# Patient Record
Sex: Female | Born: 1955 | Race: White | Hispanic: No | Marital: Married | Smoking: Former smoker
Health system: Southern US, Community
[De-identification: ages and names within clinical notes are randomized; demographics above are authoritative.]

## PROBLEM LIST (undated history)

## (undated) ENCOUNTER — Emergency Department (HOSPITAL_COMMUNITY): Payer: Medicare HMO | Source: Home / Self Care

## (undated) DIAGNOSIS — M549 Dorsalgia, unspecified: Secondary | ICD-10-CM

## (undated) DIAGNOSIS — I619 Nontraumatic intracerebral hemorrhage, unspecified: Secondary | ICD-10-CM

## (undated) DIAGNOSIS — R51 Headache: Secondary | ICD-10-CM

## (undated) DIAGNOSIS — I10 Essential (primary) hypertension: Secondary | ICD-10-CM

## (undated) DIAGNOSIS — R079 Chest pain, unspecified: Secondary | ICD-10-CM

## (undated) DIAGNOSIS — M1990 Unspecified osteoarthritis, unspecified site: Secondary | ICD-10-CM

## (undated) DIAGNOSIS — I639 Cerebral infarction, unspecified: Secondary | ICD-10-CM

## (undated) DIAGNOSIS — G4730 Sleep apnea, unspecified: Secondary | ICD-10-CM

## (undated) DIAGNOSIS — N200 Calculus of kidney: Secondary | ICD-10-CM

## (undated) HISTORY — PX: LITHOTRIPSY: SUR834

## (undated) HISTORY — PX: ABDOMINAL HYSTERECTOMY: SHX81

## (undated) HISTORY — PX: FEMUR SURGERY: SHX943

## (undated) HISTORY — PX: BACK SURGERY: SHX140

## (undated) HISTORY — PX: TUBAL LIGATION: SHX77

---

## 1999-04-26 ENCOUNTER — Encounter: Payer: Self-pay | Admitting: Specialist

## 1999-04-26 ENCOUNTER — Ambulatory Visit (HOSPITAL_COMMUNITY): Admission: RE | Admit: 1999-04-26 | Discharge: 1999-04-26 | Payer: Self-pay | Admitting: Specialist

## 1999-06-07 ENCOUNTER — Observation Stay (HOSPITAL_COMMUNITY): Admission: RE | Admit: 1999-06-07 | Discharge: 1999-06-08 | Payer: Self-pay | Admitting: Specialist

## 1999-06-07 ENCOUNTER — Encounter: Payer: Self-pay | Admitting: Specialist

## 2000-01-15 ENCOUNTER — Emergency Department (HOSPITAL_COMMUNITY): Admission: EM | Admit: 2000-01-15 | Discharge: 2000-01-15 | Payer: Self-pay | Admitting: Emergency Medicine

## 2000-01-16 ENCOUNTER — Encounter: Payer: Self-pay | Admitting: Emergency Medicine

## 2000-01-16 ENCOUNTER — Ambulatory Visit (HOSPITAL_COMMUNITY): Admission: RE | Admit: 2000-01-16 | Discharge: 2000-01-16 | Payer: Self-pay | Admitting: Emergency Medicine

## 2000-03-18 ENCOUNTER — Emergency Department (HOSPITAL_COMMUNITY): Admission: EM | Admit: 2000-03-18 | Discharge: 2000-03-19 | Payer: Self-pay | Admitting: Emergency Medicine

## 2000-03-19 ENCOUNTER — Encounter: Payer: Self-pay | Admitting: Emergency Medicine

## 2000-03-19 ENCOUNTER — Emergency Department (HOSPITAL_COMMUNITY): Admission: EM | Admit: 2000-03-19 | Discharge: 2000-03-19 | Payer: Self-pay | Admitting: Emergency Medicine

## 2000-03-19 ENCOUNTER — Encounter: Payer: Self-pay | Admitting: Surgery

## 2002-04-15 HISTORY — PX: OTHER SURGICAL HISTORY: SHX169

## 2003-03-08 ENCOUNTER — Inpatient Hospital Stay (HOSPITAL_COMMUNITY): Admission: RE | Admit: 2003-03-08 | Discharge: 2003-03-09 | Payer: Self-pay | Admitting: Specialist

## 2003-10-10 ENCOUNTER — Emergency Department (HOSPITAL_COMMUNITY): Admission: EM | Admit: 2003-10-10 | Discharge: 2003-10-10 | Payer: Self-pay | Admitting: Emergency Medicine

## 2003-10-16 ENCOUNTER — Emergency Department (HOSPITAL_COMMUNITY): Admission: EM | Admit: 2003-10-16 | Discharge: 2003-10-16 | Payer: Self-pay | Admitting: Emergency Medicine

## 2003-12-13 ENCOUNTER — Other Ambulatory Visit: Admission: RE | Admit: 2003-12-13 | Discharge: 2003-12-13 | Payer: Self-pay | Admitting: *Deleted

## 2004-02-28 ENCOUNTER — Encounter (INDEPENDENT_AMBULATORY_CARE_PROVIDER_SITE_OTHER): Payer: Self-pay | Admitting: *Deleted

## 2004-02-28 ENCOUNTER — Observation Stay (HOSPITAL_COMMUNITY): Admission: RE | Admit: 2004-02-28 | Discharge: 2004-02-29 | Payer: Self-pay | Admitting: Gynecology

## 2004-04-10 ENCOUNTER — Emergency Department (HOSPITAL_COMMUNITY): Admission: EM | Admit: 2004-04-10 | Discharge: 2004-04-11 | Payer: Self-pay | Admitting: Emergency Medicine

## 2004-04-15 HISTORY — PX: OTHER SURGICAL HISTORY: SHX169

## 2008-02-28 ENCOUNTER — Emergency Department (HOSPITAL_BASED_OUTPATIENT_CLINIC_OR_DEPARTMENT_OTHER): Admission: EM | Admit: 2008-02-28 | Discharge: 2008-02-28 | Payer: Self-pay | Admitting: Emergency Medicine

## 2010-08-31 NOTE — Discharge Summary (Signed)
NAME:  Heidi [name], Heidi [name]                   ACCOUNT NO.:  [id number]   MEDICAL RECORD NO.:  [id number]          PATIENT TYPE:  OBV   LOCATION:  9311                          FACILITY:  [facility]   PHYSICIAN:  Ivor Costa. Farrel Gobble, M.D. DATE OF BIRTH:  08-05-1955   DATE OF ADMISSION:  02/28/2004  DATE OF DISCHARGE:  02/29/2004                                 DISCHARGE SUMMARY   PRINCIPAL DIAGNOSIS:  Menometrorrhagia.   ADDITIONAL DIAGNOSIS:  Dysfunctional bleeding.   PRINCIPAL PROCEDURE:  Total vaginal hysterectomy.   HISTORY OF PRESENT ILLNESS:  The patient was a 55 year old G5 P2 with a  history of menometrorrhagia that presented after a negative evaluation for  definitive therapy in the form of a vaginal hysterectomy.  She presented on  the afternoon of February 28, 2004 and underwent a total vaginal  hysterectomy under general anesthesia.  Estimated blood loss was  approximated at 100 mL.  The findings were a bulky uterus with an  endocervical polyp; otherwise, was unremarkable.  She was extubated in the  OR, transferred to the PACU, and then the postoperative floor in due  fashion.  Her postoperative course was uncomplicated.  By the morning of  November 16 the patient was tolerating a regular diet, she was having no  nausea or vomiting, she was having minimal vaginal bleeding, and was ready  for discharge.  We had asked, however, that she wait until at least 2 p.m.  as she had an afternoon case, and she was agreeable.  She was rechecked at  approximately 2 p.m. at which point she was dressed and ready to go.  She  was eager for discharge.  The patient was without any complaints.  She was  ambulating, passing gas, and tolerating pain medication.  She was therefore  discharge home with instructions to follow up in our office in 2 weeks.  She  was given a prescription for Tylox one to two q.6h. p.r.n. #20.   DISCHARGE CONDITION:  Stable.   DISCHARGE LABORATORY DATA:  Her white count was 13.5, her  hemoglobin was  12.6, her hematocrit was 36.8, and her platelets were 281.     Trac   THL/MEDQ  D:  02/29/2004  T:  03/01/2004  Job:  [id number]

## 2010-08-31 NOTE — Consult Note (Signed)
[facility]  Patient:    Heidi [name], Heidi [name]                            MRN: [id number] Proc. Date: 03/19/00 Adm. Date:  [id number] Disc. Date: [id number] Attending:  Donnetta Hutching CC:         Gabriel Earing, M.D., [facility]   Consultation Report  ACCOUNT NUMBER:  [id number].  CHIEF COMPLAINT:  Abdominal pain.  CLINICAL HISTORY:  I was asked to see Ms. Arman, who is a 55 year old lady, because of persistent right lower abdominal pain.  Heidi [name] was in the [facility] Emergency Room the night prior to my seeing her and early into the morning with pain that had begun in the right flank and moved into the right lower quadrant a little bit.  She said it was not dissimilar to her prior kidney infections and kidney stones and that was what she thought she had.  I have records from that visit and her urine was negative.  She had a CT scan done which showed an ovarian cyst on the right side and it was thought perhaps her pain was from her ovarian cyst.  While she was there, she was given morphine and Toradol and seemed to be somewhat improved.  She was then followed up by Dr. Gabriel Earing in the office this afternoon and noted to have continued right lower quadrant pain and had a white count of 13,000 and Dr. Krista Blue was suspicious she might have appendicitis.  At the time I saw her, she continued to have some right lower abdominal pain and right flank pain.  She does not have any significant nausea and has had no vomiting.  Her bowels have been working okay.  She says again that this is somewhat similar to some of the episodes she has had with kidney stones.  PAST HISTORY:  Her main past history has been having had multiple problems with kidney stones and several years ago when she lived in [place], she had to have a percutaneous nephrostomy tube placed because of a blockage of her right kidney from stone.  She said she has had at least  three or four kidney infections in the last few years.  ALLERGIES:  The patient has allergies to PENICILLIN and CIPRO.  MEDICATIONS:  She is on some antihypertensives.  REVIEW OF SYSTEMS:  HEENT:  Basically negative.  CHEST:  No cough or shortness of breath.  HEART:  History of hypertension.  ABDOMEN:  Negative except for HPI.  She has never had an appendectomy or other significant abdominal procedure.  EXTREMITIES:  Negative.  PHYSICAL EXAMINATION  GENERAL:  Patient is a somewhat overweight but otherwise healthy-appearing female who is in no acute distress.  VITAL SIGNS:  Unremarkable and she is afebrile in the emergency department.  HEENT:  Head is normocephalic.  Eyes nonicteric.  Pupils are round and regular.  NECK:  Supple.  No masses or thyromegaly.  LUNGS:  Clear to auscultation.  HEART:  Regular.  No murmurs, rubs, or gallops.  ABDOMEN:  Soft although she has a little tenderness laterally in her right lower quadrant.  There is no rebound and no guarding.  Bowel sounds are normal.  PELVIC/RECTAL:  Not done.  They were apparently negative by Dr. Krista Blue a few hours ago and not repeated.  EXTREMITIES:  No cyanosis or edema.  IMPRESSION:  My initial feeling was that this was  probably not appendicitis but because of persistent symptoms and the fact that more of her tenderness seemed to be right lower abdomen, I went ahead and obtained a CT scan, this one done with oral and intravenous contract; last nights that was simply done for kidney stone was done without contrast.  I have reviewed those films with ______ and basically she has no evidence of acute appendicitis or any other inflammatory process in the abdominal cavity. She does have a small right ovarian cyst but there is no free fluid to suggest that it has ruptured.  She does have prominence of the right collecting system, both the kidney and ureter dilated down to the bladder on the right side as compared to  the left.  This is minimal but clearly asymmetric.  There was no evidence of stone seen but she did have intravenous contrast, making that evaluation a little difficult.  The area of the appendix is clearly negative for any inflammatory process and we appear to see the appendix and it appears to be normal.  I think this patient most likely has symptoms secondary to some urinary tract process.  I wonder if she has been getting recurrent infections and recurrent pyelonephritis secondary to some stenosis or problem at ureterovesical area.  PLAN:  I am going to empirically start her on some Septra DS, since that is what she has taken in the past successfully for her urinary infections.  She is going to follow up tomorrow with Dr. Krista Blue.  I suggested at some point she may want to have a urologic opinion and a cystoscopy done. DD:  03/19/00 TD:  03/20/00 Job: [id number]

## 2010-08-31 NOTE — Op Note (Signed)
NAME:  [name], [name]                         ACCOUNT NO.:  [id number]   MEDICAL RECORD NO.:  [id number]                   PATIENT TYPE:  INP   LOCATION:  X009                                 FACILITY:  [facility]   PHYSICIAN:  Jene Every, M.D.                 DATE OF BIRTH:  02-Mar-1956   DATE OF PROCEDURE:  03/08/2003  DATE OF DISCHARGE:                                 OPERATIVE REPORT   PREOPERATIVE DIAGNOSES:  1. Spinal stenosis.  2. Epidural fibrosis, L5-S1, right.   POSTOPERATIVE DIAGNOSES:  1. Spinal stenosis.  2. Epidural fibrosis, L5-S1, right.   PROCEDURES PERFORMED:  1. Re-do decompression or the L5 and S1 nerve roots with neurolysis at the     L5 and S1 nerve roots.  2. Foraminotomy of L5.  3. Lateral recess decompression at L5-S1.   ANESTHESIA:  General.   SURGEON:  Javier Docker, M.D.   ASSISTANT:  Roma Schanz, P.A.   BRIEF HISTORY AND INDICATIONS:  A 55 year old with postop L5 radiculopathy,  neural foraminal stenosis secondary to progressive disk degeneration, facet  hypertrophy into the right foramen.  She had no back pain, no instability to  flexion and extension, and it was felt that decompression by hemifacetectomy  would decompress the L5 nerve root.  Risks and benefits discussed including  bleeding, infection, damage to vascular structures, CSF leakage, epidural  fibrosis, need for fusion in the future, etc.  Also, no return of the nerve  root.   TECHNIQUE:  The patient in supine position.  After the induction of adequate  general anesthesia, 500 vancomycin IV, she was placed prone on the [place]  frame.  All bony prominences well-padded.  Lumbar region is prepped and  draped in the usual sterile fashion.  Previous surgical incision was  utilized after she was placed in the [place] frame.  Subcutaneous tissue was  dissected.  Electrocautery was utilized to achieve hemostasis.  The  dorsolumbar fascia identified and divided in line with the  skin incision.  Paraspinous muscle elevated from the lamina of L5 and S1.  McCullough  retractor is placed.  Operating room microscope draped and was brought into  the surgical field after Penfield 4 was placed in the interlaminar space,  confirming the L5-S1 disk space.  There was extensive epidural fibrosis and  hypertrophy of the L5-S1 facet.  High-speed bur was utilized to perform a  hemilaminotomy to the caudad edge of L5 and then the inferior articulating  process.  That was further used to remove the superior articulating facet.  There was severe stenosis at L5-S1.  We used intraoperative neuro  monitoring.  A 14 was noted on the decompression free running neuro monitor.  Under the operating microscope, we performed a foraminotomy of L5 and  removed the superior portion of the articulating facet of S1.  This was  decompressed out into the foramen.  There was severe compression  of the 5  root secondary to the facet and the ligamentum flavum.  This was removed,  decompressing.  The nerve was found to be erythematous, edematous, with  raised tissue and severely constricted.  I performed a foraminotomy of S1 as  well and mobilized the S1 nerve root, performing neurolysis of a tethering  epidural venous plexus over both nerves.  Hockey-stick probe placed in the  foramen of S1 and found to be widely patent and into L5 following the  decompression, was patent with a hockey-stick probe.  There was no  instability and no apposition of pedicles, so I felt this was adequate  decompression of the 5 root without a need for interbody fusion or  instrumentation.  The wound was copiously irrigated.  Neuro monitoring  indicated continued elevated EMG'S.  No evidence of CSF leakage or active  bleeding.  I copiously irrigated the wound.  There was no disk rupture  noted.  Bone wax over the cancellous surfaces.  Thrombin-soaked Gelfoam in  the laminotomy defect.  Removed the [facility] retractor.   Paraspinous  muscles inspected with no evidence of active bleeding.  Dorsolumbar fascia  reapproximated with #1 Vicryl interrupted figure-of-eight sutures,  subcutaneous tissue reapproximated with 2-0 Vicryl simple sutures.  Skin was  reapproximated with staples.  Wound was dressed sterilely placed supine on  the hospital bed, extubated without difficulty, and transported to the  recovery room in satisfactory condition.   The patient tolerated the procedure well with no apparent complications.                                               Jene Every, M.D.    Cordelia Pen  D:  03/08/2003  T:  03/08/2003  Job:  [id number]

## 2010-08-31 NOTE — Op Note (Signed)
NAME:  Gopaul, Deisi                   ACCOUNT NO.:  [id number]   MEDICAL RECORD NO.:  [id number]          PATIENT TYPE:  OBV   LOCATION:  9311                          FACILITY:  [facility]   PHYSICIAN:  Ivor Costa. Farrel Gobble, M.D. DATE OF BIRTH:  02-20-1956   DATE OF PROCEDURE:  02/28/2004  DATE OF DISCHARGE:                                 OPERATIVE REPORT   PREOPERATIVE DIAGNOSES:  1.  Dysfunctional bleeding.  2.  Menorrhagia.   POSTOPERATIVE DIAGNOSES:  1.  Dysfunctional bleeding.  2.  Menorrhagia.   PROCEDURE:  Total vaginal hysterectomy.   SURGEON:  Ivor Costa. Farrel Gobble, M.D.   ASSISTANT:  Gaetano Hawthorne. Lily Peer, M.D.   ANESTHESIA:  General.   IV FLUIDS:  2 liters lactated Ringer's.   ESTIMATED BLOOD LOSS:  Less than 100 mL.   URINE OUTPUT:  Not measured but clear.   FINDINGS:  Bulky uterus with an endocervical polyp.  There is a left clear  ovarian cyst.   COMPLICATIONS:  None.   PATHOLOGY:  Uterus and cervix.   DESCRIPTION OF PROCEDURE:  The patient was taken to the operating room and  general anesthesia was induced and placed in the dorsal lithotomy position,  prepped and draped in the usual sterile fashion.  A sterile weighted  speculum was placed in the vagina, the cervix was visualized and stabilized  with a single tooth tenaculum after which 20 mL of  0.25% Marcaine with  epinephrine was injected circumferentially around the cervix. The vaginal  mucosa was then incised circumferentially and bluntly dissected off. The  cervix was deviated anteriorly and a posterior colpotomy was performed after  which a long sterile weighted speculum was placed in the posterior cul-de-  sac.  The uterosacral ligaments were then clamped, transected and suture  ligated with #0 Vicryl, these were held for later identification. The  beginnings of the cardinal ligament were also similarly transected and  suture ligated times 2 with #0 Vicryl. The vaginal mucosa continued to be  dissected off  anteriorly and eventually and anterior colpotomy was  performed, placement in the abdominal cavity was confirmed and the retractor  was then placed in the anterior cul-de-sac. The uterine vessels were then  clamped, transected and suture ligated x2. This was done bilaterally and  dissection carried up slightly more cephalad at which point we were able to  invert the specimen and the tuboovarian round ligament complexes were then  cross clamped, transected and the specimen was passed off the field.  A free  tie of #0 Vicryl was then placed followed by a suture ligature of the upper  pedicle and this was held for later identification.  This was done  bilaterally. There was some bleeding noted more cephalad on the left  ligament.  A Heaney clamp was then placed and another suture ligature was  placed, it was felt to be hemostatic and the previous pedicle was trimmed  off. This was held for identification.  The peritoneum was then plicated to  the vaginal mucosa from above the uterosacral ligament to above the  uterosacral ligament.  The anterior peritoneum was identified and held with  an Allis clamp to the vaginal mucosa anteriorly. The pelvis was irrigated  with copious amounts of warm saline. Some bleeding was noted and this later  turned out to be again from the adnexa on the left hand side. Again a clamp  was placed, it was noted to be hemostatic. Two free ties of Endoloop Vicryl  were placed. The pedicle was then noted to be hemostatic, the pelvis was  irrigated again with copious amounts of warm saline and reinspection of all  pedicles assured [place] of hemostasis. A short weighted speculum was then placed  in the vagina. A posterior colpotomy stitch was performed with 2-0 Vicryl  and held to be tied later. The vaginal mucosa was then plicated anterior to  posterior incorporating the underlying peritoneum from uterosacral ligament  to uterosacral ligament. The culdoplasty stitch was then  tied.  The cuff was  noted to be hemostatic. A Foley was placed, clear urine was noted.  The  patient tolerated the procedure well. Sponge, lap and needle counts were  correct x2 and she was transferred to the PACU in stable condition.     Trac   THL/MEDQ  D:  02/28/2004  T:  02/29/2004  Job:  [id number]

## 2010-08-31 NOTE — H&P (Signed)
NAME:  [name], [name]                         ACCOUNT NO.:  [id number]   MEDICAL RECORD NO.:  [id number]                   PATIENT TYPE:  INP   LOCATION:  0468                                 FACILITY:  [facility]   PHYSICIAN:  Jene Every, M.D.                 DATE OF BIRTH:  Feb 21, 1956   DATE OF ADMISSION:  03/08/2003  DATE OF DISCHARGE:  03/09/2003                                HISTORY & PHYSICAL   CHIEF COMPLAINT:  Right lower extremity pain.   HISTORY:  Heidi [name] is a 55 year old female who was referred to Jene Every, M.D. by Dr. Ethelene Hal for evaluation of a right foot drop with associated  right lower extremity pain.  The patient noted onset of symptoms in October  which has gradually gotten worse with the developing pain and numbness in  the lower extremity.  The patient underwent an MRI which showed degenerative  disk disease L5-S1 with a neural foraminal stenosis L5 on the right.  She  has had a previous lumbar diskectomy at L5-S1 by Dr. Montez Morita in the past.  The patient did undergo injection L4-5 and 5-1 by Dr. Ethelene Hal but these  symptoms persisted.  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  Due to the fact that patient has  failed conservative treatment, it is felt she would benefit from a  decompression of the foramina of L5 on the right.  The risks and benefits of  the surgery were discussed with the patient in detail and she wishes to  proceed.   PAST MEDICAL HISTORY:  Hypertension.   CURRENT MEDICATIONS:  1. Toprol XL 100 mg p.o. daily.  2. HCTZ 25 mg one p.o. daily.  3. Celebrex one p.o. daily.  4. Vicodin p.r.n. pain.   ALLERGIES:  PENICILLIN which causes rash.   PAST SURGICAL HISTORY:  1. Tubal ligation.  2. Lithotripsy.  3. Microdiskectomy.  4. ACL repair on the left.  5. Removal of lipoma.   SOCIAL HISTORY:  The patient is married.  She smokes approximately half a  pack per day for over 10 years.  She  occasionally drinks alcohol.  Husband  will be her caregiver following surgery.   FAMILY HISTORY:  Father history of coronary artery disease.  Mother  diabetes, hypertension, osteoarthritis.   REVIEW OF SYSTEMS:  GENERAL:  The patient denies any fevers, chills, night  sweats, or bleeding tendencies.  CNS:  No blurred, double vision, seizure,  headache, or paralysis.  RESPIRATORY:  No shortness of breath, productive  cough, or hemoptysis.  CARDIOVASCULAR:  No chest pain, angina, orthopnea.  GENITOURINARY:  No dysuria, hematuria, or discharge.  GASTROINTESTINAL:  No  nausea, vomiting, diarrhea, constipation, melena, bloody stools.  MUSCULOSKELETAL:  Pertinent to HPI.   PHYSICAL EXAMINATION:  VITAL SIGNS:  Pulse 60, respiratory 12, blood  pressure 118/78.  GENERAL:  This is a well-developed,  well-nourished 55 year old female in  mild distress.  She does walk with an antalgic gait and right foot drop.  HEENT:  Atraumatic, normocephalic.  Pupils are equal, round, and reactive to  light.  EOMs intact.  NECK:  Supple.  No lymphadenopathy.  CHEST:  Clear to auscultation bilaterally.  No rhonchi, wheezes, or rales.  BREASTS:  Not examined.  Not pertinent to HPI.  GENITOURINARY:  Not examined.  Not pertinent to HPI.  HEART:  Regular rate and rhythm without murmurs, rubs, or gallops.  ABDOMEN:  Soft, nontender, nondistended.  Bowel sounds x4.  SKIN:  No rashes or lesions are noted.  EXTREMITIES:  The patient is unable to heel walk.  Straight leg raise on the  right produces buttocks, posterior thigh, and calf pain.  EHL is __________,  tibialis is 5/5.  Plantar flexion is 5/5.  The patient does have decreased  sensation L5 dermatome.   IMPRESSION:  Spinal stenosis at L5-S1 on the right.   PLAN:  The patient will undergo a lateral recess decompression and  foraminotomy at L5 on the right by Jene Every, M.D.     Roma Schanz, P.A.                   Jene Every, M.D.    CS/MEDQ   D:  03/15/2003  T:  03/15/2003  Job:  [id number]

## 2010-08-31 NOTE — H&P (Signed)
NAME:  Heidi [name], Heidi [name]                   ACCOUNT NO.:  [id number]   MEDICAL RECORD NO.:  [id number]          PATIENT TYPE:  AMB   LOCATION:  SDC                           FACILITY:  [facility]   PHYSICIAN:  Ivor Costa. Farrel Gobble, M.D. DATE OF BIRTH:  June 09, 1955   DATE OF ADMISSION:  DATE OF DISCHARGE:                                HISTORY & PHYSICAL   CHIEF COMPLAINT:  Menometrorrhagia and dysfunction bleeding.   HISTORY OF PRESENT ILLNESS:  The patient is a 55 year old G5, P2 with a  history of menometrorrhagia. The patient states that her periods are now  increased in frequency, bleeding every 12 to 21 days and lasting for 4 to 12  days depending upon the cycle. She has been evaluated with normal TSH. Her  FSH is the upper limits of normal, and her CBC was normal. The patient had  an endometrial biopsy performed because of the irregularity of her cycle  which came back as benign secretory endometrium. Her ultrasound shows her  uterus to be bulky at 10 x 6 x 6.2 with an echogenic lining. The patient has  had normal Pap smears. From a GYN history, she has had 2 vaginal deliveries,  26 and 34 years ago. She has a history of recurrent miscarriage at 4-1/2, 5,  and 8 months respectively. She is contraception with a tubal ligation.   PAST MEDICAL HISTORY:  1.  Significant for hypercholesterolemia.  2.  Seasonal allergies.  3.  Low back pain.  4.  As well as some depression.   PAST SURGICAL HISTORY:  She had a tubal ligation in 1978.   FAMILY HISTORY:  Negative.   MEDICATIONS:  1.  Toprol-XL 100 mg q.d.  2.  Lipitor.  3.  Zyrtec.  4.  Flonase.  5.  Cymbalta.  6.  Vicodin ES.   ALLERGIES:  PENICILLIN, TERRAMYCIN, VERAPAMIL, and PHENERGAN.   PHYSICAL EXAMINATION:  GENERAL:  She is well appearing in no acute distress.  HEART:  Regular rate.  LUNGS:  Clear to auscultation.  ABDOMEN:  Obese, soft, and nontender.  GYNECOLOGICAL EXAM:  She has normal external genitalia. The BUS is negative.  The  vagina was parous,  pink, and moist with discharge which was subsequently negative. On bimanual  exam, there is no cervical motion tenderness. Uterus is mobile and  nontender.   ASSESSMENT:  Dysfunctional bleeding for TVH. All questions were addressed.     Trac   THL/MEDQ  D:  02/13/2004  T:  02/13/2004  Job:  [id number]

## 2011-01-16 LAB — COMPREHENSIVE METABOLIC PANEL
ALT: 7
AST: 21
Albumin: 4.8
Alkaline Phosphatase: 85
BUN: 9
CO2: 30
Calcium: 9.6
Chloride: 102
Creatinine, Ser: 0.7
GFR calc Af Amer: >60
GFR calc non Af Amer: >60
Glucose, Bld: 81
Potassium: 3.6
Sodium: 142
Total Bilirubin: 0.5
Total Protein: 8.2

## 2011-01-16 LAB — DIFFERENTIAL
Basophils Absolute: 0.1
Basophils Relative: 2 — ABNORMAL HIGH
Eosinophils Absolute: 0.2
Eosinophils Relative: 2
Lymphocytes Relative: 38
Lymphs Abs: 3.5
Monocytes Absolute: 0.6
Monocytes Relative: 6
Neutro Abs: 4.8
Neutrophils Relative %: 52

## 2011-01-16 LAB — URINALYSIS, ROUTINE W REFLEX MICROSCOPIC
Bilirubin Urine: NEGATIVE
Glucose, UA: NEGATIVE
Ketones, ur: 15 — AB
Nitrite: POSITIVE — AB
Protein, ur: NEGATIVE
Specific Gravity, Urine: 1.023
Urobilinogen, UA: 1
pH: 6

## 2011-01-16 LAB — CBC
HCT: 47.4 — ABNORMAL HIGH
Hemoglobin: 15.8 — ABNORMAL HIGH
MCHC: 33.3
MCV: 87.8
Platelets: 239
RBC: 5.4 — ABNORMAL HIGH
RDW: 12.3
WBC: 9.2

## 2011-01-16 LAB — URINE MICROSCOPIC-ADD ON

## 2011-01-16 LAB — URINE CULTURE: Colony Count: >=100000

## 2011-06-25 ENCOUNTER — Emergency Department (HOSPITAL_BASED_OUTPATIENT_CLINIC_OR_DEPARTMENT_OTHER)
Admission: EM | Admit: 2011-06-25 | Discharge: 2011-06-25 | Disposition: A | Discharge status: Home Health | Payer: Self-pay | Attending: Emergency Medicine | Admitting: Emergency Medicine

## 2011-06-25 ENCOUNTER — Emergency Department (INDEPENDENT_AMBULATORY_CARE_PROVIDER_SITE_OTHER): Payer: Self-pay

## 2011-06-25 DIAGNOSIS — W460XXA Contact with hypodermic needle, initial encounter: Secondary | ICD-10-CM | POA: Insufficient documentation

## 2011-06-25 DIAGNOSIS — M795 Residual foreign body in soft tissue: Secondary | ICD-10-CM

## 2011-06-25 DIAGNOSIS — S60459A Superficial foreign body of unspecified finger, initial encounter: Secondary | ICD-10-CM | POA: Insufficient documentation

## 2011-06-25 DIAGNOSIS — Z0389 Encounter for observation for other suspected diseases and conditions ruled out: Secondary | ICD-10-CM

## 2011-06-25 DIAGNOSIS — W278XXA Contact with other nonpowered hand tool, initial encounter: Secondary | ICD-10-CM

## 2011-06-25 MED ORDER — LIDOCAINE HCL (PF) 1 % IJ SOLN
INTRAMUSCULAR | Status: AC
  Administered 2011-06-25: 04:00:00
  Filled 2011-06-25: qty 5

## 2011-06-25 MED ORDER — CLINDAMYCIN HCL 150 MG PO CAPS: 150.0000 mg | ORAL_CAPSULE | Freq: Four times a day (QID) | ORAL | Status: AC

## 2011-06-25 NOTE — Discharge Instructions (Signed)
Foreign Body Keep wound clean dry and covered. I recommend that you take antibiotics as prescribed. If you choose not to take them, please be evaluated by your doctor in 2 days for recheck. Return here or be evaluated immediately for any evidence of infection.  When the skin is cut or punctured and some object is left in the tissue under the skin, that object is called a "foreign body". A foreign body could be a wood splinter, a thorn, a sliver of metal, a shard of glass, a cactus needle or the tip of a pencil. In most instances, your caregiver will recommend that the foreign body be removed. If it is not removed, infection, abscess formation, an allergic reaction, chronic pain and disability can occur over time. Sometimes, foreign bodies (particularly very small ones) can be difficult to locate. Your caregiver may recommend x-rays or ultrasound imaging to help find them. If removal is not successful, there may be a need to see a surgeon who might suggest further exploration in the operating room. Occasionally, tiny bits of metallic foreign material (such as shrapnel) are not removed, if it is felt that there would be no harm in leaving them untouched. HOME CARE INSTRUCTIONS  Rest the injured area and keep it elevated until all the pain and swelling are gone.   You will need a tetanus vaccination if you have not had one in the last 5 years.   Return to this facility, see your caregiver or follow-up as instructed in 2 days.  SEEK IMMEDIATE MEDICAL CARE IF:   You develop increasing redness or swelling of the skin near the wound.   You develop drainage of pus from the wound.   You have persistent pain or loss of motion.   You have red streaks extending above or below the wound location.  MAKE SURE YOU:   Understand these instructions.   Will watch your condition.   Will get help right away if you are not doing well or get worse.  Document Released: 04/01/2005 Document Revised: 03/21/2011  Document Reviewed: 03/03/2009 [facility] Patient Information 2012 [place], [place].

## 2011-06-25 NOTE — ED Provider Notes (Signed)
History     CSN: [id number]  Arrival date & time 06/25/11  0865   First MD Initiated Contact with Patient 06/25/11 0411      Chief Complaint  Patient presents with  . Finger Injury    (Consider location/radiation/quality/duration/timing/severity/associated sxs/prior treatment) The history is provided by the patient.   foreign body left thumb. Patient was cleaning out a drawer at home and unintentionally stuck her left thumb with an EpiPen. She was able to cut the needle off without getting any medication injected. She states the EpiPen was in its sterile container. She was unable to pull the needle out of her thumb. She presents here for evaluation. Pain is sharp in quality. Moderate in severity. No radiation. Hurts to touch otherwise no alleviating factors. Pain constant since onset.  No past medical history on file.  No past surgical history on file.  No family history on file.  History  Substance Use Topics  . Smoking status: Not on file  . Smokeless tobacco: Not on file  . Alcohol Use: Not on file    OB History    No data available      Review of Systems  Constitutional: Negative for fever and chills.  HENT: Negative for neck pain and neck stiffness.   Eyes: Negative for pain.  Respiratory: Negative for shortness of breath.   Cardiovascular: Negative for chest pain.  Gastrointestinal: Negative for abdominal pain.  Genitourinary: Negative for dysuria.  Musculoskeletal: Negative for back pain.  Skin: Positive for wound. Negative for rash.  Neurological: Negative for headaches.  All other systems reviewed and are negative.    Allergies  Penicillins  Home Medications  No current outpatient prescriptions on file.  There were no vitals taken for this visit.  Physical Exam  Constitutional: She is oriented to person, place, and time. She appears well-developed and well-nourished.  HENT:  Head: Normocephalic and atraumatic.  Eyes: EOM are normal. Pupils are  equal, round, and reactive to light.  Neck: Trachea normal. Neck supple.  Cardiovascular: Intact distal pulses and normal pulses.   Pulses:      Radial pulses are 2+ on the right side, and 2+ on the left side.  Pulmonary/Chest: Effort normal. No respiratory distress. She has no rhonchi.  Abdominal: Normal appearance. There is no CVA tenderness and negative Murphy's sign.  Musculoskeletal:       Left thumb: Needle implanted in pad of left thumb and into that needle is visualized.  Bleeding controlled. Distal cap refill intact. No palor or coolness to touch.   Neurological: She is alert and oriented to person, place, and time. She has normal strength. No cranial nerve deficit or sensory deficit. GCS eye subscore is 4. GCS verbal subscore is 5. GCS motor subscore is 6.  Skin: Skin is warm and dry. No rash noted.  Psychiatric: Her speech is normal.       Cooperative and appropriate    ED Course  FOREIGN BODY REMOVAL Date/Time: 06/25/2011 4:18 AM Performed by: Sunnie Nielsen Authorized by: Sunnie Nielsen Consent: Verbal consent obtained. Risks and benefits: risks, benefits and alternatives were discussed Consent given by: patient Patient understanding: patient states understanding of the procedure being performed Patient consent: the patient's understanding of the procedure matches consent given Procedure consent: procedure consent matches procedure scheduled Required items: required blood products, implants, devices, and special equipment available Patient identity confirmed: verbally with patient Time out: Immediately prior to procedure a "time out" was called to verify the correct patient, procedure,  equipment, support staff and site/side marked as required. Intake: Left thumb. Anesthesia: digital block Local anesthetic: lidocaine 1% without epinephrine Anesthetic total: 4 ml Patient cooperative: yes Complexity: complex 1 objects recovered. Objects recovered: Needle tip Post-procedure  assessment: foreign body removed Patient tolerance: Patient tolerated the procedure well with no immediate complications.   Wound irrigated normal saline and sterile dressing applied.   X-ray left thumb obtained and reviewed by myself. No retained foreign body.   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 Bacitracin ointment and sterile dressing applied to wound. Patient verbalizes understanding infection precautions. X-ray obtained and reviewed post procedure to ensure no retained foreign body        Sunnie Nielsen, MD 06/25/11 [phone]

## 2011-06-25 NOTE — ED Notes (Signed)
Metal needle protruding out of left thumb

## 2011-06-25 NOTE — ED Notes (Addendum)
Pt sts old epi  pen injected into thumb

## 2012-06-16 ENCOUNTER — Encounter (HOSPITAL_COMMUNITY)
Admission: EM | Disposition: A | Discharge status: Home / Self Care | Payer: Self-pay | Source: Home / Self Care | Attending: Internal Medicine

## 2012-06-16 ENCOUNTER — Observation Stay (HOSPITAL_BASED_OUTPATIENT_CLINIC_OR_DEPARTMENT_OTHER)
Admission: EM | Admit: 2012-06-16 | Discharge: 2012-06-17 | Disposition: A | Discharge status: Home / Self Care | Payer: No Typology Code available for payment source | Attending: Internal Medicine | Admitting: Internal Medicine

## 2012-06-16 ENCOUNTER — Encounter (HOSPITAL_BASED_OUTPATIENT_CLINIC_OR_DEPARTMENT_OTHER): Payer: Self-pay | Admitting: Emergency Medicine

## 2012-06-16 ENCOUNTER — Emergency Department (HOSPITAL_BASED_OUTPATIENT_CLINIC_OR_DEPARTMENT_OTHER): Payer: No Typology Code available for payment source

## 2012-06-16 DIAGNOSIS — F3289 Other specified depressive episodes: Secondary | ICD-10-CM | POA: Insufficient documentation

## 2012-06-16 DIAGNOSIS — M48061 Spinal stenosis, lumbar region without neurogenic claudication: Secondary | ICD-10-CM | POA: Insufficient documentation

## 2012-06-16 DIAGNOSIS — E876 Hypokalemia: Secondary | ICD-10-CM | POA: Insufficient documentation

## 2012-06-16 DIAGNOSIS — G8929 Other chronic pain: Secondary | ICD-10-CM | POA: Insufficient documentation

## 2012-06-16 DIAGNOSIS — I214 Non-ST elevation (NSTEMI) myocardial infarction: Principal | ICD-10-CM | POA: Insufficient documentation

## 2012-06-16 DIAGNOSIS — R079 Chest pain, unspecified: Secondary | ICD-10-CM

## 2012-06-16 DIAGNOSIS — F329 Major depressive disorder, single episode, unspecified: Secondary | ICD-10-CM | POA: Insufficient documentation

## 2012-06-16 DIAGNOSIS — E785 Hyperlipidemia, unspecified: Secondary | ICD-10-CM

## 2012-06-16 DIAGNOSIS — I10 Essential (primary) hypertension: Secondary | ICD-10-CM

## 2012-06-16 DIAGNOSIS — M545 Low back pain, unspecified: Secondary | ICD-10-CM

## 2012-06-16 DIAGNOSIS — R0602 Shortness of breath: Secondary | ICD-10-CM | POA: Insufficient documentation

## 2012-06-16 DIAGNOSIS — M549 Dorsalgia, unspecified: Secondary | ICD-10-CM | POA: Diagnosis present

## 2012-06-16 DIAGNOSIS — Z720 Tobacco use: Secondary | ICD-10-CM | POA: Diagnosis present

## 2012-06-16 DIAGNOSIS — F17200 Nicotine dependence, unspecified, uncomplicated: Secondary | ICD-10-CM | POA: Insufficient documentation

## 2012-06-16 HISTORY — DX: Calculus of kidney: N20.0

## 2012-06-16 HISTORY — DX: Chest pain, unspecified: R07.9

## 2012-06-16 HISTORY — DX: Unspecified osteoarthritis, unspecified site: M19.90

## 2012-06-16 HISTORY — DX: Essential (primary) hypertension: I10

## 2012-06-16 HISTORY — PX: LEFT HEART CATHETERIZATION WITH CORONARY ANGIOGRAM: SHX5451

## 2012-06-16 HISTORY — DX: Headache: R51

## 2012-06-16 HISTORY — DX: Dorsalgia, unspecified: M54.9

## 2012-06-16 LAB — HEPATIC FUNCTION PANEL
AST: 13 U/L (ref 0–37)
Albumin: 4.3 g/dL (ref 3.5–5.2)
Alkaline Phosphatase: 89 U/L (ref 39–117)
Bilirubin, Direct: <0.1 mg/dL (ref 0.0–0.3)
Total Bilirubin: 0.4 mg/dL (ref 0.3–1.2)

## 2012-06-16 LAB — BASIC METABOLIC PANEL
BUN: 9 mg/dL (ref 6–23)
Calcium: 9.3 mg/dL (ref 8.4–10.5)
Chloride: 98 mEq/L (ref 96–112)
Creatinine, Ser: 0.7 mg/dL (ref 0.50–1.10)
GFR calc Af Amer: >90 mL/min (ref >90)

## 2012-06-16 LAB — CBC
HCT: 50.7 % — ABNORMAL HIGH (ref 36.0–46.0)
MCH: 30.5 pg (ref 26.0–34.0)
MCHC: 34.5 g/dL (ref 30.0–36.0)
MCV: 88.5 fL (ref 78.0–100.0)
RDW: 13.7 % (ref 11.5–15.5)
WBC: 14 10*3/uL — ABNORMAL HIGH (ref 4.0–10.5)

## 2012-06-16 LAB — LIPID PANEL
Cholesterol: 159 mg/dL (ref 0–200)
LDL Cholesterol: 76 mg/dL (ref 0–99)
Triglycerides: 251 mg/dL — ABNORMAL HIGH (ref <150)

## 2012-06-16 LAB — TSH: TSH: 3.164 u[IU]/mL (ref 0.350–4.500)

## 2012-06-16 LAB — APTT: aPTT: 34 seconds (ref 24–37)

## 2012-06-16 LAB — TROPONIN I
Troponin I: 1.03 ng/mL (ref <0.30)
Troponin I: 3.88 ng/mL (ref <0.30)

## 2012-06-16 SURGERY — LEFT HEART CATHETERIZATION WITH CORONARY ANGIOGRAM
Anesthesia: LOCAL

## 2012-06-16 MED ORDER — HEPARIN (PORCINE) IN NACL 100-0.45 UNIT/ML-% IJ SOLN
900.0000 [IU]/h | INTRAMUSCULAR | Status: DC
Start: 2012-06-16 — End: 2012-06-16
  Administered 2012-06-16: 900 [IU]/h via INTRAVENOUS
  Filled 2012-06-16: qty 250

## 2012-06-16 MED ORDER — MORPHINE SULFATE 2 MG/ML IJ SOLN: 2.0000 mg | INTRAMUSCULAR | Status: DC | PRN

## 2012-06-16 MED ORDER — POTASSIUM CHLORIDE CRYS ER 20 MEQ PO TBCR
40.0000 meq | EXTENDED_RELEASE_TABLET | ORAL | Status: AC
  Administered 2012-06-16 (×2): 40 meq via ORAL
  Filled 2012-06-16 (×2): qty 2

## 2012-06-16 MED ORDER — ONDANSETRON HCL 4 MG/2ML IJ SOLN: 4.0000 mg | Freq: Four times a day (QID) | INTRAMUSCULAR | Status: DC | PRN

## 2012-06-16 MED ORDER — DIAZEPAM 5 MG PO TABS: 5.0000 mg | ORAL_TABLET | ORAL | Status: AC

## 2012-06-16 MED ORDER — REGADENOSON 0.4 MG/5ML IV SOLN
0.4000 mg | Freq: Once | INTRAVENOUS | Status: DC
  Filled 2012-06-16: qty 5

## 2012-06-16 MED ORDER — PANTOPRAZOLE SODIUM 40 MG PO TBEC
40.0000 mg | DELAYED_RELEASE_TABLET | Freq: Every day | ORAL | Status: DC
  Administered 2012-06-16 – 2012-06-17 (×2): 40 mg via ORAL
  Filled 2012-06-16 (×2): qty 1

## 2012-06-16 MED ORDER — ROPINIROLE HCL 1 MG PO TABS
2.0000 mg | ORAL_TABLET | Freq: Every day | ORAL | Status: DC
  Administered 2012-06-16: 21:00:00 2 mg via ORAL
  Filled 2012-06-16 (×3): qty 2

## 2012-06-16 MED ORDER — MORPHINE SULFATE 4 MG/ML IJ SOLN
INTRAMUSCULAR | Status: AC
  Filled 2012-06-16: qty 1

## 2012-06-16 MED ORDER — SODIUM CHLORIDE 0.9 % IV SOLN: 1.0000 mL/kg/h | INTRAVENOUS | Status: DC

## 2012-06-16 MED ORDER — HEPARIN (PORCINE) IN NACL 2-0.9 UNIT/ML-% IJ SOLN
INTRAMUSCULAR | Status: AC
  Filled 2012-06-16: qty 2000

## 2012-06-16 MED ORDER — DIAZEPAM 5 MG PO TABS
ORAL_TABLET | ORAL | Status: AC
  Administered 2012-06-16: 5 mg
  Filled 2012-06-16: qty 1

## 2012-06-16 MED ORDER — MORPHINE SULFATE 4 MG/ML IJ SOLN
4.0000 mg | Freq: Once | INTRAMUSCULAR | Status: AC
  Administered 2012-06-16: 4 mg via INTRAVENOUS

## 2012-06-16 MED ORDER — OXYCODONE HCL 5 MG PO TABS
5.0000 mg | ORAL_TABLET | ORAL | Status: DC | PRN
  Administered 2012-06-17: 5 mg via ORAL
  Filled 2012-06-16: qty 1

## 2012-06-16 MED ORDER — FENTANYL CITRATE 0.05 MG/ML IJ SOLN
INTRAMUSCULAR | Status: AC
  Filled 2012-06-16: qty 2

## 2012-06-16 MED ORDER — SODIUM CHLORIDE 0.9 % IV SOLN: 1.0000 mL/kg/h | INTRAVENOUS | Status: AC

## 2012-06-16 MED ORDER — VERAPAMIL HCL 2.5 MG/ML IV SOLN
INTRAVENOUS | Status: AC
  Filled 2012-06-16: qty 2

## 2012-06-16 MED ORDER — MORPHINE SULFATE 4 MG/ML IJ SOLN
4.0000 mg | Freq: Once | INTRAMUSCULAR | Status: AC
  Administered 2012-06-16: 4 mg via INTRAVENOUS
  Filled 2012-06-16: qty 1

## 2012-06-16 MED ORDER — ASPIRIN 81 MG PO CHEW
81.0000 mg | CHEWABLE_TABLET | Freq: Every day | ORAL | Status: DC
  Administered 2012-06-17: 10:00:00 81 mg via ORAL
  Filled 2012-06-16: qty 1

## 2012-06-16 MED ORDER — GI COCKTAIL ~~LOC~~
30.0000 mL | Freq: Once | ORAL | Status: AC
  Administered 2012-06-16: 30 mL via ORAL
  Filled 2012-06-16: qty 30

## 2012-06-16 MED ORDER — SODIUM CHLORIDE 0.9 % IJ SOLN: 3.0000 mL | Freq: Two times a day (BID) | INTRAMUSCULAR | Status: DC

## 2012-06-16 MED ORDER — MIDAZOLAM HCL 2 MG/2ML IJ SOLN
INTRAMUSCULAR | Status: AC
  Filled 2012-06-16: qty 2

## 2012-06-16 MED ORDER — KETOROLAC TROMETHAMINE 30 MG/ML IJ SOLN: 30.0000 mg | Freq: Once | INTRAMUSCULAR | Status: AC

## 2012-06-16 MED ORDER — ONDANSETRON HCL 4 MG PO TABS: 4.0000 mg | ORAL_TABLET | Freq: Four times a day (QID) | ORAL | Status: DC | PRN

## 2012-06-16 MED ORDER — ASPIRIN 81 MG PO CHEW
324.0000 mg | CHEWABLE_TABLET | Freq: Once | ORAL | Status: AC
  Administered 2012-06-16: 324 mg via ORAL

## 2012-06-16 MED ORDER — NICOTINE 14 MG/24HR TD PT24
14.0000 mg | MEDICATED_PATCH | Freq: Every day | TRANSDERMAL | Status: DC
  Filled 2012-06-16 (×2): qty 1

## 2012-06-16 MED ORDER — MORPHINE SULFATE 2 MG/ML IJ SOLN
2.0000 mg | Freq: Once | INTRAMUSCULAR | Status: AC
  Administered 2012-06-16: 2 mg via INTRAVENOUS

## 2012-06-16 MED ORDER — HEPARIN SODIUM (PORCINE) 1000 UNIT/ML IJ SOLN
INTRAMUSCULAR | Status: AC
  Filled 2012-06-16: qty 1

## 2012-06-16 MED ORDER — ACETAMINOPHEN 325 MG PO TABS: 650.0000 mg | ORAL_TABLET | Freq: Four times a day (QID) | ORAL | Status: DC | PRN

## 2012-06-16 MED ORDER — ASPIRIN 81 MG PO CHEW
CHEWABLE_TABLET | ORAL | Status: AC
  Administered 2012-06-16: 324 mg via ORAL
  Filled 2012-06-16: qty 4

## 2012-06-16 MED ORDER — FENTANYL 50 MCG/HR TD PT72
50.0000 ug | MEDICATED_PATCH | TRANSDERMAL | Status: DC
  Administered 2012-06-17: 10:00:00 50 ug via TRANSDERMAL
  Filled 2012-06-16: qty 1

## 2012-06-16 MED ORDER — LIDOCAINE HCL (PF) 1 % IJ SOLN
INTRAMUSCULAR | Status: AC
  Filled 2012-06-16: qty 30

## 2012-06-16 MED ORDER — MORPHINE SULFATE 2 MG/ML IJ SOLN
INTRAMUSCULAR | Status: AC
  Filled 2012-06-16: qty 1

## 2012-06-16 MED ORDER — ASPIRIN 325 MG PO TABS
650.0000 mg | ORAL_TABLET | Freq: Once | ORAL | Status: AC
  Administered 2012-06-16: 650 mg via ORAL
  Filled 2012-06-16: qty 2

## 2012-06-16 MED ORDER — SODIUM CHLORIDE 0.9 % IV SOLN
250.0000 mL | INTRAVENOUS | Status: DC | PRN
  Administered 2012-06-16: 250 mL via INTRAVENOUS

## 2012-06-16 MED ORDER — NITROGLYCERIN IN D5W 200-5 MCG/ML-% IV SOLN
5.0000 ug/min | INTRAVENOUS | Status: DC
  Administered 2012-06-16: 5 ug/min via INTRAVENOUS
  Filled 2012-06-16: qty 250

## 2012-06-16 MED ORDER — KETOROLAC TROMETHAMINE 30 MG/ML IJ SOLN
INTRAMUSCULAR | Status: AC
  Administered 2012-06-16: 30 mg via INTRAVENOUS
  Filled 2012-06-16: qty 1

## 2012-06-16 MED ORDER — NITROGLYCERIN 0.4 MG SL SUBL
0.4000 mg | SUBLINGUAL_TABLET | SUBLINGUAL | Status: DC | PRN
  Administered 2012-06-16 (×4): 0.4 mg via SUBLINGUAL
  Filled 2012-06-16 (×2): qty 25

## 2012-06-16 MED ORDER — HEPARIN BOLUS VIA INFUSION
3500.0000 [IU] | Freq: Once | INTRAVENOUS | Status: AC
  Administered 2012-06-16: 3500 [IU] via INTRAVENOUS
  Filled 2012-06-16: qty 3500

## 2012-06-16 MED ORDER — SODIUM CHLORIDE 0.9 % IJ SOLN: 3.0000 mL | INTRAMUSCULAR | Status: DC | PRN

## 2012-06-16 MED ORDER — ACETAMINOPHEN 650 MG RE SUPP: 650.0000 mg | Freq: Four times a day (QID) | RECTAL | Status: DC | PRN

## 2012-06-16 NOTE — ED Provider Notes (Signed)
History     CSN: [id number]  Arrival date & time 06/16/12  0248   First MD Initiated Contact with Patient 06/16/12 0258      Chief Complaint  Patient presents with  . Chest Pain    (Consider location/radiation/quality/duration/timing/severity/associated sxs/prior treatment) HPI Comments: Patient started about 30 minutes pta with complaints of pressure in the center of her chest that radiates to her jaw.  She feels as thought she is having a hard time taking a deep breath.  She denies cough or fever.  No injury or trauma.  She denies any history of cardiac disease.  No prior heart cath or stress test.  She has the risk factors of smoking cigarettes.  Denies recent exertional symptoms.  She reports taking two full strength ASA prior to coming here.  Patient is a 57 y.o. female presenting with chest pain. The history is provided by the patient.  Chest Pain Pain location:  Substernal area Pain quality: pressure   Pain radiates to:  L jaw Pain radiates to the back: no   Pain severity:  Severe Onset quality:  Sudden Duration:  30 minutes Timing:  Constant Progression:  Worsening Chronicity:  New Context: not breathing and no movement   Relieved by:  Nothing Worsened by:  Nothing tried Ineffective treatments:  None tried Associated symptoms: nausea and shortness of breath   Associated symptoms: no abdominal pain and no dizziness     Past Medical History  Diagnosis Date  . Back pain     History reviewed. No pertinent past surgical history.  History reviewed. No pertinent family history.  History  Substance Use Topics  . Smoking status: Current Every Day Smoker -- 0.50 packs/day    Types: Cigarettes  . Smokeless tobacco: Not on file  . Alcohol Use: No    OB History   Grav Para Term Preterm Abortions TAB SAB Ect Mult Living                  Review of Systems  Respiratory: Positive for chest tightness and shortness of breath.   Cardiovascular: Positive for chest  pain.  Gastrointestinal: Positive for nausea. Negative for abdominal pain.  Neurological: Negative for dizziness.  All other systems reviewed and are negative.    Allergies  Garamycin and Penicillins  Home Medications   Current Outpatient Rx  Name  Route  Sig  Dispense  Refill  . aspirin 325 MG tablet   Oral   Take 650 mg by mouth once.         . fentaNYL (DURAGESIC - DOSED MCG/HR) 50 MCG/HR   Transdermal   Place 1 patch onto the skin every 3 (three) days.         Marland Kitchen HYDROcodone-acetaminophen (NORCO) 10-325 MG per tablet   Oral   Take 1 tablet by mouth every 6 (six) hours as needed for pain.         Marland Kitchen rOPINIRole (REQUIP) 2 MG tablet   Oral   Take 2 mg by mouth at bedtime.           BP 205/99  Pulse 65  Resp 16  Wt 157 lb (71.215 kg)  SpO2 100%  Physical Exam  Nursing note and vitals reviewed. Constitutional: She is oriented to person, place, and time. She appears well-developed and well-nourished. No distress.  Appears uncomfortable.  HENT:  Head: Normocephalic and atraumatic.  Neck: Normal range of motion. Neck supple.  Cardiovascular: Normal rate and regular rhythm.  Exam reveals no  gallop and no friction rub.   No murmur heard. Pulmonary/Chest: Effort normal and breath sounds normal. No respiratory distress. She has no wheezes.  Abdominal: Soft. Bowel sounds are normal. She exhibits no distension. There is no tenderness.  Musculoskeletal: Normal range of motion.  Neurological: She is alert and oriented to person, place, and time.  Skin: Skin is warm and dry. She is not diaphoretic.    ED Course  Procedures (including critical care time)  Labs Reviewed - No data to display No results found.   No diagnosis found.   Date: 06/16/2012  Rate: 71  Rhythm: normal sinus rhythm  QRS Axis: normal  Intervals: normal  ST/T Wave abnormalities: normal  Conduction Disutrbances:none  Narrative Interpretation:   Old EKG Reviewed: unchanged   Date:  06/16/2012  Rate: 63  Rhythm: normal sinus rhythm  QRS Axis: normal  Intervals: normal  ST/T Wave abnormalities: normal  Conduction Disutrbances:none  Narrative Interpretation: No change from prior ekg this visit.  Old EKG Reviewed: unchanged    MDM  The patient presents here with pain in the center of her chest that started about 30 minutes pta.  The workup thus far has been unremarkable and she is feeling better with morphine.  As I do not have a good explanation for her symptoms, I feel further workup is indicated.  I have spoken with [facility] at [facility] who will accept in transfer.        Geoffery Lyons, MD 06/16/12 [phone]

## 2012-06-16 NOTE — Consult Note (Addendum)
CARDIOLOGY CONSULT NOTE  Patient ID: Heidi Green, MRN: [id number], DOB/AGE: [birth year] 57 y.o. Admit date: 06/16/2012 Date of Consult: 06/16/2012  Primary Physician: None Primary Cardiologist: None  Chief Complaint: chest pain Reason for Consultation: chest pain  HPI: 57 y.o. female w/ PMHx significant for Tobacco abuse, HLD, uncontrolled HTN, depression, and chronic back pain who presented to [facility] on 06/16/2012 with complaints of chest pain.  No prior cardiac history. No previous echo, stress test, or cardiac cath. She has not had regular medical care for about the last 9 years due to lack of insurance and has therefore not been treated for her HTN or HLD. She is disabled from spinal stenosis and is not active. She rarely leaves the house. She is able to climb stairs in her house without chest pain or sob. Last night she ate dinner around 4:30 and at 9:00 vomited once. She said this is not abnormal for her as she vomits occasionally after eating. Later that night she slipped while getting into bed and fell against the bed. She denies injury to her chest. She went to bed was unable to sleep, which again is not abnormal for her because she has insomnia. Around 3am today, while awake, experienced sudden onset substernal chest pain described as an "ice cream headache-like pain". It radiated to her jaw for a split second. The jaw pain resolved, but the chest pain remained. She took 2 ASA without relief. No associated sob, nausea, diaphoresis, or dizziness. The pain did not change with ambulation or deep inspiration, but did worsen with movement of her upper body. She presented to [facility] where she was given NTG without relief. She was later given Morphine which did relieve her pain, but it returned and she was transported to [facility]. No recent surgery, prolonged immobilization, or extended travel. Denies fever, chills, sob, orthopnea, edema, early satiety, syncope, palpitations,  abd pain, change in bowel/bladder habits, melena/hematochezia.   Upon arrival BP was markedly elevated (205/99). No tachycardia or hypoxia. Initial EKG showed NSR w/ minimal inferior ST depression (<74mm). CXR shows mild vascular congestion without significant pulmonary edema. Labs are significant for normal troponin, K+ 3.1, Crt 0.7, WBC 14.0, Hgb 17.5. She was admitted by the medicine service who placed her on heparin and NTG drips and placed her in observation for further evaluation. Repeat EKG this morning shows NSR without acute ST/T changes. Cardiology is asked to assist in evaluation of chest pain. She still has 2/10 chest pain.   Past Medical History  Diagnosis Date  . Back pain   . Chest pain 06/16/2012  . Hypertension   . Kidney stones   . Headache   . Arthritis     in back      Surgical History:  Past Surgical History  Procedure Laterality Date  . Tubal ligation    . Lithotripsy    . Abdominal hysterectomy    . Back surgery       Home Meds: Medication Sig  aspirin 325 MG tablet Take 650 mg by mouth once.  fentaNYL (DURAGESIC - DOSED MCG/HR) 50 MCG/HR Place 1 patch onto the skin every 3 (three) days.  HYDROcodone-acetaminophen (NORCO) 10-325 MG per tablet Take 1 tablet by mouth every 6 (six) hours as needed for pain.  rOPINIRole (REQUIP) 2 MG tablet Take 2 mg by mouth at bedtime.    Inpatient Medications:  . aspirin  650 mg Oral Once  . fentaNYL  50 mcg Transdermal Q72H  .  heparin  3,500 Units Intravenous Once  . nicotine  14 mg Transdermal Daily  . pantoprazole  40 mg Oral Q0600  . rOPINIRole  2 mg Oral QHS  . sodium chloride  3 mL Intravenous Q12H  . sodium chloride  3 mL Intravenous Q12H   . heparin    . nitroGLYCERIN    s  Allergen Reactions  . Garamycin (Gentamicin Sulfate)   . Penicillins     History   Social History  . Marital Status: Single    Spouse Name: N/A    Number of Children: N/A  . Years of Education: N/A   Occupational History  . Not  on file.   Social History Main Topics  . Smoking status: Current Every Day Smoker -- 0.50 packs/day for 26 years    Types: Cigarettes  . Smokeless tobacco: Never Used  . Alcohol Use: No  . Drug Use: No  . Sexually Active: Not on file   Other Topics Concern  . Not on file   Social History Narrative  . No narrative on file     Family History: No known CAD  Review of Systems: General: negative for chills, fever, night sweats or weight changes.  Cardiovascular: As per HPI Dermatological: negative for rash Respiratory: negative for cough or wheezing Urologic: negative for hematuria Abdominal: (+) vomiting; negative for nausea, diarrhea, bright red blood per rectum, melena, or hematemesis Neurologic: negative for visual changes, syncope, or dizziness All other systems reviewed and are otherwise negative except as noted above.  Labs:   06/16/12 0315  TROPONINI <0.30   Component Value Date   WBC 14.0* 06/16/2012   HGB 17.5* 06/16/2012   HCT 50.7* 06/16/2012   MCV 88.5 06/16/2012   PLT 219 06/16/2012   Lab 06/16/12 0315 06/16/12 0329  NA 139  --   K 3.1*  --   CL 98  --   CO2 27  --   BUN 9  --   CREATININE 0.70  --   CALCIUM 9.3  --   PROT  --  7.7  BILITOT  --  0.4  ALKPHOS  --  89  ALT  --  8  AST  --  13  GLUCOSE 110*  --    Radiology/Studies:   06/16/2012-PORTABLE CHEST - 1 VIEW   Findings: The lungs are well-aerated.  Minimal left basilar density likely reflects overlying soft tissues.  Mild vascular congestion is noted, without significant pulmonary edema.  There is no evidence of , pleural effusion or pneumothorax.  The cardiomediastinal silhouette is within normal limits.  No acute osseous abnormalities are seen.  IMPRESSION: Mild vascular congestion noted; lungs remain grossly clear.    EKG: 06/16/12 @ 0301 - NSR 71bpm, minimal inferior ST depression (<71mm)  06/16/12 @ 0754 - NSR 57bpm, no acute ST/T changes  Physical Exam: Blood pressure 160/91, pulse 62,  temperature 98.9 F (37.2 C), temperature source Oral, resp. rate 18, height 5\' 10"  (1.778 m), weight 159 lb 2.8 oz (72.2 kg), SpO2 94.00%. General: White female who appears older than stated age, in no acute distress. Head: Normocephalic, atraumatic, sclera non-icteric, no xanthomas, nares are without discharge.  Neck: Supple. Negative for carotid bruits or JVD Lungs: Clear bilaterally to auscultation without wheezes, rales, or rhonchi. Breathing is unlabored. Heart: RRR with S1 S2. No murmurs, rubs, or gallops appreciated. Abdomen: Soft, non-tender, non-distended with normoactive bowel sounds. No hepatomegaly. No rebound/guarding. No obvious abdominal masses. Msk:  Strength and tone appear normal for  age. Extremities: Right foot drop. No clubbing or cyanosis. No edema.  Distal pedal pulses are intact and equal bilaterally. Neuro: Alert and oriented X 3. Moves all extremities spontaneously. Psych:  Responds to questions appropriately with a normal affect.   Assessment and Plan:  57 y.o. female w/ PMHx significant for Tobacco abuse, HLD, uncontrolled HTN, depression, and chronic back pain who presented to [facility] on 06/16/2012 with complaints of chest pain.  1. Precordial pain concerning for unstable angina 2. Hypokalemia 3. Hypertension, Uncontrolled 4. H/o Hyperlipidemia, lipids pending 5. Tobacco Abuse 6. Spinal stenosis  Patient presents with chest pain with typical and atypical features. Initial EKG with mild ST depression inferiorly that has resolved despite constant chest pain. Initial troponin normal. Given her multiple cardiac risk factors including uncontrolled HTN, HLD, and tobacco abuse will need ischemic evaluation with Lexiscan myoview. Will make NPO after midnight and plan for this tomorrow morning. If abnormal will need to consider cardiac catheterization, which she is hesitant about, but willing to undergo if needed. BP has improved since admission and is currently  being treated with IV NTG. Depending on myoview results and lipid panel may need to add statin (LFTs ok). Will also need to be placed on antihypertensives, but will wait until after chest pain evaluation. Encouraged tobacco cessation. Supplement potassium and follow BMET.   Signed, HOPE, JESSICA PA-C 06/16/2012, 8:55 AM  I have taken a history, reviewed medications, allergies, PMH, SH, FH, and reviewed ROS and examined the patient.  I agree with the assessment and plan. Patient strongly advised to stop smoking. Will check lipids. May need statin. Agrees to have lexiscan myoview and if positive to have cardiac cath. She refuses cath initially.  Thomas C. Daleen Squibb, MD, [facility] Pager:  [phone]  Addendum:  Patient has ruled in for NSTEMI, troponin returned elevated at 1.03. After discussion with Dr. Daleen Squibb and Mrs. Pandolfi will plan for cardiac catheterization with possible PCI today. She understands the risks and benefits and is willing to proceed. She has been NPO and denies history of dye allergy.  Thomas C. Daleen Squibb, MD, [facility] Pager:  [phone]

## 2012-06-16 NOTE — Progress Notes (Signed)
Patient is being transported to cath lab.  Lorretta Harp RN

## 2012-06-16 NOTE — ED Notes (Signed)
Pt reports that she awoke from sleep with pressure in center of chest that radiates to jaw

## 2012-06-16 NOTE — CV Procedure (Signed)
   Cardiac Catheterization Procedure Note  Name: Heidi Green MRN: [id number] DOB: 14-Feb-1956  Procedure: Left Heart Cath, Selective Coronary Angiography, LV angiography  Indication: 57 yo WF with multiple cardiac risk factors presents with chest pain and mildly abnormal troponin.   Procedural Details: The right wrist was prepped, draped, and anesthetized with 1% lidocaine. Using the modified Seldinger technique, a 5 French sheath was introduced into the right radial artery. 3 mg of verapamil was administered through the sheath, weight-based unfractionated heparin was administered intravenously. Standard Judkins catheters were used for selective coronary angiography and left ventriculography. Catheter exchanges were performed over an exchange length guidewire. There were no immediate procedural complications. A TR band was used for radial hemostasis at the completion of the procedure.  The patient was transferred to the post catheterization recovery area for further monitoring.  Procedural Findings: Hemodynamics: AO 122/67 mean 89 mm Hg LV 120/7 mm Hg  Coronary angiography: Coronary dominance: right  Left mainstem: Normal  Left anterior descending (LAD):  Very large vessel. Focal 20% mid LAD. The first diagonal is a large bifurcating vessel. The smaller superior branch has an abrupt caliber change with tapering distally but no significant stenosis seen.  Left circumflex (LCx): Normal.  Right coronary artery (RCA): Large dominant vessel. 10% disease distally.  Left ventriculography: Left ventricular systolic function is normal, LVEF is estimated at 55-65%, there is no significant mitral regurgitation   Final Conclusions:   1. No significant obstructive coronary artery disease. 2. Normal LV function.  Recommendations: Medical management.  Theron Arista [facility] 06/16/2012, 3:10 PM

## 2012-06-16 NOTE — Interval H&P Note (Signed)
History and Physical Interval Note:  06/16/2012 2:09 PM  Heidi Green  has presented today for surgery, with the diagnosis of Chest pain  The various methods of treatment have been discussed with the patient and family. After consideration of risks, benefits and other options for treatment, the patient has consented to  Procedure(s): LEFT HEART CATHETERIZATION WITH CORONARY ANGIOGRAM (N/A) as a surgical intervention .  The patient's history has been reviewed, patient examined, no change in status, stable for surgery.  I have reviewed the patient's chart and labs.  Questions were answered to the patient's satisfaction.     Theron Arista [facility] 06/16/2012 2:09 PM

## 2012-06-16 NOTE — H&P (Signed)
PCP:   No primary provider on file.   Chief Complaint:  Chest pain.  HPI: This is a 57 year old female, with known history of tobacco abuse, seasonal allergies, HTN, dyslipidemia, depression, chronic low back pain, lumbar spinal stenosis, s/p lumbar diskectomy at L5-S1, subsequent re-do decompressive surgery 02/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 According to patient, she had gone to bed at her usual time, on 06/15/12. At 3:00 AM on 06/16/12, she was woken up with severe retrosternal chest pressure, with radiation to right angle of jaw. No nausea, diaphoresis or SOB. She took 2 ASA, pain persisted, and her spouse drove her to [facility], where she was administered SL NTG and iv Morphine, with relief. She was then transferred to [facility]. At about 5:00 AM today, pain has recurred and persisted. Patient has no history of previous similar episodes.   Allergies:   Allergies  Allergen Reactions  . Garamycin (Gentamicin Sulfate)   . Penicillins       Past Medical History  Diagnosis Date  . Back pain     History reviewed. No pertinent past surgical history.  Prior to Admission medications   Medication Sig Start Date End Date Taking? Authorizing Provider  aspirin 325 MG tablet Take 650 mg by mouth once.   Yes Historical Provider, MD  fentaNYL (DURAGESIC - DOSED MCG/HR) 50 MCG/HR Place 1 patch onto the skin every 3 (three) days.   Yes Historical Provider, MD  HYDROcodone-acetaminophen (NORCO) 10-325 MG per tablet Take 1 tablet by mouth every 6 (six) hours as needed for pain.   Yes Historical Provider, MD  rOPINIRole (REQUIP) 2 MG tablet Take 2 mg by mouth at bedtime.   Yes Historical Provider, MD    Social History: Patient reports that she has been smoking Cigarettes.  She has been smoking about 0.50 packs per day. She does not have any smokeless tobacco history on file. She reports  that she does not drink alcohol. Her drug history is not on file.  Family History:  Father's health history is unknown. Grand father died in his 29s of coronary artery disease. Mother died at age 55 years of emphysema. She had diabetes and hypertension.   Review of Systems:  As per HPI and chief complaint. Patent denies fatigue, diminished appetite, weight loss, fever, chills, headache, blurred vision, difficulty in speaking, dysphagia, cough, shortness of breath, orthopnea, paroxysmal nocturnal dyspnea, nausea, diaphoresis, abdominal pain, diarrhea, belching, heartburn. She had a single episode of vomiting in afternoon of 06/15/12, but this has not recurred. No hematemesis, melena, dysuria, nocturia, urinary frequency, hematochezia, lower extremity swelling, pain, or redness. The rest of the systems review is negative.  Physical Exam:  General:  Patient has some on-going chest discomfort at the time of this evaluation, only slightly alleviated by SL NTG, administered by RN. Alert, communicative, fully oriented, talking in complete sentences, not short of breath at rest.  HEENT:  No clinical pallor, no jaundice, no conjunctival injection or discharge. Hydration is fair.  NECK:  Supple, JVP not seen, no carotid bruits, no palpable lymphadenopathy, no palpable goiter. CHEST:  Clinically clear to auscultation, no wheezes, no crackles. HEART:  Sounds 1 and 2 heard, normal, regular, no murmurs. ABDOMEN:  Full, soft, non-tender, no palpable organomegaly, no palpable masses, normal bowel sounds. GENITALIA:  Not examined. LOWER EXTREMITIES:  No pitting edema, palpable peripheral pulses. MUSCULOSKELETAL SYSTEM:  Unremarkable. CENTRAL NERVOUS SYSTEM:  No focal neurologic deficit on gross examination.  Labs on Admission:  Results for orders placed during the hospital encounter of 06/16/12 (from the past 48 hour(s))  CBC     Status: Abnormal   Collection Time    06/16/12  3:15 AM      Result Value Range    WBC 14.0 (*) 4.0 - 10.5 K/uL   RBC 5.73 (*) 3.87 - 5.11 MIL/uL   Hemoglobin 17.5 (*) 12.0 - 15.0 g/dL   HCT 78.2 (*) 95.6 - 21.3 %   MCV 88.5  78.0 - 100.0 fL   MCH 30.5  26.0 - 34.0 pg   MCHC 34.5  30.0 - 36.0 g/dL   RDW 08.6  57.8 - 46.9 %   Platelets 219  150 - 400 K/uL  BASIC METABOLIC PANEL     Status: Abnormal   Collection Time    06/16/12  3:15 AM      Result Value Range   Sodium 139  135 - 145 mEq/L   Potassium 3.1 (*) 3.5 - 5.1 mEq/L   Chloride 98  96 - 112 mEq/L   CO2 27  19 - 32 mEq/L   Glucose, Bld 110 (*) 70 - 99 mg/dL   BUN 9  6 - 23 mg/dL   Creatinine, Ser 6.29  0.50 - 1.10 mg/dL   Calcium 9.3  8.4 - 52.8 mg/dL   GFR calc non Af Amer >90  >90 mL/min   GFR calc Af Amer >90  >90 mL/min   Comment:            The eGFR has been calculated     using the CKD EPI equation.     This calculation has not been     validated in all clinical     situations.     eGFR's persistently     <90 mL/min signify     possible Chronic Kidney Disease.  TROPONIN I     Status: None   Collection Time    06/16/12  3:15 AM      Result Value Range   Troponin I <0.30  <0.30 ng/mL   Comment:            Due to the release kinetics of cTnI,     a negative result within the first hours     of the onset of symptoms does not rule out     myocardial infarction with certainty.     If myocardial infarction is still suspected,     repeat the test at appropriate intervals.  LIPASE, BLOOD     Status: None   Collection Time    06/16/12  3:29 AM      Result Value Range   Lipase 25  11 - 59 U/L  HEPATIC FUNCTION PANEL     Status: None   Collection Time    06/16/12  3:29 AM      Result Value Range   Total Protein 7.7  6.0 - 8.3 g/dL   Albumin 4.3  3.5 - 5.2 g/dL   AST 13  0 - 37 U/L   ALT 8  0 - 35 U/L   Alkaline Phosphatase 89  39 - 117 U/L   Total Bilirubin 0.4  0.3 - 1.2 mg/dL   Bilirubin, Direct <4.1  0.0 - 0.3 mg/dL   Indirect Bilirubin NOT CALCULATED  0.3 - 0.9 mg/dL     Radiological Exams on Admission: Dg Chest Port 1 View  06/16/2012  *  RADIOLOGY REPORT*  Clinical Data: Mid chest pain, radiating to the jaw.  PORTABLE CHEST - 1 VIEW  Comparison: None.  Findings: The lungs are well-aerated.  Minimal left basilar density likely reflects overlying soft tissues.  Mild vascular congestion is noted, without significant pulmonary edema.  There is no evidence of , pleural effusion or pneumothorax.  The cardiomediastinal silhouette is within normal limits.  No acute osseous abnormalities are seen.  IMPRESSION: Mild vascular congestion noted; lungs remain grossly clear.   Original Report Authenticated By: Tonia Ghent, M.D.     Assessment/Plan Active Problems:    1. Chest pain: Patient presented with recurrent retrosternal chest discomfort, against a background of uncontrolled HTN, dyslipidemia and tobacco abuse. !2-Lead EKG showed PVCs, SR, partial RBBB, but no acute ischemic changes, and initial set of cardiac enzymes is normal. Presentation is concerning for unstable angina, although GI pathology is in the differential. Have commenced ivi NTG and ivi Heparin. Will complete cycling CES. And will monitor telemetrically. Have consulted [place] cardiology. Empiric Protonix.  2. HTN (hypertension): This is severe, uncontrolled. Per patient, she has been off antihypertensives, since 2004, when following hysterectomy, her GYN took her of antihypertensives, because her BP was low. She has not seen a PMD, since then. BP was 205/99 at presentation, and is now 180/103-160/91. This should respond to ivi NTG, but clearly, patient will need scheduled antihypertensives, going forward.  3. Dyslipidemia: Check lipid profile/TSH.  4. Low back pain: This is chronic. Patient is s/p 2 back surgeries. Not problematic at this time. Will continue pre-admission pain medication.  5. Tobacco abuse: Patient unfortunately, smokes a half-pack of cigarettes per day, and has done so for about 27  years. counseled and placed on Nicoderm CQ patch.   Further management will depend on clinical course.   Comment: Patient is FULL CODE.    Time Spent on Admission: 45 mins.   OTI,CHRISTOPHER 06/16/2012, 8:02 AM

## 2012-06-16 NOTE — Progress Notes (Signed)
CRITICAL VALUE ALERT  1Critical value received:  Troponin 1.03  Date of notification:  05/19/12  Time of notification:  1055  Critical value read back: yes  Nurse who received alert:  D. Manson Passey RN  MD notified (1st page):  Oti MD  Time of first page:  1110  MD notified (2nd page):  Time of second page:  Responding MD:  Oti MD  Time MD responded:  1125

## 2012-06-16 NOTE — H&P (View-Only) (Signed)
 CARDIOLOGY CONSULT NOTE  Patient ID: Heidi Green, MRN: [id number], DOB/AGE: 06/07/1955 56 y.o. Admit date: 06/16/2012 Date of Consult: 06/16/2012  Primary Physician: None Primary Cardiologist: None  Chief Complaint: chest pain Reason for Consultation: chest pain  HPI: 56 y.o. female w/ PMHx significant for Tobacco abuse, HLD, uncontrolled HTN, depression, and chronic back pain who presented to [facility] on 06/16/2012 with complaints of chest pain.  No prior cardiac history. No previous echo, stress test, or cardiac cath. She has not had regular medical care for about the last 9 years due to lack of insurance and has therefore not been treated for her HTN or HLD. She is disabled from spinal stenosis and is not active. She rarely leaves the house. She is able to climb stairs in her house without chest pain or sob. Last night she ate dinner around 4:30 and at 9:00 vomited once. She said this is not abnormal for her as she vomits occasionally after eating. Later that night she slipped while getting into bed and fell against the bed. She denies injury to her chest. She went to bed was unable to sleep, which again is not abnormal for her because she has insomnia. Around 3am today, while awake, experienced sudden onset substernal chest pain described as an "ice cream headache-like pain". It radiated to her jaw for a split second. The jaw pain resolved, but the chest pain remained. She took 2 ASA without relief. No associated sob, nausea, diaphoresis, or dizziness. The pain did not change with ambulation or deep inspiration, but did worsen with movement of her upper body. She presented to [facility] where she was given NTG without relief. She was later given Morphine which did relieve her pain, but it returned and she was transported to [place]. No recent surgery, prolonged immobilization, or extended travel. Denies fever, chills, sob, orthopnea, edema, early satiety, syncope, palpitations,  abd pain, change in bowel/bladder habits, melena/hematochezia.   Upon arrival BP was markedly elevated (205/99). No tachycardia or hypoxia. Initial EKG showed NSR w/ minimal inferior ST depression (<1mm). CXR shows mild vascular congestion without significant pulmonary edema. Labs are significant for normal troponin, K+ 3.1, Crt 0.7, WBC 14.0, Hgb 17.5. She was admitted by the medicine service who placed her on heparin and NTG drips and placed her in observation for further evaluation. Repeat EKG this morning shows NSR without acute ST/T changes. Cardiology is asked to assist in evaluation of chest pain. She still has 2/10 chest pain.   Past Medical History  Diagnosis Date  . Back pain   . Chest pain 06/16/2012  . Hypertension   . Kidney stones   . Headache   . Arthritis     in back      Surgical History:  Past Surgical History  Procedure Laterality Date  . Tubal ligation    . Lithotripsy    . Abdominal hysterectomy    . Back surgery       Home Meds: Medication Sig  aspirin 325 MG tablet Take 650 mg by mouth once.  fentaNYL (DURAGESIC - DOSED MCG/HR) 50 MCG/HR Place 1 patch onto the skin every 3 (three) days.  HYDROcodone-acetaminophen (NORCO) 10-325 MG per tablet Take 1 tablet by mouth every 6 (six) hours as needed for pain.  rOPINIRole (REQUIP) 2 MG tablet Take 2 mg by mouth at bedtime.    Inpatient Medications:  . aspirin  650 mg Oral Once  . fentaNYL  50 mcg Transdermal Q72H  .   heparin  3,500 Units Intravenous Once  . nicotine  14 mg Transdermal Daily  . pantoprazole  40 mg Oral Q0600  . rOPINIRole  2 mg Oral QHS  . sodium chloride  3 mL Intravenous Q12H  . sodium chloride  3 mL Intravenous Q12H   . heparin    . nitroGLYCERIN    s  Allergen Reactions  . Garamycin (Gentamicin Sulfate)   . Penicillins     History   Social History  . Marital Status: Single    Spouse Name: N/A    Number of Children: N/A  . Years of Education: N/A   Occupational History  . Not  on file.   Social History Main Topics  . Smoking status: Current Every Day Smoker -- 0.50 packs/day for 26 years    Types: Cigarettes  . Smokeless tobacco: Never Used  . Alcohol Use: No  . Drug Use: No  . Sexually Active: Not on file   Other Topics Concern  . Not on file   Social History Narrative  . No narrative on file     Family History: No known CAD  Review of Systems: General: negative for chills, fever, night sweats or weight changes.  Cardiovascular: As per HPI Dermatological: negative for rash Respiratory: negative for cough or wheezing Urologic: negative for hematuria Abdominal: (+) vomiting; negative for nausea, diarrhea, bright red blood per rectum, melena, or hematemesis Neurologic: negative for visual changes, syncope, or dizziness All other systems reviewed and are otherwise negative except as noted above.  Labs:   06/16/12 0315  TROPONINI <0.30   Component Value Date   WBC 14.0* 06/16/2012   HGB 17.5* 06/16/2012   HCT 50.7* 06/16/2012   MCV 88.5 06/16/2012   PLT 219 06/16/2012   Lab 06/16/12 0315 06/16/12 0329  NA 139  --   K 3.1*  --   CL 98  --   CO2 27  --   BUN 9  --   CREATININE 0.70  --   CALCIUM 9.3  --   PROT  --  7.7  BILITOT  --  0.4  ALKPHOS  --  89  ALT  --  8  AST  --  13  GLUCOSE 110*  --    Radiology/Studies:   06/16/2012-PORTABLE CHEST - 1 VIEW   Findings: The lungs are well-aerated.  Minimal left basilar density likely reflects overlying soft tissues.  Mild vascular congestion is noted, without significant pulmonary edema.  There is no evidence of , pleural effusion or pneumothorax.  The cardiomediastinal silhouette is within normal limits.  No acute osseous abnormalities are seen.  IMPRESSION: Mild vascular congestion noted; lungs remain grossly clear.    EKG: 06/16/12 @ 0301 - NSR 71bpm, minimal inferior ST depression (<1mm)  06/16/12 @ 0754 - NSR 57bpm, no acute ST/T changes  Physical Exam: Blood pressure 160/91, pulse 62,  temperature 98.9 F (37.2 C), temperature source Oral, resp. rate 18, height 5' 10" (1.778 m), weight 159 lb 2.8 oz (72.2 kg), SpO2 94.00%. General: White female who appears older than stated age, in no acute distress. Head: Normocephalic, atraumatic, sclera non-icteric, no xanthomas, nares are without discharge.  Neck: Supple. Negative for carotid bruits or JVD Lungs: Clear bilaterally to auscultation without wheezes, rales, or rhonchi. Breathing is unlabored. Heart: RRR with S1 S2. No murmurs, rubs, or gallops appreciated. Abdomen: Soft, non-tender, non-distended with normoactive bowel sounds. No hepatomegaly. No rebound/guarding. No obvious abdominal masses. Msk:  Strength and tone appear normal for   age. Extremities: Right foot drop. No clubbing or cyanosis. No edema.  Distal pedal pulses are intact and equal bilaterally. Neuro: Alert and oriented X 3. Moves all extremities spontaneously. Psych:  Responds to questions appropriately with a normal affect.   Assessment and Plan:  56 y.o. female w/ PMHx significant for Tobacco abuse, HLD, uncontrolled HTN, depression, and chronic back pain who presented to [facility] on 06/16/2012 with complaints of chest pain.  1. Precordial pain concerning for unstable angina 2. Hypokalemia 3. Hypertension, Uncontrolled 4. H/o Hyperlipidemia, lipids pending 5. Tobacco Abuse 6. Spinal stenosis  Patient presents with chest pain with typical and atypical features. Initial EKG with mild ST depression inferiorly that has resolved despite constant chest pain. Initial troponin normal. Given her multiple cardiac risk factors including uncontrolled HTN, HLD, and tobacco abuse will need ischemic evaluation with Lexiscan myoview. Will make NPO after midnight and plan for this tomorrow morning. If abnormal will need to consider cardiac catheterization, which she is hesitant about, but willing to undergo if needed. BP has improved since admission and is currently  being treated with IV NTG. Depending on myoview results and lipid panel may need to add statin (LFTs ok). Will also need to be placed on antihypertensives, but will wait until after chest pain evaluation. Encouraged tobacco cessation. Supplement potassium and follow BMET.   Signed, HOPE, JESSICA PA-C 06/16/2012, 8:55 AM  I have taken a history, reviewed medications, allergies, PMH, SH, FH, and reviewed ROS and examined the patient.  I agree with the assessment and plan. Patient strongly advised to stop smoking. Will check lipids. May need statin. Agrees to have lexiscan myoview and if positive to have cardiac cath. She refuses cath initially.  Thomas C. Wall, MD, FACC [facility] Pager:  [phone]  Addendum:  Patient has ruled in for NSTEMI, troponin returned elevated at 1.03. After discussion with Dr. Wall and Heidi Green will plan for cardiac catheterization with possible PCI today. She understands the risks and benefits and is willing to proceed. She has been NPO and denies history of dye allergy.  Thomas C. Wall, MD, FACC [facility] Pager:  [phone] 

## 2012-06-16 NOTE — Progress Notes (Signed)
Utilization Review Completed.   Kimberly Tucker, RN, BSN Nurse Case Manager  [phone]  

## 2012-06-16 NOTE — Progress Notes (Signed)
Critical Lab value called in from the lab; attending MD notified and orders given to also notify cardiology; PA notified will continue to monitor patient. Lorretta Harp RN

## 2012-06-16 NOTE — Progress Notes (Signed)
Patient is complaining of chest pain and threatening to leave AMA if her pain does not go away.  One nitroglycerin given sublingual; will continue to monitor patient.  Lorretta Harp RN

## 2012-06-16 NOTE — ED Notes (Signed)
Called [facility] 

## 2012-06-16 NOTE — Progress Notes (Signed)
ANTICOAGULATION CONSULT NOTE - Initial Consult  Pharmacy Consult:  Heparin Indication:  [place]  Allergies  Allergen Reactions  . Garamycin (Gentamicin Sulfate)   . Penicillins     Patient Measurements: Height: 5\' 10"  (177.8 cm) Weight: 159 lb 2.8 oz (72.2 kg) IBW/kg (Calculated) : 68.5 Heparin Dosing Weight: 72 kg  Vital Signs: Temp: 98.9 F (37.2 C) (03/04 0637) Temp src: Oral (03/04 0637) BP: 160/91 mmHg (03/04 0757) Pulse Rate: 62 (03/04 0757)  Labs:  Recent Labs  06/16/12 0315  HGB 17.5*  HCT 50.7*  PLT 219  CREATININE 0.70  TROPONINI <0.30    Estimated Creatinine Clearance: 83.9 ml/min (by C-G formula based on Cr of 0.7).   Medical History: Past Medical History  Diagnosis Date  . Back pain         Assessment: 63 YOF admitted with CP to start IV heparin for [place].  Baseline labs appropriate to start med.       Goal of Therapy:  Heparin level 0.3-0.7 units/ml Monitor platelets by anticoagulation protocol: Yes    Plan:  - Heparin 3500 units IV x 1, then - Heparin gtt at 900 units/hr - Check 6 hr HL - Daily HL / CBC - F/U KCL supplementation and standing ASA order     Thuy D. Laney Potash, PharmD, BCPS Pager:  [phone] 06/16/2012, 8:23 AM

## 2012-06-17 DIAGNOSIS — I214 Non-ST elevation (NSTEMI) myocardial infarction: Secondary | ICD-10-CM

## 2012-06-17 DIAGNOSIS — I517 Cardiomegaly: Secondary | ICD-10-CM

## 2012-06-17 LAB — CBC
HCT: 43.6 % (ref 36.0–46.0)
Hemoglobin: 15.2 g/dL — ABNORMAL HIGH (ref 12.0–15.0)
MCH: 30.3 pg (ref 26.0–34.0)
MCHC: 34.9 g/dL (ref 30.0–36.0)
RDW: 13.7 % (ref 11.5–15.5)

## 2012-06-17 LAB — BASIC METABOLIC PANEL
BUN: 8 mg/dL (ref 6–23)
Chloride: 102 mEq/L (ref 96–112)
Creatinine, Ser: 0.54 mg/dL (ref 0.50–1.10)
GFR calc Af Amer: >90 mL/min (ref >90)
Glucose, Bld: 90 mg/dL (ref 70–99)
Potassium: 4.6 mEq/L (ref 3.5–5.1)

## 2012-06-17 LAB — CK TOTAL AND CKMB (NOT AT ARMC): Relative Index: 8.3 — ABNORMAL HIGH (ref 0.0–2.5)

## 2012-06-17 MED ORDER — ROPINIROLE HCL 1 MG PO TABS: 2.0000 mg | ORAL_TABLET | Freq: Two times a day (BID) | ORAL | Status: DC

## 2012-06-17 MED ORDER — AMLODIPINE BESYLATE 5 MG PO TABS: 5.0000 mg | ORAL_TABLET | Freq: Every day | ORAL | Status: DC

## 2012-06-17 MED ORDER — ROPINIROLE HCL 1 MG PO TABS: 2.0000 mg | ORAL_TABLET | Freq: Once | ORAL | Status: DC

## 2012-06-17 MED ORDER — ASPIRIN 81 MG PO TABS: 81.0000 mg | ORAL_TABLET | Freq: Every day | ORAL | Status: DC

## 2012-06-17 MED ORDER — AMLODIPINE BESYLATE 5 MG PO TABS
5.0000 mg | ORAL_TABLET | Freq: Every day | ORAL | Status: DC
  Administered 2012-06-17: 5 mg via ORAL
  Filled 2012-06-17: qty 1

## 2012-06-17 MED ORDER — ROPINIROLE HCL 1 MG PO TABS
2.0000 mg | ORAL_TABLET | Freq: Two times a day (BID) | ORAL | Status: DC
  Administered 2012-06-17: 07:00:00 2 mg via ORAL
  Filled 2012-06-17 (×2): qty 2

## 2012-06-17 NOTE — Progress Notes (Signed)
  Echocardiogram 2D Echocardiogram has been performed.  [place] Co 06/17/2012, 12:07 PM

## 2012-06-17 NOTE — Significant Event (Signed)
CRITICAL VALUE ALERT  Critical value received:  CK-MB- 16.7  Date of notification:  06/17/12  Time of notification:  1045  Critical value read back:yes  Nurse who received alert:  Hughes Better  MD notified (1st page):  Annabelle Harman, [place]  Time of first page:  1050  MD notified (2nd page): Dr Swaziland  Time of second page:1055  Responding MD:  Dr Swaziland  Time MD responded:  1058

## 2012-06-17 NOTE — Progress Notes (Signed)
Per Theodore Demark, PA, patient cleared for discharge.  Dr. Arthor Captain notified of clearance from cardiology.  Discharge instructions given to patient and family, all questions answered.  Patient escorted via wheelchair by Koren Bound, Chiropodist, to [employer].

## 2012-06-17 NOTE — Progress Notes (Signed)
TELEMETRY: Reviewed telemetry pt in sinus bradycardia: Filed Vitals:   06/16/12 2039 06/17/12 0037 06/17/12 0542 06/17/12 0714  BP: 119/69 164/90 168/100   Pulse: 68 66 56   Temp: 97.5 F (36.4 C) 97.3 F (36.3 C) 98.9 F (37.2 C) 98.2 F (36.8 C)  TempSrc: Oral Oral Oral Oral  Resp: 20 18 20    Height:      Weight:  160 lb 11.5 oz (72.9 kg)    SpO2: 97% 98% 97%     Intake/Output Summary (Last 24 hours) at 06/17/12 0830 Last data filed at 06/16/12 1847  Gross per 24 hour  Intake 601.87 ml  Output      0 ml  Net 601.87 ml    SUBJECTIVE No more chest pain. Patient anxious to go home.   LABS: Basic Metabolic Panel:  Recent Labs  16/10/96 0315 06/17/12 0510  NA 139 138  K 3.1* 4.6  CL 98 102  CO2 27 27  GLUCOSE 110* 90  BUN 9 8  CREATININE 0.70 0.54  CALCIUM 9.3 9.1   Liver Function Tests:  Recent Labs  06/16/12 0329  AST 13  ALT 8  ALKPHOS 89  BILITOT 0.4  PROT 7.7  ALBUMIN 4.3    Recent Labs  06/16/12 0329  LIPASE 25   CBC:  Recent Labs  06/16/12 0315 06/17/12 0510  WBC 14.0* 11.0*  HGB 17.5* 15.2*  HCT 50.7* 43.6  MCV 88.5 86.9  PLT 219 174   Cardiac Enzymes:  Recent Labs  06/16/12 0917 06/16/12 1648 06/16/12 2023  TROPONINI 1.03* 3.00* 3.88*   Fasting Lipid Panel:  Recent Labs  06/16/12 0917  CHOL 159  HDL 33*  LDLCALC 76  TRIG 045*  CHOLHDL 4.8   Thyroid Function Tests:  Recent Labs  06/16/12 0917  TSH 3.164   Radiology/Studies:  Dg Chest Port 1 View  06/16/2012  *RADIOLOGY REPORT*  Clinical Data: Mid chest pain, radiating to the jaw.  PORTABLE CHEST - 1 VIEW  Comparison: None.  Findings: The lungs are well-aerated.  Minimal left basilar density likely reflects overlying soft tissues.  Mild vascular congestion is noted, without significant pulmonary edema.  There is no evidence of , pleural effusion or pneumothorax.  The cardiomediastinal silhouette is within normal limits.  No acute osseous abnormalities are  seen.  IMPRESSION: Mild vascular congestion noted; lungs remain grossly clear.   Original Report Authenticated By: Tonia Ghent, M.D.    Ecg: 17-Jun-2012 40:98:11 [facility] ROUTINE RECORD Normal sinus rhythm Left axis deviation Abnormal ECG  PHYSICAL EXAM General: Well developed, well nourished, in no acute distress. Head: Normocephalic, atraumatic, sclera non-icteric, no xanthomas, nares are without discharge. Neck: Negative for carotid bruits. JVD not elevated. Lungs: Clear bilaterally to auscultation without wheezes, rales, or rhonchi. Breathing is unlabored. Heart: RRR S1 S2 without murmurs, rubs, or gallops.  Abdomen: Soft, non-tender, non-distended with normoactive bowel sounds. No hepatomegaly. No rebound/guarding. No obvious abdominal masses. Msk:  Strength and tone appears normal for age. Extremities: No clubbing, cyanosis or edema.  Distal pedal pulses are 2+ and equal bilaterally. No radial site hematoma. Neuro: Alert and oriented X 3. Moves all extremities spontaneously. Psych:  Responds to questions appropriately with a normal affect.  ASSESSMENT AND PLAN: 1. NSTEMI. Troponin significantly elevated. Normal Ecg. No recurrent chest pain. No obstructive disease on cath. Only finding was change in caliber of sub branch of the diagonal which is of questionable significance. ? Vasospasm. Continue ASA.Will check CKMB. Check  Echo. Ambulate today. If Echo is normal I think she could be DC later today.  2. HTN- not a good candidate for beta blocker due to resting bradycardia. Will start amlodipine. 3. Tobacco abuse- recommend cessation. 4. Hyperlipidemia. Mainly elevated Triglycerides.  Active Problems:   Chest pain   HTN (hypertension)   Dyslipidemia   Low back pain   Tobacco abuse    Signed, Peter Swaziland MD,FACC 06/17/2012 8:39 AM

## 2012-06-17 NOTE — Discharge Summary (Signed)
Physician Discharge Summary  Heidi Green:[id number] DOB: [birth year] DOA: 06/16/2012  PCP: No primary Heidi Green on file.  Admit date: 06/16/2012 Discharge date: 06/17/2012  Time spent: 40 minutes minutes  Recommendations for Outpatient Follow-up:  1. For Dr. Swaziland within 2 weeks  Discharge Diagnoses:  Principal Problem:   NSTEMI (non-ST elevated myocardial infarction) Active Problems:   Chest pain   HTN (hypertension)   Dyslipidemia   Low back pain   Tobacco abuse   Discharge Condition: Stable  Diet recommendation: Heart healthy diet  Filed Weights   06/16/12 0255 06/16/12 0637 06/17/12 0037  Weight: 71.215 kg (157 lb) 72.2 kg (159 lb 2.8 oz) 72.9 kg (160 lb 11.5 oz)    History of present illness:  This is a 57 year old female, with known history of tobacco abuse, seasonal allergies, HTN, dyslipidemia, depression, chronic low back pain, lumbar spinal stenosis, s/p lumbar diskectomy at L5-S1, subsequent re-do decompressive surgery 02/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 According to patient, she had gone to bed at her usual time, on 06/15/12. At 3:00 AM on 06/16/12, she was woken up with severe retrosternal chest pressure, with radiation to right angle of jaw. No nausea, diaphoresis or SOB. She took 2 ASA, pain persisted, and her spouse drove her to [facility], where she was administered SL NTG and iv Morphine, with relief. She was then transferred to [facility]. At about 5:00 AM today, pain has recurred and persisted. Patient has no history of previous similar episodes.    Hospital Course:   1. Non-ST segment elevation MI: Patient presented to the hospital with retrosternal chest discomfort, she has uncontrolled hypertension, dyslipidemia tobacco abuse. She has partial RBBB, the time of admission cardiology was consulted, Dr. Swaziland kindly evaluated the patient with cardiac  catheterization which showed no obstructive lesions. The troponin peak was 3.88. After seeing by cardiology and a cardiac catheterization was done, I spoke with Dr. Swaziland who okayed her discharge from the hospital. Patient should be on low-dose aspirin amlodipine for blood pressure. Beta blockers is contraindicated because of bradycardia. No stenting is indicated as her LDL was in the 70s.  2. Hypertension: Controlled hypertension, patient upon admission to the hospital had a pressure of 200/99. Responded very well to the IV nitroglycerin. Cardiology recommended amlodipine 5 mg, she will probably need further titration of her blood pressure medications.  3. Dyslipidemia: Lipid profile as below, and that is contraindicated.  4. Tobacco abuse: Patient smokes half pack of cigarettes per day, counseled extensively about smoking.  5. Spinal stenosis:  and chronic back pain, patient has 2 back surgeries before, she said she does not walk, she has right dropped foot. She is on chronic narcotics and she follows with pain clinic in [place], [place].  Procedures:  2-D echocardiogram, showed ejection fraction of 66 5% with grade 1 diastolic dysfunction, there is no wall motion abnormalities.  Cardiac catheterization done by Dr. Peter Swaziland on 06/16/2012 showed no obstructive CAD and normal LV function.  Consultations:  [place] [facility], Dr. Swaziland  Discharge Exam: Filed Vitals:   06/16/12 2039 06/17/12 0037 06/17/12 0542 06/17/12 0714  BP: 119/69 164/90 168/100   Pulse: 68 66 56   Temp: 97.5 F (36.4 C) 97.3 F (36.3 C) 98.9 F (37.2 C) 98.2 F (36.8 C)  TempSrc: Oral Oral Oral Oral  Resp: 20 18 20    Height:  Weight:  72.9 kg (160 lb 11.5 oz)    SpO2: 97% 98% 97%    General: Alert and awake, oriented x3, not in any acute distress. HEENT: anicteric sclera, pupils reactive to light and accommodation, EOMI CVS: S1-S2 clear, no murmur rubs or gallops Chest: clear to auscultation  bilaterally, no wheezing, rales or rhonchi Abdomen: soft nontender, nondistended, normal bowel sounds, no organomegaly Extremities: no cyanosis, clubbing or edema noted bilaterally, right dropped foot. Neuro: Cranial nerves II-XII intact, no focal neurological deficits  Discharge Instructions     Medication List    TAKE these medications       amLODipine 5 MG tablet  Commonly known as:  NORVASC  Take 1 tablet (5 mg total) by mouth daily.     aspirin 81 MG tablet  Take 1 tablet (81 mg total) by mouth daily.     fentaNYL 75 MCG/HR  Commonly known as:  DURAGESIC - dosed mcg/hr  Place 1 patch onto the skin every 3 (three) days.     HYDROcodone-acetaminophen 10-325 MG per tablet  Commonly known as:  NORCO  Take 1 tablet by mouth every 6 (six) hours as needed for pain.     rOPINIRole 2 MG tablet  Commonly known as:  REQUIP  Take 2 mg by mouth 2 (two) times daily.           Follow-up Information   Follow up with Peter Swaziland, MD In 2 weeks.   Contact information:   [address] [place] [address] [phone]        The results of significant diagnostics from this hospitalization (including imaging, microbiology, ancillary and laboratory) are listed below for reference.    Significant Diagnostic Studies: Dg Chest Port 1 View  06/16/2012  *RADIOLOGY REPORT*  Clinical Data: Mid chest pain, radiating to the jaw.  PORTABLE CHEST - 1 VIEW  Comparison: None.  Findings: The lungs are well-aerated.  Minimal left basilar density likely reflects overlying soft tissues.  Mild vascular congestion is noted, without significant pulmonary edema.  There is no evidence of , pleural effusion or pneumothorax.  The cardiomediastinal silhouette is within normal limits.  No acute osseous abnormalities are seen.  IMPRESSION: Mild vascular congestion noted; lungs remain grossly clear.   Original Report Authenticated By: Tonia Ghent, M.D.     Microbiology: No results found  for this or any previous visit (from the past 240 hour(s)).   Labs: Basic Metabolic Panel:  Recent Labs Lab 06/16/12 0315 06/17/12 0510  NA 139 138  K 3.1* 4.6  CL 98 102  CO2 27 27  GLUCOSE 110* 90  BUN 9 8  CREATININE 0.70 0.54  CALCIUM 9.3 9.1   Liver Function Tests:  Recent Labs Lab 06/16/12 0329  AST 13  ALT 8  ALKPHOS 89  BILITOT 0.4  PROT 7.7  ALBUMIN 4.3    Recent Labs Lab 06/16/12 0329  LIPASE 25   No results found for this basename: AMMONIA,  in the last 168 hours CBC:  Recent Labs Lab 06/16/12 0315 06/17/12 0510  WBC 14.0* 11.0*  HGB 17.5* 15.2*  HCT 50.7* 43.6  MCV 88.5 86.9  PLT 219 174   Cardiac Enzymes:  Recent Labs Lab 06/16/12 0315 06/16/12 0917 06/16/12 1648 06/16/12 2023 06/17/12 0945  CKTOTAL  --   --   --   --  201*  CKMB  --   --   --   --  16.7*  TROPONINI <0.30 1.03* 3.00*  3.88*  --    BNP: BNP (last 3 results) No results found for this basename: PROBNP,  in the last 8760 hours CBG: No results found for this basename: GLUCAP,  in the last 168 hours     Signed:  ELMAHI,MUTAZ A  [facility] 06/17/2012, 1:33 PM

## 2012-06-30 ENCOUNTER — Telehealth: Payer: Self-pay | Admitting: Nurse Practitioner

## 2012-06-30 MED ORDER — LISINOPRIL 5 MG PO TABS: 5.0000 mg | ORAL_TABLET | Freq: Every day | ORAL | Status: DC

## 2012-06-30 NOTE — Telephone Encounter (Signed)
I would stop amlodipine. Patient was hypertensive in the hospital- I would start lisinopril 5 mg daily until she is seen in the office.  Dynasti Kerman Swaziland MD, [facility]

## 2012-06-30 NOTE — Addendum Note (Signed)
Addended by: Antony Odea on: 06/30/2012 04:52 PM   Modules accepted: Orders, Medications

## 2012-06-30 NOTE — Telephone Encounter (Signed)
Pt feels Norvasc is causing her to vomit. She has tried taking it with food and morning/ evening. She has vomited for a week. Told her I will pass msg along and she will here back tomorrow 3/19 with advise. I will forward to L.Gerhardt np / cma to review. Pt has post hosp visit 07/08/12

## 2012-06-30 NOTE — Telephone Encounter (Signed)
Pt was called and medication was stopped/ new med ordered, pt verbalized understanding. MAR adjusted and Norvasc placed on allergy list.

## 2012-06-30 NOTE — Telephone Encounter (Signed)
Plz return call to patient at  [phone], regarding problems with Norvasc medication which is causing her to vomit.

## 2012-07-08 ENCOUNTER — Ambulatory Visit (INDEPENDENT_AMBULATORY_CARE_PROVIDER_SITE_OTHER): Payer: No Typology Code available for payment source | Admitting: Nurse Practitioner

## 2012-07-08 ENCOUNTER — Encounter: Payer: Self-pay | Admitting: Nurse Practitioner

## 2012-07-08 VITALS — BP 102/74 | HR 72 | Ht 69.0 in | Wt 156.0 lb

## 2012-07-08 DIAGNOSIS — I214 Non-ST elevation (NSTEMI) myocardial infarction: Secondary | ICD-10-CM

## 2012-07-08 NOTE — Patient Instructions (Addendum)
I think you are doing well  Try to do as much as you can  Congratulations for stopping smoking  Get a blood pressure cuff and start checking your blood pressure at various times and keep a diary. Goal is to stay below 135/85.  See Dr.Jordan in 8 weeks  Call the [facility] office at [phone] if you have any questions, problems or concerns.

## 2012-07-08 NOTE — Progress Notes (Signed)
Heidi Green Date of Birth: [birth year] Medical Record #[id number]  History of Present Illness: Heidi Green is seen back today for a post hospital visit. She is seen for Dr. Swaziland. She has a history of tobacco abuse, HTN, HLD, depression, chronic back pain with multiple surgeries.  Has foot drop and on chronic narcotics.  Recently admitted with chest pain. Troponin was mildly elevated. She was cathed which showed no obstructive CAD. Peak troponin was 3.88. She was hypertensive upon her arrival to the hospital.   She comes in today. She is here with family. She is doing ok. Has stopped smoking. Wants to stop her heart medicine. Only on low dose ACE and her chronic narcotics. Very limited by her back and legs. Balance is unsteady. She falls. Only uses a cane. Complains of long standing nausea and vomiting. No longer seeing her PCP - says that was too expensive. Followed in the Pain Clinic. No more chest pain. Does not check her blood pressure at home.   Current Outpatient Prescriptions on File Prior to Visit  Medication Sig Dispense Refill  . aspirin 81 MG tablet Take 1 tablet (81 mg total) by mouth daily.  30 tablet  0  . fentaNYL (DURAGESIC - DOSED MCG/HR) 75 MCG/HR Place 1 patch onto the skin every other day.       Marland Kitchen HYDROcodone-acetaminophen (NORCO) 10-325 MG per tablet Take 1 tablet by mouth every 6 (six) hours as needed for pain.      Marland Kitchen lisinopril (PRINIVIL,ZESTRIL) 5 MG tablet Take 1 tablet (5 mg total) by mouth daily.  30 tablet  3  . rOPINIRole (REQUIP) 2 MG tablet Take 2 mg by mouth 2 (two) times daily.       No current facility-administered medications on file prior to visit.    Allergies  Allergen Reactions  . Norvasc (Amlodipine Besylate) Nausea And Vomiting  . Garamycin (Gentamicin Sulfate)   . Penicillins     Past Medical History  Diagnosis Date  . Back pain   . Chest pain 06/16/2012    negative cath with normal LV function  . Hypertension   . Kidney stones   . Headache     . Arthritis     in back    Past Surgical History  Procedure Laterality Date  . Tubal ligation    . Lithotripsy    . Abdominal hysterectomy    . Back surgery      History  Smoking status  . Former Smoker -- 0.50 packs/day for 26 years  . Types: Cigarettes  . Quit date: 06/17/2012  Smokeless tobacco  . Never Used    History  Alcohol Use No    Family History  Problem Relation Age of Onset  . Emphysema Mother   . Diabetes Mother     Review of Systems: The review of systems is per the HPI.  All other systems were reviewed and are negative.  Physical Exam: BP 102/74  Pulse 72  Ht 5\' 9"  (1.753 m)  Wt 156 lb (70.761 kg)  BMI 23.03 kg/m2 Patient is alert and in no acute distress. Affect is pretty flat. Does not seem very happy in general. Skin is warm and dry. Color is normal.  HEENT is unremarkable. Normocephalic/atraumatic. PERRL. Sclera are nonicteric. Neck is supple. No masses. No JVD. Lungs are coarse. Cardiac exam shows a regular rate and rhythm. Abdomen is soft. Extremities are without edema. Gait and ROM are intact. No gross neurologic deficits noted. Right wrist looks  ok. She is using a cane.   LABORATORY DATA:  Lab Results  Component Value Date   WBC 11.0* 06/17/2012   HGB 15.2* 06/17/2012   HCT 43.6 06/17/2012   PLT 174 06/17/2012   GLUCOSE 90 06/17/2012   CHOL 159 06/16/2012   TRIG 251* 06/16/2012   HDL 33* 06/16/2012   LDLCALC 76 06/16/2012   ALT 8 06/16/2012   AST 13 06/16/2012   NA 138 06/17/2012   K 4.6 06/17/2012   CL 102 06/17/2012   CREATININE 0.54 06/17/2012   BUN 8 06/17/2012   CO2 27 06/17/2012   TSH 3.164 06/16/2012   INR 1.05 06/16/2012   Lab Results  Component Value Date   CKTOTAL 201* 06/17/2012   CKMB 16.7* 06/17/2012   TROPONINI 3.88* 06/16/2012   Coronary angiography:  Coronary dominance: right  Left mainstem: Normal  Left anterior descending (LAD): Very large vessel. Focal 20% mid LAD. The first diagonal is a large bifurcating vessel. The smaller superior branch  has an abrupt caliber change with tapering distally but no significant stenosis seen.  Left circumflex (LCx): Normal.  Right coronary artery (RCA): Large dominant vessel. 10% disease distally.  Left ventriculography: Left ventricular systolic function is normal, LVEF is estimated at 55-65%, there is no significant mitral regurgitation   Final Conclusions:   1. No significant obstructive coronary artery disease.  2. Normal LV function.  Recommendations: Medical management.  Heidi Green Heidi Green  06/16/2012, 3:10 PM   Echo Study Conclusions  - Left ventricle: The cavity size was normal. Wall thickness was increased in a pattern of mild LVH. Systolic function was normal. The estimated ejection fraction was in the range of 60% to 65%. Wall motion was normal; there were no regional wall motion abnormalities. Doppler parameters are consistent with abnormal left ventricular relaxation (grade 1 diastolic dysfunction). - Aortic valve: There was no stenosis. - Mitral valve: Trivial regurgitation. - Left atrium: The atrium was mildly dilated. - Right ventricle: The cavity size was normal. Systolic function was normal. - Pulmonary arteries: PA peak pressure: 26mm Hg (S). - Inferior vena cava: The vessel was normal in size; the respirophasic diameter changes were in the normal range (= 50%); findings are consistent with normal central venous pressure.  Assessment / Plan: 1. Recent NSTEMI - with no obstructive disease noted on cath with normal LV function - possible vasospasm - needs CV risk factor modification - she has stopped smoking and I have encouraged her to continue to abstain.   2. Tobacco abuse - resolved at this time. Hopefully she will be able to maintain.   3. Chronic back pain - this seems to be her most limiting factor - not able to be active.   4. HTN - blood pressure looks good. I have asked her to monitor at home. It was pretty high in the hospital. I would suspect that her  use of narcotics will affect this as well.   5. HLD - needs dietary changes.   Patient is agreeable to this plan and will call if any problems develop in the interim.   Heidi Macadamia, RN, ANP-C [facility] [address] [place], [place]  [address]

## 2012-09-08 ENCOUNTER — Ambulatory Visit: Payer: No Typology Code available for payment source | Admitting: Cardiology

## 2012-11-12 ENCOUNTER — Encounter: Payer: Self-pay | Admitting: Cardiology

## 2012-11-12 ENCOUNTER — Ambulatory Visit (INDEPENDENT_AMBULATORY_CARE_PROVIDER_SITE_OTHER): Payer: No Typology Code available for payment source | Admitting: Cardiology

## 2012-11-12 VITALS — BP 120/82 | HR 53 | Ht 69.0 in | Wt 152.1 lb

## 2012-11-12 DIAGNOSIS — I214 Non-ST elevation (NSTEMI) myocardial infarction: Secondary | ICD-10-CM

## 2012-11-12 DIAGNOSIS — Z720 Tobacco use: Secondary | ICD-10-CM

## 2012-11-12 DIAGNOSIS — I10 Essential (primary) hypertension: Secondary | ICD-10-CM

## 2012-11-12 DIAGNOSIS — F17200 Nicotine dependence, unspecified, uncomplicated: Secondary | ICD-10-CM

## 2012-11-12 NOTE — Progress Notes (Signed)
Heidi Green Date of Birth: 1955-12-17 Medical Record #[id number]  History of Present Illness: Heidi Green is seen back today for followup. She has a history of tobacco abuse, HTN, HLD, depression, chronic back pain with multiple surgeries.  Has foot drop and on chronic narcotics.  She was admitted in March with chest pain. Her troponin was mildly elevated. Subsequent cardiac catheterization showed minimal nonobstructive coronary disease. She was hypertensive on her presentation. Since that hospitalization she has had no further chest pain. She has quit smoking. She does complain of frequent nausea and vomiting that may last for days. She is on chronic narcotics. Activity is limited because of her back issues and she walks with a cane.  Current Outpatient Prescriptions on File Prior to Visit  Medication Sig Dispense Refill  . aspirin 81 MG tablet Take 1 tablet (81 mg total) by mouth daily.  30 tablet  0  . lisinopril (PRINIVIL,ZESTRIL) 5 MG tablet Take 1 tablet (5 mg total) by mouth daily.  30 tablet  3  . rOPINIRole (REQUIP) 2 MG tablet Take 2 mg by mouth 2 (two) times daily.       No current facility-administered medications on file prior to visit.    Allergies  Allergen Reactions  . Norvasc (Amlodipine Besylate) Nausea And Vomiting  . Garamycin (Gentamicin Sulfate)   . Penicillins     Past Medical History  Diagnosis Date  . Back pain   . Chest pain 06/16/2012    negative cath with normal LV function  . Hypertension   . Kidney stones   . Headache(784.0)   . Arthritis     in back    Past Surgical History  Procedure Laterality Date  . Tubal ligation    . Lithotripsy    . Abdominal hysterectomy    . Back surgery      History  Smoking status  . Former Smoker -- 0.50 packs/day for 26 years  . Types: Cigarettes  . Quit date: 06/17/2012  Smokeless tobacco  . Never Used    History  Alcohol Use No    Family History  Problem Relation Age of Onset  . Emphysema Mother   .  Diabetes Mother     Review of Systems: The review of systems is per the HPI.  All other systems were reviewed and are negative.  Physical Exam: BP 120/82  Pulse 53  Ht 5\' 9"  (1.753 m)  Wt 152 lb 1.9 oz (69.001 kg)  BMI 22.45 kg/m2  SpO2 96% Patient is alert and in no acute distress. Affect is pretty flat.  Skin is warm and dry. Color is normal.  HEENT is unremarkable. Normocephalic/atraumatic. PERRL. Sclera are nonicteric. Neck is supple. No masses. No JVD. Lungs are coarse. Cardiac exam shows a regular rate and rhythm. Abdomen is soft. Extremities are without edema. Gait and ROM are intact. No gross neurologic deficits noted. She is using a cane.   LABORATORY DATA:  Lab Results  Component Value Date   WBC 11.0* 06/17/2012   HGB 15.2* 06/17/2012   HCT 43.6 06/17/2012   PLT 174 06/17/2012   GLUCOSE 90 06/17/2012   CHOL 159 06/16/2012   TRIG 251* 06/16/2012   HDL 33* 06/16/2012   LDLCALC 76 06/16/2012   ALT 8 06/16/2012   AST 13 06/16/2012   NA 138 06/17/2012   K 4.6 06/17/2012   CL 102 06/17/2012   CREATININE 0.54 06/17/2012   BUN 8 06/17/2012   CO2 27 06/17/2012   TSH  3.164 06/16/2012   INR 1.05 06/16/2012   Lab Results  Component Value Date   CKTOTAL 201* 06/17/2012   CKMB 16.7* 06/17/2012   TROPONINI 3.88* 06/16/2012   Coronary angiography:  Coronary dominance: right  Left mainstem: Normal  Left anterior descending (LAD): Very large vessel. Focal 20% mid LAD. The first diagonal is a large bifurcating vessel. The smaller superior branch has an abrupt caliber change with tapering distally but no significant stenosis seen.  Left circumflex (LCx): Normal.  Right coronary artery (RCA): Large dominant vessel. 10% disease distally.  Left ventriculography: Left ventricular systolic function is normal, LVEF is estimated at 55-65%, there is no significant mitral regurgitation   Final Conclusions:   1. No significant obstructive coronary artery disease.  2. Normal LV function.  Recommendations: Medical  management.  Theron Arista [facility]  06/16/2012, 3:10 PM   Echo Study Conclusions  - Left ventricle: The cavity size was normal. Wall thickness was increased in a pattern of mild LVH. Systolic function was normal. The estimated ejection fraction was in the range of 60% to 65%. Wall motion was normal; there were no regional wall motion abnormalities. Doppler parameters are consistent with abnormal left ventricular relaxation (grade 1 diastolic dysfunction). - Aortic valve: There was no stenosis. - Mitral valve: Trivial regurgitation. - Left atrium: The atrium was mildly dilated. - Right ventricle: The cavity size was normal. Systolic function was normal. - Pulmonary arteries: PA peak pressure: 26mm Hg (S). - Inferior vena cava: The vessel was normal in size; the respirophasic diameter changes were in the normal range (= 50%); findings are consistent with normal central venous pressure.  Assessment / Plan: 1. History of chest pain with abnormal troponin- with no obstructive disease noted on cath with normal LV function - possible vasospasm - needs CV risk factor modification - she has stopped smoking and I have encouraged her to continue to abstain.   2. Tobacco abuse - resolved at this time.   3. Chronic back pain - this seems to be her most limiting factor - not able to be active.   4. HTN - blood pressure looks good. Currently on low-dose ACE inhibitor.  5. HLD -   6. Chronic nausea vomiting. Possibly related to narcotics. I recommended Prilosec OTC to see if this will help. Patient has no interest in upper GI evaluation.

## 2012-11-12 NOTE — Patient Instructions (Signed)
Continue your current therapy  You should try prilosec OTC to see if it will help your nausea.  Congratulations on quitting smoking.  I will see you in 6 months.

## 2012-11-14 ENCOUNTER — Other Ambulatory Visit: Payer: Self-pay | Admitting: Cardiology

## 2013-03-29 ENCOUNTER — Other Ambulatory Visit: Payer: Self-pay | Admitting: Cardiology

## 2013-06-01 ENCOUNTER — Other Ambulatory Visit: Payer: Self-pay | Admitting: Cardiology

## 2013-07-20 ENCOUNTER — Encounter: Payer: Self-pay | Admitting: Cardiology

## 2014-03-24 ENCOUNTER — Encounter (HOSPITAL_COMMUNITY): Payer: Self-pay | Admitting: Cardiology

## 2016-03-10 ENCOUNTER — Encounter (HOSPITAL_BASED_OUTPATIENT_CLINIC_OR_DEPARTMENT_OTHER): Payer: Self-pay | Admitting: Adult Health

## 2016-03-10 ENCOUNTER — Emergency Department (HOSPITAL_BASED_OUTPATIENT_CLINIC_OR_DEPARTMENT_OTHER)
Admission: EM | Admit: 2016-03-10 | Discharge: 2016-03-11 | Disposition: A | Discharge status: Home / Self Care | Payer: Medicare Other | Attending: Emergency Medicine | Admitting: Emergency Medicine

## 2016-03-10 ENCOUNTER — Emergency Department (HOSPITAL_BASED_OUTPATIENT_CLINIC_OR_DEPARTMENT_OTHER): Payer: Medicare Other

## 2016-03-10 DIAGNOSIS — Z7982 Long term (current) use of aspirin: Secondary | ICD-10-CM | POA: Diagnosis not present

## 2016-03-10 DIAGNOSIS — S39012A Strain of muscle, fascia and tendon of lower back, initial encounter: Secondary | ICD-10-CM | POA: Diagnosis not present

## 2016-03-10 DIAGNOSIS — W1839XA Other fall on same level, initial encounter: Secondary | ICD-10-CM | POA: Insufficient documentation

## 2016-03-10 DIAGNOSIS — Z87891 Personal history of nicotine dependence: Secondary | ICD-10-CM | POA: Insufficient documentation

## 2016-03-10 DIAGNOSIS — I10 Essential (primary) hypertension: Secondary | ICD-10-CM | POA: Insufficient documentation

## 2016-03-10 DIAGNOSIS — Y999 Unspecified external cause status: Secondary | ICD-10-CM | POA: Diagnosis not present

## 2016-03-10 DIAGNOSIS — Y929 Unspecified place or not applicable: Secondary | ICD-10-CM | POA: Diagnosis not present

## 2016-03-10 DIAGNOSIS — Z79899 Other long term (current) drug therapy: Secondary | ICD-10-CM | POA: Diagnosis not present

## 2016-03-10 DIAGNOSIS — Y939 Activity, unspecified: Secondary | ICD-10-CM | POA: Diagnosis not present

## 2016-03-10 DIAGNOSIS — S3992XA Unspecified injury of lower back, initial encounter: Secondary | ICD-10-CM | POA: Insufficient documentation

## 2016-03-10 LAB — URINALYSIS, ROUTINE W REFLEX MICROSCOPIC
BILIRUBIN URINE: NEGATIVE
GLUCOSE, UA: NEGATIVE mg/dL
HGB URINE DIPSTICK: NEGATIVE
Ketones, ur: NEGATIVE mg/dL
Leukocytes, UA: NEGATIVE
Nitrite: NEGATIVE
PH: 8 (ref 5.0–8.0)
Protein, ur: NEGATIVE mg/dL
SPECIFIC GRAVITY, URINE: 1.015 (ref 1.005–1.030)

## 2016-03-10 MED ORDER — MORPHINE SULFATE (PF) 4 MG/ML IV SOLN
4.0000 mg | Freq: Once | INTRAVENOUS | Status: AC
  Administered 2016-03-10: 4 mg via INTRAMUSCULAR
  Filled 2016-03-10: qty 1

## 2016-03-10 NOTE — ED Triage Notes (Signed)
Presents with right sided flank pain and urinary frequency that began today associated with foul smelling urine and dark colored urine and dizziness. Pt denies nausea and vomiting and fever. Denies chills. Denies confusion.

## 2016-03-10 NOTE — ED Provider Notes (Signed)
[facility] Provider Note   CSN: [id number] Arrival date & time: 03/10/16  2003  By signing my name below, I, Rosario AdieWilliam Andrew Hiatt, attest that this documentation has been prepared under the direction and in the presence of Nira ConnPedro Eduardo Kosha Jaquith, MD. Electronically Signed: Rosario AdieWilliam Andrew Hiatt, ED Scribe. 03/10/16. 9:59 PM.  History   Chief Complaint Chief Complaint  Patient presents with  . Flank Pain   The history is provided by the patient. No language interpreter was used.    HPI Comments: Heidi Green is a 60 y.o. female who presents to the Emergency Department complaining of constant right-sided flank and centralized lower back pain onset this morning. No radiation of pain. Pt did not wake up from her sleep with her pain, and notes that she was sitting down to have a urine void during the onset of her pain. She reports associated urinary frequency, urgency, and malodorous urine secondary to her current pain. Pt also reports that she has had issues with bladder urgency over the past six months without incontinence. She additionally notes that prior to the onset of her pain that she had three ground-level, forward falls over the past week, most recently yesterday morning. However, she denies her pain being exacerbated due to this. Pt has a h/o chronic back pain and prior renal calculi and notes that her pain feels similar to both of these previous issues. She has been using 25mcg Fentanyl patches and Hydrocodone 5-325mg  prior to coming into the ED with minimal relief of her current pain. Her pain is exacerbated with standing up straight or with ambulation. Pt denies dysuria, hematuria, numbness, weakness, bowel incontinence, or any other associated symptoms.   Past Medical History:  Diagnosis Date  . Arthritis    in back  . Back pain   . Chest pain 06/16/2012   negative cath with normal LV function  . Headache(784.0)   . Hypertension   . Kidney stones    Patient Active  Problem List   Diagnosis Date Noted  . NSTEMI (non-ST elevated myocardial infarction) (HCC) 06/17/2012  . Chest pain 06/16/2012  . HTN (hypertension) 06/16/2012  . Dyslipidemia 06/16/2012  . Low back pain 06/16/2012  . Tobacco abuse 06/16/2012   Past Surgical History:  Procedure Laterality Date  . ABDOMINAL HYSTERECTOMY    . BACK SURGERY    . LEFT HEART CATHETERIZATION WITH CORONARY ANGIOGRAM N/A 06/16/2012   Procedure: LEFT HEART CATHETERIZATION WITH CORONARY ANGIOGRAM;  Surgeon: Peter M SwazilandJordan, MD;  [facility];  Service: Cardiovascular;  Laterality: N/A;  . LITHOTRIPSY    . TUBAL LIGATION     OB History    No data available     Home Medications    Prior to Admission medications   Medication Sig Start Date End Date Taking? Authorizing Provider  aspirin 81 MG tablet Take 1 tablet (81 mg total) by mouth daily. 06/17/12   Clydia LlanoMutaz Elmahi, MD  lisinopril (PRINIVIL,ZESTRIL) 5 MG tablet TAKE 1 TABLET BY MOUTH EVERY DAY *REPLACES NORVASC*    Peter M SwazilandJordan, MD  morphine (MS CONTIN) 30 MG 12 hr tablet Take 30 mg by mouth as directed.  10/22/12   Historical Provider, MD  oxycodone (ROXICODONE) 30 MG immediate release tablet Take 30 mg by mouth as directed.  10/22/12   Historical Provider, MD  rOPINIRole (REQUIP) 2 MG tablet Take 2 mg by mouth 2 (two) times daily.    Historical Provider, MD   Family History Family History  Problem  Relation Age of Onset  . Emphysema Mother   . Diabetes Mother    Social History Social History  Substance Use Topics  . Smoking status: Former Smoker    Packs/day: 0.50    Years: 26.00    Types: Cigarettes    Quit date: 06/17/2012  . Smokeless tobacco: Never Used  . Alcohol use No   Allergies   Norvasc [amlodipine besylate]; Garamycin [gentamicin sulfate]; and Penicillins  Review of Systems Review of Systems  Genitourinary: Positive for flank pain (right-sided), frequency and urgency.  Musculoskeletal: Positive for back pain.  Neurological:  Negative for weakness and numbness.       Positive for bladder incontinence. Negative for bowel incontinence.   All other systems reviewed and are negative.  Physical Exam Updated Vital Signs BP 169/99 (BP Location: Left Arm)   Pulse 87   Temp 98 F (36.7 C) (Oral)   Resp 20   Ht 5\' 9"  (1.753 m)   Wt 220 lb (99.8 kg)   SpO2 97%   BMI 32.49 kg/m   Physical Exam  Constitutional: She is oriented to person, place, and time. She appears well-developed and well-nourished. No distress.  HENT:  Head: Normocephalic and atraumatic.  Nose: Nose normal.  Eyes: Conjunctivae and EOM are normal. Pupils are equal, round, and reactive to light. Right eye exhibits no discharge. Left eye exhibits no discharge. No scleral icterus.  Neck: Normal range of motion. Neck supple.  Cardiovascular: Normal rate and regular rhythm.  Exam reveals no gallop and no friction rub.   No murmur heard. Pulmonary/Chest: Effort normal and breath sounds normal. No stridor. No respiratory distress. She has no rales.  Abdominal: Soft. She exhibits no distension. There is no tenderness. There is no CVA tenderness.  Musculoskeletal: She exhibits tenderness. She exhibits no edema.  TTP along the lumbar paraspinal muscles. No midline spinal tenderness.   Neurological: She is alert and oriented to person, place, and time.  Spine Exam: Strength: 5/5 throughout LE bilaterally (hip flexion/extension, adduction/abduction; knee flexion/extension; foot dorsiflexion/plantarflexion, inversion/eversion; great toe inversion) Sensation: Intact to light touch in proximal and distal LE bilaterally Reflexes: 1+ quadriceps and achilles reflexes  Skin: Skin is warm and dry. No rash noted. She is not diaphoretic. No erythema.  Psychiatric: She has a normal mood and affect.  Vitals reviewed.  ED Treatments / Results  DIAGNOSTIC STUDIES: Oxygen Saturation is 99% on RA, normal by my interpretation.   COORDINATION OF CARE: 9:59  PM-Discussed next steps with pt. Pt verbalized understanding and is agreeable with the plan.   Labs (all labs ordered are listed, but only abnormal results are displayed) Labs Reviewed  URINALYSIS, ROUTINE W REFLEX MICROSCOPIC (NOT AT [facility]) - Abnormal; Notable for the following:       Result Value   APPearance CLOUDY (*)    All other components within normal limits   EKG  EKG Interpretation None      Radiology Dg Lumbar Spine Complete  Result Date: 03/10/2016 CLINICAL DATA:  Acute low back pain EXAM: LUMBAR SPINE - COMPLETE 4+ VIEW COMPARISON:  CT 02/28/2008 FINDINGS: Mild scoliosis of the lumbar spine, apex to the patient's left. Osseous fusion at L4-L5 and L5-S1. Mild straightening of the lumbar spine. Vertebral body heights are maintained. Moderate narrowing at L3-L4 with endplate changes. Dense atherosclerosis of the aorta. Curvilinear calcifications in the left upper quadrant, likely correspond to splenic artery aneurysm by prior CT. IMPRESSION: Degenerative changes of the lumbar spine with fusion of L5 through S1. No  definite acute osseous abnormality. Atherosclerosis of the aorta. 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 Electronically Signed   By: Jasmine PangKim  Fujinaga M.D.   On: 03/10/2016 23:40    Procedures Procedures  Emergency Focused Ultrasound Exam Limited Retroperitoneal Ultrasound of Kidneys and Bladder  Performed and interpreted by Dr. Eudelia Bunchardama Focused abdominal ultrasound with both kidneys and bladder imaged in transverse and longitudinal planes in real-time. Indication: flank pain Findings: bilateral kidneys present, no shadowing, no anechoic areas Interpretation: no hydronephrosis visualized.  no stones or cysts visualized  Images archived electronically  CPT Code: 9528476775  Emergency Focused Ultrasound Exam Limited Retroperitoneal Ultrasound of the Abdominal Aorta.   Performed and interpreted by Dr. Eudelia Bunchardama Indication: abdominal  pain Multiple views of the abdominal aorta are obtained from the diaphragmatic hiatus to the aortic bifurcation in transverse and sagittal planes with a multi-frequency probe.  Findings: largest dimensions 2.3x2.3, no dissection flap noted Interpretation: no abdominal aortic aneurysm, no dissection visualized, no signs of impending rupture Images archived electronically.  CPT Code: [id number]   Medications Ordered in ED Medications  morphine 4 MG/ML injection 4 mg (4 mg Intramuscular Given 03/10/16 2316)  dexamethasone (DECADRON) injection 10 mg (10 mg Intramuscular Given 03/11/16 0030)    Initial Impression / Assessment and Plan / ED Course  I have reviewed the triage vital signs and the nursing notes.  Pertinent labs & imaging results that were available during my care of the patient were reviewed by me and considered in my medical decision making (see chart for details).  Clinical Course    Presentation likely exacerbation of patient's chronic back pain from MSK etiology given exam findings. Plain film without evidence of acute fractures or dislocations. No evidence of hydronephrosis or AAA on bedside ultrasound. UA without evidence of urinary tract infection or hematuria.  Provided with IM pain medicine and steroids. Patient is followed by a chronic pain doctor. She was instructed to follow up with them for further evaluation and management of her chronic pain.  Final Clinical Impressions(s) / ED Diagnoses   Final diagnoses:  Strain of lumbar region, initial encounter   Disposition: Discharge  Condition: Good  I have discussed the results, Dx and Tx plan with the patient who expressed understanding and agree(s) with the plan. Discharge instructions discussed at great length. The patient was given strict return precautions who verbalized understanding of the instructions. No further questions at time of discharge.    Discharge Medication List as of 03/11/2016 12:17 AM       Follow Up: Karle PlumberMoogali M Arvind, MD [phone] [facility] [place] [id number] [phone]  Schedule an appointment as soon as possible for a visit in 1 day For close follow up to assess for pain control   I personally performed the services described in this documentation, which was scribed in my presence. The recorded information has been reviewed and is accurate.         Nira ConnPedro Eduardo Dorethea Strubel, MD 03/11/16 [phone]

## 2016-03-11 MED ORDER — DEXAMETHASONE SODIUM PHOSPHATE 10 MG/ML IJ SOLN
10.0000 mg | Freq: Once | INTRAMUSCULAR | Status: AC
  Administered 2016-03-11: 10 mg via INTRAMUSCULAR
  Filled 2016-03-11: qty 1

## 2016-07-18 ENCOUNTER — Encounter (HOSPITAL_COMMUNITY): Payer: Self-pay | Admitting: Emergency Medicine

## 2016-07-18 ENCOUNTER — Emergency Department (HOSPITAL_COMMUNITY): Payer: Medicare HMO

## 2016-07-18 ENCOUNTER — Observation Stay (HOSPITAL_COMMUNITY)
Admission: EM | Admit: 2016-07-18 | Discharge: 2016-07-19 | Disposition: A | Discharge status: Home / Self Care | Payer: Medicare HMO | Attending: Internal Medicine | Admitting: Internal Medicine

## 2016-07-18 DIAGNOSIS — G8929 Other chronic pain: Secondary | ICD-10-CM | POA: Diagnosis not present

## 2016-07-18 DIAGNOSIS — I10 Essential (primary) hypertension: Secondary | ICD-10-CM | POA: Diagnosis present

## 2016-07-18 DIAGNOSIS — E66812 Obesity, class 2: Secondary | ICD-10-CM

## 2016-07-18 DIAGNOSIS — M545 Low back pain: Secondary | ICD-10-CM | POA: Insufficient documentation

## 2016-07-18 DIAGNOSIS — Z79891 Long term (current) use of opiate analgesic: Secondary | ICD-10-CM | POA: Diagnosis not present

## 2016-07-18 DIAGNOSIS — R410 Disorientation, unspecified: Secondary | ICD-10-CM | POA: Insufficient documentation

## 2016-07-18 DIAGNOSIS — Z87891 Personal history of nicotine dependence: Secondary | ICD-10-CM | POA: Diagnosis not present

## 2016-07-18 DIAGNOSIS — G9340 Encephalopathy, unspecified: Secondary | ICD-10-CM | POA: Diagnosis not present

## 2016-07-18 DIAGNOSIS — Z79899 Other long term (current) drug therapy: Secondary | ICD-10-CM | POA: Insufficient documentation

## 2016-07-18 DIAGNOSIS — R451 Restlessness and agitation: Secondary | ICD-10-CM

## 2016-07-18 DIAGNOSIS — M549 Dorsalgia, unspecified: Secondary | ICD-10-CM | POA: Diagnosis present

## 2016-07-18 DIAGNOSIS — E669 Obesity, unspecified: Secondary | ICD-10-CM | POA: Insufficient documentation

## 2016-07-18 DIAGNOSIS — Z6836 Body mass index (BMI) 36.0-36.9, adult: Secondary | ICD-10-CM | POA: Insufficient documentation

## 2016-07-18 DIAGNOSIS — Z7982 Long term (current) use of aspirin: Secondary | ICD-10-CM | POA: Diagnosis not present

## 2016-07-18 LAB — CBC WITH DIFFERENTIAL/PLATELET
BASOS ABS: 0 10*3/uL (ref 0.0–0.1)
Basophils Absolute: 0 10*3/uL (ref 0.0–0.1)
Basophils Relative: 0 %
Basophils Relative: 0 %
EOS ABS: 0.1 10*3/uL (ref 0.0–0.7)
EOS ABS: 0.1 10*3/uL (ref 0.0–0.7)
Eosinophils Relative: 1 %
Eosinophils Relative: 1 %
HCT: 38.9 % (ref 36.0–46.0)
HCT: 43 % (ref 36.0–46.0)
HEMOGLOBIN: 12.6 g/dL (ref 12.0–15.0)
Hemoglobin: 14.1 g/dL (ref 12.0–15.0)
LYMPHS ABS: 3.9 10*3/uL (ref 0.7–4.0)
Lymphocytes Relative: 29 %
Lymphocytes Relative: 29 %
Lymphs Abs: 3.2 10*3/uL (ref 0.7–4.0)
MCH: 28.9 pg (ref 26.0–34.0)
MCH: 29.4 pg (ref 26.0–34.0)
MCHC: 32.4 g/dL (ref 30.0–36.0)
MCHC: 32.8 g/dL (ref 30.0–36.0)
MCV: 89.2 fL (ref 78.0–100.0)
MCV: 89.8 fL (ref 78.0–100.0)
MONOS PCT: 7 %
Monocytes Absolute: 0.7 10*3/uL (ref 0.1–1.0)
Monocytes Absolute: 0.8 10*3/uL (ref 0.1–1.0)
Monocytes Relative: 6 %
NEUTROS ABS: 8.5 10*3/uL — AB (ref 1.7–7.7)
NEUTROS PCT: 63 %
Neutro Abs: 7 10*3/uL (ref 1.7–7.7)
Neutrophils Relative %: 64 %
PLATELETS: 238 10*3/uL (ref 150–400)
PLATELETS: 243 10*3/uL (ref 150–400)
RBC: 4.36 MIL/uL (ref 3.87–5.11)
RBC: 4.79 MIL/uL (ref 3.87–5.11)
RDW: 14.5 % (ref 11.5–15.5)
RDW: 14.4 % (ref 11.5–15.5)
WBC: 11.1 10*3/uL — ABNORMAL HIGH (ref 4.0–10.5)
WBC: 13.3 10*3/uL — ABNORMAL HIGH (ref 4.0–10.5)

## 2016-07-18 LAB — HEPATIC FUNCTION PANEL
ALT: 13 U/L — ABNORMAL LOW (ref 14–54)
AST: 23 U/L (ref 15–41)
Albumin: 4.3 g/dL (ref 3.5–5.0)
Alkaline Phosphatase: 93 U/L (ref 38–126)
BILIRUBIN DIRECT: 0.1 mg/dL (ref 0.1–0.5)
BILIRUBIN INDIRECT: 0.7 mg/dL (ref 0.3–0.9)
Total Bilirubin: 0.8 mg/dL (ref 0.3–1.2)
Total Protein: 7.8 g/dL (ref 6.5–8.1)

## 2016-07-18 LAB — BASIC METABOLIC PANEL
ANION GAP: 8 (ref 5–15)
BUN: 19 mg/dL (ref 6–20)
CALCIUM: 9.1 mg/dL (ref 8.9–10.3)
CO2: 27 mmol/L (ref 22–32)
Chloride: 109 mmol/L (ref 101–111)
Creatinine, Ser: 0.83 mg/dL (ref 0.44–1.00)
Glucose, Bld: 92 mg/dL (ref 65–99)
Potassium: 3.5 mmol/L (ref 3.5–5.1)
Sodium: 144 mmol/L (ref 135–145)

## 2016-07-18 LAB — URINALYSIS, ROUTINE W REFLEX MICROSCOPIC
BILIRUBIN URINE: NEGATIVE
Bacteria, UA: NONE SEEN
Glucose, UA: NEGATIVE mg/dL
KETONES UR: NEGATIVE mg/dL
LEUKOCYTES UA: NEGATIVE
NITRITE: NEGATIVE
PH: 6 (ref 5.0–8.0)
Protein, ur: NEGATIVE mg/dL
Specific Gravity, Urine: 1.018 (ref 1.005–1.030)

## 2016-07-18 LAB — VITAMIN B12: Vitamin B-12: 1426 pg/mL — ABNORMAL HIGH (ref 180–914)

## 2016-07-18 LAB — MRSA PCR SCREENING: MRSA BY PCR: NEGATIVE

## 2016-07-18 LAB — RAPID URINE DRUG SCREEN, HOSP PERFORMED
AMPHETAMINES: NOT DETECTED
BARBITURATES: NOT DETECTED
Benzodiazepines: POSITIVE — AB
Cocaine: NOT DETECTED
Opiates: POSITIVE — AB
TETRAHYDROCANNABINOL: NOT DETECTED

## 2016-07-18 LAB — COMPREHENSIVE METABOLIC PANEL
ALBUMIN: 3.9 g/dL (ref 3.5–5.0)
ALT: 13 U/L — AB (ref 14–54)
AST: 22 U/L (ref 15–41)
Alkaline Phosphatase: 101 U/L (ref 38–126)
Anion gap: 9 (ref 5–15)
BUN: 25 mg/dL — AB (ref 6–20)
CHLORIDE: 109 mmol/L (ref 101–111)
CO2: 26 mmol/L (ref 22–32)
CREATININE: 1.09 mg/dL — AB (ref 0.44–1.00)
Calcium: 8.7 mg/dL — ABNORMAL LOW (ref 8.9–10.3)
GFR calc Af Amer: >60 mL/min (ref >60)
GFR, EST NON AFRICAN AMERICAN: 54 mL/min — AB (ref >60)
Glucose, Bld: 108 mg/dL — ABNORMAL HIGH (ref 65–99)
POTASSIUM: 3.5 mmol/L (ref 3.5–5.1)
SODIUM: 144 mmol/L (ref 135–145)
Total Bilirubin: 0.5 mg/dL (ref 0.3–1.2)
Total Protein: 7.1 g/dL (ref 6.5–8.1)

## 2016-07-18 LAB — BLOOD GAS, ARTERIAL
Acid-Base Excess: 1.1 mmol/L (ref 0.0–2.0)
BICARBONATE: 25 mmol/L (ref 20.0–28.0)
Drawn by: 232811
O2 Saturation: 96.6 %
PATIENT TEMPERATURE: 98.4
PO2 ART: 89.6 mmHg (ref 83.0–108.0)
pCO2 arterial: 39.3 mmHg (ref 32.0–48.0)
pH, Arterial: 7.42 (ref 7.350–7.450)

## 2016-07-18 LAB — GLUCOSE, CAPILLARY
GLUCOSE-CAPILLARY: 76 mg/dL (ref 65–99)
GLUCOSE-CAPILLARY: 125 mg/dL — AB (ref 65–99)
Glucose-Capillary: 101 mg/dL — ABNORMAL HIGH (ref 65–99)

## 2016-07-18 LAB — TSH: TSH: 3.239 u[IU]/mL (ref 0.350–4.500)

## 2016-07-18 LAB — AMMONIA: AMMONIA: 34 umol/L (ref 9–35)

## 2016-07-18 MED ORDER — LORAZEPAM 2 MG/ML IJ SOLN
1.0000 mg | Freq: Once | INTRAMUSCULAR | Status: AC
  Administered 2016-07-18: 1 mg via INTRAVENOUS
  Filled 2016-07-18: qty 1

## 2016-07-18 MED ORDER — ONDANSETRON HCL 4 MG/2ML IJ SOLN
4.0000 mg | Freq: Four times a day (QID) | INTRAMUSCULAR | Status: DC | PRN
Start: 2016-07-18 — End: 2016-07-19
  Administered 2016-07-18 – 2016-07-19 (×2): 4 mg via INTRAVENOUS
  Filled 2016-07-18 (×2): qty 2

## 2016-07-18 MED ORDER — MORPHINE SULFATE (PF) 4 MG/ML IV SOLN
2.0000 mg | INTRAVENOUS | Status: DC | PRN
  Administered 2016-07-18 (×4): 2 mg via INTRAVENOUS
  Filled 2016-07-18 (×4): qty 1

## 2016-07-18 MED ORDER — IOPAMIDOL (ISOVUE-300) INJECTION 61%
INTRAVENOUS | Status: AC
Start: 2016-07-18 — End: 2016-07-19
  Administered 2016-07-19: 100 mL
  Filled 2016-07-18: qty 100

## 2016-07-18 MED ORDER — HYDRALAZINE HCL 20 MG/ML IJ SOLN
10.0000 mg | INTRAMUSCULAR | Status: DC | PRN
  Filled 2016-07-18: qty 1

## 2016-07-18 MED ORDER — HYDRALAZINE HCL 20 MG/ML IJ SOLN: 10.0000 mg | INTRAMUSCULAR | Status: DC | PRN

## 2016-07-18 MED ORDER — ONDANSETRON HCL 4 MG PO TABS: 4.0000 mg | ORAL_TABLET | Freq: Four times a day (QID) | ORAL | Status: DC | PRN

## 2016-07-18 MED ORDER — MORPHINE SULFATE (PF) 2 MG/ML IV SOLN
2.0000 mg | INTRAVENOUS | Status: DC | PRN
  Filled 2016-07-18: qty 1

## 2016-07-18 MED ORDER — POTASSIUM CHLORIDE IN NACL 40-0.9 MEQ/L-% IV SOLN
INTRAVENOUS | Status: AC
  Administered 2016-07-18 – 2016-07-19 (×2): 75 mL/h via INTRAVENOUS
  Filled 2016-07-18 (×4): qty 1000

## 2016-07-18 MED ORDER — ROPINIROLE HCL 1 MG PO TABS
2.0000 mg | ORAL_TABLET | Freq: Two times a day (BID) | ORAL | Status: DC
  Administered 2016-07-18 – 2016-07-19 (×2): 2 mg via ORAL
  Filled 2016-07-18 (×2): qty 2

## 2016-07-18 MED ORDER — MORPHINE SULFATE (PF) 2 MG/ML IV SOLN
2.0000 mg | Freq: Once | INTRAVENOUS | Status: DC
  Filled 2016-07-18: qty 1

## 2016-07-18 MED ORDER — ACETAMINOPHEN 325 MG PO TABS: 650.0000 mg | ORAL_TABLET | Freq: Four times a day (QID) | ORAL | Status: DC | PRN

## 2016-07-18 MED ORDER — MORPHINE SULFATE (PF) 2 MG/ML IV SOLN
2.0000 mg | Freq: Once | INTRAVENOUS | Status: AC
  Administered 2016-07-18: 2 mg via INTRAVENOUS

## 2016-07-18 MED ORDER — ACETAMINOPHEN 650 MG RE SUPP: 650.0000 mg | Freq: Four times a day (QID) | RECTAL | Status: DC | PRN

## 2016-07-18 NOTE — ED Notes (Signed)
Took pt to CT stayed w pt ( provider in room )

## 2016-07-18 NOTE — ED Triage Notes (Signed)
Pt from home EMS arrived r/t call from husband because pt was having severe low back pain, 5 mg versed IM given at 0145 d/t pt out of control related to her pain. Combative at scene and unable to answer medical for EMT.

## 2016-07-18 NOTE — ED Provider Notes (Signed)
[facility]-EMERGENCY DEPT Provider Note: Heidi Dell, MD, FACEP  CSN: [id number] MRN: [id number] ARRIVAL: 07/18/16 at 0229 ROOM: WA16/WA16   CHIEF COMPLAINT  Altered Mental Status  Level 5 Caveat: altered mental status HISTORY OF PRESENT ILLNESS  Heidi Green is a 61 y.o. female on chronic hydrocodone for chronic back pain. She had previously been on fentanyl and oxycodone. Yesterday was unremarkable. She worked in the yard and painted. She complained about worsening back pain yesterday evening. About midnight she stated she needed to go to the bathroom but became confused and combative. EMS and law force but were summoned to insist in calling her down. She was given IM Versed but continued to be agitated and confused on arrival. She was incontinent of stool prior to arrival. No focal neurologic deficits were noted.  Her husband states she has chronic edema and erythema of the lower legs that has not acutely changed. He states she had a similar episode of confusion and agitation about a month ago that resolved on its own without intervention.   Past Medical History:  Diagnosis Date  . Arthritis    in back  . Back pain   . Chest pain 06/16/2012   negative cath with normal LV function  . Headache(784.0)   . Hypertension   . Kidney stones     Past Surgical History:  Procedure Laterality Date  . ABDOMINAL HYSTERECTOMY    . BACK SURGERY    . LEFT HEART CATHETERIZATION WITH CORONARY ANGIOGRAM N/A 06/16/2012   Procedure: LEFT HEART CATHETERIZATION WITH CORONARY ANGIOGRAM;  Surgeon: Peter M Swaziland, MD;  Location: [facility] [place] CATH LAB;  Service: Cardiovascular;  Laterality: N/A;  . LITHOTRIPSY    . TUBAL LIGATION      Family History  Problem Relation Age of Onset  . Emphysema Mother   . Diabetes Mother     Social History  Substance Use Topics  . Smoking status: Former Smoker    Packs/day: 0.50    Years: 26.00    Types: Cigarettes    Quit date: 06/17/2012  . Smokeless tobacco: Never Used  .  Alcohol use No    Prior to Admission medications   Medication Sig Start Date End Date Taking? Authorizing Provider  aspirin 81 MG tablet Take 1 tablet (81 mg total) by mouth daily. 06/17/12   Clydia Llano, MD  lisinopril (PRINIVIL,ZESTRIL) 5 MG tablet TAKE 1 TABLET BY MOUTH EVERY DAY *REPLACES NORVASC*    Peter M Swaziland, MD  morphine (MS CONTIN) 30 MG 12 hr tablet Take 30 mg by mouth as directed.  10/22/12   Historical Provider, MD  oxycodone (ROXICODONE) 30 MG immediate release tablet Take 30 mg by mouth as directed.  10/22/12   Historical Provider, MD  rOPINIRole (REQUIP) 2 MG tablet Take 2 mg by mouth 2 (two) times daily.    Historical Provider, MD    Allergies Garamycin [gentamicin sulfate]; Penicillins; and Norvasc [amlodipine besylate]   REVIEW OF SYSTEMS  Unable to obtain review of systems due to altered mental status.   PHYSICAL EXAMINATION  Initial Vital Signs Blood pressure (!) 147/84, pulse 85, temperature 98.6 F (37 C), temperature source Rectal, resp. rate 17, SpO2 92 %.  Examination General: Well-developed, well-nourished female; appearance consistent with age of record HENT: normocephalic; atraumatic Eyes: pupils equal, round and reactive to light; could not assess extraocular muscle function Neck: supple Heart: regular rate and rhythm Lungs: clear to auscultation bilaterally Abdomen: soft; nondistended; nontender; bowel sounds present Extremities: No deformity; full  range of motion; edema and erythema of lower legs Neurologic: Awake, confused, inappropriate responses to commands; motor function intact in all extremities and symmetric; no facial droop Skin: Warm and dry Psychiatric: Agitated   RESULTS  Summary of this visit's results, reviewed by myself:   EKG Interpretation  Date/Time:    Ventricular Rate:    PR Interval:    QRS Duration:   QT Interval:    QTC Calculation:   R Axis:     Text Interpretation:        Laboratory Studies: Results for  orders placed or performed during the hospital encounter of 07/18/16 (from the past 24 hour(s))  CBC with Differential     Status: Abnormal   Collection Time: 07/18/16  3:45 AM  Result Value Ref Range   WBC 11.1 (H) 4.0 - 10.5 K/uL   RBC 4.36 3.87 - 5.11 MIL/uL   Hemoglobin 12.6 12.0 - 15.0 g/dL   HCT 45.4 09.8 - 11.9 %   MCV 89.2 78.0 - 100.0 fL   MCH 28.9 26.0 - 34.0 pg   MCHC 32.4 30.0 - 36.0 g/dL   RDW 14.7 82.9 - 56.2 %   Platelets 238 150 - 400 K/uL   Neutrophils Relative % 63 %   Neutro Abs 7.0 1.7 - 7.7 K/uL   Lymphocytes Relative 29 %   Lymphs Abs 3.2 0.7 - 4.0 K/uL   Monocytes Relative 7 %   Monocytes Absolute 0.7 0.1 - 1.0 K/uL   Eosinophils Relative 1 %   Eosinophils Absolute 0.1 0.0 - 0.7 K/uL   Basophils Relative 0 %   Basophils Absolute 0.0 0.0 - 0.1 K/uL  Comprehensive metabolic panel     Status: Abnormal   Collection Time: 07/18/16  3:45 AM  Result Value Ref Range   Sodium 144 135 - 145 mmol/L   Potassium 3.5 3.5 - 5.1 mmol/L   Chloride 109 101 - 111 mmol/L   CO2 26 22 - 32 mmol/L   Glucose, Bld 108 (H) 65 - 99 mg/dL   BUN 25 (H) 6 - 20 mg/dL   Creatinine, Ser 1.30 (H) 0.44 - 1.00 mg/dL   Calcium 8.7 (L) 8.9 - 10.3 mg/dL   Total Protein 7.1 6.5 - 8.1 g/dL   Albumin 3.9 3.5 - 5.0 g/dL   AST 22 15 - 41 U/L   ALT 13 (L) 14 - 54 U/L   Alkaline Phosphatase 101 38 - 126 U/L   Total Bilirubin 0.5 0.3 - 1.2 mg/dL   GFR calc non Af Amer 54 (L) >60 mL/min   GFR calc Af Amer >60 >60 mL/min   Anion gap 9 5 - 15  Rapid urine drug screen (hospital performed)     Status: Abnormal   Collection Time: 07/18/16  4:14 AM  Result Value Ref Range   Opiates POSITIVE (A) NONE DETECTED   Cocaine NONE DETECTED NONE DETECTED   Benzodiazepines POSITIVE (A) NONE DETECTED   Amphetamines NONE DETECTED NONE DETECTED   Tetrahydrocannabinol NONE DETECTED NONE DETECTED   Barbiturates NONE DETECTED NONE DETECTED  Urinalysis, Routine w reflex microscopic     Status: Abnormal    Collection Time: 07/18/16  4:14 AM  Result Value Ref Range   Color, Urine YELLOW YELLOW   APPearance CLEAR CLEAR   Specific Gravity, Urine 1.018 1.005 - 1.030   pH 6.0 5.0 - 8.0   Glucose, UA NEGATIVE NEGATIVE mg/dL   Hgb urine dipstick SMALL (A) NEGATIVE   Bilirubin Urine NEGATIVE NEGATIVE  Ketones, ur NEGATIVE NEGATIVE mg/dL   Protein, ur NEGATIVE NEGATIVE mg/dL   Nitrite NEGATIVE NEGATIVE   Leukocytes, UA NEGATIVE NEGATIVE   RBC / HPF 6-30 0 - 5 RBC/hpf   WBC, UA 0-5 0 - 5 WBC/hpf   Bacteria, UA NONE SEEN NONE SEEN   Squamous Epithelial / LPF 0-5 (A) NONE SEEN   Mucous PRESENT    Amorphous Crystal PRESENT   Blood gas, arterial ([facility] ONLY)     Status: None   Collection Time: 07/18/16  6:25 AM  Result Value Ref Range   O2 Content ROOM AIR L/min   Delivery systems ROOM AIR    pH, Arterial 7.420 7.350 - 7.450   pCO2 arterial 39.3 32.0 - 48.0 mmHg   pO2, Arterial 89.6 83.0 - 108.0 mmHg   Bicarbonate 25.0 20.0 - 28.0 mmol/L   Acid-Base Excess 1.1 0.0 - 2.0 mmol/L   O2 Saturation 96.6 %   Patient temperature 98.4    Collection site RIGHT BRACHIAL    Drawn by [id number]    Sample type ARTERIAL    Allens test (pass/fail) PASS PASS   Imaging Studies: No results found.  ED COURSE  Nursing notes and initial vitals signs, including pulse oximetry, reviewed.  Vitals:   07/18/16 0243 07/18/16 0316 07/18/16 0400 07/18/16 0500  BP: (!) 159/99  (!) 147/84 (!) 147/87  Pulse: 94  85 88  Resp: (!) Temp: 98.6 F (37 C)     TempSrc: Rectal     SpO2: 99% 95% 92% 92%   5:36 AM Patient somnolent at this time. Noted to move all extremities equally. Pupils equal, round and reactive to light.  6:53 AM Patient is again confused and agitated. I suspect she may be suffering from narcotic withdrawal as she is on chronic opioids and may have completed her month's prescription inappropriately early.  PROCEDURES    ED DIAGNOSES     ICD-9-CM ICD-10-CM   1. Confusion 298.9  R41.0   2. Agitation 307.9 R45.1        Paula Libra, MD 07/18/16 [phone]

## 2016-07-18 NOTE — Progress Notes (Signed)
Pt refused EEG. RN aware. 

## 2016-07-18 NOTE — H&P (Signed)
History and Physical    SAFARI CINQUE XBJ:[id number] DOB: March 31, 1956 DOA: 07/18/2016  PCP: Karle Plumber, MD  Patient coming from: Home.  Chief Complaint: Agitation and confusion.  History obtained from patient's husband.  HPI: Heidi Green is a 61 y.o. female with history of hypertension chronic pain was brought to the ER patient became agitated and confused last night. Patient's husband states that patient had a similar episode last month which resolved without any intervention within a day. Last night patient husband went to bed and at that time patient was doing fine. After 1 AM patient's husband woke up and found that patient was confused and agitated and crying. EMS was called and since patient was agitated Versed was given.   ED Course: In the ER patient had to be given Ativan since patient was agitated. Labs did not show anything acute. Patient was afebrile. Not sure patient had missed her medications. CT head is bent and buried results are pending. ABG does not show any carbon dioxide retention. Patient is being admitted for encephalopathy/delirium/agitation.  Review of Systems: As per HPI, rest all negative.   Past Medical History:  Diagnosis Date  . Arthritis    in back  . Back pain   . Chest pain 06/16/2012   negative cath with normal LV function  . Headache(784.0)   . Hypertension   . Kidney stones     Past Surgical History:  Procedure Laterality Date  . ABDOMINAL HYSTERECTOMY    . BACK SURGERY    . LEFT HEART CATHETERIZATION WITH CORONARY ANGIOGRAM N/A 06/16/2012   Procedure: LEFT HEART CATHETERIZATION WITH CORONARY ANGIOGRAM;  Surgeon: Peter M Swaz[place], MD;  Location: [facility] CATH LAB;  Service: Cardiovascular;  Laterality: N/A;  . LITHOTRIPSY    . TUBAL LIGATION       reports that she quit smoking about 4 years ago. Her smoking use included Cigarettes. She has a 13.00 pack-year smoking history. She has never used smokeless tobacco. She reports that she does not drink  alcohol or use drugs.  Allergies  Allergen Reactions  . Garamycin [Gentamicin Sulfate] Hives and Shortness Of Breath  . Penicillins Anaphylaxis, Itching and Swelling  . Norvasc [Amlodipine Besylate] Nausea And Vomiting    Family History  Problem Relation Age of Onset  . Emphysema Mother   . Diabetes Mother     Prior to Admission medications   Medication Sig Start Date End Date Taking? Authorizing Provider  aspirin 81 MG tablet Take 1 tablet (81 mg total) by mouth daily. 06/17/12   Clydia Llano, MD  lisinopril (PRINIVIL,ZESTRIL) 5 MG tablet TAKE 1 TABLET BY MOUTH EVERY DAY *REPLACES NORVASC*    Peter M [place], MD  morphine (MS CONTIN) 30 MG 12 hr tablet Take 30 mg by mouth as directed.  10/22/12   Historical Provider, MD  oxycodone (ROXICODONE) 30 MG immediate release tablet Take 30 mg by mouth as directed.  10/22/12   Historical Provider, MD  rOPINIRole (REQUIP) 2 MG tablet Take 2 mg by mouth 2 (two) times daily.    Historical Provider, MD    Physical Exam: Vitals:   07/18/16 0243 07/18/16 0316 07/18/16 0400 07/18/16 0500  BP: (!) 159/99  (!) 147/84 (!) 147/87  Pulse: 94  85 88  Resp: (!) Temp: 98.6 F (37 C)     TempSrc: Rectal     SpO2: 99% 95% 92% 92%      Constitutional: Moderately built and nourished. Vitals:  07/18/16 0243 07/18/16 0316 07/18/16 0400 07/18/16 0500  BP: (!) 159/99  (!) 147/84 (!) 147/87  Pulse: 94  85 88  Resp: (!) Temp: 98.6 F (37 C)     TempSrc: Rectal     SpO2: 99% 95% 92% 92%   Eyes: Anicteric. No pallor. ENMT: No discharge from the ears eyes nose. Neck: No mass felt. No neck rigidity. Respiratory: No rhonchi or crepitations. Cardiovascular: S1-S2 heard no murmurs appreciated. Abdomen: Soft nontender bowel sounds present. No guarding or rigidity. Musculoskeletal: No edema. No joint effusion. Skin: No rash or skin appears warm. Neurologic: Alert awake oriented to name and place. Moves all extremities follows  commands. Psychiatric: Agitated.   Labs on Admission: I have personally reviewed following labs and imaging studies  CBC:  Recent Labs Lab 07/18/16 0345  WBC 11.1*  NEUTROABS 7.0  HGB 12.6  HCT 38.9  MCV 89.2  PLT 238   Basic Metabolic Panel:  Recent Labs Lab 07/18/16 0345  NA 144  K 3.5  CL 109  CO2 26  GLUCOSE 108*  BUN 25*  CREATININE 1.09*  CALCIUM 8.7*   GFR: CrCl cannot be calculated (Unknown ideal weight.). Liver Function Tests:  Recent Labs Lab 07/18/16 0345  AST 22  ALT 13*  ALKPHOS 101  BILITOT 0.5  PROT 7.1  ALBUMIN 3.9   No results for input(s): LIPASE, AMYLASE in the last 168 hours. No results for input(s): AMMONIA in the last 168 hours. Coagulation Profile: No results for input(s): INR, PROTIME in the last 168 hours. Cardiac Enzymes: No results for input(s): CKTOTAL, CKMB, CKMBINDEX, TROPONINI in the last 168 hours. BNP (last 3 results) No results for input(s): PROBNP in the last 8760 hours. HbA1C: No results for input(s): HGBA1C in the last 72 hours. CBG: No results for input(s): GLUCAP in the last 168 hours. Lipid Profile: No results for input(s): CHOL, HDL, LDLCALC, TRIG, CHOLHDL, LDLDIRECT in the last 72 hours. Thyroid Function Tests: No results for input(s): TSH, T4TOTAL, FREET4, T3FREE, THYROIDAB in the last 72 hours. Anemia Panel: No results for input(s): VITAMINB12, FOLATE, FERRITIN, TIBC, IRON, RETICCTPCT in the last 72 hours. Urine analysis:    Component Value Date/Time   COLORURINE YELLOW 07/18/2016 0414   APPEARANCEUR CLEAR 07/18/2016 0414   LABSPEC 1.018 07/18/2016 0414   PHURINE 6.0 07/18/2016 0414   GLUCOSEU NEGATIVE 07/18/2016 0414   HGBUR SMALL (A) 07/18/2016 0414   BILIRUBINUR NEGATIVE 07/18/2016 0414   KETONESUR NEGATIVE 07/18/2016 0414   PROTEINUR NEGATIVE 07/18/2016 0414   UROBILINOGEN 1.0 02/28/2008 1445   NITRITE NEGATIVE 07/18/2016 0414   LEUKOCYTESUR NEGATIVE 07/18/2016 0414   Sepsis  Labs: (procalcitonin:4,lacticidven:4) )No results found for this or any previous visit (from the past 240 hour(s)).   Radiological Exams on Admission: No results found.   Assessment/Plan Principal Problem:   Acute encephalopathy Active Problems:   HTN (hypertension)   Low back pain    1. Acute encephalopathy/delirium - cause not clear. Differentials include medication withdrawal. During the screen shows benzos and opiates. Patient was given Versed by the EMS and Ativan at the ER by the ER physician. I have ordered 1 dose of morphine 2 mg IV since patient is complaining of pain and agitation. Based on the response we will have further plans. Patient will be admitted to stepdown unit. May need Precedex if symptoms worsen. CT head is pending. I have also ordered ammonia levels. 2. Hypertension - we'll keep patient on when necessary IV hydralazine  for systolic blood pressure more than 160 until patient can reliably take orals. Since patient is kept nothing by mouth for now. 3. Chronic low back pain - once patient is stable may have to miss the back to see there is any acute cause for her back pain.  I will be discussing with oncoming hospitalist about the plan. Patient may need further doses of morphine or Ativan based on the response.   DVT prophylaxis: SCDs. Code Status: Full code.  Family Communication: Discussed with patient's husband.  Disposition Plan: Home.  Consults called: None.  Admission status: Observation.    Eduard Clos MD [facility] Pager [phone].  If 7PM-7AM, please contact night-coverage [email or web address] Password [email or web address]  07/18/2016, 7:06 AM

## 2016-07-18 NOTE — ED Notes (Signed)
Bed: WG95 Expected date:  Expected time:  Means of arrival:  Comments: EMS 64 aggressive/combative/altered mental status-CBG 121

## 2016-07-18 NOTE — Progress Notes (Signed)
Patient seen and examined. Admitted after midnight secondary to worsening lower back and acute episode of delirium/confusion. Patient changes in mental status appears to be associated with medications (most likely withdrawal). No signs of acute infection, essentially normal blood work and no major acute abnormalities on CT head (even test was limited due to markedly motion). Hemodynamically stable and calmer now after IV morphine and ativan given. Please refer to H&P written by [name] for further info/details on admission.  Plan: -will continue close monitoring -continue PRN morphine and if needed PRN ativan -once more awake and safe to eat will advance diet and maybe start low dose seroquel if required for agitation -will check B12, TSH and RPR  Heidi Green [phone]

## 2016-07-18 NOTE — ED Notes (Addendum)
Patient's contact is her husband Avnoor Koury at [phone]. Please call for any change or when patient is admitted.

## 2016-07-18 NOTE — Care Management Note (Signed)
Case Management Note  Patient Details  Name: Heidi Green MRN: [id number] Date of Birth: July 16, 1955  Subjective/Objective:       AMS due to polypharmacy and positive drug screen/aki             Action/Plan:Date:  July 18, 2016 Chart reviewed for concurrent status and case management needs. Will continue to follow patient progress. Discharge Planning: following for needs Expected discharge date: [id number] Marcelle Smiling, BSN, [place], [place]   [phone]  Expected Discharge Date:   (unknown)               Expected Discharge Plan:  Home/Self Care  In-House Referral:  Clinical Social Work  Discharge planning Services  CM Consult  Post Acute Care Choice:    Choice offered to:     DME Arranged:    DME Agency:     HH Arranged:    HH Agency:     Status of Service:  In process, will continue to follow  If discussed at Long Length of Stay Meetings, dates discussed:    Additional Comments:  Golda Acre, RN 07/18/2016, 10:35 AM

## 2016-07-19 ENCOUNTER — Observation Stay (HOSPITAL_COMMUNITY): Payer: Medicare HMO

## 2016-07-19 DIAGNOSIS — I10 Essential (primary) hypertension: Secondary | ICD-10-CM

## 2016-07-19 DIAGNOSIS — M545 Low back pain: Secondary | ICD-10-CM | POA: Diagnosis not present

## 2016-07-19 DIAGNOSIS — G8929 Other chronic pain: Secondary | ICD-10-CM | POA: Diagnosis not present

## 2016-07-19 DIAGNOSIS — G9340 Encephalopathy, unspecified: Secondary | ICD-10-CM | POA: Diagnosis not present

## 2016-07-19 DIAGNOSIS — E669 Obesity, unspecified: Secondary | ICD-10-CM | POA: Diagnosis not present

## 2016-07-19 LAB — GLUCOSE, CAPILLARY: GLUCOSE-CAPILLARY: 99 mg/dL (ref 65–99)

## 2016-07-19 LAB — CBC
HCT: 39 % (ref 36.0–46.0)
Hemoglobin: 12.7 g/dL (ref 12.0–15.0)
MCH: 29 pg (ref 26.0–34.0)
MCHC: 32.6 g/dL (ref 30.0–36.0)
MCV: 89 fL (ref 78.0–100.0)
Platelets: 230 10*3/uL (ref 150–400)
RBC: 4.38 MIL/uL (ref 3.87–5.11)
RDW: 14.7 % (ref 11.5–15.5)
WBC: 10.7 10*3/uL — ABNORMAL HIGH (ref 4.0–10.5)

## 2016-07-19 LAB — BASIC METABOLIC PANEL
ANION GAP: 7 (ref 5–15)
BUN: 17 mg/dL (ref 6–20)
CALCIUM: 8.7 mg/dL — AB (ref 8.9–10.3)
CO2: 24 mmol/L (ref 22–32)
Chloride: 108 mmol/L (ref 101–111)
Creatinine, Ser: 0.71 mg/dL (ref 0.44–1.00)
GFR calc Af Amer: >60 mL/min (ref >60)
GLUCOSE: 103 mg/dL — AB (ref 65–99)
Potassium: 3.9 mmol/L (ref 3.5–5.1)
SODIUM: 139 mmol/L (ref 135–145)

## 2016-07-19 LAB — HIV ANTIBODY (ROUTINE TESTING W REFLEX): HIV Screen 4th Generation wRfx: NONREACTIVE

## 2016-07-19 LAB — RPR: RPR: NONREACTIVE

## 2016-07-19 MED ORDER — ALUM & MAG HYDROXIDE-SIMETH 200-200-20 MG/5ML PO SUSP
30.0000 mL | ORAL | Status: DC | PRN
  Administered 2016-07-19: 30 mL via ORAL
  Filled 2016-07-19: qty 30

## 2016-07-19 MED ORDER — HYDROCODONE-ACETAMINOPHEN 5-325 MG PO TABS
1.0000 | ORAL_TABLET | ORAL | Status: AC | PRN
  Administered 2016-07-19 (×2): 2 via ORAL
  Filled 2016-07-19 (×2): qty 2

## 2016-07-19 NOTE — Progress Notes (Signed)
Pt discharged without incident. Taken to car and released to husband.

## 2016-07-19 NOTE — Progress Notes (Signed)
Upon assessment, patient reported and it was observed a puncture wound from glass, located on patient's right heel. The wound is red and tender to the touch. Pt reports that glass is still in the wound, and wound feels to have glass in it upon palpation. Patient requests physician examine the wound and remove the glass.  --Mendel Ryder, Student Nurse

## 2016-07-19 NOTE — Discharge Summary (Signed)
Physician Discharge Summary  Heidi Green:[id number] DOB: [birth year] DOA: 07/18/2016  PCP: Karle Plumber, MD  Admit date: 07/18/2016 Discharge date: 07/19/2016  Time spent: 35 minutes  Recommendations for Outpatient Follow-up:  1. Repeat BMET to follow electrolytes and renal function  2. Reassess BP and adjust antihypertensive regimen as needed  3. Performed abd [place] in 5 years for follow up on risk to develop AAA aneurysm 4. Patient to follow up for further work up and definitive treatment for her low back pain.   Discharge Diagnoses:  Principal Problem:   Acute encephalopathy Active Problems:   HTN (hypertension)   Back pain   Class 2 obesity   Discharge Condition: stable and oriented X3; mentation back to baseline. Patient discharge home with instructions to follow up with PCP and to pursuit further workup/treatment for lower back (she is already orchestrating follow up with her decided provider).  Diet recommendation: low calorie and heart healthy diet   Filed Weights   07/18/16 0900  Weight: 103.5 kg (228 lb 2.8 oz)    History of present illness:  As per H&P written by Dr. Toniann Green on 07/18/16 61 y.o. female with history of hypertension chronic pain was brought to the ER patient became agitated and confused last night. Patient's husband states that patient had a similar episode last month which resolved without any intervention within a day. Last night patient husband went to bed and at that time patient was doing fine. After 1 AM patient's husband woke up and found that patient was confused and agitated and crying. EMS was called and since patient was agitated Versed was given.   In the ER patient had to be given Ativan since patient was agitated. Labs did not show anything acute. Patient was afebrile. Not sure patient had missed her medications. CT head is bent and buried results are pending. ABG does not show any carbon dioxide retention. Patient is being admitted for  encephalopathy/delirium/agitation.  Hospital Course:  1-acute encephalopathy: appears to be associated with medications; also pain can contribute to altered mentation. Per patient she has had difficulty sleeping and got confused because of that. -after receiving IV pain meds and ativan for agitation; she was able to rest and her pain even still present improved. Patient mentation is back to normal and she is oriented X 3 -no signs of infection seen and no acute intracranial abnormalities seen on head images. -discharge home with instructions to follow up with PCP in 10 days. -TSH, B12 and Ammonia WNL -HIV and RPR non reactive  2-chronic low back pain -with significant narrowing and concerns for impingement  Around L3-L4 -will need outpatient follow up with orthopedic service and/or spine surgery -continue pain meds for now  3-HTN -will continue antihypertensive regimen and encouraged to follow low sodium diet   4-obesity -Body mass index is 36.83 kg/m. -low calorie diet advised  5-Aortic atherosclerosis with Ectatic abdominal aorta -at risk for aneurysm -per radiology recommendations will benefit of abd [place] in 5 years for follow up.  6-right foot heel pain: per patient had injury with temper glass over a year ago -no foreign body, granulomatous formation seen or palpated -cracked on her sole appreciated from self inflicted injury trying to take glass out. -no surrounding erythema or signs of infection.  Procedures: See below for x-ray reports   Consultations:  None   Discharge Exam: Vitals:   07/19/16 0800 07/19/16 0803  BP: (!) 166/78 (!) 154/93  Pulse: 75 76  Resp: 12 14  Temp:      General: afebrile, oriented X3 and per husband at bedside back to baseline. No CP or SOB. Still having back pain and also complaining of pain in his right heel. Cardiovascular: S1 and S2, no rubs, no gallops Respiratory: CTA bilaterally abd: soft, NT, ND, positive BS Extremities: no  cyanosis or clubbing; right foot with cracked on her heel; no foreign body seen or palpated on exam.  Discharge Instructions   Discharge Instructions    Diet - low sodium heart healthy    Complete by:  As directed    Discharge instructions    Complete by:  As directed    Please follow up with PCP in 10 days Pursuit orthopedic or spine surgery evaluation for your ongoing back pain. Take medications as prescribed Follow heart healthy diet  Maintain adequate hydration.     Current Discharge Medication List    CONTINUE these medications which have NOT CHANGED   Details  baclofen (LIORESAL) 10 MG tablet Take 10 mg by mouth 2 (two) times daily.     cyanocobalamin (,VITAMIN B-12,) 1000 MCG/ML injection See admin instructions. Inject 1 ml in the muscle weekly for 4 weeks, then 1 ml monthly thereafter.    fluticasone (FLONASE) 50 MCG/ACT nasal spray Place into both nostrils daily. As directed.    furosemide (LASIX) 40 MG tablet Take 40 mg by mouth daily.    HYDROcodone-acetaminophen (NORCO/VICODIN) 5-325 MG tablet Take 1 tablet by mouth 3 (three) times daily.    lisinopril (PRINIVIL,ZESTRIL) 2.5 MG tablet Take 2.5 mg by mouth daily.    Loratadine (CLARITIN PO) Take 1 tablet by mouth daily.    NAPROXEN DR 500 MG EC tablet Take 500 mg by mouth 2 (two) times daily.    rOPINIRole (REQUIP) 2 MG tablet Take 2 mg by mouth 2 (two) times daily.       Allergies  Allergen Reactions  . Garamycin [Gentamicin Sulfate] Hives and Shortness Of Breath  . Penicillins Anaphylaxis, Itching and Swelling    Has patient had a PCN reaction causing immediate rash, facial/tongue/throat swelling, SOB or lightheadedness with hypotension: Yes Has patient had a PCN reaction causing severe rash involving mucus membranes or skin necrosis: No Has patient had a PCN reaction that required hospitalization No Has patient had a PCN reaction occurring within the last 10 years: No If all of the above answers are  "NO", then may proceed with Cephalosporin use.   Marland Kitchen Norvasc [Amlodipine Besylate] Nausea And Vomiting   Follow-up Information    Karle Plumber, MD. Schedule an appointment as soon as possible for a visit in 10 day(s).   Specialty:  Internal Medicine Contact information: [phone] [place] [address] [phone]           The results of significant diagnostics from this hospitalization (including imaging, microbiology, ancillary and laboratory) are listed below for reference.    Significant Diagnostic Studies: Ct Head Wo Contrast  Result Date: 07/18/2016 CLINICAL DATA:  Altered mental status.  Disoriented.  Combative. EXAM: CT HEAD WITHOUT CONTRAST TECHNIQUE: Contiguous axial images were obtained from the base of the skull through the vertex without intravenous contrast. COMPARISON:  None. FINDINGS: Brain: Motion degraded examination. Allowing for that, the brain appears normal without evidence of atrophy, mass, hemorrhage, hydrocephalus or extra-axial collection. One could question some chronic small-vessel change of the white matter. Sensitivity is limited. Vascular: There is atherosclerotic calcification of the major vessels at the base of the brain. Skull: Negative Sinuses/Orbits: Clear/normal Other:  None significant IMPRESSION: Markedly motion degraded exam. No abnormality visible by CT. This rules out any large intracranial hemorrhage, hydrocephalus or mass effect/shift. There may be some chronic small-vessel changes of the white matter, but the examination is quite limited. Electronically Signed   By: Paulina Fusi M.D.   On: 07/18/2016 07:11   Ct Lumbar Spine W Contrast  Result Date: 07/19/2016 CLINICAL DATA:  Back pain.  History of surgery.  Encephalopathy. EXAM: CT LUMBAR SPINE WITH CONTRAST TECHNIQUE: Multidetector CT imaging of the lumbar spine was performed with intravenous contrast administration. CONTRAST:  100 mL Isovue-300. COMPARISON:  Plain films 03/10/2016.  MRI  lumbar spine 11/24/2003. FINDINGS: Segmentation: Standard Alignment: Degenerative scoliosis convex LEFT approximately 15 degrees centered at L3-L4. Vertebrae: There is arthrodesis across the L4-5 and L5-S1 interspaces. This may be postsurgical. No abnormal enhancement is seen within the spinal canal or paravertebral soft tissues. 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 Vascular calcification. Splenic artery aneurysm. Aortic atherosclerosis with cross-sectional measurements of the infrarenal abdominal aorta 25 x 23 mm opposite L4. No hydronephrosis. Disc levels: L1-L2:  Unremarkable. L2-L3:  Unremarkable disc space.  Facet arthropathy. L3-L4: Severe disc space narrowing on the RIGHT with large extraforaminal extrusion accompanied by osseous spurring. Facet arthropathy. LEFT laminotomy. BILATERAL L3 and L4 nerve root impingement are likely, worse on the RIGHT. L4-L5: Interbody arthrodesis. Posterior arthrodesis across the facets. Mild stenosis. No definite subarticular zone narrowing. RIGHT foraminal narrowing could affect the L4 nerve root. L5-S1: Solid interbody and posterior arthrodesis. RIGHT laminotomy. Bony overgrowth contributes to foraminal narrowing bilaterally which could affect either L5 nerve root. Standard IMPRESSION: Solid appearing L4-5 and L5-S1 fusion. Bony foraminal narrowing at these levels could affect the exiting L4 and L5 nerve roots. 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 Severe disc space narrowing, osseous spurring, and extraforaminal protrusion on the RIGHT. RIGHT greater than LEFT L3 and L4 nerve root impingement. Aortic atherosclerosis. Ectatic abdominal aorta at risk for aneurysm development. Recommend followup by ultrasound in 5 years. This recommendation follows ACR consensus guidelines: White Paper of the ACR Incidental Findings Committee II on Vascular Findings. J Am Coll Radiol 2013; 10:789-794. Electronically Signed   By: Elsie Stain M.D.   On:  07/19/2016 07:50    Microbiology: Recent Results (from the past 240 hour(s))  MRSA PCR Screening     Status: None   Collection Time: 07/18/16  8:32 AM  Result Value Ref Range Status   MRSA by PCR NEGATIVE NEGATIVE Final    Comment:        The GeneXpert MRSA Assay (FDA approved for NASAL specimens only), is one component of a comprehensive MRSA colonization surveillance program. It is not intended to diagnose MRSA infection nor to guide or monitor treatment for MRSA infections.      Labs: Basic Metabolic Panel:  Recent Labs Lab 07/18/16 0345 07/18/16 1000 07/19/16 0346  NA 144 144 139  K 3.5 3.5 3.9  CL 109 109 108  CO2 GLUCOSE 108* 92 103*  BUN 25* 19 17  CREATININE 1.09* 0.83 0.71  CALCIUM 8.7* 9.1 8.7*   Liver Function Tests:  Recent Labs Lab 07/18/16 0345 07/18/16 1000  AST 22 23  ALT 13* 13*  ALKPHOS 101 93  BILITOT 0.5 0.8  PROT 7.1 7.8  ALBUMIN 3.9 4.3    Recent Labs Lab 07/18/16 1000  AMMONIA 34   CBC:  Recent Labs Lab 07/18/16 0345 07/18/16 1000 07/19/16 0346  WBC 11.1* 13.3* 10.7*  NEUTROABS 7.0 8.5*  --   HGB 12.6 14.1 12.7  HCT 38.9 43.0 39.0  MCV 89.2 89.8 89.0  PLT 238 243 230    CBG:  Recent Labs Lab 07/18/16 1133 07/18/16 1730 07/18/16 2327  GLUCAP 76 125* 101*    Signed:  Vassie Loll MD.  [facility] 07/19/2016, 10:36 AM

## 2016-07-19 NOTE — Discharge Instructions (Signed)
Back Exercises The following exercises strengthen the muscles that help to support the back. They also help to keep the lower back flexible. Doing these exercises can help to prevent back pain or lessen existing pain. If you have back pain or discomfort, try doing these exercises 2-3 times each day or as told by your health care provider. When the pain goes away, do them once each day, but increase the number of times that you repeat the steps for each exercise (do more repetitions). If you do not have back pain or discomfort, do these exercises once each day or as told by your health care provider. Exercises Single Knee to Chest   Repeat these steps 3-5 times for each leg: 1. Lie on your back on a firm bed or the floor with your legs extended. 2. Bring one knee to your chest. Your other leg should stay extended and in contact with the floor. 3. Hold your knee in place by grabbing your knee or thigh. 4. Pull on your knee until you feel a gentle stretch in your lower back. 5. Hold the stretch for 10-30 seconds. 6. Slowly release and straighten your leg. Pelvic Tilt   Repeat these steps 5-10 times: 1. Lie on your back on a firm bed or the floor with your legs extended. 2. Bend your knees so they are pointing toward the ceiling and your feet are flat on the floor. 3. Tighten your lower abdominal muscles to press your lower back against the floor. This motion will tilt your pelvis so your tailbone points up toward the ceiling instead of pointing to your feet or the floor. 4. With gentle tension and even breathing, hold this position for 5-10 seconds. Cat-Cow   Repeat these steps until your lower back becomes more flexible: 1. Get into a hands-and-knees position on a firm surface. Keep your hands under your shoulders, and keep your knees under your hips. You may place padding under your knees for comfort. 2. Let your head hang down, and point your tailbone toward the floor so your lower back  becomes rounded like the back of a cat. 3. Hold this position for 5 seconds. 4. Slowly lift your head and point your tailbone up toward the ceiling so your back forms a sagging arch like the back of a cow. 5. Hold this position for 5 seconds. Press-Ups   Repeat these steps 5-10 times: 1. Lie on your abdomen (face-down) on the floor. 2. Place your palms near your head, about shoulder-width apart. 3. While you keep your back as relaxed as possible and keep your hips on the floor, slowly straighten your arms to raise the top half of your body and lift your shoulders. Do not use your back muscles to raise your upper torso. You may adjust the placement of your hands to make yourself more comfortable. 4. Hold this position for 5 seconds while you keep your back relaxed. 5. Slowly return to lying flat on the floor. Bridges   Repeat these steps 10 times: 1. Lie on your back on a firm surface. 2. Bend your knees so they are pointing toward the ceiling and your feet are flat on the floor. 3. Tighten your buttocks muscles and lift your buttocks off of the floor until your waist is at almost the same height as your knees. You should feel the muscles working in your buttocks and the back of your thighs. If you do not feel these muscles, slide your feet 1-2 inches farther  away from your buttocks. 4. Hold this position for 3-5 seconds. 5. Slowly lower your hips to the starting position, and allow your buttocks muscles to relax completely. If this exercise is too easy, try doing it with your arms crossed over your chest. Abdominal Crunches   Repeat these steps 5-10 times: 1. Lie on your back on a firm bed or the floor with your legs extended. 2. Bend your knees so they are pointing toward the ceiling and your feet are flat on the floor. 3. Cross your arms over your chest. 4. Tip your chin slightly toward your chest without bending your neck. 5. Tighten your abdominal muscles and slowly raise your trunk  (torso) high enough to lift your shoulder blades a tiny bit off of the floor. Avoid raising your torso higher than that, because it can put too much stress on your low back and it does not help to strengthen your abdominal muscles. 6. Slowly return to your starting position. Back Lifts  Repeat these steps 5-10 times: 1. Lie on your abdomen (face-down) with your arms at your sides, and rest your forehead on the floor. 2. Tighten the muscles in your legs and your buttocks. 3. Slowly lift your chest off of the floor while you keep your hips pressed to the floor. Keep the back of your head in line with the curve in your back. Your eyes should be looking at the floor. 4. Hold this position for 3-5 seconds. 5. Slowly return to your starting position. Contact a health care provider if:  Your back pain or discomfort gets much worse when you do an exercise.  Your back pain or discomfort does not lessen within 2 hours after you exercise. If you have any of these problems, stop doing these exercises right away. Do not do them again unless your health care provider says that you can. Get help right away if:  You develop sudden, severe back pain. If this happens, stop doing the exercises right away. Do not do them again unless your health care provider says that you can. This information is not intended to replace advice given to you by your health care provider. Make sure you discuss any questions you have with your health care provider. Document Released: 05/09/2004 Document Revised: 08/09/2015 Document Reviewed: 05/26/2014 Elsevier Interactive Patient Education  2017 [place] Injury Prevention Back injuries can be very painful. They can also be difficult to heal. After having one back injury, you are more likely to injure your back again. It is important to learn how to avoid injuring or re-injuring your back. The following tips can help you to prevent a back injury. What should I know  about physical fitness?  Exercise for 30 minutes per day on most days of the week or as directed by your health care provider. Make sure to:  Do aerobic exercises, such as walking, jogging, biking, or swimming.  Do exercises that increase balance and strength, such as tai chi and yoga. These can decrease your risk of falling and injuring your back.  Do stretching exercises to help with flexibility.  Try to develop strong abdominal muscles. Your abdominal muscles provide a lot of the support that is needed by your back.  Maintain a healthy weight. This helps to decrease your risk of a back injury. What should I know about my diet?  Talk with your health care provider about your overall diet. Take supplements and vitamins only as directed by your health care  provider.  Talk with your health care provider about how much calcium and vitamin D you need each day. These nutrients help to prevent weakening of the bones (osteoporosis). Osteoporosis can cause broken (fractured) bones, which lead to back pain.  Include good sources of calcium in your diet, such as dairy products, green leafy vegetables, and products that have had calcium added to them (fortified).  Include good sources of vitamin D in your diet, such as milk and foods that are fortified with vitamin D. What should I know about my posture?  Sit up straight and stand up straight. Avoid leaning forward when you sit or hunching over when you stand.  Choose chairs that have good low-back (lumbar) support.  If you work at a desk, sit close to it so you do not need to lean over. Keep your chin tucked in. Keep your neck drawn back, and keep your elbows bent at a right angle. Your arms should look like the letter "L."  Sit high and close to the steering wheel when you drive. Add a lumbar support to your car seat, if needed.  Avoid sitting or standing in one position for very long. Take breaks to get up, stretch, and walk around at least  one time every hour. Take breaks every hour if you are driving for long periods of time.  Sleep on your side with your knees slightly bent, or sleep on your back with a pillow under your knees. Do not lie on the front of your body to sleep. What should I know about lifting, twisting, and reaching? Lifting and Heavy Lifting    Avoid heavy lifting, especially repetitive heavy lifting. If you must do heavy lifting:  Stretch before lifting.  Work slowly.  Rest between lifts.  Use a tool such as a cart or a dolly to move objects if one is available.  Make several small trips instead of carrying one heavy load.  Ask for help when you need it, especially when moving big objects.  Follow these steps when lifting:  Stand with your feet shoulder-width apart.  Get as close to the object as you can. Do not try to pick up a heavy object that is far from your body.  Use handles or lifting straps if they are available.  Bend at your knees. Squat down, but keep your heels off the floor.  Keep your shoulders pulled back, your chin tucked in, and your back straight.  Lift the object slowly while you tighten the muscles in your legs, abdomen, and buttocks. Keep the object as close to the center of your body as possible.  Follow these steps when putting down a heavy load:  Stand with your feet shoulder-width apart.  Lower the object slowly while you tighten the muscles in your legs, abdomen, and buttocks. Keep the object as close to the center of your body as possible.  Keep your shoulders pulled back, your chin tucked in, and your back straight.  Bend at your knees. Squat down, but keep your heels off the floor.  Use handles or lifting straps if they are available. Twisting and Reaching   Avoid lifting heavy objects above your waist.  Do not twist at your waist while you are lifting or carrying a load. If you need to turn, move your feet.  Do not bend over without bending at your  knees.  Avoid reaching over your head, across a table, or for an object on a high surface. What are some  other tips?  Avoid wet floors and icy ground. Keep sidewalks clear of ice to prevent falls.  Do not sleep on a mattress that is too soft or too hard.  Keep items that are used frequently within easy reach.  Put heavier objects on shelves at waist level, and put lighter objects on lower or higher shelves.  Find ways to decrease your stress, such as exercise, massage, or relaxation techniques. Stress can build up in your muscles. Tense muscles are more vulnerable to injury.  Talk with your health care provider if you feel anxious or depressed. These conditions can make back pain worse.  Wear flat heel shoes with cushioned soles.  Avoid sudden movements.  Use both shoulder straps when carrying a backpack.  Do not use any tobacco products, including cigarettes, chewing tobacco, or electronic cigarettes. If you need help quitting, ask your health care provider. This information is not intended to replace advice given to you by your health care provider. Make sure you discuss any questions you have with your health care provider. Document Released: 05/09/2004 Document Revised: 09/07/2015 Document Reviewed: 04/05/2014 Elsevier Interactive Patient Education  2017 [employer] Inc[employer]   Back Pain, Adult Back pain is very common. The pain often gets better over time. The cause of back pain is usually not dangerous. Most people can learn to manage their back pain on their own. Follow these instructions at home: Watch your back pain for any changes. The following actions may help to lessen any pain you are feeling:  Stay active. Start with short walks on flat ground if you can. Try to walk farther each day.  Exercise regularly as told by your doctor. Exercise helps your back heal faster. It also helps avoid future injury by keeping your muscles strong and flexible.  Do not sit, drive, or stand  in one place for more than 30 minutes.  Do not stay in bed. Resting more than 1-2 days can slow down your recovery.  Be careful when you bend or lift an object. Use good form when lifting:  Bend at your knees.  Keep the object close to your body.  Do not twist.  Sleep on a firm mattress. Lie on your side, and bend your knees. If you lie on your back, put a pillow under your knees.  Take medicines only as told by your doctor.  Put ice on the injured area.  Put ice in a plastic bag.  Place a towel between your skin and the bag.  Leave the ice on for 20 minutes, 2-3 times a day for the first 2-3 days. After that, you can switch between ice and heat packs.  Avoid feeling anxious or stressed. Find good ways to deal with stress, such as exercise.  Maintain a healthy weight. Extra weight puts stress on your back. Contact a doctor if:  You have pain that does not go away with rest or medicine.  You have worsening pain that goes down into your legs or buttocks.  You have pain that does not get better in one week.  You have pain at night.  You lose weight.  You have a fever or chills. Get help right away if:  You cannot control when you poop (bowel movement) or pee (urinate).  Your arms or legs feel weak.  Your arms or legs lose feeling (numbness).  You feel sick to your stomach (nauseous) or throw up (vomit).  You have belly (abdominal) pain.  You feel like you may pass  out (faint). This information is not intended to replace advice given to you by your health care provider. Make sure you discuss any questions you have with your health care provider. Document Released: 09/18/2007 Document Revised: 09/07/2015 Document Reviewed: 08/03/2013 Elsevier Interactive Patient Education  2017 [employer].

## 2016-07-19 NOTE — Progress Notes (Signed)
During assessment, pt stated that she fell a few months ago and has had left hip pain since that time. On assessment, a hard/nodule like area with slight discoloration was noted on the patient's left-mid buttock. Patient requested doctor to examine this area and her hip in the a.m., and would like an x-ray as she's concerned she has an ongoing injury from the fall.  --Mendel Ryder, Student Nurse

## 2016-07-24 ENCOUNTER — Inpatient Hospital Stay (HOSPITAL_COMMUNITY): Payer: Medicare HMO

## 2016-07-24 ENCOUNTER — Emergency Department (HOSPITAL_COMMUNITY): Payer: Medicare HMO

## 2016-07-24 ENCOUNTER — Inpatient Hospital Stay (HOSPITAL_COMMUNITY)
Admission: EM | Admit: 2016-07-24 | Discharge: 2016-08-01 | DRG: 064 | Disposition: A | Discharge status: Skilled Nursing Facility | Payer: Medicare HMO | Attending: Neurology | Admitting: Neurology

## 2016-07-24 DIAGNOSIS — D72829 Elevated white blood cell count, unspecified: Secondary | ICD-10-CM | POA: Diagnosis not present

## 2016-07-24 DIAGNOSIS — R4701 Aphasia: Secondary | ICD-10-CM | POA: Diagnosis present

## 2016-07-24 DIAGNOSIS — J309 Allergic rhinitis, unspecified: Secondary | ICD-10-CM | POA: Diagnosis present

## 2016-07-24 DIAGNOSIS — I6789 Other cerebrovascular disease: Secondary | ICD-10-CM | POA: Diagnosis not present

## 2016-07-24 DIAGNOSIS — I619 Nontraumatic intracerebral hemorrhage, unspecified: Secondary | ICD-10-CM

## 2016-07-24 DIAGNOSIS — I10 Essential (primary) hypertension: Secondary | ICD-10-CM | POA: Diagnosis present

## 2016-07-24 DIAGNOSIS — Z881 Allergy status to other antibiotic agents status: Secondary | ICD-10-CM

## 2016-07-24 DIAGNOSIS — Z87442 Personal history of urinary calculi: Secondary | ICD-10-CM

## 2016-07-24 DIAGNOSIS — M549 Dorsalgia, unspecified: Secondary | ICD-10-CM | POA: Diagnosis present

## 2016-07-24 DIAGNOSIS — R451 Restlessness and agitation: Secondary | ICD-10-CM

## 2016-07-24 DIAGNOSIS — I610 Nontraumatic intracerebral hemorrhage in hemisphere, subcortical: Principal | ICD-10-CM | POA: Diagnosis present

## 2016-07-24 DIAGNOSIS — Z880 Allergy status to penicillin: Secondary | ICD-10-CM

## 2016-07-24 DIAGNOSIS — R29810 Facial weakness: Secondary | ICD-10-CM | POA: Diagnosis present

## 2016-07-24 DIAGNOSIS — Z791 Long term (current) use of non-steroidal anti-inflammatories (NSAID): Secondary | ICD-10-CM

## 2016-07-24 DIAGNOSIS — R4182 Altered mental status, unspecified: Secondary | ICD-10-CM | POA: Diagnosis present

## 2016-07-24 DIAGNOSIS — I639 Cerebral infarction, unspecified: Secondary | ICD-10-CM | POA: Diagnosis present

## 2016-07-24 DIAGNOSIS — Z833 Family history of diabetes mellitus: Secondary | ICD-10-CM | POA: Diagnosis not present

## 2016-07-24 DIAGNOSIS — R1310 Dysphagia, unspecified: Secondary | ICD-10-CM | POA: Diagnosis present

## 2016-07-24 DIAGNOSIS — Z90710 Acquired absence of both cervix and uterus: Secondary | ICD-10-CM | POA: Diagnosis not present

## 2016-07-24 DIAGNOSIS — I161 Hypertensive emergency: Secondary | ICD-10-CM | POA: Diagnosis present

## 2016-07-24 DIAGNOSIS — M1990 Unspecified osteoarthritis, unspecified site: Secondary | ICD-10-CM | POA: Diagnosis present

## 2016-07-24 DIAGNOSIS — G9340 Encephalopathy, unspecified: Secondary | ICD-10-CM | POA: Diagnosis present

## 2016-07-24 DIAGNOSIS — Z825 Family history of asthma and other chronic lower respiratory diseases: Secondary | ICD-10-CM | POA: Diagnosis not present

## 2016-07-24 DIAGNOSIS — E876 Hypokalemia: Secondary | ICD-10-CM

## 2016-07-24 DIAGNOSIS — I69391 Dysphagia following cerebral infarction: Secondary | ICD-10-CM | POA: Diagnosis not present

## 2016-07-24 DIAGNOSIS — G8111 Spastic hemiplegia affecting right dominant side: Secondary | ICD-10-CM | POA: Diagnosis not present

## 2016-07-24 DIAGNOSIS — R1312 Dysphagia, oropharyngeal phase: Secondary | ICD-10-CM | POA: Diagnosis not present

## 2016-07-24 DIAGNOSIS — Z9851 Tubal ligation status: Secondary | ICD-10-CM | POA: Diagnosis not present

## 2016-07-24 DIAGNOSIS — Z6837 Body mass index (BMI) 37.0-37.9, adult: Secondary | ICD-10-CM

## 2016-07-24 DIAGNOSIS — Z7951 Long term (current) use of inhaled steroids: Secondary | ICD-10-CM

## 2016-07-24 DIAGNOSIS — I69320 Aphasia following cerebral infarction: Secondary | ICD-10-CM | POA: Diagnosis not present

## 2016-07-24 DIAGNOSIS — Z87891 Personal history of nicotine dependence: Secondary | ICD-10-CM

## 2016-07-24 DIAGNOSIS — E669 Obesity, unspecified: Secondary | ICD-10-CM | POA: Diagnosis present

## 2016-07-24 DIAGNOSIS — Z91199 Patient's noncompliance with other medical treatment and regimen due to unspecified reason: Secondary | ICD-10-CM

## 2016-07-24 DIAGNOSIS — Z9119 Patient's noncompliance with other medical treatment and regimen: Secondary | ICD-10-CM | POA: Diagnosis not present

## 2016-07-24 DIAGNOSIS — I69191 Dysphagia following nontraumatic intracerebral hemorrhage: Secondary | ICD-10-CM

## 2016-07-24 DIAGNOSIS — E785 Hyperlipidemia, unspecified: Secondary | ICD-10-CM | POA: Diagnosis present

## 2016-07-24 DIAGNOSIS — G8191 Hemiplegia, unspecified affecting right dominant side: Secondary | ICD-10-CM | POA: Diagnosis present

## 2016-07-24 DIAGNOSIS — Z888 Allergy status to other drugs, medicaments and biological substances status: Secondary | ICD-10-CM

## 2016-07-24 DIAGNOSIS — Z4659 Encounter for fitting and adjustment of other gastrointestinal appliance and device: Secondary | ICD-10-CM

## 2016-07-24 DIAGNOSIS — Z79899 Other long term (current) drug therapy: Secondary | ICD-10-CM

## 2016-07-24 HISTORY — DX: Nontraumatic intracerebral hemorrhage, unspecified: I61.9

## 2016-07-24 LAB — COMPREHENSIVE METABOLIC PANEL
ALT: 17 U/L (ref 14–54)
AST: 30 U/L (ref 15–41)
Albumin: 4.3 g/dL (ref 3.5–5.0)
Alkaline Phosphatase: 97 U/L (ref 38–126)
Anion gap: 10 (ref 5–15)
BUN: 11 mg/dL (ref 6–20)
CO2: 27 mmol/L (ref 22–32)
CREATININE: 0.75 mg/dL (ref 0.44–1.00)
Calcium: 9.3 mg/dL (ref 8.9–10.3)
Chloride: 105 mmol/L (ref 101–111)
Glucose, Bld: 108 mg/dL — ABNORMAL HIGH (ref 65–99)
POTASSIUM: 3.6 mmol/L (ref 3.5–5.1)
Sodium: 142 mmol/L (ref 135–145)
TOTAL PROTEIN: 7.3 g/dL (ref 6.5–8.1)
Total Bilirubin: 0.6 mg/dL (ref 0.3–1.2)

## 2016-07-24 LAB — CBC
HCT: 44.5 % (ref 36.0–46.0)
HEMOGLOBIN: 14.5 g/dL (ref 12.0–15.0)
MCH: 28.8 pg (ref 26.0–34.0)
MCHC: 32.6 g/dL (ref 30.0–36.0)
MCV: 88.3 fL (ref 78.0–100.0)
Platelets: 239 10*3/uL (ref 150–400)
RBC: 5.04 MIL/uL (ref 3.87–5.11)
RDW: 14.6 % (ref 11.5–15.5)
WBC: 16.2 10*3/uL — ABNORMAL HIGH (ref 4.0–10.5)

## 2016-07-24 LAB — DIFFERENTIAL
BASOS ABS: 0 10*3/uL (ref 0.0–0.1)
Basophils Relative: 0 %
EOS ABS: 0 10*3/uL (ref 0.0–0.7)
Eosinophils Relative: 0 %
LYMPHS ABS: 2.2 10*3/uL (ref 0.7–4.0)
Lymphocytes Relative: 14 %
MONO ABS: 0.6 10*3/uL (ref 0.1–1.0)
Monocytes Relative: 4 %
Neutro Abs: 13.4 10*3/uL — ABNORMAL HIGH (ref 1.7–7.7)
Neutrophils Relative %: 82 %

## 2016-07-24 LAB — CBG MONITORING, ED: Glucose-Capillary: 95 mg/dL (ref 65–99)

## 2016-07-24 LAB — PROTIME-INR
INR: 1.05
Prothrombin Time: 13.7 seconds (ref 11.4–15.2)

## 2016-07-24 LAB — I-STAT CHEM 8, ED
BUN: 13 mg/dL (ref 6–20)
CHLORIDE: 107 mmol/L (ref 101–111)
CREATININE: 0.6 mg/dL (ref 0.44–1.00)
Calcium, Ion: 1.01 mmol/L — ABNORMAL LOW (ref 1.15–1.40)
GLUCOSE: 107 mg/dL — AB (ref 65–99)
HCT: 41 % (ref 36.0–46.0)
Hemoglobin: 13.9 g/dL (ref 12.0–15.0)
POTASSIUM: 3.8 mmol/L (ref 3.5–5.1)
Sodium: 140 mmol/L (ref 135–145)
TCO2: 27 mmol/L (ref 0–100)

## 2016-07-24 LAB — I-STAT TROPONIN, ED: TROPONIN I, POC: 0 ng/mL (ref 0.00–0.08)

## 2016-07-24 LAB — APTT: aPTT: 35 seconds (ref 24–36)

## 2016-07-24 MED ORDER — ACETAMINOPHEN 325 MG PO TABS
650.0000 mg | ORAL_TABLET | ORAL | Status: DC | PRN
  Administered 2016-07-30: 650 mg via ORAL
  Filled 2016-07-24: qty 2

## 2016-07-24 MED ORDER — SODIUM CHLORIDE 0.9 % IV SOLN
INTRAVENOUS | Status: DC
  Administered 2016-07-25 – 2016-08-01 (×11): via INTRAVENOUS

## 2016-07-24 MED ORDER — CLEVIDIPINE BUTYRATE 0.5 MG/ML IV EMUL
INTRAVENOUS | Status: AC
  Filled 2016-07-24: qty 50

## 2016-07-24 MED ORDER — LABETALOL HCL 5 MG/ML IV SOLN
10.0000 mg | INTRAVENOUS | Status: DC | PRN
  Administered 2016-07-24: 10 mg via INTRAVENOUS
  Filled 2016-07-24: qty 4

## 2016-07-24 MED ORDER — ACETAMINOPHEN 160 MG/5ML PO SOLN
650.0000 mg | ORAL | Status: DC | PRN
  Administered 2016-07-27: 325 mg
  Filled 2016-07-24: qty 20.3

## 2016-07-24 MED ORDER — SENNOSIDES-DOCUSATE SODIUM 8.6-50 MG PO TABS
1.0000 | ORAL_TABLET | Freq: Two times a day (BID) | ORAL | Status: DC
Start: 2016-07-24 — End: 2016-08-01
  Administered 2016-07-27 – 2016-08-01 (×5): 1 via ORAL
  Filled 2016-07-24 (×5): qty 1

## 2016-07-24 MED ORDER — LABETALOL HCL 5 MG/ML IV SOLN
20.0000 mg | Freq: Once | INTRAVENOUS | Status: AC
  Administered 2016-07-24: 20 mg via INTRAVENOUS

## 2016-07-24 MED ORDER — CLEVIDIPINE BUTYRATE 0.5 MG/ML IV EMUL
0.0000 mg/h | INTRAVENOUS | Status: DC
  Administered 2016-07-24: 15 mg/h via INTRAVENOUS
  Administered 2016-07-24: 1 mg/h via INTRAVENOUS
  Administered 2016-07-25: 13 mg/h via INTRAVENOUS
  Administered 2016-07-25 (×2): 12 mg/h via INTRAVENOUS
  Administered 2016-07-25: 1 mg/h via INTRAVENOUS
  Administered 2016-07-25: 12 mg/h via INTRAVENOUS
  Administered 2016-07-25: 6 mg/h via INTRAVENOUS
  Filled 2016-07-24 (×6): qty 50
  Filled 2016-07-24: qty 100
  Filled 2016-07-24: qty 50

## 2016-07-24 MED ORDER — STROKE: EARLY STAGES OF RECOVERY BOOK
Freq: Once | Status: DC
Start: 2016-07-24 — End: 2016-08-01
  Filled 2016-07-24: qty 1

## 2016-07-24 MED ORDER — PANTOPRAZOLE SODIUM 40 MG IV SOLR
40.0000 mg | Freq: Every day | INTRAVENOUS | Status: DC
  Administered 2016-07-24 – 2016-07-31 (×8): 40 mg via INTRAVENOUS
  Filled 2016-07-24 (×8): qty 40

## 2016-07-24 MED ORDER — ACETAMINOPHEN 650 MG RE SUPP
650.0000 mg | RECTAL | Status: DC | PRN
  Administered 2016-07-25 – 2016-07-26 (×3): 650 mg via RECTAL
  Filled 2016-07-24 (×3): qty 1

## 2016-07-24 MED ORDER — LABETALOL HCL 5 MG/ML IV SOLN
INTRAVENOUS | Status: AC
  Filled 2016-07-24: qty 4

## 2016-07-24 MED ORDER — NICARDIPINE HCL IN NACL 20-0.86 MG/200ML-% IV SOLN: 0.0000 mg/h | INTRAVENOUS | Status: DC

## 2016-07-24 NOTE — ED Triage Notes (Signed)
Patient here by EMS for Code Stroke and associated fall.  Patient is on non rebreather with shallow respirations.  Patient is alert to voice and pain.  LSN [id number] found at 1430 prone after fall in garage.  History of Hypertension.

## 2016-07-24 NOTE — Progress Notes (Signed)
eLink Physician-Brief Progress Note Patient Name: Heidi Green DOB: Feb 05, 1956 MRN: [id number]   Date of Service  07/24/2016  HPI/Events of Note  ICH, HTN, camera in , no distress BP controllled on clev  eICU Interventions  For MRI infuture clev for Bp control Monitor neuro status     Intervention Category Evaluation Type: New Patient Evaluation  Nelda Bucks. 07/24/2016, 7:21 PM

## 2016-07-24 NOTE — Code Documentation (Signed)
61 y.o. female w/ PMHx of HTN, headache, chest pain and back pain who was in was seen normal this morning by her husband at 0930. The husband then found her laying in the garage unresponsive at 1430. EMS was notified and activated code stroke. Upon arrival pt obtunded, unable to follow commands and incompressible speech. EDP cleared airway for CT. EMS reporting patient had fall. Pt placed in cervical collar. CT showing Intraparenchymal hemorrhage in the left basal ganglia measuring 4.8 x 2.7 x 3.5 cm, volume 24 cc. Mild surrounding edema. Mild mass effect with left-to-right shift of 2 mm. tPA not given d/t ICH. Pt taken back to ED. Delay in lowering BP d/t difficulty in obtaining IV access. Once access achieved, 20 mg labetalol given. Order given to start cleviprex gtt if BP still elevated. Pt w/ allergy to amlodipine. Rx notified and stated either would be ok to give. EDP notified and deferred to neuro. Neuro MD notified and stated to give cleviprex and watch patient closely for reaction. Pt monitored frequently. Bedside handoff w/ ED RN Arlys John.

## 2016-07-24 NOTE — H&P (Signed)
History and physical    HPI:                                                                                                                                         Heidi Green is an 61 y.o. female who was last seen normal by her husband at 930 this AM. HE came home at 1500 hours and found her on the floor of the garage with AMS and not moving. She was brought to [facility] [facility] as a code stroke and found to have a left intraparenchymal hemorrhage.  She is holding her left knee in flexion due to pain. Likely has suffered a contusion or fracture to that leg.   Date last known well: Date: 07/24/2016 Time last known well: Time: 09:30 tPA Given: No: ICH   Intracerebral Hemorrhage (ICH) Score 0  Past Medical History:  Diagnosis Date  . Arthritis    in back  . Back pain   . Chest pain 06/16/2012   negative cath with normal LV function  . Headache(784.0)   . Hypertension   . Kidney stones     Past Surgical History:  Procedure Laterality Date  . ABDOMINAL HYSTERECTOMY    . BACK SURGERY    . LEFT HEART CATHETERIZATION WITH CORONARY ANGIOGRAM N/A 06/16/2012   Procedure: LEFT HEART CATHETERIZATION WITH CORONARY ANGIOGRAM;  Surgeon: Peter M Swaziland, MD;  Location: [facility] CATH LAB;  Service: Cardiovascular;  Laterality: N/A;  . LITHOTRIPSY    . TUBAL LIGATION      Family History  Problem Relation Age of Onset  . Emphysema Mother   . Diabetes Mother    Social History:  reports that she quit smoking about 4 years ago. Her smoking use included Cigarettes. She has a 13.00 pack-year smoking history. She has never used smokeless tobacco. She reports that she does not drink alcohol or use drugs.  Allergies:  Allergies  Allergen Reactions  . Garamycin [Gentamicin Sulfate] Hives and Shortness Of Breath  . Penicillins Anaphylaxis, Itching and Swelling    Has patient had a PCN reaction causing immediate rash, facial/tongue/throat swelling, SOB or lightheadedness with hypotension: Yes Has  patient had a PCN reaction causing severe rash involving mucus membranes or skin necrosis: No Has patient had a PCN reaction that required hospitalization No Has patient had a PCN reaction occurring within the last 10 years: No If all of the above answers are "NO", then may proceed with Cephalosporin use.   . Norvasc [Amlodipine Besylate] Nausea And Vomiting    Medications:  No current facility-administered medications for this encounter.    Current Outpatient Prescriptions  Medication Sig Dispense Refill  . baclofen (LIORESAL) 10 MG tablet Take 10 mg by mouth 2 (two) times daily.     . cyanocobalamin (,VITAMIN B-12,) 1000 MCG/ML injection See admin instructions. Inject 1 ml in the muscle weekly for 4 weeks, then 1 ml monthly thereafter.    . fluticasone (FLONASE) 50 MCG/ACT nasal spray Place into both nostrils daily. As directed.    . furosemide (LASIX) 40 MG tablet Take 40 mg by mouth daily.    Marland Kitchen HYDROcodone-acetaminophen (NORCO/VICODIN) 5-325 MG tablet Take 1 tablet by mouth 3 (three) times daily.    Marland Kitchen lisinopril (PRINIVIL,ZESTRIL) 2.5 MG tablet Take 2.5 mg by mouth daily.    . Loratadine (CLARITIN PO) Take 1 tablet by mouth daily.    Marland Kitchen NAPROXEN DR 500 MG EC tablet Take 500 mg by mouth 2 (two) times daily.    Marland Kitchen rOPINIRole (REQUIP) 2 MG tablet Take 2 mg by mouth 2 (two) times daily.       ROS:                                                                                                                                       History obtained from unobtainable from patient due to mental status    Neurologic Examination:                                                                                                      There were no vitals taken for this visit.  HEENT-  Normocephalic, no lesions, without obvious abnormality.  Normal external eye and conjunctiva.   Normal TM's bilaterally.  Normal auditory canals and external ears. Normal external nose, mucus membranes and septum.  Normal pharynx. Cardiovascular- S1, S2 normal, pulses palpable throughout   Lungs- tachypnea is noted Abdomen- normal findings: bowel sounds normal Extremities- LE edema Lymph-no adenopathy palpable Musculoskeletal-Left knee pain Skin-warm and dry, no hyperpigmentation, vitiligo, or suspicious lesions  Neurological Examination        Mental Status: Alert, moaning in pain.  Able to follow 3 simple commands Cranial Nerves: II:  Visual fields grossly normal,  III,IV, VI: ptosis not present, extra-ocular motions intact bilaterally, pupils equal, round, reactive to light and accommodation V,VII: right facial droop, facial light touch sensation normal bilaterally VIII: hearing normal bilaterally IX,X: uvula rises symmetrically XI: bilateral shoulder shrug XII: midline tongue extension Motor: Right : Upper extremity   4/5  Left:     Upper extremity   5/5  Lower extremity   5/5     Lower extremity   note Tone and bulk:normal tone throughout; no atrophy noted  Left leg held in flexion due to pain and likely injury Sensory: Pinprick and light touch intact throughout, bilaterally Deep Tendon Reflexes: 2+ and symmetric throughout Plantars: Right: downgoing   Left: downgoing Cerebellar: normal finger-to-nose,  Gait: not tested       Lab Results: Basic Metabolic Panel:  Recent Labs Lab 07/18/16 0345 07/18/16 1000 07/19/16 0346 07/24/16 1555  NA 144 144 139 140  K 3.5 3.5 3.9 3.8  CL 109 109 108 107  CO2 --   GLUCOSE 108* 92 103* 107*  BUN 25* CREATININE 1.09* 0.83 0.71 0.60  CALCIUM 8.7* 9.1 8.7*  --     Liver Function Tests:  Recent Labs Lab 07/18/16 0345 07/18/16 1000  AST 22 23  ALT 13* 13*  ALKPHOS 101 93  BILITOT 0.5 0.8  PROT 7.1 7.8  ALBUMIN 3.9 4.3   No results for input(s): LIPASE, AMYLASE in the last 168  hours.  Recent Labs Lab 07/18/16 1000  AMMONIA 34    CBC:  Recent Labs Lab 07/18/16 0345 07/18/16 1000 07/19/16 0346 07/24/16 1555  WBC 11.1* 13.3* 10.7*  --   NEUTROABS 7.0 8.5*  --   --   HGB 12.6 14.1 12.7 13.9  HCT 38.9 43.0 39.0 41.0  MCV 89.2 89.8 89.0  --   PLT 238 243 230  --     Cardiac Enzymes: No results for input(s): CKTOTAL, CKMB, CKMBINDEX, TROPONINI in the last 168 hours.  Lipid Panel: No results for input(s): CHOL, TRIG, HDL, CHOLHDL, VLDL, LDLCALC in the last 168 hours.  CBG:  Recent Labs Lab 07/18/16 1133 07/18/16 1730 07/18/16 2327 07/19/16 0502 07/24/16 1549  GLUCAP 76 125* 101* 99 95    Microbiology: Results for orders placed or performed during the hospital encounter of 07/18/16  MRSA PCR Screening     Status: None   Collection Time: 07/18/16  8:32 AM  Result Value Ref Range Status   MRSA by PCR NEGATIVE NEGATIVE Final    Comment:        The GeneXpert MRSA Assay (FDA approved for NASAL specimens only), is one component of a comprehensive MRSA colonization surveillance program. It is not intended to diagnose MRSA infection nor to guide or monitor treatment for MRSA infections.     Coagulation Studies: No results for input(s): LABPROT, INR in the last 72 hours.  Imaging: No results found.     Assessment and plan discussed with with attending physician and they are in agreement.    Felicie Morn PA-C [facility] [phone]  07/24/2016, 4:11 PM   Assessment: 61 y.o. female presenting to hospital with AMS and left intraparenchymal hemorrhage.   Stroke Risk Factors - hypertension  Recommend: 1) Admit to ICU 2) BP parameters SBP <140 and Diastolic <90 3) CT heat and CTA head and neck in AM

## 2016-07-24 NOTE — ED Provider Notes (Signed)
[facility]-EMERGENCY DEPT Provider Note   CSN: [id number] Arrival date & time: 07/24/16  1545     History   Chief Complaint No chief complaint on file.   HPI Heidi Green is a 61 y.o. female.  The history is provided by the EMS personnel and medical records.  Cerebrovascular Accident  This is a new problem. Episode onset: Unknown; LKN 0930hrs. The problem occurs constantly. The problem has not changed since onset.Associated symptoms comments: Unable to obtain ROS. Nothing aggravates the symptoms. Nothing relieves the symptoms. She has tried nothing for the symptoms.    Past Medical History:  Diagnosis Date  . Arthritis    in back  . Back pain   . Chest pain 06/16/2012   negative cath with normal LV function  . Headache(784.0)   . Hypertension   . Kidney stones     Patient Active Problem List   Diagnosis Date Noted  . Class 2 obesity   . Acute encephalopathy 07/18/2016  . NSTEMI (non-ST elevated myocardial infarction) (HCC) 06/17/2012  . Chest pain 06/16/2012  . HTN (hypertension) 06/16/2012  . Dyslipidemia 06/16/2012  . Back pain 06/16/2012  . Tobacco abuse 06/16/2012    Past Surgical History:  Procedure Laterality Date  . ABDOMINAL HYSTERECTOMY    . BACK SURGERY    . LEFT HEART CATHETERIZATION WITH CORONARY ANGIOGRAM N/A 06/16/2012   Procedure: LEFT HEART CATHETERIZATION WITH CORONARY ANGIOGRAM;  Surgeon: Peter M Swaziland, MD;  Location: [facility] CATH LAB;  Service: Cardiovascular;  Laterality: N/A;  . LITHOTRIPSY    . TUBAL LIGATION      OB History    No data available       Home Medications    Prior to Admission medications   Medication Sig Start Date End Date Taking? Authorizing Provider  baclofen (LIORESAL) 10 MG tablet Take 10 mg by mouth 2 (two) times daily.  06/21/16   Historical Provider, MD  cyanocobalamin (,VITAMIN B-12,) 1000 MCG/ML injection See admin instructions. Inject 1 ml in the muscle weekly for 4 weeks, then 1 ml monthly thereafter. 07/11/16    Historical Provider, MD  fluticasone (FLONASE) 50 MCG/ACT nasal spray Place into both nostrils daily. As directed.    Historical Provider, MD  furosemide (LASIX) 40 MG tablet Take 40 mg by mouth daily.    Historical Provider, MD  HYDROcodone-acetaminophen (NORCO/VICODIN) 5-325 MG tablet Take 1 tablet by mouth 3 (three) times daily. 06/21/16   Historical Provider, MD  lisinopril (PRINIVIL,ZESTRIL) 2.5 MG tablet Take 2.5 mg by mouth daily.    Historical Provider, MD  Loratadine (CLARITIN PO) Take 1 tablet by mouth daily.    Historical Provider, MD  NAPROXEN DR 500 MG EC tablet Take 500 mg by mouth 2 (two) times daily. 05/23/16   Historical Provider, MD  rOPINIRole (REQUIP) 2 MG tablet Take 2 mg by mouth 2 (two) times daily.    Historical Provider, MD    Family History Family History  Problem Relation Age of Onset  . Emphysema Mother   . Diabetes Mother     Social History Social History  Substance Use Topics  . Smoking status: Former Smoker    Packs/day: 0.50    Years: 26.00    Types: Cigarettes    Quit date: 06/17/2012  . Smokeless tobacco: Never Used  . Alcohol use No     Allergies   Garamycin [gentamicin sulfate]; Penicillins; and Norvasc [amlodipine besylate]   Review of Systems Review of Systems  Unable to perform ROS: Mental status  change    Physical Exam Updated Vital Signs There were no vitals taken for this visit.  Physical Exam  Constitutional: She appears well-developed and well-nourished. No distress.  HENT:  Head: Normocephalic and atraumatic.  Eyes: Conjunctivae are normal.  Neck:  Make-shift towel collar in place  Cardiovascular: Regular rhythm.   No murmur heard. Tachycardic  Pulmonary/Chest: Breath sounds normal. No stridor. No respiratory distress.  Tachypneic, NRB in place  Abdominal: Soft. There is no tenderness.  Musculoskeletal:  Holding left knee in flexion  Neurological: She is alert.  Moaning in response to questions, followed simple  commands, moving b/l UE and RLE  Skin: Skin is warm and dry.  Psychiatric: She has a normal mood and affect.  Nursing note and vitals reviewed.    ED Treatments / Results  Labs (all labs ordered are listed, but only abnormal results are displayed) Labs Reviewed  I-STAT CHEM 8, ED - Abnormal; Notable for the following:       Result Value   Glucose, Bld 107 (*)    Calcium, Ion 1.01 (*)    All other components within normal limits  PROTIME-INR  APTT  CBC  DIFFERENTIAL  COMPREHENSIVE METABOLIC PANEL  I-STAT TROPOININ, ED  CBG MONITORING, ED    EKG  EKG Interpretation None       Radiology No results found.  Procedures Procedures (including critical care time)  Medications Ordered in ED Medications - No data to display   Initial Impression / Assessment and Plan / ED Course  I have reviewed the triage vital signs and the nursing notes.  Pertinent labs & imaging results that were available during my care of the patient were reviewed by me and considered in my medical decision making (see chart for details).    Pt presents as a CODE STROKE for reported right facial droop in the field. Neurology in the ED at the time of Pt arrival. Labs & imaging ordered per protocol.  H/o HTN, chronic pain, and repeated episodes of encephalopathy (d/c from [facility] on 4/6 for same). Per the Pt's husband, he didn't get a response to text message he sent her at 1130, so he went home to check on her; he says he found her lying on the ground in their garage & unable to speak coherent words to him, so he called 911. EMS reported that the Pt's SaO2 was in the 80s when they arrived & she had some left-sided facial droop; they applied a NRB & called ahead for a possible stroke.  Imaging shows intraparenchymal hematoma in the left basal ganglia w/63mm of shift. Labs remarkable for WBC 16.2.  Will admit the Pt to the Neuro ICU for further evaluation and management.  Final Clinical Impressions(s) / ED  Diagnoses   Final diagnoses:  Altered mental state  ICH (intracerebral hemorrhage) (HCC)  ICH (intracerebral hemorrhage) (HCC)  ICH (intracerebral hemorrhage) (HCC)  ICH (intracerebral hemorrhage) (HCC)    New Prescriptions New Prescriptions   No medications on file     Forest Becker, MD 07/25/16 1610    Maia Plan, MD 07/25/16 1022

## 2016-07-25 ENCOUNTER — Inpatient Hospital Stay (HOSPITAL_COMMUNITY): Payer: Medicare HMO

## 2016-07-25 ENCOUNTER — Encounter (HOSPITAL_COMMUNITY): Payer: Self-pay | Admitting: Radiology

## 2016-07-25 DIAGNOSIS — R1312 Dysphagia, oropharyngeal phase: Secondary | ICD-10-CM

## 2016-07-25 DIAGNOSIS — D72829 Elevated white blood cell count, unspecified: Secondary | ICD-10-CM

## 2016-07-25 DIAGNOSIS — I161 Hypertensive emergency: Secondary | ICD-10-CM

## 2016-07-25 LAB — BASIC METABOLIC PANEL
Anion gap: 11 (ref 5–15)
BUN: 8 mg/dL (ref 6–20)
CO2: 23 mmol/L (ref 22–32)
Calcium: 9.3 mg/dL (ref 8.9–10.3)
Chloride: 106 mmol/L (ref 101–111)
Creatinine, Ser: 0.67 mg/dL (ref 0.44–1.00)
GFR calc Af Amer: >60 mL/min (ref >60)
GLUCOSE: 109 mg/dL — AB (ref 65–99)
POTASSIUM: 3.4 mmol/L — AB (ref 3.5–5.1)
Sodium: 140 mmol/L (ref 135–145)

## 2016-07-25 LAB — CBC
HCT: 41.2 % (ref 36.0–46.0)
Hemoglobin: 13.6 g/dL (ref 12.0–15.0)
MCH: 28.9 pg (ref 26.0–34.0)
MCHC: 33 g/dL (ref 30.0–36.0)
MCV: 87.7 fL (ref 78.0–100.0)
PLATELETS: 277 10*3/uL (ref 150–400)
RBC: 4.7 MIL/uL (ref 3.87–5.11)
RDW: 14.4 % (ref 11.5–15.5)
WBC: 17 10*3/uL — ABNORMAL HIGH (ref 4.0–10.5)

## 2016-07-25 LAB — VITAMIN B12: Vitamin B-12: 1181 pg/mL — ABNORMAL HIGH (ref 180–914)

## 2016-07-25 LAB — LIPID PANEL
CHOLESTEROL: 195 mg/dL (ref 0–200)
HDL: 65 mg/dL (ref >40)
LDL Cholesterol: 109 mg/dL — ABNORMAL HIGH (ref 0–99)
Total CHOL/HDL Ratio: 3 RATIO
Triglycerides: 107 mg/dL (ref <150)
VLDL: 21 mg/dL (ref 0–40)

## 2016-07-25 LAB — HEMOGLOBIN A1C
Hgb A1c MFr Bld: 5.2 % (ref 4.8–5.6)
MEAN PLASMA GLUCOSE: 103 mg/dL

## 2016-07-25 LAB — RPR: RPR: NONREACTIVE

## 2016-07-25 LAB — GLUCOSE, CAPILLARY: Glucose-Capillary: 103 mg/dL — ABNORMAL HIGH (ref 65–99)

## 2016-07-25 LAB — TSH: TSH: 1.527 u[IU]/mL (ref 0.350–4.500)

## 2016-07-25 LAB — HIV ANTIBODY (ROUTINE TESTING W REFLEX): HIV SCREEN 4TH GENERATION: NONREACTIVE

## 2016-07-25 MED ORDER — MORPHINE SULFATE (PF) 2 MG/ML IV SOLN
1.0000 mg | Freq: Once | INTRAVENOUS | Status: AC
  Administered 2016-07-25: 1 mg via INTRAVENOUS

## 2016-07-25 MED ORDER — ACETAMINOPHEN 10 MG/ML IV SOLN
1000.0000 mg | Freq: Once | INTRAVENOUS | Status: AC
  Administered 2016-07-25: 1000 mg via INTRAVENOUS
  Filled 2016-07-25: qty 100

## 2016-07-25 MED ORDER — IOPAMIDOL (ISOVUE-370) INJECTION 76%
INTRAVENOUS | Status: AC
  Filled 2016-07-25: qty 50

## 2016-07-25 MED ORDER — MORPHINE SULFATE (PF) 2 MG/ML IV SOLN
INTRAVENOUS | Status: AC
  Filled 2016-07-25: qty 1

## 2016-07-25 NOTE — Care Management Note (Signed)
Case Management Note  Patient Details  Name: Heidi Green MRN: [id number] Date of Birth: 1955/09/22  Subjective/Objective:  Pt admitted on 07/24/16 with AMS and Lt intraparenchymal hemorrhage.  PTA, pt independent, lives with spouse.                   Action/Plan: PT/OT evals pending.  Will follow for discharge planning s pt progresses.   Expected Discharge Date:                  Expected Discharge Plan:  IP Rehab Facility  In-House Referral:     Discharge planning Services  CM Consult  Post Acute Care Choice:    Choice offered to:     DME Arranged:    DME Agency:     HH Arranged:    HH Agency:     Status of Service:  In process, will continue to follow  If discussed at Long Length of Stay Meetings, dates discussed:    Additional Comments:  Glennon Mac, RN 07/25/2016, 5:13 PM

## 2016-07-25 NOTE — Progress Notes (Signed)
OT Cancellation Note  Patient Details Name: Heidi Green MRN: [id number] DOB: 04/13/1956   Cancelled Treatment:    Reason Eval/Treat Not Completed: Patient not medically ready. Pt with active bedrest orders. Please update activity orders when appropriate for therapy. Thanks  [facility] [place], OT/L  [phone] 07/25/2016 07/25/2016, 7:48 AM

## 2016-07-25 NOTE — Progress Notes (Signed)
SLP Cancellation Note  Patient Details Name: Heidi Green MRN: [id number] DOB: November 17, 1955   Cancelled treatment:        Returned for swallow assessment however pt moaning in pain (RN going to give IV Tylenol). Moaned louder as SLP attempted to raise head of bed. SLP will return tomorrow for swallow assessment.    Ilaisaane, Marts 07/25/2016, 2:32 PM

## 2016-07-25 NOTE — ED Notes (Signed)
Patient is stable and ready to be transport to the floor at this time.  Report was called to 4N RN.  Belongings taken with the patient to the floor.   

## 2016-07-25 NOTE — Progress Notes (Signed)
SLP Cancellation Note  Patient Details Name: Heidi Green MRN: [id number] DOB: January 27, 1956   Cancelled treatment:        Pt unable to maintain alertness for swallow assessment given frequent max cues. Will plan to return later today   Evah, Rashid 07/25/2016, 9:06 AM   Breck Coons Lonell Face.Ed [employer] [phone]

## 2016-07-25 NOTE — Progress Notes (Signed)
PT Cancellation Note  Patient Details Name: Heidi Green MRN: [id number] DOB: Apr 29, 1955   Cancelled Treatment:    Reason Eval/Treat Not Completed: Medical issues which prohibited therapy Holding PT evaluation secondary to uncontrolled BP and lethargy. Will follow up as appropriate.   Blake Divine A Cabria Micalizzi 07/25/2016, 9:34 AM Mylo Red, PT, DPT [phone]

## 2016-07-25 NOTE — Progress Notes (Signed)
STROKE TEAM PROGRESS NOTE   HISTORY OF PRESENT ILLNESS (per record) Heidi Green is an 61 y.o. female who was last seen normal by her husband at 46 this AM 07/24/2016 (LKW). He came home at 1500 hours and found her on the floor of the garage with AMS and not moving. She was brought to [facility] hospital as a code stroke and found to have a left intraparenchymal hemorrhage.  She is holding her left knee in flexion due to pain. Likely has suffered a contusion or fracture to that leg. She was admitted to the neuro ICU for further evaluation and treatment.   SUBJECTIVE (INTERVAL HISTORY) Husband at bedside. Patient is sleepy drowsy, briefly open eyes on voice. Left gaze preference and right hemiplegia. Still on Cleviprex for blood pressure control. Repeat CT stable hematoma. Not able to tolerating CTA head and neck. MRI canceled.   OBJECTIVE Temp:  [98.1 F (36.7 C)-99.1 F (37.3 C)] 99.1 F (37.3 C) (04/12 1200) Pulse Rate:  [71-112] 110 (04/12 1045) Cardiac Rhythm: Normal sinus rhythm (04/12 0800) Resp:  [11-35] 22 (04/12 1045) BP: (100-205)/(63-133) 116/73 (04/12 1045) SpO2:  [87 %-100 %] 96 % (04/12 1045) Weight:  [105.3 kg (232 lb 2.3 oz)] 105.3 kg (232 lb 2.3 oz) (04/11 1845)  CBC:  Recent Labs Lab 07/24/16 1619 07/25/16 0226  WBC 16.2* 17.0*  NEUTROABS 13.4*  --   HGB 14.5 13.6  HCT 44.5 41.2  MCV 88.3 87.7  PLT 239 277    Basic Metabolic Panel:  Recent Labs Lab 07/24/16 1619 07/25/16 0226  NA 142 140  K 3.6 3.4*  CL 105 106  CO2 27 23  GLUCOSE 108* 109*  BUN 11 8  CREATININE 0.75 0.67  CALCIUM 9.3 9.3    Lipid Panel:    Component Value Date/Time   CHOL 195 07/25/2016 0226   TRIG 107 07/25/2016 0226   HDL 65 07/25/2016 0226   CHOLHDL 3.0 07/25/2016 0226   VLDL 21 07/25/2016 0226   LDLCALC 109 (H) 07/25/2016 0226   HgbA1c: No results found for: HGBA1C Urine Drug Screen:    Component Value Date/Time   LABOPIA POSITIVE (A) 07/18/2016 0414   COCAINSCRNUR NONE  DETECTED 07/18/2016 0414   LABBENZ POSITIVE (A) 07/18/2016 0414   AMPHETMU NONE DETECTED 07/18/2016 0414   THCU NONE DETECTED 07/18/2016 0414   LABBARB NONE DETECTED 07/18/2016 0414    Alcohol Level No results found for: ETH  IMAGING I have personally reviewed the radiological images below and agree with the radiology interpretations.  Ct Head Code Stroke Wo Contrast 07/24/2016 1616  1. Intraparenchymal hemorrhage in the left basal ganglia measuring 4.8 x 2.7 x 3.5 cm, volume 24 cc. Mild surrounding edema. Mild mass effect with left-to-right shift of 2 mm. Old right frontal infarction.   Ct Head Wo Contrast 07/25/2016 0538 1. Motion degraded examination.  2. Unchanged size of intraparenchymal hematoma centered in the left basal ganglia with unchanged rightward midline shift measuring 3 mm.   Ct Cervical Spine Wo Contrast 07/24/2016 Mild degenerative change without acute abnormality.   TTE pending    PHYSICAL EXAM  Temp:  [98.1 F (36.7 C)-99.1 F (37.3 C)] 99.1 F (37.3 C) (04/12 1200) Pulse Rate:  [71-112] 83 (04/12 1600) Resp:  [11-35] 20 (04/12 1600) BP: (100-202)/(59-125) 123/67 (04/12 1600) SpO2:  [87 %-98 %] 94 % (04/12 1600) Weight:  [232 lb 2.3 oz (105.3 kg)] 232 lb 2.3 oz (105.3 kg) (04/11 1845)  General - Well nourished, well developed,  drowsy sleepy and lethargic  Ophthalmologic - Fundi not visualized due to noncooperation.  Cardiovascular - Regular rate and rhythm.  Neuro - drowsy, sleepy and lethargic, briefly open eyes on voice and pain. Not following commands. Left gaze preference, not crossing midline, PERRL, doll's eyes positive, right facial droop, tongue midline inside mouth. LUE purposeful movement and against gravity, RUE minor withdrawal on pain stimulation. BLE 2+/5 on pain. Sensation, coordination and gait not tested.   ASSESSMENT/PLAN Heidi Green is a 61 y.o. female with history of arthritis, chronic pain, HTN and kidney stones found down with  R hemiparesis. CT showed a L basal ganglia hemorrhage.   Stroke:  left BG hemorrhage secondary to hypertensive emergency  Resultant  lethargy and right hemiparesis  Code stroke CT L BG hemorrhage. Mild edema w/ 2mm L to R shift. 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 Keep in bed for today  Disposition:  pending   Hypertensive emergency  Blood pressure as high as 181/104, 205/85 in setting of neurologic symptoms   Started on Cleviprex   SBP goal < 140  Stable this a.m. On drip  Hyperlipidemia  Home meds:  No statin.   LDL 109  Will not add statin given current hemorrhage  Dysphagia  Did not pass swallow  Nothing by mouth  Speech following  Other Stroke Risk Factors  Former Cigarette smoker , quit 4 years ago   Urine drug screen positive for opiates and benzos  Obesity, Body mass index is 37.47 kg/m., recommend weight loss, diet and exercise as appropriate   Other Active Problems  Leukocytosis, WBC 17.0  Hospital day # 1  This patient is critically ill due to left BG hemorrhage, hypertensive emergency, dysphagia and at significant risk of neurological worsening, death form hematoma extension, heart failure, seizure, aspiration pneumonia. This patient's care requires constant monitoring of vital signs, hemodynamics, respiratory and cardiac monitoring, review of multiple databases, neurological assessment, discussion with family, other specialists and medical decision making of high complexity. I spent 40 minutes of neurocritical care time in the care of this patient. I had discussion with husband at bedside, updated patient current condition, treatment AND potential  prognosis. Husband expressed understanding.  Marvel Plan, MD PhD Stroke Neurology 07/25/2016 5:44 PM   To contact Stroke Continuity provider, please refer to [email or web address]. After hours, contact General Neurology

## 2016-07-26 ENCOUNTER — Inpatient Hospital Stay (HOSPITAL_COMMUNITY): Payer: Medicare HMO

## 2016-07-26 ENCOUNTER — Encounter (HOSPITAL_COMMUNITY): Payer: Self-pay | Admitting: *Deleted

## 2016-07-26 DIAGNOSIS — I6789 Other cerebrovascular disease: Secondary | ICD-10-CM

## 2016-07-26 LAB — URINALYSIS, COMPLETE (UACMP) WITH MICROSCOPIC
BILIRUBIN URINE: NEGATIVE
Glucose, UA: NEGATIVE mg/dL
HGB URINE DIPSTICK: NEGATIVE
KETONES UR: 15 mg/dL — AB
NITRITE: NEGATIVE
Protein, ur: NEGATIVE mg/dL
RBC / HPF: NONE SEEN RBC/hpf (ref 0–5)
SPECIFIC GRAVITY, URINE: 1.02 (ref 1.005–1.030)
pH: 5.5 (ref 5.0–8.0)

## 2016-07-26 LAB — BASIC METABOLIC PANEL
Anion gap: 12 (ref 5–15)
BUN: 10 mg/dL (ref 6–20)
CALCIUM: 9 mg/dL (ref 8.9–10.3)
CO2: 22 mmol/L (ref 22–32)
CREATININE: 0.69 mg/dL (ref 0.44–1.00)
Chloride: 107 mmol/L (ref 101–111)
Glucose, Bld: 90 mg/dL (ref 65–99)
Potassium: 3.4 mmol/L — ABNORMAL LOW (ref 3.5–5.1)
SODIUM: 141 mmol/L (ref 135–145)

## 2016-07-26 LAB — ECHOCARDIOGRAM COMPLETE
FS: 35 % (ref 28–44)
Height: 66 in
IV/PV OW: 0.93
LADIAMINDEX: 1.69 cm/m2
LASIZE: 36 mm
LEFT ATRIUM END SYS DIAM: 36 mm
LV PW d: 14 mm — AB (ref 0.6–1.1)
LVOT area: 5.31 cm2
LVOTD: 26 mm
Weight: 3714.31 oz

## 2016-07-26 LAB — RAPID URINE DRUG SCREEN, HOSP PERFORMED
Amphetamines: NOT DETECTED
Barbiturates: NOT DETECTED
Benzodiazepines: NOT DETECTED
COCAINE: NOT DETECTED
OPIATES: POSITIVE — AB
TETRAHYDROCANNABINOL: NOT DETECTED

## 2016-07-26 LAB — CBC
HCT: 42.3 % (ref 36.0–46.0)
HEMOGLOBIN: 13.8 g/dL (ref 12.0–15.0)
MCH: 28.7 pg (ref 26.0–34.0)
MCHC: 32.6 g/dL (ref 30.0–36.0)
MCV: 87.9 fL (ref 78.0–100.0)
PLATELETS: 271 10*3/uL (ref 150–400)
RBC: 4.81 MIL/uL (ref 3.87–5.11)
RDW: 14.8 % (ref 11.5–15.5)
WBC: 12.7 10*3/uL — ABNORMAL HIGH (ref 4.0–10.5)

## 2016-07-26 MED ORDER — LORAZEPAM 2 MG/ML IJ SOLN
INTRAMUSCULAR | Status: AC
  Filled 2016-07-26: qty 1

## 2016-07-26 MED ORDER — LABETALOL HCL 5 MG/ML IV SOLN
10.0000 mg | INTRAVENOUS | Status: DC | PRN
  Administered 2016-07-27 – 2016-07-31 (×9): 10 mg via INTRAVENOUS
  Filled 2016-07-26 (×9): qty 4

## 2016-07-26 MED ORDER — MORPHINE SULFATE (PF) 2 MG/ML IV SOLN
1.0000 mg | INTRAVENOUS | Status: DC | PRN
  Administered 2016-07-26 – 2016-07-28 (×7): 1 mg via INTRAVENOUS
  Filled 2016-07-26 (×7): qty 1

## 2016-07-26 MED ORDER — LORAZEPAM 2 MG/ML IJ SOLN
1.0000 mg | Freq: Once | INTRAMUSCULAR | Status: AC
  Administered 2016-07-26: 1 mg via INTRAVENOUS

## 2016-07-26 NOTE — Progress Notes (Signed)
Rehab Admissions Coordinator Note:  Patient was screened by Trish Mage for appropriateness for an Inpatient Acute Rehab Consult.  At this time, patient is on drips and is total assist +2 for mobility.  Not ready yet for inpatient rehab consult.  I will follow progress and check on Monday to see if patient is participating more fully.  Trish Mage 07/26/2016, 2:40 PM  I can be reached at [phone].

## 2016-07-26 NOTE — Progress Notes (Signed)
Spoke with Dr.Kirkpatrick, new order for morphine recieved

## 2016-07-26 NOTE — Progress Notes (Signed)
STROKE TEAM PROGRESS NOTE   SUBJECTIVE (INTERVAL HISTORY) Husband at bedside. Patient is sleepy drowsy, but more awake alert, open eyes on voice and able to answer some questions but severe dysarthria. Repeat CT this am showed stable left BG ICH. UA negative.    OBJECTIVE Temp:  [98 F (36.7 C)-100.3 F (37.9 C)] 98.6 F (37 C) (04/13 2000) Pulse Rate:  [76-113] 80 (04/13 2200) Cardiac Rhythm: Normal sinus rhythm (04/13 2000) Resp:  [12-31] 21 (04/13 2200) BP: (103-157)/(58-128) 130/68 (04/13 2200) SpO2:  [90 %-99 %] 96 % (04/13 2200)  CBC:   Recent Labs Lab 07/24/16 1619 07/25/16 0226 07/26/16 0338  WBC 16.2* 17.0* 12.7*  NEUTROABS 13.4*  --   --   HGB 14.5 13.6 13.8  HCT 44.5 41.2 42.3  MCV 88.3 87.7 87.9  PLT 239 277 271    Basic Metabolic Panel:   Recent Labs Lab 07/25/16 0226 07/26/16 0338  NA 140 141  K 3.4* 3.4*  CL 106 107  CO2 23 22  GLUCOSE 109* 90  BUN 8 10  CREATININE 0.67 0.69  CALCIUM 9.3 9.0    Lipid Panel:     Component Value Date/Time   CHOL 195 07/25/2016 0226   TRIG 107 07/25/2016 0226   HDL 65 07/25/2016 0226   CHOLHDL 3.0 07/25/2016 0226   VLDL 21 07/25/2016 0226   LDLCALC 109 (H) 07/25/2016 0226   HgbA1c:  Lab Results  Component Value Date   HGBA1C 5.2 07/25/2016   Urine Drug Screen:     Component Value Date/Time   LABOPIA POSITIVE (A) 07/26/2016 1007   COCAINSCRNUR NONE DETECTED 07/26/2016 1007   LABBENZ NONE DETECTED 07/26/2016 1007   AMPHETMU NONE DETECTED 07/26/2016 1007   THCU NONE DETECTED 07/26/2016 1007   LABBARB NONE DETECTED 07/26/2016 1007    Alcohol Level No results found for: ETH  IMAGING I have personally reviewed the radiological images below and agree with the radiology interpretations.  Ct Head Code Stroke Wo Contrast 07/24/2016 1616  1. Intraparenchymal hemorrhage in the left basal ganglia measuring 4.8 x 2.7 x 3.5 cm, volume 24 cc. Mild surrounding edema. Mild mass effect with left-to-right shift  of 2 mm. Old right frontal infarction.   Ct Head Wo Contrast 07/25/2016 0538 1. Motion degraded examination.  2. Unchanged size of intraparenchymal hematoma centered in the left basal ganglia with unchanged rightward midline shift measuring 3 mm.   Ct Cervical Spine Wo Contrast 07/24/2016 Mild degenerative change without acute abnormality.   TTE - Left ventricle: The cavity size was normal. There was mild   concentric hypertrophy. Systolic function was normal. The   estimated ejection fraction was in the range of 55% to 60%. The   study is not technically sufficient to allow evaluation of LV   diastolic function. Impressions: - Limited, poor technical quality study. It should be repeated when   the patient is able to cooperate.   PHYSICAL EXAM  Temp:  [98 F (36.7 C)-100.3 F (37.9 C)] 98.6 F (37 C) (04/13 2000) Pulse Rate:  [76-113] 80 (04/13 2200) Resp:  [12-31] 21 (04/13 2200) BP: (103-157)/(58-128) 130/68 (04/13 2200) SpO2:  [90 %-99 %] 96 % (04/13 2200)  General - Well nourished, well developed, drowsy sleepy and lethargic  Ophthalmologic - Fundi not visualized due to noncooperation.  Cardiovascular - Regular rate and rhythm.  Neuro - drowsy, sleepy and lethargic, but more awake alert and arousable, and open eyes on voice. Orientated to place and people, but not age  and time. Able to following some simple commands. Left gaze preference, not crossing midline, PERRL, right facial droop, tongue protrusion to the right. LUE purposeful movement and against gravity at least 4/5, RUE minor withdrawal on pain stimulation. BLE 3+/5 on pain. Sensation, coordination and gait not tested.   ASSESSMENT/PLAN [name] is a 61 y.o. female with history of arthritis, chronic pain, HTN and kidney stones found down with R hemiparesis. CT showed a L basal ganglia hemorrhage.   Stroke:  left BG hemorrhage secondary to hypertensive emergency  Resultant  lethargy and right  hemiparesis  Code stroke CT L BG hemorrhage. Mild edema w/ 2mm L to R shift. 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  CT repeat am unchanged hemorrhage, shift now 3mm  CT repeat 07/26/16 no change of ICH and midline shift  CTA canceled, not tolerating  MRI cancelled   2D Echo EF 55-60%  LDL 109  HgbA1c 5.2  SCDs for VTE prophylaxis Diet NPO time specified  No antithrombotic prior to admission  Ongoing aggressive stroke risk factor management  Therapy recommendations:  pending  Disposition:  pending   Hypertensive emergency  Blood pressure as high as 181/104, 205/85 in setting of neurologic symptoms   Still on Cleviprex   SBP goal < 140 Will start po meds once pass swallow  Hyperlipidemia  Home meds:  No statin.   LDL 109  Will not add statin given current hemorrhage  Dysphagia  Did not pass swallow  Nothing by mouth  Speech following  Consider cortrak tomorrow if not able to pass swallow by then  Other Stroke Risk Factors  Former Cigarette smoker , quit 4 years ago   Urine drug screen positive for opiates and benzos  Obesity, Body mass index is 37.47 kg/m., recommend weight loss, diet and exercise as appropriate   Other Active Problems  Leukocytosis, WBC 17.0  Hospital day # 2  This patient is critically ill due to left BG hemorrhage, hypertensive emergency, dysphagia and at significant risk of neurological worsening, death form hematoma extension, heart failure, seizure, aspiration pneumonia. This patient's care requires constant monitoring of vital signs, hemodynamics, respiratory and cardiac monitoring, review of multiple databases, neurological assessment, discussion with family, other specialists and medical decision making of high complexity. I spent 40 minutes of neurocritical care time in the care of this patient. I had discussion with husband at bedside, updated patient current condition, treatment AND potential prognosis. Husband expressed  understanding.  Marvel Plan, MD PhD Stroke Neurology 07/26/2016 10:53 PM   To contact Stroke Continuity provider, please refer to [email or web address]. After hours, contact General Neurology

## 2016-07-26 NOTE — Progress Notes (Addendum)
07/26/16 -Spoke with Dr. Amada Jupiter with pt increase in anxiety restlessness, yelling out, but denying pain. No new orders, will monitor.

## 2016-07-26 NOTE — Evaluation (Signed)
Clinical/Bedside Swallow Evaluation Patient Details  Name: Heidi Green MRN: [id number] Date of Birth: 12-30-1955  Today's Date: 07/26/2016 Time: SLP Start Time (ACUTE ONLY): 1114 SLP Stop Time (ACUTE ONLY): 1135 SLP Time Calculation (min) (ACUTE ONLY): 21 min  Past Medical History:  Past Medical History:  Diagnosis Date  . Arthritis    in back  . Back pain   . Chest pain 06/16/2012   negative cath with normal LV function  . Headache(784.0)   . Hypertension   . Kidney stones    Past Surgical History:  Past Surgical History:  Procedure Laterality Date  . ABDOMINAL HYSTERECTOMY    . BACK SURGERY    . LEFT HEART CATHETERIZATION WITH CORONARY ANGIOGRAM N/A 06/16/2012   Procedure: LEFT HEART CATHETERIZATION WITH CORONARY ANGIOGRAM;  Surgeon: Peter M Swaziland, MD;  Location: [facility] CATH LAB;  Service: Cardiovascular;  Laterality: N/A;  . LITHOTRIPSY    . TUBAL LIGATION     HPI:  [name] is a 61 y.o. female with history of arthritis, chronic pain, HTN and kidney stones found down with R hemiparesis. CT showed a L basal ganglia hemorrhage.    Assessment / Plan / Recommendation Clinical Impression  Pt demonstrates a moderate to severe oral dysphagia with right sided CN XII and VII impairment leading to anterior spillage and pooling on the right. Despite oral deficits pt can initiate a swallow, though sips of water are followed by delayed coughing. Pt is progressively more alert each day, but was still lethargic during this assessment. Anticipate improved function if she were more alert. Will f/u for PO trials and potential MBS when pt alert for participation.  SLP Visit Diagnosis: Dysphagia, oropharyngeal phase (R13.12)    Aspiration Risk  Severe aspiration risk    Diet Recommendation NPO   Medication Administration: Via alternative means    Other  Recommendations Oral Care Recommendations: Oral care QID   Follow up Recommendations Inpatient Rehab      Frequency and Duration min  2x/week  2 weeks       Prognosis        Swallow Study   General HPI: [name] is a 61 y.o. female with history of arthritis, chronic pain, HTN and kidney stones found down with R hemiparesis. CT showed a L basal ganglia hemorrhage.  Type of Study: Bedside Swallow Evaluation Diet Prior to this Study: NPO Temperature Spikes Noted: N/A Respiratory Status: Nasal cannula History of Recent Intubation: No Behavior/Cognition: Lethargic/Drowsy;Cooperative Oral Care Completed by SLP: Recent completion by staff Oral Cavity - Dentition: Poor condition;Missing dentition Vision: Functional for self-feeding Self-Feeding Abilities: Needs assist Patient Positioning: Postural control adequate for testing Baseline Vocal Quality: Low vocal intensity Volitional Cough: Weak Volitional Swallow: Able to elicit    Oral/Motor/Sensory Function Overall Oral Motor/Sensory Function: Severe impairment Facial ROM: Reduced right;Suspected CN VII (facial) dysfunction Facial Symmetry: Abnormal symmetry right;Suspected CN VII (facial) dysfunction Facial Strength: Reduced right;Suspected CN VII (facial) dysfunction Facial Sensation: Reduced right;Suspected CN V (Trigeminal) dysfunction Lingual ROM: Reduced right;Suspected CN XII (hypoglossal) dysfunction Lingual Symmetry: Abnormal symmetry right;Suspected CN XII (hypoglossal) dysfunction Lingual Strength: Reduced;Suspected CN XII (hypoglossal) dysfunction   Ice Chips Ice chips: Impaired Presentation: Spoon Oral Phase Functional Implications: Right anterior spillage   Thin Liquid Thin Liquid: Impaired Presentation: Cup;Self Fed Oral Phase Impairments: Reduced labial seal;Reduced lingual movement/coordination Oral Phase Functional Implications: Right anterior spillage;Right lateral sulci pocketing Pharyngeal  Phase Impairments: Cough - Delayed    Nectar Thick Nectar Thick Liquid:  Not tested   Honey Thick Honey Thick Liquid: Not tested   Puree Puree:  Impaired Presentation: Spoon Oral Phase Impairments: Reduced labial seal;Reduced lingual movement/coordination Oral Phase Functional Implications: Prolonged oral transit;Right lateral sulci pocketing;Right anterior spillage   Solid   GO   Solid: Not tested        Claudine Mouton 07/26/2016,2:31 PM

## 2016-07-26 NOTE — Progress Notes (Signed)
Dr. Amada Jupiter notified regarding patients increased agitation and constant crying out. Pt remains aphasic and unable to express her concerns, which is increasing her frustration. Our concern is that the agitation has worsened over the shift; meaning possible neuro change. New order for Ativan and repeat CT ordered. Will continue to monitor.

## 2016-07-26 NOTE — Evaluation (Signed)
Physical Therapy Evaluation Patient Details Name: Heidi Green MRN: [id number] DOB: [birth year] Today's Date: 07/26/2016   History of Present Illness  Pt is a 61 y.o. female admitted to ED on 07/24/16 after being found down with altered mental status; CT showed L basal ganglia centered intraparenchymal hematoma. Pertinent PMH include HTN, back pain, arthritis.   Clinical Impression  Pt presents to PT with lethargy, R-side weakness, unintelligible speech, decreased awareness, impaired balance, and an overall decrease in functional mobility secondary to above. Pt extremely lethargic and difficult to understand. Demonstrates more strength and ROM spontaneously than on command. Husband states  PTA, pt lives at home with him and is mod indep with household amb using rollator; has hx of chronic back pain which impairs her mobility. Today, pt required totalA (+2) to sit EOB; demonstrating increased arousal in sitting. Inconsistent with nodding yes/no to questions and following one-step commands.   Pt would benefit from continued acute PT services to maximize functional mobility and independence.     Follow Up Recommendations CIR;Supervision/Assistance - 24 hour    Equipment Recommendations  Other (comment) (TBD)    Recommendations for Other Services Rehab consult;OT consult     Precautions / Restrictions Precautions Precautions: Fall Restrictions Weight Bearing Restrictions: No      Mobility  Bed Mobility Overal bed mobility: Needs Assistance Bed Mobility: Supine to Sit;Sit to Supine     Supine to sit: +2 for physical assistance;Total assist Sit to supine: Total assist;+2 for physical assistance      Transfers                    Ambulation/Gait                Stairs            Wheelchair Mobility    Modified Rankin (Stroke Patients Only) Modified Rankin (Stroke Patients Only) Pre-Morbid Rankin Score: No symptoms Modified Rankin: Severe disability      Balance Overall balance assessment: Needs assistance Sitting-balance support: Single extremity supported;Feet supported Sitting balance-Leahy Scale: Zero Sitting balance - Comments: TotalA to maintain seated balance; pt pushing strong through LUE in attempt to lay down. Unable to support with RUE Postural control: Right lateral lean;Posterior lean                                   Pertinent Vitals/Pain Pain Assessment: Faces Faces Pain Scale: Hurts little more Pain Location: Unable to communicate location Pain Intervention(s): Limited activity within patient's tolerance;Monitored during session;Repositioned    Home Living Family/patient expects to be discharged to:: Private residence Living Arrangements: Spouse/significant other Available Help at Discharge: Family;Available PRN/intermittently (Husband) Type of Home: House Home Access: Stairs to enter Entrance Stairs-Rails: Can reach both Entrance Stairs-Number of Steps: 3 Home Layout: Two level;Bed/bath upstairs Home Equipment: Walker - 4 wheels      Prior Function Level of Independence: Independent with assistive device(s)         Comments: Husband reports pt performs limited household amb mod indep with rollator secondary to chronic back pain; does not drive. Indep with ADLs. Mostly stays home     Hand Dominance        Extremity/Trunk Assessment   Upper Extremity Assessment Upper Extremity Assessment: Defer to OT evaluation;RUE deficits/detail RUE Deficits / Details: Pt holding RUE closed to body with shoulder IR/elbow flex, most likely secondary to pain. Bruises noted on RUE. 0/5 grip strength  to command    Lower Extremity Assessment Lower Extremity Assessment: RLE deficits/detail RLE Deficits / Details: Pt performing active hip flex, knee flex/ext and ankle DF/PF in bed; unable to do so in sitting on command.        Communication   Communication: Expressive difficulties (Dysarthria)   Cognition Arousal/Alertness: Lethargic Behavior During Therapy: Flat affect Overall Cognitive Status: Difficult to assess Area of Impairment: Attention;Following commands;Awareness;Orientation                 Orientation Level: Disoriented to;Person;Place;Time;Situation Current Attention Level: Focused   Following Commands: Follows one step commands inconsistently   Awareness: Intellectual   General Comments: Pt extremely lethargic throughout session, requiring max cues to participate with therapy and only occasionaly opening eyes briefly. Nods yes/no appropriately <25% of time; follows simple commands with multimodal cues <50% of time. Potential inattention towards R-side; pt moving RLE in bed, but unable to do so when asked in sitting.       General Comments General comments (skin integrity, edema, etc.): VSS    Exercises     Assessment/Plan    PT Assessment Patient needs continued PT services  PT Problem List Decreased strength;Decreased activity tolerance;Decreased balance;Decreased mobility;Decreased knowledge of precautions;Decreased knowledge of use of DME;Decreased cognition;Obesity       PT Treatment Interventions DME instruction;Therapeutic exercise;Balance training;Stair training;Gait training;Neuromuscular re-education;Functional mobility training;Cognitive remediation;Therapeutic activities;Patient/family education    PT Goals (Current goals can be found in the Care Plan section)  Acute Rehab PT Goals Patient Stated Goal: Unable to state goals PT Goal Formulation: Patient unable to participate in goal setting Time For Goal Achievement: 08/09/16    Frequency Min 4X/week   Barriers to discharge Inaccessible home environment;Decreased caregiver support Stairs to enter and to bedroom; husband works     Co-evaluation               End of Session   Activity Tolerance: Patient limited by lethargy Patient left: in bed;with call bell/phone within  reach;with bed alarm set Nurse Communication: Mobility status PT Visit Diagnosis: Other abnormalities of gait and mobility (R26.89);Other symptoms and signs involving the nervous system (R29.898)    Time: 1610-9604 PT Time Calculation (min) (ACUTE ONLY): 23 min   Charges:   PT Evaluation $PT Eval Moderate Complexity: 1 Procedure PT Treatments $Therapeutic Activity: 8-22 mins   PT G Codes:       Dewayne Hatch, SPT Office-[phone]  Ina Homes 07/26/2016, 12:23 PM

## 2016-07-26 NOTE — Progress Notes (Signed)
  Echocardiogram 2D Echocardiogram has been performed.  Delcie Roch 07/26/2016, 4:45 PM

## 2016-07-26 NOTE — Progress Notes (Signed)
OT Cancellation Note  Patient Details Name: ALESHKA CORNEY MRN: [id number] DOB: 07-28-1955   Cancelled Treatment:    Reason Eval/Treat Not Completed: Other (comment). Pt with active bedrest orders. Please update activity orders when appropriate for therapy. Thanks.  [facility] Denney Shein, OT/L  [phone] 07/26/2016 07/26/2016, 7:24 AM

## 2016-07-26 NOTE — Evaluation (Signed)
Speech Language Pathology Evaluation Patient Details Name: Heidi Green MRN: [id number] DOB: Jul 24, 1955 Today's Date: 07/26/2016 Time: 9528-4132 SLP Time Calculation (min) (ACUTE ONLY): 21 min  Problem List:  Patient Active Problem List   Diagnosis Date Noted  . ICH (intracerebral hemorrhage) (HCC) 07/24/2016  . Class 2 obesity   . Acute encephalopathy 07/18/2016  . NSTEMI (non-ST elevated myocardial infarction) (HCC) 06/17/2012  . Chest pain 06/16/2012  . HTN (hypertension) 06/16/2012  . Dyslipidemia 06/16/2012  . Back pain 06/16/2012  . Tobacco abuse 06/16/2012   Past Medical History:  Past Medical History:  Diagnosis Date  . Arthritis    in back  . Back pain   . Chest pain 06/16/2012   negative cath with normal LV function  . Headache(784.0)   . Hypertension   . Kidney stones    Past Surgical History:  Past Surgical History:  Procedure Laterality Date  . ABDOMINAL HYSTERECTOMY    . BACK SURGERY    . LEFT HEART CATHETERIZATION WITH CORONARY ANGIOGRAM N/A 06/16/2012   Procedure: LEFT HEART CATHETERIZATION WITH CORONARY ANGIOGRAM;  Surgeon: Peter M Swaziland, MD;  Location: [facility] CATH LAB;  Service: Cardiovascular;  Laterality: N/A;  . LITHOTRIPSY    . TUBAL LIGATION     HPI:  Heidi Green is a 61 y.o. female with history of arthritis, chronic pain, HTN and kidney stones found down with R hemiparesis. CT showed a L basal ganglia hemorrhage.    Assessment / Plan / Recommendation Clinical Impression  Pt demosntrates severe dysarthria with less than 50% intelligibility at word level. Pt is able to repeat single words and engaged in some automatic speech tasks accurately, but was quite lethargic during assessment and did not respond verbally to all questions. She was able to follow 1 and 2 step commands with 100% accuracy but was less than 50% accurate with Y/N questions, suspect due again to arousal more that receptive language deficit. Recommend pt f/u with SLP acutely for speech  intelligibility and further diagnositc testing of cognition and language as arousal improves. Would beneift from CIR at d/c.     SLP Assessment  SLP Recommendation/Assessment: Patient needs continued Speech Lanaguage Pathology Services SLP Visit Diagnosis: Dysarthria and anarthria (R47.1)    Follow Up Recommendations  Inpatient Rehab    Frequency and Duration min 2x/week  2 weeks      SLP Evaluation Cognition  Overall Cognitive Status: Difficult to assess Arousal/Alertness: Lethargic Orientation Level: Oriented to person;Disoriented to place;Disoriented to time;Disoriented to situation Attention: Sustained;Focused Focused Attention: Appears intact Sustained Attention: Impaired Sustained Attention Impairment: Verbal basic;Functional basic       Comprehension  Auditory Comprehension Overall Auditory Comprehension: Impaired Yes/No Questions: Impaired Basic Biographical Questions: 51-75% accurate Basic Immediate Environment Questions: 0-24% accurate Commands: Within Functional Limits Conversation: Simple Interfering Components: Attention EffectiveTechniques: Visual/Gestural cues    Expression Expression Primary Mode of Expression: Nonverbal - gestures Verbal Expression Overall Verbal Expression: Impaired Initiation: Impaired Automatic Speech: Name;Counting Level of Generative/Spontaneous Verbalization: Word Repetition: No impairment Naming: Impairment   Oral / Motor  Oral Motor/Sensory Function Overall Oral Motor/Sensory Function: Severe impairment Facial ROM: Reduced right;Suspected CN VII (facial) dysfunction Facial Symmetry: Abnormal symmetry right;Suspected CN VII (facial) dysfunction Facial Strength: Reduced right;Suspected CN VII (facial) dysfunction Facial Sensation: Reduced right;Suspected CN V (Trigeminal) dysfunction Lingual ROM: Reduced right;Suspected CN XII (hypoglossal) dysfunction Lingual Symmetry: Abnormal symmetry right;Suspected CN XII (hypoglossal)  dysfunction Lingual Strength: Reduced;Suspected CN XII (hypoglossal) dysfunction Motor Speech Overall Motor Speech: Impaired Respiration: Within  functional limits Phonation: Low vocal intensity Resonance: Hypernasality Articulation: Impaired Level of Impairment: Word Intelligibility: Intelligibility reduced Word: 25-49% accurate Phrase: Not tested Interfering Components: Inadequate dentition   GO                   Harlon Ditty, MA CCC-SLP [phone]  Claudine Mouton 07/26/2016, 2:42 PM

## 2016-07-26 NOTE — Progress Notes (Signed)
PT Cancellation Note  Patient Details Name: Heidi Green MRN: [id number] DOB: 08-10-1955   Cancelled Treatment:    Reason Eval/Treat Not Completed: Patient not medically ready, on bed rest orders. Will follow-up with PT evaluation when medically appropriate as time allows.  Dewayne Hatch, SPT Office-[phone]  Ina Homes 07/26/2016, 7:02 AM

## 2016-07-27 ENCOUNTER — Inpatient Hospital Stay (HOSPITAL_COMMUNITY): Payer: Medicare HMO

## 2016-07-27 DIAGNOSIS — E785 Hyperlipidemia, unspecified: Secondary | ICD-10-CM

## 2016-07-27 LAB — BASIC METABOLIC PANEL
Anion gap: 13 (ref 5–15)
BUN: 19 mg/dL (ref 6–20)
CALCIUM: 9.1 mg/dL (ref 8.9–10.3)
CHLORIDE: 106 mmol/L (ref 101–111)
CO2: 21 mmol/L — ABNORMAL LOW (ref 22–32)
CREATININE: 0.78 mg/dL (ref 0.44–1.00)
GFR calc non Af Amer: >60 mL/min (ref >60)
Glucose, Bld: 82 mg/dL (ref 65–99)
Potassium: 3.4 mmol/L — ABNORMAL LOW (ref 3.5–5.1)
Sodium: 140 mmol/L (ref 135–145)

## 2016-07-27 LAB — CBC
HCT: 40.3 % (ref 36.0–46.0)
Hemoglobin: 13.1 g/dL (ref 12.0–15.0)
MCH: 28.7 pg (ref 26.0–34.0)
MCHC: 32.5 g/dL (ref 30.0–36.0)
MCV: 88.2 fL (ref 78.0–100.0)
PLATELETS: 226 10*3/uL (ref 150–400)
RBC: 4.57 MIL/uL (ref 3.87–5.11)
RDW: 14.7 % (ref 11.5–15.5)
WBC: 11.3 10*3/uL — ABNORMAL HIGH (ref 4.0–10.5)

## 2016-07-27 LAB — GLUCOSE, CAPILLARY
GLUCOSE-CAPILLARY: 104 mg/dL — AB (ref 65–99)
Glucose-Capillary: 104 mg/dL — ABNORMAL HIGH (ref 65–99)

## 2016-07-27 LAB — MAGNESIUM
MAGNESIUM: 2 mg/dL (ref 1.7–2.4)
MAGNESIUM: 1.9 mg/dL (ref 1.7–2.4)

## 2016-07-27 LAB — PHOSPHORUS
Phosphorus: 3.8 mg/dL (ref 2.5–4.6)
Phosphorus: 3.8 mg/dL (ref 2.5–4.6)

## 2016-07-27 MED ORDER — LISINOPRIL 10 MG PO TABS
10.0000 mg | ORAL_TABLET | Freq: Every day | ORAL | Status: DC
  Administered 2016-07-27 – 2016-08-01 (×3): 10 mg via ORAL
  Filled 2016-07-27 (×3): qty 1

## 2016-07-27 MED ORDER — LISINOPRIL 20 MG PO TABS: 20.0000 mg | ORAL_TABLET | Freq: Every day | ORAL | Status: DC

## 2016-07-27 MED ORDER — BACLOFEN 10 MG PO TABS
10.0000 mg | ORAL_TABLET | Freq: Two times a day (BID) | ORAL | Status: DC
  Administered 2016-07-27 – 2016-08-01 (×5): 10 mg via ORAL
  Filled 2016-07-27 (×5): qty 1

## 2016-07-27 MED ORDER — VITAL HIGH PROTEIN PO LIQD
1000.0000 mL | ORAL | Status: DC
  Administered 2016-07-27 – 2016-07-29 (×2): 1000 mL
  Filled 2016-07-27 (×6): qty 1000

## 2016-07-27 MED ORDER — HYDROCODONE-ACETAMINOPHEN 5-325 MG PO TABS
1.0000 | ORAL_TABLET | Freq: Four times a day (QID) | ORAL | Status: DC | PRN
  Administered 2016-07-27 – 2016-07-31 (×6): 1 via ORAL
  Filled 2016-07-27 (×7): qty 1

## 2016-07-27 MED ORDER — FLUTICASONE PROPIONATE 50 MCG/ACT NA SUSP
2.0000 | Freq: Every day | NASAL | Status: DC
  Administered 2016-07-31 – 2016-08-01 (×2): 2 via NASAL
  Filled 2016-07-27: qty 16

## 2016-07-27 MED ORDER — HEPARIN SODIUM (PORCINE) 5000 UNIT/ML IJ SOLN
5000.0000 [IU] | Freq: Three times a day (TID) | INTRAMUSCULAR | Status: DC
  Administered 2016-07-27 – 2016-08-01 (×16): 5000 [IU] via SUBCUTANEOUS
  Filled 2016-07-27 (×16): qty 1

## 2016-07-27 MED ORDER — HYDROCODONE-ACETAMINOPHEN 5-325 MG PO TABS: 1.0000 | ORAL_TABLET | Freq: Three times a day (TID) | ORAL | Status: DC

## 2016-07-27 MED ORDER — ONDANSETRON HCL 4 MG/2ML IJ SOLN
4.0000 mg | INTRAMUSCULAR | Status: DC | PRN
  Administered 2016-07-27: 4 mg via INTRAVENOUS
  Filled 2016-07-27: qty 2

## 2016-07-27 MED ORDER — ROPINIROLE HCL 1 MG PO TABS
2.0000 mg | ORAL_TABLET | Freq: Two times a day (BID) | ORAL | Status: DC
  Administered 2016-07-27 – 2016-08-01 (×6): 2 mg via ORAL
  Filled 2016-07-27 (×11): qty 2

## 2016-07-27 MED ORDER — VITAL HIGH PROTEIN PO LIQD: 1000.0000 mL | ORAL | Status: DC

## 2016-07-27 MED ORDER — PRO-STAT SUGAR FREE PO LIQD
30.0000 mL | Freq: Two times a day (BID) | ORAL | Status: DC
  Filled 2016-07-27: qty 30

## 2016-07-27 MED ORDER — LORATADINE 10 MG PO TABS
10.0000 mg | ORAL_TABLET | Freq: Every day | ORAL | Status: DC
  Administered 2016-07-27 – 2016-08-01 (×3): 10 mg via ORAL
  Filled 2016-07-27 (×3): qty 1

## 2016-07-27 MED ORDER — LISINOPRIL 2.5 MG PO TABS: 2.5000 mg | ORAL_TABLET | Freq: Every day | ORAL | Status: DC

## 2016-07-27 NOTE — Progress Notes (Signed)
STROKE TEAM PROGRESS NOTE   SUBJECTIVE (INTERVAL HISTORY) Her RN is at bedside. Patient is awake alert, answer some questions but severe dysarthria. Following commands. Still has right UE plegia. Right LE much improved. Will insert NG and start tube feeding.     OBJECTIVE Temp:  [98.1 F (36.7 C)-100.3 F (37.9 C)] 98.1 F (36.7 C) (04/14 0400) Pulse Rate:  [66-100] 66 (04/14 0700) Cardiac Rhythm: Normal sinus rhythm (04/13 2000) Resp:  [15-30] 18 (04/14 0700) BP: (102-149)/(59-128) 107/65 (04/14 0700) SpO2:  [90 %-99 %] 95 % (04/14 0700)  CBC:   Recent Labs Lab 07/24/16 1619  07/26/16 0338 07/27/16 0249  WBC 16.2*  < > 12.7* 11.3*  NEUTROABS 13.4*  --   --   --   HGB 14.5  < > 13.8 13.1  HCT 44.5  < > 42.3 40.3  MCV 88.3  < > 87.9 88.2  PLT 239  < > 271 226  < > = values in this interval not displayed.  Basic Metabolic Panel:   Recent Labs Lab 07/26/16 0338 07/27/16 0249  NA 141 140  K 3.4* 3.4*  CL 107 106  CO2 22 21*  GLUCOSE 90 82  BUN 10 19  CREATININE 0.69 0.78  CALCIUM 9.0 9.1    Lipid Panel:     Component Value Date/Time   CHOL 195 07/25/2016 0226   TRIG 107 07/25/2016 0226   HDL 65 07/25/2016 0226   CHOLHDL 3.0 07/25/2016 0226   VLDL 21 07/25/2016 0226   LDLCALC 109 (H) 07/25/2016 0226   HgbA1c:  Lab Results  Component Value Date   HGBA1C 5.2 07/25/2016   Urine Drug Screen:     Component Value Date/Time   LABOPIA POSITIVE (A) 07/26/2016 1007   COCAINSCRNUR NONE DETECTED 07/26/2016 1007   LABBENZ NONE DETECTED 07/26/2016 1007   AMPHETMU NONE DETECTED 07/26/2016 1007   THCU NONE DETECTED 07/26/2016 1007   LABBARB NONE DETECTED 07/26/2016 1007    Alcohol Level No results found for: ETH  IMAGING I have personally reviewed the radiological images below and agree with the radiology interpretations.  Ct Head Code Stroke Wo Contrast 07/24/2016 1616  1. Intraparenchymal hemorrhage in the left basal ganglia measuring 4.8 x 2.7 x 3.5 cm,  volume 24 cc. Mild surrounding edema. Mild mass effect with left-to-right shift of 2 mm. Old right frontal infarction.   Ct Head Wo Contrast 07/25/2016 0538 1. Motion degraded examination.  2. Unchanged size of intraparenchymal hematoma centered in the left basal ganglia with unchanged rightward midline shift measuring 3 mm.   Ct Cervical Spine Wo Contrast 07/24/2016 Mild degenerative change without acute abnormality.   TTE - Left ventricle: The cavity size was normal. There was mild   concentric hypertrophy. Systolic function was normal. The   estimated ejection fraction was in the range of 55% to 60%. The   study is not technically sufficient to allow evaluation of LV   diastolic function. Impressions: - Limited, poor technical quality study. It should be repeated when   the patient is able to cooperate.  Ct Head Wo Contrast 07/26/2016 IMPRESSION: 1. Unchanged size of left basal ganglia centered intraparenchymal hematoma. 2. Slightly increased mass effect with midline shift now measuring 4 mm to the right. 3. No new area of hemorrhage.  No hydrocephalus.     PHYSICAL EXAM  Temp:  [98.1 F (36.7 C)-100.3 F (37.9 C)] 98.1 F (36.7 C) (04/14 0400) Pulse Rate:  [66-100] 66 (04/14 0700) Resp:  [15-30] 18 (  04/14 0700) BP: (102-149)/(59-128) 107/65 (04/14 0700) SpO2:  [90 %-99 %] 95 % (04/14 0700)  General - Well nourished, well developed, in mild LBP  Ophthalmologic - Fundi not visualized due to noncooperation.  Cardiovascular - Regular rate and rhythm.  Neuro - awake alert. Orientated to place, year and people, but not age and month. Able to following central and peripheral simple commands. Eyes attending to both sides, but not blinking to visual threat on the right, PERRL, right facial droop, tongue protrusion to the right. LUE 5/5, RUE minor withdrawal on pain stimulation. BLE 3/5 on pain. Sensation, coordination not cooperative, and gait not tested.   ASSESSMENT/PLAN Ms.  Heidi Green is a 61 y.o. female with history of arthritis, chronic pain, HTN and kidney stones found down with R hemiparesis. CT showed a L basal ganglia hemorrhage.   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 Mild edema w/ 2mm L to R shift. 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  CT repeat am unchanged hemorrhage, shift now 3mm  CT repeat 07/26/16 no change of ICH and midline shift  CTA canceled, not tolerating, consider once stable  MRI cancelled, not tolerating  2D Echo EF 55-60%  LDL 109  HgbA1c 5.2  Heparin subq for VTE prophylaxis Diet NPO time specified  No antithrombotic prior to admission  Ongoing aggressive stroke risk factor management  Therapy recommendations:  pending  Disposition:  pending   Hypertensive emergency  Blood pressure as high as 181/104, 205/85 in setting of neurologic symptoms   off Cleviprex   SBP goal < 140  Will start po meds once pass swallow  Hyperlipidemia  Home meds:  No statin.   LDL 109  No need to initiate statin at this time  Dysphagia  Did not pass swallow  Nothing by mouth  Speech following  insert cortrak today for TF and medication  Other Stroke Risk Factors  Former Cigarette smoker , quit 4 years ago   Urine drug screen positive for opiates and benzos  Obesity, Body mass index is 37.47 kg/m., recommend weight loss, diet and exercise as appropriate   Other Active Problems  Leukocytosis, WBC 17.0->12.7->11.3  LBP - morphine PRN  Hospital day # 3  This patient is critically ill due to left BG hemorrhage, hypertensive emergency, dysphagia and at significant risk of neurological worsening, death form hematoma extension, heart failure, seizure, aspiration pneumonia. This patient's care requires constant monitoring of vital signs, hemodynamics, respiratory and cardiac monitoring, review of multiple databases, neurological  assessment, discussion with family, other specialists and medical decision making of high complexity. I spent 35 minutes of neurocritical care time in the care of this patient.   Marvel Plan, MD PhD Stroke Neurology 07/27/2016 9:50 AM   To contact Stroke Continuity provider, please refer to [email or web address]. After hours, contact General Neurology

## 2016-07-27 NOTE — Progress Notes (Signed)
  Speech Language Pathology Treatment: Dysphagia  Patient Details Name: Heidi Green MRN: [id number] DOB: 26-Sep-1955 Today's Date: 07/27/2016 Time: 1004-1020 SLP Time Calculation (min) (ACUTE ONLY): 16 min  Assessment / Plan / Recommendation Clinical Impression  RN paged stating pt crying and wanting to know her swallowing status prior to cortrak placement. Seen for dysphagia treatment. Pt with improved alertness today, however she is in severe pain limiting her ability to tolerate optimal positioning for PO trials. Eventually she was able to tolerate head of bed elevation to ~75 degrees. Immediate coughing with ice chips, thin liquids via teaspoon. Delayed cough, wet vocal quality with nectar-thick liquid, puree. Provided education to pt and her husband that diet initiation not recommended without instrumental evaluation. Pt has difficulty participating in conversation due to severe pain. RN informed, and cortrak team present for placement to deliver medications. Given patient's severe pain and postural limitations, she will not likely tolerate positioning sufficient for MBS at this time. Provided education to patient and her husband re: instrumental assessments. Will f/u with RN tomorrow re: pain and to determine if she is ready for instrumental assessment. FEES may be a better option for this patient, which would be performed Monday. Patient, husband and RN in agreement with this plan.      HPI HPI: Heidi Green is a 61 y.o. female with history of arthritis, chronic pain, HTN and kidney stones found down with R hemiparesis. CT showed a L basal ganglia hemorrhage.       SLP Plan  New goals to be determined pending instrumental study;Continue with current plan of care       Recommendations  Diet recommendations: NPO Medication Administration: Via alternative means                Oral Care Recommendations: Oral care QID Follow up Recommendations: Inpatient Rehab SLP Visit Diagnosis:  Dysphagia, oropharyngeal phase (R13.12) Plan: New goals to be determined pending instrumental study;Continue with current plan of care       GO               Rondel Baton, MS CF-SLP Speech-Language Pathologist [phone]   Arlana Lindau 07/27/2016, 11:52 AM

## 2016-07-27 NOTE — Progress Notes (Signed)
Initial Nutrition Assessment  DOCUMENTATION CODES:  Obesity unspecified  INTERVENTION:  Cortrak Placed. Imaging pending  Initiate TF via NGT  with Vital High Protein at goal rate of 60 ml/h (1440 ml per day) and to provide 1440 kcals, 126 gm protein, 1204 ml free water daily.  NUTRITION DIAGNOSIS:  Inadequate oral intake related to inability to eat (ICH) as evidenced by NPO status (SLP eval/need for npo status).  GOAL:  Provide needs based on ASPEN/SCCM guidelines  MONITOR:  Diet advancement, Vent status, Labs, Weight trends, TF tolerance  REASON FOR ASSESSMENT:  Consult Enteral/tube feeding initiation and management  ASSESSMENT:  61 y/o female PMHx HTN. Found on floor of garage with AMS. Evaluated and found to have L ICH.   RD consulted for cortrak w/ TF orders  Pt aphasic/dysarthrotic. On RD arrival, ST working with pt/husband, explaining need for NPO status. Planned to have MBS tomorrow or FEES on Monday, however Pt crying out, asking for Requip and other meds. She was agreeable to NGT placement for These.    Patient was at baseline prior to event. Has gone 72 hrs w/o nutrition. Had hx of wt gain prior.   Pt was in very audible discomfort during placement, crying out. As such, did not pursue postpyloric access. Gastric access achieved  Physical Exam: wdl  Labs: Reviewed lipid panel results  Medications: PPI, Requip, Senna, Cleviprex D/Cd. Morphine, NS   Recent Labs Lab 07/25/16 0226 07/26/16 0338 07/27/16 0249 07/27/16 1024  NA 140 141 140  --   K 3.4* 3.4* 3.4*  --   CL 106 107 106  --   CO2 23 22 21*  --   BUN --   CREATININE 0.67 0.69 0.78  --   CALCIUM 9.3 9.0 9.1  --   MG  --   --   --  2.0  PHOS  --   --   --  3.8  GLUCOSE 109* 90 82  --    Diet Order:  Diet NPO time specified  Skin:Abrasion to L/R leg,   Last BM:  Unknown  Height:  Ht Readings from Last 1 Encounters:  07/24/16  (1.676 m)   Weight:  Wt Readings from Last 1  Encounters:  07/24/16 232 lb 2.3 oz (105.3 kg)   Wt Readings from Last 10 Encounters:  07/24/16 232 lb 2.3 oz (105.3 kg)  07/18/16 228 lb 2.8 oz (103.5 kg)  03/10/16 220 lb (99.8 kg)  11/12/12 152 lb 1.9 oz (69 kg)  07/08/12 156 lb (70.8 kg)  06/17/12 160 lb 11.5 oz (72.9 kg)   Ideal Body Weight:  59.1 kg  BMI:  Body mass index is 37.47 kg/m.  Estimated Nutritional Needs:  Kcal:  1150-1475 kcals (11-14 kcal/kg ibw) Protein:  >118 g (2 g/kg ibw) Fluid:  >1.5 Liter  EDUCATION NEEDS:  No education needs identified at this time  Christophe Louis RD, LDN, CNSC Clinical Nutrition Pager: [phone] 07/27/2016 11:23 AM

## 2016-07-28 ENCOUNTER — Inpatient Hospital Stay (HOSPITAL_COMMUNITY): Payer: Medicare HMO

## 2016-07-28 LAB — CBC
HCT: 38.8 % (ref 36.0–46.0)
Hemoglobin: 12.9 g/dL (ref 12.0–15.0)
MCH: 29 pg (ref 26.0–34.0)
MCHC: 33.2 g/dL (ref 30.0–36.0)
MCV: 87.2 fL (ref 78.0–100.0)
PLATELETS: 237 10*3/uL (ref 150–400)
RBC: 4.45 MIL/uL (ref 3.87–5.11)
RDW: 14.3 % (ref 11.5–15.5)
WBC: 9.9 10*3/uL (ref 4.0–10.5)

## 2016-07-28 LAB — GLUCOSE, CAPILLARY
GLUCOSE-CAPILLARY: 86 mg/dL (ref 65–99)
GLUCOSE-CAPILLARY: 95 mg/dL (ref 65–99)
GLUCOSE-CAPILLARY: 96 mg/dL (ref 65–99)
GLUCOSE-CAPILLARY: 79 mg/dL (ref 65–99)
Glucose-Capillary: 99 mg/dL (ref 65–99)
Glucose-Capillary: 96 mg/dL (ref 65–99)

## 2016-07-28 LAB — BASIC METABOLIC PANEL
Anion gap: 8 (ref 5–15)
BUN: 23 mg/dL — AB (ref 6–20)
CALCIUM: 8.8 mg/dL — AB (ref 8.9–10.3)
CO2: 23 mmol/L (ref 22–32)
CREATININE: 0.69 mg/dL (ref 0.44–1.00)
Chloride: 108 mmol/L (ref 101–111)
GFR calc Af Amer: >60 mL/min (ref >60)
GLUCOSE: 105 mg/dL — AB (ref 65–99)
Potassium: 3.5 mmol/L (ref 3.5–5.1)
Sodium: 139 mmol/L (ref 135–145)

## 2016-07-28 LAB — MAGNESIUM
Magnesium: 2 mg/dL (ref 1.7–2.4)
Magnesium: 2 mg/dL (ref 1.7–2.4)

## 2016-07-28 LAB — PHOSPHORUS
Phosphorus: 3.8 mg/dL (ref 2.5–4.6)
Phosphorus: 3.4 mg/dL (ref 2.5–4.6)

## 2016-07-28 MED ORDER — HYDRALAZINE HCL 20 MG/ML IJ SOLN
INTRAMUSCULAR | Status: AC
  Filled 2016-07-28: qty 1

## 2016-07-28 MED ORDER — POTASSIUM CHLORIDE CRYS ER 10 MEQ PO TBCR: 10.0000 meq | EXTENDED_RELEASE_TABLET | Freq: Two times a day (BID) | ORAL | Status: DC

## 2016-07-28 MED ORDER — MORPHINE SULFATE (PF) 2 MG/ML IV SOLN
1.0000 mg | INTRAVENOUS | Status: DC | PRN
  Administered 2016-07-28 – 2016-07-31 (×14): 1 mg via INTRAVENOUS
  Filled 2016-07-28 (×17): qty 1

## 2016-07-28 MED ORDER — LORAZEPAM 2 MG/ML IJ SOLN
INTRAMUSCULAR | Status: AC
  Filled 2016-07-28: qty 1

## 2016-07-28 MED ORDER — HYDRALAZINE HCL 20 MG/ML IJ SOLN
5.0000 mg | Freq: Once | INTRAMUSCULAR | Status: AC
  Administered 2016-07-28: 5 mg via INTRAVENOUS

## 2016-07-28 MED ORDER — LORAZEPAM 2 MG/ML IJ SOLN
1.0000 mg | Freq: Once | INTRAMUSCULAR | Status: AC
  Administered 2016-07-28: 1 mg via INTRAVENOUS

## 2016-07-28 NOTE — Progress Notes (Signed)
Pt observed touching face.  NG tube pulled approx 10 cm out from tape marking.  Tube feeding stopped.

## 2016-07-28 NOTE — Progress Notes (Signed)
  Speech Language Pathology Treatment: Dysphagia  Patient Details Name: Heidi Green MRN: [id number] DOB: [birth year] Today's Date: 07/28/2016 Time: 7829-5621 SLP Time Calculation (min) (ACUTE ONLY): 13 min  Assessment / Plan / Recommendation Clinical Impression  Patient seen for dysphagia treatment. RN reports pt pulled cortrak 10 cm from tape marking; tube feedings have been stopped. Pt remains in consistent, chronic pain which limits positioning. Given these limitations, it is unlikely that she will tolerate MBS at this time. Provided upgraded trials including ice chips, thin liquids, pureed solids. Pt with appearance of improved swallow timing today, though she continues to have right sided bolus loss which is exacerbated by right leaning posture. No overt signs of aspiration with ice chips, single sips of thin liquids, or teaspoon puree, though she does require mod-max cuing to hold her head upright to minimize bolus loss, cue for lingual sweep to clear pureed residue. Given deficits, do not recommend diet initiation without instrumental examination at pt remains at severe risk for aspiration. Patient may have ice chips with full supervision after oral care, and necessary medications can be given crushed in puree (uncrushable meds OK whole, one at a time in puree). Monitor for pocketing and anterior loss. Recommend FEES to be performed Monday to better visualize pt's swallowing function objectively given her postural limitations. SLP will f/u.    HPI HPI: Ms. JAMILLAH CAMILO is a 61 y.o. female with history of arthritis, chronic pain, HTN and kidney stones found down with R hemiparesis. CT showed a L basal ganglia hemorrhage.       SLP Plan  New goals to be determined pending instrumental study;Continue with current plan of care       Recommendations  Diet recommendations: NPO;Other(comment) (Ice chips after oral care) Medication Administration: Other (Comment) (necessary medications in  puree) Supervision: Full supervision/cueing for compensatory strategies;Trained caregiver to feed patient Compensations: Slow rate;Small sips/bites;Lingual sweep for clearance of pocketing;Monitor for anterior loss Postural Changes and/or Swallow Maneuvers: Seated upright 90 degrees                Oral Care Recommendations: Oral care QID Follow up Recommendations: Inpatient Rehab SLP Visit Diagnosis: Dysphagia, oropharyngeal phase (R13.12) Plan: New goals to be determined pending instrumental study;Continue with current plan of care       GO               Rondel Baton, MS CF-SLP Speech-Language Pathologist [phone]  Arlana Lindau 07/28/2016, 9:59 AM

## 2016-07-28 NOTE — Progress Notes (Signed)
STROKE TEAM PROGRESS NOTE   SUBJECTIVE (INTERVAL HISTORY) Her husband is at bedside. Patient is awake alert, answer some questions but severe dysarthria. Following commands. Still has right UE plegia.  She is complaining of severe back pain and restless legs. She takes hydrocodone at home   OBJECTIVE Temp:  [97.6 F (36.4 C)-98.4 F (36.9 C)] 98.4 F (36.9 C) (04/15 0808) Pulse Rate:  [64-94] 70 (04/15 0800) Cardiac Rhythm: Normal sinus rhythm (04/15 0800) Resp:  [13-24] 17 (04/15 0800) BP: (108-164)/(65-100) 122/86 (04/15 0800) SpO2:  [89 %-99 %] 93 % (04/15 0800) Weight:  [103.7 kg (228 lb 9.9 oz)] 103.7 kg (228 lb 9.9 oz) (04/14 1400)  CBC:   Recent Labs Lab 07/24/16 1619  07/27/16 0249 07/28/16 0458  WBC 16.2*  < > 11.3* 9.9  NEUTROABS 13.4*  --   --   --   HGB 14.5  < > 13.1 12.9  HCT 44.5  < > 40.3 38.8  MCV 88.3  < > 88.2 87.2  PLT 239  < > 226 237  < > = values in this interval not displayed.  Basic Metabolic Panel:   Recent Labs Lab 07/27/16 0249  07/27/16 1622 07/28/16 0458  NA 140  --   --  139  K 3.4*  --   --  3.5  CL 106  --   --  108  CO2 21*  --   --  23  GLUCOSE 82  --   --  105*  BUN 19  --   --  23*  CREATININE 0.78  --   --  0.69  CALCIUM 9.1  --   --  8.8*  MG  --   < > 1.9 2.0  PHOS  --   < > 3.8 3.8  < > = values in this interval not displayed.  Lipid Panel:     Component Value Date/Time   CHOL 195 07/25/2016 0226   TRIG 107 07/25/2016 0226   HDL 65 07/25/2016 0226   CHOLHDL 3.0 07/25/2016 0226   VLDL 21 07/25/2016 0226   LDLCALC 109 (H) 07/25/2016 0226   HgbA1c:  Lab Results  Component Value Date   HGBA1C 5.2 07/25/2016   Urine Drug Screen:     Component Value Date/Time   LABOPIA POSITIVE (A) 07/26/2016 1007   COCAINSCRNUR NONE DETECTED 07/26/2016 1007   LABBENZ NONE DETECTED 07/26/2016 1007   AMPHETMU NONE DETECTED 07/26/2016 1007   THCU NONE DETECTED 07/26/2016 1007   LABBARB NONE DETECTED 07/26/2016 1007     Alcohol Level No results found for: ETH    IMAGING I have personally reviewed the radiological images below and agree with the radiology interpretations.  Ct Head Code Stroke Wo Contrast 07/24/2016 1616 Intraparenchymal hemorrhage in the left basal ganglia measuring 4.8 x 2.7 x 3.5 cm, volume 24 cc.  Mild surrounding edema. Mild mass effect with left-to-right shift of 2 mm. Old right frontal infarction.    Ct Head Wo Contrast 07/25/2016 0538 1. Motion degraded examination.  2. Unchanged size of intraparenchymal hematoma centered in the left basal ganglia with unchanged rightward midline shift measuring 3 mm.    Ct Cervical Spine Wo Contrast 07/24/2016 Mild degenerative change without acute abnormality.    TTE  07/26/2016 Left ventricle: The cavity size was normal. There was mild   concentric hypertrophy. Systolic function was normal. The   estimated ejection fraction was in the range of 55% to 60%.  The study is not technically sufficient to  allow evaluation of LV diastolic function. Impressions: - Limited, poor technical quality study. It should be repeated when the patient is able to cooperate.   Ct Head Wo Contrast 07/26/2016 1. Unchanged size of left basal ganglia centered intraparenchymal hematoma.  2. Slightly increased mass effect with midline shift now measuring 4 mm to the right.  3. No new area of hemorrhage.  No hydrocephalus.     PHYSICAL EXAM  Temp:  [97.6 F (36.4 C)-98.4 F (36.9 C)] 98.4 F (36.9 C) (04/15 0808) Pulse Rate:  [64-94] 70 (04/15 0800) Resp:  [13-24] 17 (04/15 0800) BP: (108-164)/(65-100) 122/86 (04/15 0800) SpO2:  [89 %-99 %] 93 % (04/15 0800) Weight:  [103.7 kg (228 lb 9.9 oz)] 103.7 kg (228 lb 9.9 oz) (04/14 1400)  General - Well nourished, well developed, mild distress due to back pain and restless legs  Ophthalmologic - Fundi not visualized due to noncooperation.  Cardiovascular - Regular rate and rhythm.  Neuro - awake alert.  Orientated to place, year and people, but not age and month.dysrthric speech. Able to following central and peripheral simple commands. Eyes attending to both sides, but not blinking to visual threat on the right, PERRL, right facial droop, tongue protrusion to the right. LUE 5/5, RUE minor withdrawal on pain stimulation. BLE 3/5 on pain. Sensation, coordination not cooperative, and gait not tested.   ASSESSMENT/PLAN Ms. SAMHITHA ROSEN is a 61 y.o. female with history of arthritis, chronic pain, HTN and kidney stones found down with R hemiparesis. CT showed a L basal ganglia hemorrhage.   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 Mild edema w/ 2mm L to R shift. 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  LDL 109  HgbA1c 5.2  Heparin subq for VTE prophylaxis Diet NPO time specified  No antithrombotic prior to admission  Ongoing aggressive stroke risk factor management  Therapy recommendations:  Rehab Admissions Coordinator following. Not yet ready for MD consult.  Disposition:  pending   Hypertensive emergency  Blood pressure as high as 181/104, 205/85 in setting of neurologic symptoms   off Cleviprex   SBP goal < 140  Now on Lisinopril per tube and prn Labetalol.  BP 122/86  Hyperlipidemia  Home meds:  No statin.   LDL 109  No need to initiate statin at this time  Dysphagia  Did not pass swallow - Continue NPO per Speech Therapy 07/27/2016.  Nothing by mouth  Speech following  Now with Cortrak for TF and medication  Other Stroke Risk Factors  Former Cigarette smoker , quit 4 years ago   Urine drug screen  positive for opiates and benzos  Obesity, Body mass index is 36.9 kg/m., recommend weight loss, diet and exercise as appropriate   Other Active Problems  Leukocytosis, WBC 17.0->12.7->11.3 -> 9.9  LBP - morphine PRN  Hospital day # 4 Plan MRI brain and MRA brain with morphine for pain/sedation.Counselled patient to be cooperative and she and husband agree.Replace potassium This patient is critically ill due to left BG hemorrhage, hypertensive emergency, dysphagia and at significant risk of neurological worsening, death form hematoma extension, heart failure, seizure,  aspiration pneumonia. This patient's care requires constant monitoring of vital signs, hemodynamics, respiratory and cardiac monitoring, review of multiple databases, neurological assessment, discussion with family, other specialists and medical decision making of high complexity. I spent 30 minutes of neurocritical care time in the care of this patient.  Delia Heady, MD Medical Director [facility] Pager: [phone] 07/28/2016 1:17 PM   To contact Stroke Continuity provider, please refer to [email or web address]. After hours, contact General Neurology

## 2016-07-28 NOTE — Evaluation (Signed)
Occupational Therapy Evaluation Patient Details Name: JONNE ROTE MRN: [id number] DOB: 1956/03/23 Today's Date: 07/28/2016    History of Present Illness Pt is a 61 y.o. female admitted to ED on 07/24/16 after being found down with altered mental status; CT showed L basal ganglia centered intraparenchymal hematoma. Pertinent PMH include HTN, chronic severe back pain, arthritis.    Clinical Impression   This 61 yo female admitted with above presents to acute OT with deficits below (see OT problem list) thus affecting her PLOF of being Mod I with basic ADLs. She will benefit from acute OT with follow up on CIR to work back towards PLOF and decrease burden of care on family.    Follow Up Recommendations  CIR;Supervision/Assistance - 24 hour    Equipment Recommendations  Other (comment) (TBD at next venue)    Recommendations for Other Services Rehab consult     Precautions / Restrictions Precautions Precautions: Fall Restrictions Weight Bearing Restrictions: No      Mobility Bed Mobility Overal bed mobility: Needs Assistance Bed Mobility: Supine to Sit;Sit to Supine     Supine to sit: +2 for physical assistance;Total assist Sit to supine: Total assist;+2 for physical assistance         Balance Overall balance assessment: Needs assistance Sitting-balance support: Single extremity supported;Feet supported Sitting balance-Leahy Scale: Zero Sitting balance - Comments: Pt total A to maintain sitting with pt pushing with LUE to her right                                   ADL either performed or assessed with clinical judgement   ADL Overall ADL's : Needs assistance/impaired                                       General ADL Comments: Currently total A at bed level     Vision Patient Visual Report: No change from baseline Additional Comments: Pt reports she does not see double and she can reach out and touch my hand wherever I place it.            Pertinent Vitals/Pain Pain Assessment: Faces Faces Pain Scale: Hurts whole lot Pain Location: back Pain Descriptors / Indicators: Aching;Grimacing;Guarding;Moaning Pain Intervention(s): Limited activity within patient's tolerance;Monitored during session;Repositioned;Premedicated before session     Hand Dominance Right   Extremity/Trunk Assessment Upper Extremity Assessment Upper Extremity Assessment: RUE deficits/detail RUE Deficits / Details: No AROM noted with RUE; no pain noted with PROM RUE Coordination: decreased gross motor;decreased fine motor           Communication Communication Communication: Expressive difficulties   Cognition Arousal/Alertness: Lethargic Behavior During Therapy: Flat affect Overall Cognitive Status: Impaired/Different from baseline Area of Impairment: Attention;Following commands;Safety/judgement                   Current Attention Level: Focused   Following Commands: Follows one step commands inconsistently Safety/Judgement: Decreased awareness of safety;Decreased awareness of deficits (pushing with LUE towards her weak side)                    Home Living Family/patient expects to be discharged to:: Inpatient rehab Living Arrangements: Spouse/significant other Available Help at Discharge: Family;Available PRN/intermittently (husband) Type of Home: House Home Access: Stairs to enter [employer] of Steps: 3 Entrance Stairs-Rails: Can reach both  Home Layout: Two level;Bed/bath upstairs Alternate Level Stairs-Number of Steps: 13 Alternate Level Stairs-Rails: Can reach both           Home Equipment: Walker - 4 wheels          Prior Functioning/Environment Level of Independence: Independent with assistive device(s)        Comments: Husband reports pt performs limited household amb mod indep with rollator secondary to chronic back pain; does not drive. Indep with ADLs. Mostly stays home. Pt used  riding mower to [employer] 1 week before CVA (husband emptied it)        OT Problem List: Decreased strength;Decreased range of motion;Impaired balance (sitting and/or standing);Decreased activity tolerance;Obesity;Impaired UE functional use;Decreased cognition;Pain (vision to be further assessed)      OT Treatment/Interventions: Self-care/ADL training;Therapeutic activities;Therapeutic exercise;Cognitive remediation/compensation;Neuromuscular education;Visual/perceptual remediation/compensation;DME and/or AE instruction;Patient/family education;Balance training    OT Goals(Current goals can be found in the care plan section) Acute Rehab OT Goals Patient Stated Goal: Unable to state goals OT Goal Formulation: Patient unable to participate in goal setting Time For Goal Achievement: 08/11/16 Potential to Achieve Goals: Fair  OT Frequency: Min 3X/week              End of Session Nurse Communication:  (RN with me assisting with up to EOB and back down to supine)  Activity Tolerance: Patient limited by pain Patient left: in bed;with call bell/phone within reach;with bed alarm set  OT Visit Diagnosis: Other symptoms and signs involving the nervous system (R29.898);Cognitive communication deficit (R41.841);Hemiplegia and hemiparesis Symptoms and signs involving cognitive functions: Nontraumatic intracerebral hemorrhage Hemiplegia - Right/Left: Right Hemiplegia - dominant/non-dominant: Dominant Hemiplegia - caused by: Nontraumatic intracerebral hemorrhage                Time: 1309-1330 OT Time Calculation (min): 21 min Charges:  OT General Charges $OT Visit: 1 Procedure OT Evaluation $OT Eval Moderate Complexity: 1 Procedure Ignacia Palma, OTR/L [phone] 07/28/2016

## 2016-07-29 ENCOUNTER — Inpatient Hospital Stay (HOSPITAL_COMMUNITY): Payer: Medicare HMO

## 2016-07-29 LAB — CBC
HEMATOCRIT: 47.8 % — AB (ref 36.0–46.0)
Hemoglobin: 15.7 g/dL — ABNORMAL HIGH (ref 12.0–15.0)
MCH: 28.5 pg (ref 26.0–34.0)
MCHC: 32.8 g/dL (ref 30.0–36.0)
MCV: 86.9 fL (ref 78.0–100.0)
Platelets: 197 10*3/uL (ref 150–400)
RBC: 5.5 MIL/uL — ABNORMAL HIGH (ref 3.87–5.11)
RDW: 14.2 % (ref 11.5–15.5)
WBC: 10.8 10*3/uL — ABNORMAL HIGH (ref 4.0–10.5)

## 2016-07-29 LAB — BASIC METABOLIC PANEL
Anion gap: 9 (ref 5–15)
BUN: 9 mg/dL (ref 6–20)
CALCIUM: 9.2 mg/dL (ref 8.9–10.3)
CO2: 25 mmol/L (ref 22–32)
CREATININE: 0.59 mg/dL (ref 0.44–1.00)
Chloride: 106 mmol/L (ref 101–111)
GFR calc non Af Amer: >60 mL/min (ref >60)
GLUCOSE: 92 mg/dL (ref 65–99)
Potassium: 2.9 mmol/L — ABNORMAL LOW (ref 3.5–5.1)
Sodium: 140 mmol/L (ref 135–145)

## 2016-07-29 LAB — GLUCOSE, CAPILLARY
GLUCOSE-CAPILLARY: 89 mg/dL (ref 65–99)
Glucose-Capillary: 94 mg/dL (ref 65–99)
Glucose-Capillary: 106 mg/dL — ABNORMAL HIGH (ref 65–99)
Glucose-Capillary: 86 mg/dL (ref 65–99)

## 2016-07-29 MED ORDER — POTASSIUM CHLORIDE 20 MEQ PO PACK
40.0000 meq | PACK | Freq: Three times a day (TID) | ORAL | Status: DC
  Administered 2016-07-29: 40 meq via ORAL
  Filled 2016-07-29 (×3): qty 2

## 2016-07-29 NOTE — Progress Notes (Signed)
STROKE TEAM PROGRESS NOTE   SUBJECTIVE (INTERVAL HISTORY) Not awake enough to cooperate with her MBS this am. Now with panda back in. TF not running. Ok to restart. Needs to place L hand mitt.MRI scan showed stable appearance of the basal ganglia hemorrhage with mild surrounding edema. No underlying tumor or mass lesion is noted   OBJECTIVE Temp:  [97.7 F (36.5 C)-98.7 F (37.1 C)] 98.5 F (36.9 C) (04/16 1213) Pulse Rate:  [57-92] 90 (04/16 1213) Cardiac Rhythm: Normal sinus rhythm (04/16 0750) Resp:  [7-27] 18 (04/16 1213) BP: (123-173)/(57-112) 143/80 (04/16 1213) SpO2:  [96 %-100 %] 100 % (04/16 1213) Weight:  [102.3 kg (225 lb 8.5 oz)] 102.3 kg (225 lb 8.5 oz) (04/16 0321)  CBC:   Recent Labs Lab 07/24/16 1619  07/28/16 0458 07/29/16 0231  WBC 16.2*  < > 9.9 10.8*  NEUTROABS 13.4*  --   --   --   HGB 14.5  < > 12.9 15.7*  HCT 44.5  < > 38.8 47.8*  MCV 88.3  < > 87.2 86.9  PLT 239  < > 237 197  < > = values in this interval not displayed.  Basic Metabolic Panel:   Recent Labs Lab 07/28/16 0458 07/28/16 1816 07/29/16 0231  NA 139  --  140  K 3.5  --  2.9*  CL 108  --  106  CO2 23  --  25  GLUCOSE 105*  --  92  BUN 23*  --  9  CREATININE 0.69  --  0.59  CALCIUM 8.8*  --  9.2  MG 2.0 2.0  --   PHOS 3.8 3.4  --     Lipid Panel:     Component Value Date/Time   CHOL 195 07/25/2016 0226   TRIG 107 07/25/2016 0226   HDL 65 07/25/2016 0226   CHOLHDL 3.0 07/25/2016 0226   VLDL 21 07/25/2016 0226   LDLCALC 109 (H) 07/25/2016 0226   HgbA1c:  Lab Results  Component Value Date   HGBA1C 5.2 07/25/2016   Urine Drug Screen:     Component Value Date/Time   LABOPIA POSITIVE (A) 07/26/2016 1007   COCAINSCRNUR NONE DETECTED 07/26/2016 1007   LABBENZ NONE DETECTED 07/26/2016 1007   AMPHETMU NONE DETECTED 07/26/2016 1007   THCU NONE DETECTED 07/26/2016 1007   LABBARB NONE DETECTED 07/26/2016 1007    Alcohol Level No results found for: [facility]    IMAGING Mr  Maxine Glenn Head Wo Contrast  Result Date: 07/28/2016 CLINICAL DATA:  61 year old female with left basal ganglia hemorrhage discovered on 07/24/2016 during evaluation of altered mental status. EXAM: MRI HEAD WITHOUT CONTRAST MRA HEAD WITHOUT CONTRAST TECHNIQUE: Multiplanar, multiecho pulse sequences of the brain and surrounding structures were obtained without intravenous contrast. Angiographic images of the head were obtained using MRA technique without contrast. COMPARISON:  Head CTs without contrast 07/26/2016 and earlier. FINDINGS: MRI HEAD FINDINGS Brain: Heterogeneous but mostly hyperintense T1 signal and dark T2 signal intra-axial hemorrhage centered at the left lentiform nuclei encompasses 53 x 25 x 39 mm (AP by transverse by CC), for an estimated blood volume of 26 mL. Size and configuration has not significantly changed since 07/24/2016. Surrounding edema. Regional mass effect with 45 mm of rightward midline shift. Mass effect on the left lateral ventricle, but no intraventricular extension of blood. No extra-axial extension identified. Basilar cisterns remain patent. No restricted diffusion to suggest acute infarction. No ventriculomegaly. Cervicomedullary junction and pituitary are within normal limits. In addition to the edema  in the left hemisphere there is patchy in widespread bilateral cerebral white matter T2 and FLAIR hyperintensity in a nonspecific configuration. No cortical encephalomalacia identified. No definite other parenchymal blood products. Brainstem and cerebellum are within normal limits. Vascular: Major intracranial vascular flow voids are preserved, 0 with mild generalized intracranial artery dolichoectasia. Skull and upper cervical spine: Negative. Visualized bone marrow signal is within normal limits. Sinuses/Orbits: Negative orbits soft tissues. Trace paranasal sinus mucosal thickening. Other: Mastoids are clear. Visible internal auditory structures appear normal. Negative scalp soft  tissues. MRA HEAD FINDINGS Antegrade flow in the posterior circulation with codominant distal vertebral arteries. Normal PICA origins. Tortuous but otherwise negative vertebrobasilar junction. Tortuous basilar artery without stenosis. Normal SCA and PCA origins. Posterior communicating arteries are diminutive or absent. Mildly tortuous left P1 segment. Bilateral PCA branches are within normal limits. Posterior communicating arteries are diminutive or absent. Antegrade flow in both ICA siphons. Tortuous distal cervical right ICA. No siphon stenosis. Normal ophthalmic artery origins. Patent carotid termini. MCA and ACA origins are within normal limits. Mildly dominant right ACA A1 segment. Anterior communicating artery within normal limits. Dominant right ACA A2 segment. This somewhat simulates an azygos ACA anatomy. Mildly tortuous bilateral MCA M1 segments. No M1 stenosis. Bilateral MCA bifurcations and visible bilateral MCA branches are within normal limits. IMPRESSION: 1. Acute intra-axial hemorrhage in the left lentiform with estimated blood volume of 26 mL has not significantly changed since 07/24/2016. Surrounding edema and regional mass effect including partial effacement of the left lateral ventricle, but no intraventricular hemorrhage, extra-axial hemorrhage, or ventriculomegaly. 2. Negative intracranial MRA aside from intracranial artery tortuosity. 3. Moderate to severe for age nonspecific cerebral white matter signal changes. Top differential considerations include chronic small vessel disease and chronic demyelinating disease. Electronically Signed   By: Odessa Fleming M.D.   On: 07/28/2016 19:50   Mr Brain Wo Contrast  Result Date: 07/28/2016 CLINICAL DATA:  61 year old female with left basal ganglia hemorrhage discovered on 07/24/2016 during evaluation of altered mental status. EXAM: MRI HEAD WITHOUT CONTRAST MRA HEAD WITHOUT CONTRAST TECHNIQUE: Multiplanar, multiecho pulse sequences of the brain and  surrounding structures were obtained without intravenous contrast. Angiographic images of the head were obtained using MRA technique without contrast. COMPARISON:  Head CTs without contrast 07/26/2016 and earlier. FINDINGS: MRI HEAD FINDINGS Brain: Heterogeneous but mostly hyperintense T1 signal and dark T2 signal intra-axial hemorrhage centered at the left lentiform nuclei encompasses 53 x 25 x 39 mm (AP by transverse by CC), for an estimated blood volume of 26 mL. Size and configuration has not significantly changed since 07/24/2016. Surrounding edema. Regional mass effect with 45 mm of rightward midline shift. Mass effect on the left lateral ventricle, but no intraventricular extension of blood. No extra-axial extension identified. Basilar cisterns remain patent. No restricted diffusion to suggest acute infarction. No ventriculomegaly. Cervicomedullary junction and pituitary are within normal limits. In addition to the edema in the left hemisphere there is patchy in widespread bilateral cerebral white matter T2 and FLAIR hyperintensity in a nonspecific configuration. No cortical encephalomalacia identified. No definite other parenchymal blood products. Brainstem and cerebellum are within normal limits. Vascular: Major intracranial vascular flow voids are preserved, 0 with mild generalized intracranial artery dolichoectasia. Skull and upper cervical spine: Negative. Visualized bone marrow signal is within normal limits. Sinuses/Orbits: Negative orbits soft tissues. Trace paranasal sinus mucosal thickening. Other: Mastoids are clear. Visible internal auditory structures appear normal. Negative scalp soft tissues. MRA HEAD FINDINGS Antegrade flow in the posterior circulation with  codominant distal vertebral arteries. Normal PICA origins. Tortuous but otherwise negative vertebrobasilar junction. Tortuous basilar artery without stenosis. Normal SCA and PCA origins. Posterior communicating arteries are diminutive or  absent. Mildly tortuous left P1 segment. Bilateral PCA branches are within normal limits. Posterior communicating arteries are diminutive or absent. Antegrade flow in both ICA siphons. Tortuous distal cervical right ICA. No siphon stenosis. Normal ophthalmic artery origins. Patent carotid termini. MCA and ACA origins are within normal limits. Mildly dominant right ACA A1 segment. Anterior communicating artery within normal limits. Dominant right ACA A2 segment. This somewhat simulates an azygos ACA anatomy. Mildly tortuous bilateral MCA M1 segments. No M1 stenosis. Bilateral MCA bifurcations and visible bilateral MCA branches are within normal limits. IMPRESSION: 1. Acute intra-axial hemorrhage in the left lentiform with estimated blood volume of 26 mL has not significantly changed since 07/24/2016. Surrounding edema and regional mass effect including partial effacement of the left lateral ventricle, but no intraventricular hemorrhage, extra-axial hemorrhage, or ventriculomegaly. 2. Negative intracranial MRA aside from intracranial artery tortuosity. 3. Moderate to severe for age nonspecific cerebral white matter signal changes. Top differential considerations include chronic small vessel disease and chronic demyelinating disease. Electronically Signed   By: Odessa Fleming M.D.   On: 07/28/2016 19:50   Dg Abd Portable 1v  Result Date: 07/29/2016 CLINICAL DATA:  Check feeding catheter placement EXAM: PORTABLE ABDOMEN - 1 VIEW COMPARISON:  07/27/2016 FINDINGS: Feeding catheter is again noted with its tip in the distal stomach. This is similar to that seen on the prior exam. Nonobstructive bowel gas pattern is noted. IMPRESSION: Feeding catheter in the distal stomach. Electronically Signed   By: Alcide Clever M.D.   On: 07/29/2016 13:53      PHYSICAL EXAM General - Well nourished, well developed, mild distress due to back pain and restless legs  Ophthalmologic - Fundi not visualized due to  noncooperation.  Cardiovascular - Regular rate and rhythm.  Neuro - awake alert. Oriented to place, year and people, but not age and month.dyasrthric speech. Able to following central and peripheral simple commands. Eyes attending to both sides, but not blinking to visual threat on the right, PERRL, right facial droop, tongue protrusion to the right. LUE 5/5, RUE minor withdrawal on pain stimulation. BLE 3/5 on pain. Sensation, coordination not cooperative, and gait not tested.   ASSESSMENT/PLAN [name] is a 61 y.o. female with history of arthritis, chronic pain, HTN and kidney stones found down with R hemiparesis. CT showed a L basal ganglia hemorrhage.   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 Mild edema w/ 2mm L to R shift. 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 Acute intra-axial hemorrhage in the left lentiform with estimated blood volume of 26 mL has not significantly changed since 07/24/2016. Surrounding edema and regional mass effect including partial effacement of the left lateral ventricle, but no intraventricular hemorrhage, extra-axial hemorrhage, or  ventriculomegaly. MRA brain Negative intracranial MRA aside from intracranial artery  tortuosity  2D Echo EF 55-60% It should be repeated when the patient is able to cooperate.  LDL 109  HgbA1c 5.2  Heparin subq for VTE prophylaxis Diet NPO time specified  No antithrombotic prior to admission  Ongoing aggressive stroke risk factor management  Therapy recommendations:  Rehab Admissions Coordinator following. Not yet ready for MD consult.  Disposition:  pending   Hypertensive emergency  Blood pressure as high as 181/104, 205/85 in setting of neurologic  symptoms   off Cleviprex   SBP goal < 140  Now on Lisinopril per tube and prn Labetalol.  BP 122/86  Hyperlipidemia  Home meds:  No statin.   LDL 109  No need to initiate statin at this time  Dysphagia  Did not pass swallow - Continue NPO per Speech Therapy 07/27/2016.  Nothing by mouth  Speech following  Now with Cortrak for TF and medication  Other Stroke Risk Factors  Former Cigarette smoker , quit 4 years ago   Urine drug screen positive for opiates and benzos  Obesity, Body mass index is 36.4 kg/m., recommend weight loss, diet and exercise as appropriate   Other Active Problems  Leukocytosis, WBC 17.0->12.7->11.3 -> 9.9  LBP - morphine PRN  Hypokalemia   replalce with potassium  Hospital day # 5   I have personally examined this patient, reviewed notes, independently viewed imaging studies, participated in medical decision making and plan of care.ROS completed by me personally and pertinent positives fully documented  I have made any additions or clarifications directly to the above note continue ongoing therapies and is solid valve. Greater than 50% time during this 25 minute visit was spent on counseling and coordination of care.  Heidi Heady, MD Medical Director [facility] Pager: [phone] 07/29/2016 3:10 PM   To contact Stroke Continuity provider, please refer to [email or web address]. After hours, contact General Neurology

## 2016-07-29 NOTE — Care Management Important Message (Signed)
Important Message  Patient Details  Name: Heidi Green MRN: [id number] Date of Birth: 10/10/1955   Medicare Important Message Given:  Yes    Kyla Balzarine 07/29/2016, 2:56 PM

## 2016-07-29 NOTE — Progress Notes (Signed)
Pt pulled Cortek tube out of right nare. Dr. Pearlean Brownie notified. Stated that we will address it tomorrow. Will continue to monitor.

## 2016-07-29 NOTE — Progress Notes (Signed)
RN called RD due to Cortrak tube being pulled out. RD stated that she would come to advance the tube so that we can initiate tube feedings again. Will continue to monitor.

## 2016-07-29 NOTE — Progress Notes (Signed)
Physical Therapy Treatment Patient Details Name: Heidi Green MRN: [id number] DOB: 11-13-1955 Today's Date: 07/29/2016    History of Present Illness Pt is a 61 y.o. female admitted to ED on 07/24/16 after being found down with altered mental status; CT showed L basal ganglia centered intraparenchymal hematoma. Pertinent PMH include HTN, chronic severe back pain, arthritis.     PT Comments    Patient seen for activity progression. Tolerated hygiene and pericare in bed with rolling. Patient able to initiate with LUE to roll to the right, all other aspects of mobility required max to tal assist of +2. Patient assisted to EOB, tolerated EOB and trunk control activities prior to transfer OOB to chair. At this time, will continue with current POC.   Follow Up Recommendations  CIR;Supervision/Assistance - 24 hour     Equipment Recommendations  Other (comment) (TBD)    Recommendations for Other Services Rehab consult;OT consult     Precautions / Restrictions Precautions Precautions: Fall Restrictions Weight Bearing Restrictions: No    Mobility  Bed Mobility Overal bed mobility: Needs Assistance Bed Mobility: Rolling;Supine to Sit Rolling: Max assist;Total assist   Supine to sit: +2 for physical assistance;Total assist     General bed mobility comments: max assist to roll to the right using LUE to grip bed rail, total assist to the left due to inability to use RUE to assist. +2 total assist to rotate trunk and elevate to upright. Patient with heavy right lateral lean, increased assist to maintain upright position at EOB.   Transfers Overall transfer level: Needs assistance Equipment used:  (2 person face to face with chuck pad) Transfers: Lateral/Scoot Transfers          Lateral/Scoot Transfers: Total assist;+2 physical assistance General transfer comment: Lateral scoot from bed to chair in 4 moves with gait belt and chuck pad  Ambulation/Gait                 Stairs             Wheelchair Mobility    Modified Rankin (Stroke Patients Only) Modified Rankin (Stroke Patients Only) Pre-Morbid Rankin Score: No symptoms Modified Rankin: Severe disability     Balance Overall balance assessment: Needs assistance Sitting-balance support: Single extremity supported;Feet supported Sitting balance-Leahy Scale: Zero Sitting balance - Comments: Patient max to total assist, attempted to faciliate midline with left lean on elbow but unable to maintain. Patient does show some initiation of trunk flexion/extension but unable to maintain control Postural control: Right lateral lean;Posterior lean                                  Cognition Arousal/Alertness: Lethargic (aroused when upright) Behavior During Therapy: Flat affect Overall Cognitive Status: Impaired/Different from baseline Area of Impairment: Attention;Following commands;Safety/judgement                 Orientation Level: Disoriented to;Person;Place;Time;Situation Current Attention Level: Focused   Following Commands: Follows one step commands inconsistently Safety/Judgement: Decreased awareness of safety;Decreased awareness of deficits (pushing with LUE towards her weak side) Awareness: Intellectual          Exercises      General Comments General comments (skin integrity, edema, etc.): hygiene and pericare performed      Pertinent Vitals/Pain Pain Assessment: Faces Faces Pain Scale: Hurts even more Pain Location: lower abdomen Pain Descriptors / Indicators: Aching;Grimacing;Guarding;Moaning Pain Intervention(s): Monitored during session    Home Living  Prior Function            PT Goals (current goals can now be found in the care plan section) Acute Rehab PT Goals Patient Stated Goal: Unable to state goals PT Goal Formulation: Patient unable to participate in goal setting Time For Goal Achievement: 08/09/16 Progress towards  PT goals: Progressing toward goals    Frequency    Min 4X/week      PT Plan Current plan remains appropriate    Co-evaluation             End of Session Equipment Utilized During Treatment: Gait belt Activity Tolerance: Patient limited by fatigue Patient left: in bed;with call bell/phone within reach;with bed alarm set Nurse Communication: Mobility status PT Visit Diagnosis: Other abnormalities of gait and mobility (R26.89);Other symptoms and signs involving the nervous system (R29.898)     Time: 4098-1191 PT Time Calculation (min) (ACUTE ONLY): 25 min  Charges:  $Therapeutic Activity: 23-37 mins                    G Codes:       Charlotte Crumb, PT DPT  [phone]    Fabio Asa 07/29/2016, 3:16 PM

## 2016-07-29 NOTE — Procedures (Signed)
Objective Swallowing Evaluation: Type of Study: FEES-Fiberoptic Endoscopic Evaluation of Swallow  Patient Details  Name: Heidi Green MRN: [id number] Date of Birth: 1955-11-12  Today's Date: 07/29/2016 Time: SLP Start Time (ACUTE ONLY): 1145-SLP Stop Time (ACUTE ONLY): 1215 SLP Time Calculation (min) (ACUTE ONLY): 30 min  Past Medical History:  Past Medical History:  Diagnosis Date  . Arthritis    in back  . Back pain   . Chest pain 06/16/2012   negative cath with normal LV function  . Headache(784.0)   . Hypertension   . Kidney stones    Past Surgical History:  Past Surgical History:  Procedure Laterality Date  . ABDOMINAL HYSTERECTOMY    . BACK SURGERY    . LEFT HEART CATHETERIZATION WITH CORONARY ANGIOGRAM N/A 06/16/2012   Procedure: LEFT HEART CATHETERIZATION WITH CORONARY ANGIOGRAM;  Surgeon: Peter M Swaziland, MD;  Location: [facility] CATH LAB;  Service: Cardiovascular;  Laterality: N/A;  . LITHOTRIPSY    . TUBAL LIGATION     HPI: Heidi Green is a 61 y.o. female with history of arthritis, chronic pain, HTN and kidney stones found down with R hemiparesis. CT showed a L basal ganglia hemorrhage.   No Data Recorded   Assessment / Plan / Recommendation  CHL IP CLINICAL IMPRESSIONS 07/29/2016  Clinical Impression Pt demonstrates a severe dysphagia secondary to CN VII and CN XII weakness on the right, complicated by significant lethargy and generalized weakness. Pt could not sustain arousal during assessment for safe swallowing and respiratory mechanics for coughing very poor. Initial sips of liquids were tolerated well, with occsaional delay, but good pahryngeal strength with puree and honey and nectar thick liquids via teaspoon. Large boluses of nectar were aspirated before the swallow. As study progressed, pt became more lethargic and oral residuals collected and began to spill to the pharynx post swallow. Silent aspiration occured after the swallow and SLP could not elicit a forceful  cough or throat clear from pt despite max cueing. Overall aspiration risk is very high unless arousal and endurance improve. Pt will need to be able to follow compensatory stretegies such as second swallows to clear oral cavity or preventative throat clearing to  be able to tolerate even modified diets.   SLP Visit Diagnosis Dysphagia, oropharyngeal phase (R13.12)  Attention and concentration deficit following --  Frontal lobe and executive function deficit following --  Impact on safety and function Severe aspiration risk      CHL IP TREATMENT RECOMMENDATION 07/29/2016  Treatment Recommendations Therapy as outlined in treatment plan below     Prognosis 07/29/2016  Prognosis for Safe Diet Advancement Fair  Barriers to Reach Goals Cognitive deficits  Barriers/Prognosis Comment --    CHL IP DIET RECOMMENDATION 07/29/2016  SLP Diet Recommendations NPO;Ice chips PRN after oral care  Liquid Administration via --  Medication Administration Via alternative means  Compensations --  Postural Changes --      CHL IP OTHER RECOMMENDATIONS 07/29/2016  Recommended Consults --  Oral Care Recommendations Oral care QID  Other Recommendations Have oral suction available      CHL IP FOLLOW UP RECOMMENDATIONS 07/29/2016  Follow up Recommendations Skilled Nursing facility      [facility] IP FREQUENCY AND DURATION 07/29/2016  Speech Therapy Frequency (ACUTE ONLY) min 2x/week  Treatment Duration 2 weeks           CHL IP ORAL PHASE 07/29/2016  Oral Phase Impaired  Oral - Pudding Teaspoon --  Oral - Pudding Cup --  Oral - Honey Teaspoon Decreased bolus cohesion;Delayed oral transit;Other (Comment);Right pocketing in lateral sulci  Oral - Honey Cup --  Oral - Nectar Teaspoon Decreased bolus cohesion;Delayed oral transit;Other (Comment);Right pocketing in lateral sulci  Oral - Nectar Cup Decreased bolus cohesion;Delayed oral transit;Other (Comment);Right pocketing in lateral sulci  Oral - Nectar Straw  Decreased bolus cohesion;Delayed oral transit;Other (Comment);Right pocketing in lateral sulci;Reduced posterior propulsion  Oral - Thin Teaspoon --  Oral - Thin Cup --  Oral - Thin Straw --  Oral - Puree Delayed oral transit  Oral - Mech Soft --  Oral - Regular --  Oral - Multi-Consistency --  Oral - Pill --  Oral Phase - Comment --    CHL IP PHARYNGEAL PHASE 07/29/2016  Pharyngeal Phase Impaired  Pharyngeal- Pudding Teaspoon --  Pharyngeal --  Pharyngeal- Pudding Cup --  Pharyngeal --  Pharyngeal- Honey Teaspoon Delayed swallow initiation-vallecula;Penetration/Apiration after swallow;Trace aspiration;Inter-arytenoid space residue  Pharyngeal Material enters airway, passes BELOW cords without attempt by patient to eject out (silent aspiration)  Pharyngeal- Honey Cup Delayed swallow initiation-vallecula;Penetration/Apiration after swallow;Inter-arytenoid space residue;Moderate aspiration  Pharyngeal Material enters airway, passes BELOW cords without attempt by patient to eject out (silent aspiration)  Pharyngeal- Nectar Teaspoon Delayed swallow initiation-vallecula;Penetration/Apiration after swallow;Inter-arytenoid space residue;Moderate aspiration  Pharyngeal Material enters airway, passes BELOW cords without attempt by patient to eject out (silent aspiration)  Pharyngeal- Nectar Cup Delayed swallow initiation-vallecula;Penetration/Apiration after swallow;Inter-arytenoid space residue;Moderate aspiration  Pharyngeal Material enters airway, passes BELOW cords without attempt by patient to eject out (silent aspiration)  Pharyngeal- Nectar Straw Delayed swallow initiation-vallecula;Penetration/Apiration after swallow;Inter-arytenoid space residue;Moderate aspiration  Pharyngeal Material enters airway, passes BELOW cords without attempt by patient to eject out (silent aspiration)  Pharyngeal- Thin Teaspoon --  Pharyngeal --  Pharyngeal- Thin Cup --  Pharyngeal --  Pharyngeal- Thin Straw  --  Pharyngeal --  Pharyngeal- Puree --  Pharyngeal --  Pharyngeal- Mechanical Soft --  Pharyngeal --  Pharyngeal- Regular --  Pharyngeal --  Pharyngeal- Multi-consistency --  Pharyngeal --  Pharyngeal- Pill --  Pharyngeal --  Pharyngeal Comment --     No flowsheet data found.  No flowsheet data found.  Aulton Routt, Riley Nearing 07/29/2016, 2:29 PM

## 2016-07-30 LAB — BASIC METABOLIC PANEL
Anion gap: 11 (ref 5–15)
BUN: 12 mg/dL (ref 6–20)
CALCIUM: 9.1 mg/dL (ref 8.9–10.3)
CHLORIDE: 109 mmol/L (ref 101–111)
CO2: 20 mmol/L — AB (ref 22–32)
CREATININE: 0.66 mg/dL (ref 0.44–1.00)
GFR calc non Af Amer: >60 mL/min (ref >60)
Glucose, Bld: 104 mg/dL — ABNORMAL HIGH (ref 65–99)
Potassium: 3.4 mmol/L — ABNORMAL LOW (ref 3.5–5.1)
Sodium: 140 mmol/L (ref 135–145)

## 2016-07-30 LAB — CBC
HEMATOCRIT: 41.3 % (ref 36.0–46.0)
HEMOGLOBIN: 13.6 g/dL (ref 12.0–15.0)
MCH: 28.8 pg (ref 26.0–34.0)
MCHC: 32.9 g/dL (ref 30.0–36.0)
MCV: 87.5 fL (ref 78.0–100.0)
Platelets: 247 10*3/uL (ref 150–400)
RBC: 4.72 MIL/uL (ref 3.87–5.11)
RDW: 14.7 % (ref 11.5–15.5)
WBC: 10.4 10*3/uL (ref 4.0–10.5)

## 2016-07-30 LAB — GLUCOSE, CAPILLARY
GLUCOSE-CAPILLARY: 110 mg/dL — AB (ref 65–99)
GLUCOSE-CAPILLARY: 114 mg/dL — AB (ref 65–99)
GLUCOSE-CAPILLARY: 114 mg/dL — AB (ref 65–99)
Glucose-Capillary: 97 mg/dL (ref 65–99)
Glucose-Capillary: 98 mg/dL (ref 65–99)
Glucose-Capillary: 107 mg/dL — ABNORMAL HIGH (ref 65–99)

## 2016-07-30 MED ORDER — SODIUM CHLORIDE 0.9 % IV SOLN
30.0000 meq | Freq: Once | INTRAVENOUS | Status: AC
  Administered 2016-07-30: 30 meq via INTRAVENOUS
  Filled 2016-07-30: qty 15

## 2016-07-30 NOTE — Progress Notes (Signed)
CSW placed bed offers at bedside for patient husband to review- informed spouse that patient likely ready for DC soon.  Per RN patient much more alert and oriented this afternoon and following commands- patient will need updated PT note if ready to DC tomorrow  CSW informed CIR coordinator that patient mentation has improved drastically today  CSW will continue to follow  Burna Sis, LCSW Clinical Social Worker [phone]

## 2016-07-30 NOTE — Clinical Social Work Note (Addendum)
Clinical Social Work Assessment  Patient Details  Name: Heidi Green MRN: [id number] Date of Birth: 01-09-1956  Date of referral:  07/30/16               Reason for consult:  Facility Placement                Permission sought to share information with:  Facility Industrial/product designer granted to share information::  No (pt DOx3)  Name::     Heidi Green::  SNF  Relationship::  spouse  Contact Information:     Housing/Transportation Living arrangements for the past 2 months:  Single Family Home Source of Information:  Spouse Patient Interpreter Needed:  None Criminal Activity/Legal Involvement Pertinent to Current Situation/Hospitalization:  No - Comment as needed Significant Relationships:  Spouse Lives with:  Spouse Do you feel safe going back to the place where you live?  No Need for family participation in patient care:  Yes (Comment) (decision making)  Care giving concerns:  Patient was living at home with spouse prior to admission.  According to spouse patient had mobility issues prior to stroke but was capable of doing ADLs with assistive devices.  Patient currently requiring total assist with mobility and would not be safe to return home with spouse who works.   Social Worker assessment / plan:  CSW went by patient room to see if she could participate with assessment.  Patient was moaning constantly and nurse said patient was fairly disoriented.    CSW called pt spouse to discuss PT recommendation for rehab at time of DC.  CSW discussed CIR and likely barriers to CIR with patient current mental status- does not seem likely that pt could participate with rehab program.  CSW explained SNF as alternative option that would provide less intensive rehab with longer rehab stay. CSW explained SNF and SNF referral process.  Employment status:  Disabled (Comment on whether or not currently receiving Disability) Insurance information:  Managed Medicare PT Recommendations:   Inpatient Rehab Consult Information / Referral to community resources:  Skilled Nursing Facility  Patient/Family's Response to care:  Pt spouse is agreeable to short term SNF placement if CIR is not an option.  Spouse is concerned with what patients response would be- states she worked at SNFs in the past and does not think she will be interested in being a patient at one.  Patient/Family's Understanding of and Emotional Response to Diagnosis, Current Treatment, and Prognosis:  Patient spouse seems to have clear understanding of pt condition- very realistic about patient needs at this time and agreeable to available options for rehab.    Emotional Assessment Appearance:  Appears stated age Attitude/Demeanor/Rapport:  Unable to Assess Affect (typically observed):  Agitated Orientation:  Oriented to Self Alcohol / Substance use:  Not Applicable Psych involvement (Current and /or in the community):  No (Comment)  Discharge Needs  Concerns to be addressed:  Care Coordination Readmission within the last 30 days:  Yes Current discharge risk:  Physical Impairment Barriers to Discharge:  Continued Medical Work up   Burna Sis, LCSW 07/30/2016, 11:29 AM

## 2016-07-30 NOTE — NC FL2 (Signed)
[place] MEDICAID FL2 LEVEL OF CARE SCREENING TOOL     IDENTIFICATION  Patient Name: Heidi Green Birthdate: [birth year] Sex: female Admission Date (Current Location): 07/24/2016  [facility] and [place] Number:  Producer, television/film/video and Address:  [facility] [place], [address] [place], [place] [address]      Provider Number: [id number]  Attending Physician Name and Address:  Micki Riley, MD  Relative Name and Phone Number:       Current Level of Care: Hospital Recommended Level of Care: Skilled Nursing Facility Prior Approval Number:    Date Approved/Denied:   PASRR Number: [id number]  Discharge Plan: SNF    Current Diagnoses: Patient Active Problem List   Diagnosis Date Noted  . ICH (intracerebral hemorrhage) (HCC) 07/24/2016  . Class 2 obesity   . Acute encephalopathy 07/18/2016  . NSTEMI (non-ST elevated myocardial infarction) (HCC) 06/17/2012  . Chest pain 06/16/2012  . HTN (hypertension) 06/16/2012  . Dyslipidemia 06/16/2012  . Back pain 06/16/2012  . Tobacco abuse 06/16/2012    Orientation RESPIRATION BLADDER Height & Weight     Self  Normal Incontinent Weight: 222 lb 3.6 oz (100.8 kg) Height:   (167.6 cm)  BEHAVIORAL SYMPTOMS/MOOD NEUROLOGICAL BOWEL NUTRITION STATUS      Incontinent Diet (see DC summary)  AMBULATORY STATUS COMMUNICATION OF NEEDS Skin   Extensive Assist Verbally Normal                       Personal Care Assistance Level of Assistance  Bathing, Dressing, Feeding Bathing Assistance: Maximum assistance Feeding assistance: Maximum assistance Dressing Assistance: Maximum assistance     Functional Limitations Info             SPECIAL CARE FACTORS FREQUENCY  PT (By licensed PT), OT (By licensed OT)     PT Frequency: 5/wk OT Frequency: 5/wk            Contractures      Additional Factors Info  Code Status, Allergies Code Status Info: FULL Allergies Info: Garamycin Gentamicin Sulfate,  Penicillins, Norvasc Amlodipine Besylate           Current Medications (07/30/2016):  This is the current hospital active medication list Current Facility-Administered Medications  Medication Dose Route Frequency Provider Last Rate Last Dose  .  stroke: mapping our early stages of recovery book   Does not apply Once Ulice Dash, PA-C      . 0.9 %  sodium chloride infusion   Intravenous Continuous Ulice Dash, PA-C 75 mL/hr at 07/30/16 5621    . acetaminophen (TYLENOL) tablet 650 mg  650 mg Oral Q4H PRN Ulice Dash, PA-C       Or  . acetaminophen (TYLENOL) solution 650 mg  650 mg Per Tube Q4H PRN Ulice Dash, PA-C   325 mg at 07/27/16 2037   Or  . acetaminophen (TYLENOL) suppository 650 mg  650 mg Rectal Q4H PRN Ulice Dash, PA-C   650 mg at 07/26/16 1321  . baclofen (LIORESAL) tablet 10 mg  10 mg Oral BID Marvel Plan, MD   Stopped at 07/28/16 1000  . feeding supplement (VITAL HIGH PROTEIN) liquid 1,000 mL  1,000 mL Per Tube Continuous Marvel Plan, MD 60 mL/hr at 07/29/16 1408 1,000 mL at 07/29/16 1408  . fluticasone (FLONASE) 50 MCG/ACT nasal spray 2 spray  2 spray Each Nare Daily Marvel Plan, MD      . heparin injection  5,000 Units  5,000 Units Subcutaneous Q8H Marvel Plan, MD   5,000 Units at 07/30/16 [phone]  . HYDROcodone-acetaminophen (NORCO/VICODIN) 5-325 MG per tablet 1 tablet  1 tablet Oral Q6H PRN Marvel Plan, MD   1 tablet at 07/27/16 2038  . labetalol (NORMODYNE,TRANDATE) injection 10 mg  10 mg Intravenous Q10 min PRN Marvel Plan, MD   10 mg at 07/30/16 0312  . lisinopril (PRINIVIL,ZESTRIL) tablet 10 mg  10 mg Oral Daily Marvel Plan, MD   Stopped at 07/28/16 1000  . loratadine (CLARITIN) tablet 10 mg  10 mg Oral Daily Marvel Plan, MD   Stopped at 07/28/16 1000  . morphine 2 MG/ML injection 1 mg  1 mg Intravenous Q2H PRN Micki Riley, MD   1 mg at 07/30/16 1002  . ondansetron (ZOFRAN) injection 4 mg  4 mg Intravenous Q4H PRN Rejeana Brock, MD   4 mg at 07/27/16 2217  .  pantoprazole (PROTONIX) injection 40 mg  40 mg Intravenous QHS Ulice Dash, PA-C   40 mg at 07/29/16 2143  . potassium chloride (KLOR-CON) packet 40 mEq  40 mEq Oral TID Layne Benton, NP   40 mEq at 07/29/16 1654  . rOPINIRole (REQUIP) tablet 2 mg  2 mg Oral BID Marvel Plan, MD   Stopped at 07/28/16 1000  . senna-docusate (Senokot-S) tablet 1 tablet  1 tablet Oral BID Ulice Dash, PA-C   Stopped at 07/28/16 1000     Discharge Medications: Please see discharge summary for a list of discharge medications.  Relevant Imaging Results:  Relevant Lab Results:   Additional Information SS#: [id number]  Burna Sis, LCSW  I have personally examined this patient, reviewed notes, independently viewed imaging studies, participated in medical decision making and plan of care.ROS completed by me personally and pertinent positives fully documented  I have made any additions or clarifications directly to the above note. Agree with note above.    Delia Heady, MD Medical Director [facility] Pager: [phone] 07/30/2016 3:51 PM

## 2016-07-30 NOTE — Progress Notes (Signed)
STROKE TEAM PROGRESS NOTE   SUBJECTIVE (INTERVAL HISTORY) She pulled out feeding tube.She is refusing to have the tube placed back in. However she appears more alert and speech is clear and I'm hopeful she will do better on the swallow test  OBJECTIVE Temp:  [98.6 F (37 C)-99.7 F (37.6 C)] 98.8 F (37.1 C) (04/17 1148) Pulse Rate:  [67-95] 67 (04/17 1148) Cardiac Rhythm: Normal sinus rhythm (04/17 0800) Resp:  [7-26] 20 (04/17 1148) BP: (126-184)/(64-118) 132/64 (04/17 1148) SpO2:  [91 %-100 %] 97 % (04/17 1148) Weight:  [222 lb 3.6 oz (100.8 kg)] 222 lb 3.6 oz (100.8 kg) (04/17 0500)  CBC:   Recent Labs Lab 07/24/16 1619  07/29/16 0231 07/30/16 0249  WBC 16.2*  < > 10.8* 10.4  NEUTROABS 13.4*  --   --   --   HGB 14.5  < > 15.7* 13.6  HCT 44.5  < > 47.8* 41.3  MCV 88.3  < > 86.9 87.5  PLT 239  < > 197 247  < > = values in this interval not displayed.  Basic Metabolic Panel:   Recent Labs Lab 07/28/16 0458 07/28/16 1816 07/29/16 0231 07/30/16 0249  NA 139  --  140 140  K 3.5  --  2.9* 3.4*  CL 108  --  106 109  CO2 23  --  25 20*  GLUCOSE 105*  --  92 104*  BUN 23*  --  9 12  CREATININE 0.69  --  0.59 0.66  CALCIUM 8.8*  --  9.2 9.1  MG 2.0 2.0  --   --   PHOS 3.8 3.4  --   --     Lipid Panel:     Component Value Date/Time   CHOL 195 07/25/2016 0226   TRIG 107 07/25/2016 0226   HDL 65 07/25/2016 0226   CHOLHDL 3.0 07/25/2016 0226   VLDL 21 07/25/2016 0226   LDLCALC 109 (H) 07/25/2016 0226   HgbA1c:  Lab Results  Component Value Date   HGBA1C 5.2 07/25/2016   Urine Drug Screen:     Component Value Date/Time   LABOPIA POSITIVE (A) 07/26/2016 1007   COCAINSCRNUR NONE DETECTED 07/26/2016 1007   LABBENZ NONE DETECTED 07/26/2016 1007   AMPHETMU NONE DETECTED 07/26/2016 1007   THCU NONE DETECTED 07/26/2016 1007   LABBARB NONE DETECTED 07/26/2016 1007    Alcohol Level No results found for: ETH    IMAGING Dg Abd Portable 1v  Result Date:  07/29/2016 CLINICAL DATA:  Check feeding catheter placement EXAM: PORTABLE ABDOMEN - 1 VIEW COMPARISON:  07/27/2016 FINDINGS: Feeding catheter is again noted with its tip in the distal stomach. This is similar to that seen on the prior exam. Nonobstructive bowel gas pattern is noted. IMPRESSION: Feeding catheter in the distal stomach. Electronically Signed   By: Alcide Clever M.D.   On: 07/29/2016 13:53      PHYSICAL EXAM General - Well nourished, well developed, mild distress due to back pain and restless legs  Ophthalmologic - Fundi not visualized due to noncooperation.  Cardiovascular - Regular rate and rhythm.  Neuro - awake alert. Oriented to place, year and people, but not age and month.minimally dyasrthric speech. Able to following central and peripheral simple commands. Eyes attending to both sides, but not blinking to visual threat on the right, PERRL, right facial droop, tongue protrusion to the right. LUE 5/5, RUE minor withdrawal on pain stimulation. BLE 3/5 on pain. Sensation, coordination not cooperative, and gait not  tested.   ASSESSMENT/PLAN [name] is a 61 y.o. female with history of arthritis, chronic pain, HTN and kidney stones found down with R hemiparesis. CT showed a L basal ganglia hemorrhage.   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 Mild edema w/ 2mm L to R shift. 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 Acute intra-axial hemorrhage in the left lentiform with estimated blood volume of 26 mL has not significantly changed since 07/24/2016. Surrounding edema and regional mass effect including partial effacement of the left lateral ventricle, but no intraventricular hemorrhage, extra-axial hemorrhage,  or  ventriculomegaly. MRA brain Negative intracranial MRA aside from intracranial artery  tortuosity  2D Echo EF 55-60% It should be repeated when the patient is able to cooperate.  LDL 109  HgbA1c 5.2  Heparin subq for VTE prophylaxis DIET - DYS 1 Room service appropriate? Yes; Fluid consistency: Honey Thick  No antithrombotic prior to admission  Ongoing aggressive stroke risk factor management  Therapy recommendations:  Rehab Admissions Coordinator following. Not yet ready for MD consult.  Disposition:  pending   Hypertensive emergency  Blood pressure as high as 181/104, 205/85 in setting of neurologic symptoms   off Cleviprex   SBP goal < 140  Now on Lisinopril per tube and prn Labetalol.  BP 122/86  Hyperlipidemia  Home meds:  No statin.   LDL 109  No need to initiate statin at this time  Dysphagia  Did not pass swallow - Continue NPO per Speech Therapy 07/27/2016.  Nothing by mouth  Speech following  Now with Cortrak for TF and medication  Other Stroke Risk Factors  Former Cigarette smoker , quit 4 years ago   Urine drug screen positive for opiates and benzos  Obesity, Body mass index is 35.87 kg/m., recommend weight loss, diet and exercise as appropriate   Other Active Problems  Leukocytosis, WBC 17.0->12.7->11.3 -> 9.9  LBP - morphine PRN  Hypokalemia   replalce with potassium  Hospital day # 6   I have personally examined this patient, reviewed notes, independently viewed imaging studies, participated in medical decision making and plan of care.ROS completed by me personally and pertinent positives fully documented  I have made any additions or clarifications directly to the above note continue ongoing therapies and is solid valve. Greater than 50% time during this 25 minute visit was spent on counseling patient about her dysphagia and coordination of care. Discussed with speech pathologist  Delia Heady, MD Medical Director [facility] Pager: [phone] 07/30/2016 1:31 PM   To contact Stroke Continuity provider, please refer to [email or web address]. After hours, contact General Neurology

## 2016-07-30 NOTE — Care Management Note (Addendum)
Case Management Note  Patient Details  Name: Heidi Green MRN: [id number] Date of Birth: [birth year]  Subjective/Objective:     Pt admitted on 07/24/16 with AMS and Lt intraparenchymal hemorrhage.  PTA, pt independent, lives with spouse.  Per pt rec SNF.                               Action/Plan: NCM will follow patient progression.  Expected Discharge Date:                  Expected Discharge Plan:  IP Rehab Facility  In-House Referral:     Discharge planning Services  CM Consult  Post Acute Care Choice:    Choice offered to:     DME Arranged:    DME Agency:     HH Arranged:    HH Agency:     Status of Service:  In process, will continue to follow  If discussed at Long Length of Stay Meetings, dates discussed:    Additional Comments:  Leone Haven, RN 07/30/2016, 6:44 PM

## 2016-07-30 NOTE — Plan of Care (Signed)
Problem: Nutrition: Goal: Dietary intake will improve Outcome: Progressing Patient able to eat 50% of lunch tray without difficulty.

## 2016-07-30 NOTE — Progress Notes (Signed)
  Speech Language Pathology Treatment: Dysphagia  Patient Details Name: Heidi Green MRN: [id number] DOB: [birth year] Today's Date: 07/30/2016 Time: 4098-1191 SLP Time Calculation (min) (ACUTE ONLY): 12 min  Assessment / Plan / Recommendation Clinical Impression  MD requested SLP reassess pt today as she is significantly more alert. Pt fully alert, but restless, reporting pain.  Right sided lingual and facial weakness still evident though pt fully cooperative with oral motor exam. SLP repositioned pt and she was able to use LUE to sustain upright posture for PO throughout assessment. Provided trials to puree and honey thick liquids as these were the best tolerated textures on FEES yesterday. Pt was able to initiate a swift swallow response and though she would not complete second swallows or throat clearing upon command ("I cant do it, I dont know how") despite cueing, she did begin making independent efforts to fully clear oral cavity of residue and complete a second swallow with honey thick liquids. Hopeful that pt can sustain this level of arousal for consistent PO intake. If she does become lethargic after pain medication her swallowing will be unreliable with high risk of aspiration. Pt was observed to silently aspirate oral residuals that fell to her airway due to lethargy with PO.   HPI HPI: Heidi Green is a 61 y.o. female with history of arthritis, chronic pain, HTN and kidney stones found down with R hemiparesis. CT showed a L basal ganglia hemorrhage.       SLP Plan  Continue with current plan of care       Recommendations  Diet recommendations: Dysphagia 1 (puree);Honey-thick liquid Liquids provided via: Cup;Teaspoon Medication Administration: Whole meds with puree Supervision: Full supervision/cueing for compensatory strategies Compensations: Slow rate;Small sips/bites;Lingual sweep for clearance of pocketing;Monitor for anterior loss                Oral Care  Recommendations: Oral care BID SLP Visit Diagnosis: Dysphagia, oropharyngeal phase (R13.12) Plan: Continue with current plan of care       GO               [facility], MA CCC-SLP [phone]  Claudine Mouton 07/30/2016, 11:09 AM

## 2016-07-31 DIAGNOSIS — I69391 Dysphagia following cerebral infarction: Secondary | ICD-10-CM

## 2016-07-31 DIAGNOSIS — I69320 Aphasia following cerebral infarction: Secondary | ICD-10-CM

## 2016-07-31 DIAGNOSIS — G8111 Spastic hemiplegia affecting right dominant side: Secondary | ICD-10-CM

## 2016-07-31 LAB — GLUCOSE, CAPILLARY
GLUCOSE-CAPILLARY: 101 mg/dL — AB (ref 65–99)
GLUCOSE-CAPILLARY: 112 mg/dL — AB (ref 65–99)
GLUCOSE-CAPILLARY: 114 mg/dL — AB (ref 65–99)
Glucose-Capillary: 106 mg/dL — ABNORMAL HIGH (ref 65–99)
Glucose-Capillary: 99 mg/dL (ref 65–99)
Glucose-Capillary: 109 mg/dL — ABNORMAL HIGH (ref 65–99)

## 2016-07-31 MED ORDER — MORPHINE SULFATE (PF) 2 MG/ML IV SOLN
1.0000 mg | INTRAVENOUS | Status: DC | PRN
  Administered 2016-07-31 – 2016-08-01 (×2): 1 mg via INTRAVENOUS
  Filled 2016-07-31 (×2): qty 1

## 2016-07-31 NOTE — Progress Notes (Signed)
  Speech Language Pathology Treatment: Dysphagia;Cognitive-Linquistic  Patient Details Name: Heidi Green MRN: [id number] DOB: [birth year] Today's Date: 07/31/2016 Time: 4098-1191 SLP Time Calculation (min) (ACUTE ONLY): 19 min  Assessment / Plan / Recommendation Clinical Impression  Pt seen with am meal, up in chair. Pt able to self feed with spoor or cup, but unwilling to eat and drink without max encouragement. SLP provided max verbal cues for reasoning for modified textures and verbal cues for left lean and placement of boluses on left side of mouth. Pt could not participate in reasoning for safety awareness despite efforts. Accepted 10 sips of nectar thick liquids, only coughing following straw sips. Able to adequately prepare soft solids with extra time. Given that adequate arousal has been observed today and yesterday for PO and pt is tolerating nectar thick cup sips, will advance diet to dys 2/nectar thick with the hope that pt will find texture more appealing for intake.   HPI HPI: [name] is a 61 y.o. female with history of arthritis, chronic pain, HTN and kidney stones found down with R hemiparesis. CT showed a L basal ganglia hemorrhage.       SLP Plan  Continue with current plan of care       Recommendations  Diet recommendations: Dysphagia 2 (fine chop);Nectar-thick liquid Liquids provided via: Cup;No straw Medication Administration: Whole meds with puree Supervision: Staff to assist with self feeding Compensations: Slow rate;Small sips/bites;Lingual sweep for clearance of pocketing;Monitor for anterior loss Postural Changes and/or Swallow Maneuvers: Seated upright 90 degrees                Oral Care Recommendations: Oral care BID Follow up Recommendations: Skilled Nursing facility SLP Visit Diagnosis: Dysphagia, oropharyngeal phase (R13.12) Plan: Continue with current plan of care       GO               [facility], [place] CCC-SLP [phone]  Claudine Mouton 07/31/2016, 9:58 AM

## 2016-07-31 NOTE — Consult Note (Signed)
Physical Medicine and Rehabilitation Consult Reason for Consult: Right sided weakness with facial droop Referring Physician: Dr. Pearlean Brownie   HPI: Heidi Green is a 61 y.o. right handed female with history of hypertension, chronic back pain, remote tobacco abuse. Per chart review patient lives with spouse reportedly independent prior to admission. Two-level home with bed and bathroom upstairs. 3 steps to entry of the home. Husband works during the day. Patient with recent admission 07/18/2016 and discharge 07/19/2016 related to altered mental status agitation felt to be associated with medications. Cranial CT scan was negative and was discharged home. Presented 07/24/2016 and was found on the garage floor with altered mental status right-sided weakness. Cranial CT scan showed intraparenchymal hematoma centered in the left basal ganglia measuring 4.8 x 3.5 x 2.7 cm. Mild surrounding edema. Mild mass effect with left to right shift. Old left frontal infarct. No hydrocephalus. Neurology follow-up advise conservative care. Follow neurology services hemorrhage felt to be secondary to hypertensive emergency. Follow-up MRI/MRA showing no significant changes and hemorrhage. MRA was negative. Echocardiogram with ejection fraction of 60% and normal systolic function. Subcutaneous heparin later added for DVT prophylaxis 07/27/2016. Currently on dysphagia #2 nectar thick liquid diet. Physical therapy evaluation completed ongoing with recommendations of physical medicine rehabilitation consult.  Patient lying in bed on her right side, complaining of pain, obvious aphasia Per her husband and her friend. She has been in a chair for about 2 hours today. He did get pain medication prior to being transferred back to bed.    Review of Systems  Unable to perform ROS: Acuity of condition   Past Medical History:  Diagnosis Date  . Arthritis    in back  . Back pain   . Chest pain 06/16/2012   negative cath with  normal LV function  . Headache(784.0)   . Hypertension   . Kidney stones    Past Surgical History:  Procedure Laterality Date  . ABDOMINAL HYSTERECTOMY    . BACK SURGERY    . LEFT HEART CATHETERIZATION WITH CORONARY ANGIOGRAM N/A 06/16/2012   Procedure: LEFT HEART CATHETERIZATION WITH CORONARY ANGIOGRAM;  Surgeon: Peter M Swaz[place], MD;  Location: [facility] CATH LAB;  Service: Cardiovascular;  Laterality: N/A;  . LITHOTRIPSY    . TUBAL LIGATION     Family History  Problem Relation Age of Onset  . Emphysema Mother   . Diabetes Mother    Social History:  reports that she quit smoking about 4 years ago. Her smoking use included Cigarettes. She has a 13.00 pack-year smoking history. She has never used smokeless tobacco. She reports that she does not drink alcohol or use drugs. Allergies:  Allergies  Allergen Reactions  . Garamycin [Gentamicin Sulfate] Hives and Shortness Of Breath  . Penicillins Anaphylaxis, Itching and Swelling    Has patient had a PCN reaction causing immediate rash, facial/tongue/throat swelling, SOB or lightheadedness with hypotension: Yes Has patient had a PCN reaction causing severe rash involving mucus membranes or skin necrosis: No Has patient had a PCN reaction that required hospitalization No Has patient had a PCN reaction occurring within the last 10 years: No If all of the above answers are "NO", then may proceed with Cephalosporin use.   Marland Kitchen Norvasc [Amlodipine Besylate] Nausea And Vomiting   Medications Prior to Admission  Medication Sig Dispense Refill  . cyanocobalamin (,VITAMIN B-12,) 1000 MCG/ML injection Inject 1,000 mcg into the muscle every 30 (thirty) days.     . fluticasone (FLONASE) 50 MCG/ACT  nasal spray Place 2 sprays into both nostrils daily.     . furosemide (LASIX) 40 MG tablet Take 40 mg by mouth daily.    Marland Kitchen HYDROcodone-acetaminophen (NORCO/VICODIN) 5-325 MG tablet Take 1 tablet by mouth 3 (three) times daily.    Marland Kitchen lisinopril (PRINIVIL,ZESTRIL) 2.5  MG tablet Take 2.5 mg by mouth daily.    Marland Kitchen loratadine (CLARITIN) 10 MG tablet Take 10 mg by mouth daily.    Marland Kitchen NAPROXEN DR 500 MG EC tablet Take 500 mg by mouth 2 (two) times daily.    . potassium chloride (K-DUR,KLOR-CON) 10 MEQ tablet Take 10 mEq by mouth 2 (two) times daily.    Marland Kitchen rOPINIRole (REQUIP) 2 MG tablet Take 2 mg by mouth 2 (two) times daily.    . baclofen (LIORESAL) 10 MG tablet Take 10 mg by mouth 2 (two) times daily.       Home: Home Living Family/patient expects to be discharged to:: Inpatient rehab Living Arrangements: Spouse/significant other Available Help at Discharge: Family, Available PRN/intermittently (husband) Type of Home: House Home Access: Stairs to enter Secretary/administrator of Steps: 3 Entrance Stairs-Rails: Can reach both Home Layout: Two level, Bed/bath upstairs Alternate Level Stairs-Number of Steps: 13 Alternate Level Stairs-Rails: Can reach both Home Equipment: Walker - 4 wheels  Functional History: Prior Function Level of Independence: Independent with assistive device(s) Comments: Husband reports pt performs limited household amb mod indep with rollator secondary to chronic back pain; does not drive. Indep with ADLs. Mostly stays home. Pt used riding mower to [employer] 1 week before CVA (husband emptied it) Functional Status:  Mobility: Bed Mobility Overal bed mobility: Needs Assistance Bed Mobility: Rolling, Supine to Sit Rolling: Max assist, Total assist Supine to sit: +2 for physical assistance, Total assist Sit to supine: Total assist, +2 for physical assistance General bed mobility comments: max assist to roll to the right using LUE to grip bed rail, total assist to the left due to inability to use RUE to assist. +2 total assist to rotate trunk and elevate to upright. Patient with heavy right lateral lean, increased assist to maintain upright position at EOB.  Transfers Overall transfer level: Needs assistance Equipment used:  (2 person  face to face with chuck pad) Transfers: Lateral/Scoot Transfers  Lateral/Scoot Transfers: Total assist, +2 physical assistance General transfer comment: Lateral scoot from bed to chair in 4 moves with gait belt and chuck pad      ADL: ADL Overall ADL's : Needs assistance/impaired General ADL Comments: Currently total A at bed level  Cognition: Cognition Overall Cognitive Status: Impaired/Different from baseline Arousal/Alertness: Lethargic Orientation Level: Oriented to person Attention: Sustained, Focused Focused Attention: Appears intact Sustained Attention: Impaired Sustained Attention Impairment: Verbal basic, Functional basic Cognition Arousal/Alertness: Lethargic (aroused when upright) Behavior During Therapy: Flat affect Overall Cognitive Status: Impaired/Different from baseline Area of Impairment: Attention, Following commands, Safety/judgement Orientation Level: Disoriented to, Person, Place, Time, Situation Current Attention Level: Focused Following Commands: Follows one step commands inconsistently Safety/Judgement: Decreased awareness of safety, Decreased awareness of deficits (pushing with LUE towards her weak side) Awareness: Intellectual General Comments: Pt extremely lethargic throughout session, requiring max cues to participate with therapy and only occasionaly opening eyes briefly. Nods yes/no appropriately <25% of time; follows simple commands with multimodal cues <50% of time. Potential inattention towards R-side; pt moving RLE in bed, but unable to do so when asked in sitting.  Difficult to assess due to: Impaired communication, Level of arousal (Unintelligible)  Blood pressure 119/68, pulse 69,  temperature 97.7 F (36.5 C), temperature source Oral, resp. rate 17, height  (1.676 m), weight 101.5 kg (223 lb 12.3 oz), SpO2 96 %. Physical Exam  Vitals reviewed. HENT:  Mild right facial droop  Eyes:  Pupils sluggish but reactive to light  Neck: Normal  range of motion. Neck supple. No thyromegaly present.  Cardiovascular: Normal rate and regular rhythm.   Respiratory:  Limited inspiratory effort but clear to auscultation  GI: Soft. Bowel sounds are normal. She exhibits no distension.  Neurological:  Patient is lethargic but arousable however she would fall back asleep during exam. She was able to provide her name but needed ongoing cues for date of birth and age. She did follow minimal commands with exam limited due to her lethargy  Skin: Skin is warm and dry.  . Difficult to assess because she is crying with pain. She will briefly stop to allow examination of heart, lungs. She cries in pain when going from right side-lying to supine. Also, when I start moving the right upper extremity, however, then she relaxes and allows me to range her right upper extremity. Right upper extremity strength is 0/5. Right lower extremity strength 0/5. Right upper extremity tone, Ashworth grade 3 at the biceps and the finger flexors. Left upper extremity tone is normal. Right lower extremity tone with flexor withdrawal  Results for orders placed or performed during the hospital encounter of 07/24/16 (from the past 24 hour(s))  Glucose, capillary     Status: Abnormal   Collection Time: 07/30/16 11:47 AM  Result Value Ref Range   Glucose-Capillary 110 (H) 65 - 99 mg/dL  Glucose, capillary     Status: Abnormal   Collection Time: 07/30/16  4:43 PM  Result Value Ref Range   Glucose-Capillary 114 (H) 65 - 99 mg/dL  Glucose, capillary     Status: Abnormal   Collection Time: 07/30/16  8:14 PM  Result Value Ref Range   Glucose-Capillary 114 (H) 65 - 99 mg/dL  Glucose, capillary     Status: Abnormal   Collection Time: 07/30/16 11:30 PM  Result Value Ref Range   Glucose-Capillary 107 (H) 65 - 99 mg/dL   Comment 1 Notify RN   Glucose, capillary     Status: Abnormal   Collection Time: 07/31/16  3:57 AM  Result Value Ref Range   Glucose-Capillary 106 (H) 65  - 99 mg/dL  Glucose, capillary     Status: None   Collection Time: 07/31/16  8:20 AM  Result Value Ref Range   Glucose-Capillary 99 65 - 99 mg/dL   Dg Abd Portable 1v  Result Date: 07/29/2016 CLINICAL DATA:  Check feeding catheter placement EXAM: PORTABLE ABDOMEN - 1 VIEW COMPARISON:  07/27/2016 FINDINGS: Feeding catheter is again noted with its tip in the distal stomach. This is similar to that seen on the prior exam. Nonobstructive bowel gas pattern is noted. IMPRESSION: Feeding catheter in the distal stomach. Electronically Signed   By: Alcide Clever M.D.   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 Does the need for close, 24 hr/day medical supervision in concert with the patient's rehab needs make it unreasonable for this patient to be served in a less intensive setting? Potentially 2. Co-Morbidities requiring supervision/potential complications: Chronic low back pain, dysphagia, hypertension 3. Due to bladder management, bowel management, safety, skin/wound care, disease management, medication administration, pain management and patient education, does the patient require 24 hr/day  rehab nursing? Yes 4. Does the patient require coordinated care of a physician, rehab nurse, PT, OT, SLP to address physical and functional deficits in the context of the above medical diagnosis(es)? Potentially Addressing deficits in the following areas: balance, endurance, locomotion, strength, transferring, bowel/bladder control, bathing, dressing, feeding, grooming, toileting, cognition, speech, language, swallowing and psychosocial support 5. Can the patient actively participate in an intensive therapy program of at least 3 hrs of therapy per day at least 5 days per week? No 6. The potential for patient to make measurable gains while on inpatient rehab is poor 7. Anticipated functional outcomes upon  discharge from inpatient rehab are n/a  with PT, n/a with OT, n/a with SLP. 8. Estimated rehab length of stay to reach the above functional goals is: NA 9. Does the patient have adequate social supports and living environment to accommodate these discharge functional goals? Yes 10. Anticipated D/C setting: Home 11. Anticipated post D/C treatments: HH therapy 12. Overall Rehab/Functional Prognosis: fair  RECOMMENDATIONS: This patient's condition is appropriate for continued rehabilitative care in the following setting: Currently appropriate for SNF, if able to participate and progress with in-hospital PT, OT, then reconsider inpatient rehabilitation Patient has agreed to participate in recommended program. N/A Note that insurance prior authorization may be required for reimbursement for recommended care.  Comment: Discussed with patient and husband.  Also have noted PT rec SNF  Erick Colace M.D. [facility] FAAPM&R (Sports Med, Neuromuscular Med) Diplomate Am Board of Electrodiagnostic Med  Charlton Amor., PA-C 07/31/2016

## 2016-07-31 NOTE — Progress Notes (Addendum)
2:30pm Husband called CSW back and would like [facility]- confirmed with facility they will have bed for patient tomorrow  Ethlyn Gallery started  1:30pm CSW spoke with husband- no facility choice at this time.  CSW updated spouse that she will likely be [facility] tomorrow  CSW to follow up with spouse tomorrow morning for final decision  Burna Sis, LCSW Clinical Social Worker [phone]

## 2016-07-31 NOTE — Progress Notes (Signed)
Nutrition Follow Up  DOCUMENTATION CODES:   Obesity unspecified  INTERVENTION:    Magic cup TID with meals, each supplement provides 290 kcal and 9 grams of protein  NEW NUTRITION DIAGNOSIS:   Increased nutrient needs related to acute illness as evidenced by estimated needs, ongoing  GOAL:   Patient will meet greater than or equal to 90% of their needs, progressing   MONITOR:   PO intake, Supplement acceptance, Labs, Weight trends, I & O's  ASSESSMENT:  61 y/o female PMHx HTN. Found on floor of garage with AMS. Evaluated and found to have L ICH.   Pt s/p bedside swallow evaluation 4/13.  SLP rec NPO status. CORTRAK small bore feeding tube placed 4/14.   Vital High Protein formula initiated at 60 ml/hr.  S/p FEES 4/16.  SLP rec continued NPO status. Advanced to Dys 2-nectar thick liquid diet 4/17.  Pt pulled tube out. PO intake 50-75% per flowsheet records.  Will add nutrition dessert supplement to meal trays. Labs reviewed.  Potassium 3.4 (L). CBG's [id number].  Diet Order:  DIET DYS 2 Room service appropriate? Yes; Fluid consistency: Nectar Thick  Skin: abrasion to L/R leg  Last BM:  4/17  Height:  Ht Readings from Last 1 Encounters:  07/24/16  (1.676 m)   Weight:  Wt Readings from Last 1 Encounters:  07/31/16 223 lb 12.3 oz (101.5 kg)   BMI:  Body mass index is 36.12 kg/m.  Estimated Nutritional Needs:   Kcal:  1800-2000   Protein:  90-100 gm   Fluid:  1.8-2.0 L  EDUCATION NEEDS:   No education needs identified at this time  Maureen Chatters, RD, LDN Pager #: [phone] After-Hours Pager #: [phone]

## 2016-07-31 NOTE — Progress Notes (Signed)
STROKE TEAM PROGRESS NOTE   SUBJECTIVE (INTERVAL HISTORY) She  Is now on a diet but not eating a whole lot she appears more alert and speech is clear   OBJECTIVE Temp:  [97.7 F (36.5 C)-98.7 F (37.1 C)] 98.1 F (36.7 C) (04/18 1158) Pulse Rate:  [17-78] 74 (04/18 1300) Cardiac Rhythm: Normal sinus rhythm (04/18 0745) Resp:  [12-20] 14 (04/18 1300) BP: (119-171)/(68-91) 152/78 (04/18 1300) SpO2:  [95 %-100 %] 97 % (04/18 1300) Weight:  [223 lb 12.3 oz (101.5 kg)] 223 lb 12.3 oz (101.5 kg) (04/18 0500)  CBC:   Recent Labs Lab 07/24/16 1619  07/29/16 0231 07/30/16 0249  WBC 16.2*  < > 10.8* 10.4  NEUTROABS 13.4*  --   --   --   HGB 14.5  < > 15.7* 13.6  HCT 44.5  < > 47.8* 41.3  MCV 88.3  < > 86.9 87.5  PLT 239  < > 197 247  < > = values in this interval not displayed.  Basic Metabolic Panel:   Recent Labs Lab 07/28/16 0458 07/28/16 1816 07/29/16 0231 07/30/16 0249  NA 139  --  140 140  K 3.5  --  2.9* 3.4*  CL 108  --  106 109  CO2 23  --  25 20*  GLUCOSE 105*  --  92 104*  BUN 23*  --  9 12  CREATININE 0.69  --  0.59 0.66  CALCIUM 8.8*  --  9.2 9.1  MG 2.0 2.0  --   --   PHOS 3.8 3.4  --   --     Lipid Panel:     Component Value Date/Time   CHOL 195 07/25/2016 0226   TRIG 107 07/25/2016 0226   HDL 65 07/25/2016 0226   CHOLHDL 3.0 07/25/2016 0226   VLDL 21 07/25/2016 0226   LDLCALC 109 (H) 07/25/2016 0226   HgbA1c:  Lab Results  Component Value Date   HGBA1C 5.2 07/25/2016   Urine Drug Screen:     Component Value Date/Time   LABOPIA POSITIVE (A) 07/26/2016 1007   COCAINSCRNUR NONE DETECTED 07/26/2016 1007   LABBENZ NONE DETECTED 07/26/2016 1007   AMPHETMU NONE DETECTED 07/26/2016 1007   THCU NONE DETECTED 07/26/2016 1007   LABBARB NONE DETECTED 07/26/2016 1007    Alcohol Level No results found for: ETH    IMAGING No results found.    PHYSICAL EXAM General - Well nourished, well developed, mild distress due to back pain and restless  legs  Ophthalmologic - Fundi not visualized due to noncooperation.  Cardiovascular - Regular rate and rhythm.  Neuro - awake alert. Oriented to place, year and people, but not age and month.minimally dyasrthric speech. Able to following central and peripheral simple commands. Eyes attending to both sides, but not blinking to visual threat on the right, PERRL, right facial droop, tongue protrusion to the right. LUE 5/5, RUE minor withdrawal on pain stimulation. BLE 3/5 on pain. Sensation, coordination not cooperative, and gait not tested.   ASSESSMENT/PLAN [name] is a 61 y.o. female with history of arthritis, chronic pain, HTN and kidney stones found down with R hemiparesis. CT showed a L basal ganglia hemorrhage.   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 Mild edema w/ 2mm L to R shift. 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  CT repeat am unchanged hemorrhage, shift now 3mm  CT  repeat 07/26/16 no change of ICH and midline shift  CTA canceled, not tolerating, consider once stable MRI 1. Acute intra-axial hemorrhage in the left lentiform with estimated blood volume of 26 mL has not significantly changed since 07/24/2016. Surrounding edema and regional mass effect including partial effacement of the left lateral ventricle, but no intraventricular hemorrhage, extra-axial hemorrhage, or  ventriculomegaly. MRA brain Negative intracranial MRA aside from intracranial artery  tortuosity  2D Echo EF 55-60% It should be repeated when the patient is able to cooperate.  LDL 109  HgbA1c 5.2  Heparin subq for VTE prophylaxis DIET DYS 2 Room service appropriate? Yes; Fluid consistency: Nectar Thick  No antithrombotic prior to admission  Ongoing aggressive stroke risk factor management  Therapy recommendations:  Rehab Admissions Coordinator following. Not yet ready for MD  consult.  Disposition:  pending   Hypertensive emergency  Blood pressure as high as 181/104, 205/85 in setting of neurologic symptoms   off Cleviprex   SBP goal < 140  Now on Lisinopril per tube and prn Labetalol.  BP 122/86  Hyperlipidemia  Home meds:  No statin.   LDL 109  No need to initiate statin at this time  Dysphagia  Did not pass swallow - -dysphagia 1 diet  Speech following   Other Stroke Risk Factors  Former Cigarette smoker , quit 4 years ago   Urine drug screen positive for opiates and benzos  Obesity, Body mass index is 36.12 kg/m., recommend weight loss, diet and exercise as appropriate   Other Active Problems  Leukocytosis, WBC 17.0->12.7->11.3 -> 9.9  LBP - morphine PRN  Hypokalemia   replalce with potassium  Hospital day # 7   I counseled the patient to eat more in order to improve her strength and to participate with therapy. Discontinue feeding tube. Hopefully transfer to rehabilitation facility over the next few days Greater than 50% time during this 25 minute visit was spent on counseling patient about her dysphagia and coordination of care. Discussed with patient and her bedside nurse  Delia Heady, MD Medical Director [facility] Pager: [phone] 07/31/2016 2:14 PM   To contact Stroke Continuity provider, please refer to [email or web address]. After hours, contact General Neurology

## 2016-07-31 NOTE — Progress Notes (Signed)
Physical Therapy Treatment Patient Details Name: Heidi Green MRN: [id number] DOB: Jan 07, 1956 Today's Date: 07/31/2016    History of Present Illness Pt is a 61 y.o. female admitted to ED on 07/24/16 after being found down with altered mental status; CT showed L basal ganglia centered intraparenchymal hematoma. Pertinent PMH include HTN, chronic severe back pain, arthritis.     PT Comments    Patient seen for activity progression. Patient able to utilize LUE and bilateral LEs for some aspects of movement but continues to require +2 moderate to max assist. Patient with extremely limited activity tolerance. A this time, do not feel patient will tolerate extensive 3 hours of therapies, will update discharge recommendation to SNF upon acute discharge. Will follow acutely and progress as tolerated.    Follow Up Recommendations  SNF;Supervision/Assistance - 24 hour     Equipment Recommendations  Other (comment) (TBD)    Recommendations for Other Services Rehab consult;OT consult     Precautions / Restrictions Precautions Precautions: Fall Restrictions Weight Bearing Restrictions: No    Mobility  Bed Mobility Overal bed mobility: Needs Assistance Bed Mobility: Rolling;Sit to Supine Rolling: Mod assist;Max assist     Sit to supine: Total assist;+2 for physical assistance   General bed mobility comments: moderate assist rolling to the left with use of LUE pulling on rail and BLEs, max assist to the right today limited by fatigue and pain  Transfers Overall transfer level: Needs assistance Equipment used:  (2 person face to face with chuck pad) Transfers: Lateral/Scoot Transfers          Lateral/Scoot Transfers: Max assist;+2 physical assistance General transfer comment: patient able to use LUE and BLEs to assist with bridging and pulling to lateral transfer. Lateral scoot from chair to bed  in 4 moves with and chuck pad  Ambulation/Gait                 Stairs            Wheelchair Mobility    Modified Rankin (Stroke Patients Only) Modified Rankin (Stroke Patients Only) Pre-Morbid Rankin Score: No symptoms Modified Rankin: Severe disability     Balance Overall balance assessment: Needs assistance Sitting-balance support: Single extremity supported;Feet supported Sitting balance-Leahy Scale: Zero Sitting balance - Comments: Patient continues to require max to total assist, attempted to faciliate midline with left lean on elbow but unable to maintain. Patient continues to have difficulty with midline approximation  Postural control: Right lateral lean;Posterior lean                                  Cognition Arousal/Alertness: Lethargic Behavior During Therapy: Anxious;Flat affect Overall Cognitive Status: Impaired/Different from baseline Area of Impairment: Attention;Following commands;Safety/judgement                 Orientation Level: Disoriented to;Person;Place;Time;Situation Current Attention Level: Focused   Following Commands: Follows one step commands inconsistently       General Comments: patient with difficulty staying on task to follow commands due to pain and decreased activity tolerance.       Exercises      General Comments        Pertinent Vitals/Pain Pain Assessment: Faces Faces Pain Scale: Hurts even more Pain Location: back, leg and head Pain Descriptors / Indicators: Aching;Grimacing;Guarding;Moaning Pain Intervention(s): Monitored during session    Home Living  Prior Function            PT Goals (current goals can now be found in the care plan section) Acute Rehab PT Goals PT Goal Formulation: Patient unable to participate in goal setting Time For Goal Achievement: 08/09/16 Progress towards PT goals: Progressing toward goals    Frequency    Min 3X/week      PT Plan Discharge plan needs to be updated    Co-evaluation             End  of Session Equipment Utilized During Treatment: Gait belt Activity Tolerance: Patient limited by fatigue Patient left: in bed;with call bell/phone within reach;with bed alarm set Nurse Communication: Mobility status PT Visit Diagnosis: Other abnormalities of gait and mobility (R26.89);Other symptoms and signs involving the nervous system (R29.898)     Time: 1020-1037 PT Time Calculation (min) (ACUTE ONLY): 17 min  Charges:  $Therapeutic Activity: 8-22 mins                    G Codes:       Charlotte Crumb, PT DPT  [phone]    Fabio Asa 07/31/2016, 10:41 AM

## 2016-08-01 DIAGNOSIS — I69191 Dysphagia following nontraumatic intracerebral hemorrhage: Secondary | ICD-10-CM

## 2016-08-01 DIAGNOSIS — E876 Hypokalemia: Secondary | ICD-10-CM

## 2016-08-01 DIAGNOSIS — Z91199 Patient's noncompliance with other medical treatment and regimen due to unspecified reason: Secondary | ICD-10-CM

## 2016-08-01 DIAGNOSIS — I161 Hypertensive emergency: Secondary | ICD-10-CM | POA: Diagnosis present

## 2016-08-01 DIAGNOSIS — Z9119 Patient's noncompliance with other medical treatment and regimen: Secondary | ICD-10-CM

## 2016-08-01 DIAGNOSIS — R451 Restlessness and agitation: Secondary | ICD-10-CM

## 2016-08-01 DIAGNOSIS — J309 Allergic rhinitis, unspecified: Secondary | ICD-10-CM

## 2016-08-01 LAB — GLUCOSE, CAPILLARY
GLUCOSE-CAPILLARY: 99 mg/dL (ref 65–99)
GLUCOSE-CAPILLARY: 91 mg/dL (ref 65–99)
Glucose-Capillary: 88 mg/dL (ref 65–99)

## 2016-08-01 LAB — BASIC METABOLIC PANEL
ANION GAP: 9 (ref 5–15)
BUN: 9 mg/dL (ref 6–20)
CALCIUM: 9.1 mg/dL (ref 8.9–10.3)
CO2: 22 mmol/L (ref 22–32)
Chloride: 107 mmol/L (ref 101–111)
Creatinine, Ser: 0.57 mg/dL (ref 0.44–1.00)
GFR calc Af Amer: >60 mL/min (ref >60)
GFR calc non Af Amer: >60 mL/min (ref >60)
GLUCOSE: 98 mg/dL (ref 65–99)
Potassium: 3 mmol/L — ABNORMAL LOW (ref 3.5–5.1)
Sodium: 138 mmol/L (ref 135–145)

## 2016-08-01 MED ORDER — HYDROCODONE-ACETAMINOPHEN 5-325 MG PO TABS: 1.0000 | ORAL_TABLET | Freq: Four times a day (QID) | ORAL | Status: DC | PRN

## 2016-08-01 MED ORDER — SENNOSIDES-DOCUSATE SODIUM 8.6-50 MG PO TABS: 1.0000 | ORAL_TABLET | Freq: Two times a day (BID) | ORAL | Status: DC

## 2016-08-01 MED ORDER — POTASSIUM CHLORIDE CRYS ER 10 MEQ PO TBCR
10.0000 meq | EXTENDED_RELEASE_TABLET | Freq: Two times a day (BID) | ORAL | Status: DC
  Administered 2016-08-01: 10 meq via ORAL
  Filled 2016-08-01: qty 1

## 2016-08-01 MED ORDER — HYDROCODONE-ACETAMINOPHEN 5-325 MG PO TABS: 1.0000 | ORAL_TABLET | Freq: Three times a day (TID) | ORAL | 0 refills | Status: DC

## 2016-08-01 MED ORDER — ACETAMINOPHEN 325 MG PO TABS: 650.0000 mg | ORAL_TABLET | ORAL | Status: DC | PRN

## 2016-08-01 MED ORDER — LISINOPRIL 10 MG PO TABS: 10.0000 mg | ORAL_TABLET | Freq: Every day | ORAL | Status: DC

## 2016-08-01 NOTE — Progress Notes (Signed)
Occupational Therapy Treatment Patient Details Name: Heidi Green MRN: [id number] DOB: [birth year] Today's Date: 08/01/2016    History of present illness Pt is a 61 y.o. female admitted to ED on 07/24/16 after being found down with altered mental status; CT showed L basal ganglia centered intraparenchymal hematoma. Pertinent PMH include HTN, chronic severe back pain, arthritis.    OT comments  Pt appearing and stating she was uncomfortable, but then resting easy after clean up and change of gown with repositioning on her L side. Pt able to participate in some oral care at bed level. Resistant to ROM of R UE and LEs. Changing recommendations to SNF for rehab as pt unable to tolerate 3 hours a day of activity necessary for CIR.  Follow Up Recommendations  SNF;Supervision/Assistance - 24 hour    Equipment Recommendations       Recommendations for Other Services      Precautions / Restrictions Precautions Precautions: Fall Restrictions Weight Bearing Restrictions: No       Mobility Bed Mobility Overal bed mobility: Needs Assistance Bed Mobility: Rolling Rolling: Mod assist;Max assist         General bed mobility comments: mod to roll L, max to R  Transfers                      Balance                                           ADL either performed or assessed with clinical judgement   ADL Overall ADL's : Needs assistance/impaired     Grooming: Wash/dry face;Oral care;Maximal assistance;Bed level           Upper Body Dressing : Total assistance;Bed level           Toileting- Clothing Manipulation and Hygiene: Total assistance;Bed level;+2 for safety/equipment Toileting - Clothing Manipulation Details (indicate cue type and reason): pt with incontinence of bowel and bladder             Vision       Perception     Praxis      Cognition Arousal/Alertness: Lethargic Behavior During Therapy: Flat affect;Agitated (gets agitated  with handling) Overall Cognitive Status: Impaired/Different from baseline Area of Impairment: Attention;Following commands;Safety/judgement                 Orientation Level: Disoriented to;Person;Place;Time;Situation Current Attention Level: Focused   Following Commands: Follows one step commands inconsistently Safety/Judgement: Decreased awareness of safety;Decreased awareness of deficits Awareness: Intellectual            Exercises     Shoulder Instructions       General Comments      Pertinent Vitals/ Pain       Pain Assessment: Faces Faces Pain Scale: Hurts little more Pain Location: R LE when extended Pain Descriptors / Indicators: Grimacing;Guarding;Moaning Pain Intervention(s): Monitored during session;Repositioned  Home Living Family/patient expects to be discharged to:: Skilled nursing facility                                        Prior Functioning/Environment              Frequency  Min 3X/week        Progress Toward Goals  OT Goals(current goals  can now be found in the care plan section)  Progress towards OT goals: Not progressing toward goals - comment (lethargy)  Acute Rehab OT Goals Patient Stated Goal: Unable to state goals OT Goal Formulation: Patient unable to participate in goal setting Time For Goal Achievement: 08/11/16 Potential to Achieve Goals: Fair  Plan Discharge plan needs to be updated    Co-evaluation                 End of Session    OT Visit Diagnosis: Other symptoms and signs involving the nervous system (R29.898);Cognitive communication deficit (R41.841);Hemiplegia and hemiparesis Symptoms and signs involving cognitive functions: Nontraumatic intracerebral hemorrhage Hemiplegia - Right/Left: Right Hemiplegia - dominant/non-dominant: Dominant Hemiplegia - caused by: Nontraumatic intracerebral hemorrhage   Activity Tolerance Patient limited by lethargy   Patient Left in bed;with  call bell/phone within reach   Nurse Communication Mobility status        Time: 1610-9604 OT Time Calculation (min): 24 min  Charges: OT General Charges $OT Visit: 1 Procedure OT Treatments $Self Care/Home Management : 23-37 mins     Evern Bio 08/01/2016, 12:15 PM  [phone]

## 2016-08-01 NOTE — Care Management Note (Signed)
Case Management Note  Patient Details  Name: [name] MRN: [id number] Date of Birth: [birth year]  Subjective/Objective:   Pt admitted on 07/24/16 with AMS and Lt intraparenchymal hemorrhage. Lives with spouse.  Per pt rec SNF, for dc to SNF today, CSW following.                 Action/Plan:   Expected Discharge Date:  08/01/16               Expected Discharge Plan:  Skilled Nursing Facility  In-House Referral:  Clinical Social Work  Discharge planning Services  CM Consult  Post Acute Care Choice:    Choice offered to:     DME Arranged:    DME Agency:     HH Arranged:    HH Agency:     Status of Service:  Completed, signed off  If discussed at [employer], dates discussed:    Additional Comments:  Leone Haven, RN 08/01/2016, 12:15 PM

## 2016-08-01 NOTE — Discharge Summary (Signed)
Stroke Discharge Summary  Patient ID: Heidi Green   MRN: [id number]      DOB: 23-Nov-1955  Date of Admission: 07/24/2016 Date of Discharge: 08/01/2016  Attending Physician:  Micki Riley, MD, Stroke MD Consultant(s):     Claudette Laws, MD (Physical Medicine & Rehabtilitation)  Patient's PCP:  Karle Plumber, MD  DISCHARGE DIAGNOSIS:  Principal Problem:   ICH (intracerebral hemorrhage) (HCC) - L basal ganglie d/t the HTN Active Problems:   HTN (hypertension)   Dyslipidemia   Back pain   Acute encephalopathy   Class 2 obesity   Hypertensive emergency   Dysphagia following nontraumatic intracerebral hemorrhage   Hypokalemia   Agitation   Non-compliance   Allergic rhinitis  BMI: Body mass index is 36.12 kg/m.  Past Medical History:  Diagnosis Date  . Arthritis    in back  . Back pain   . Chest pain 06/16/2012   negative cath with normal LV function  . Headache(784.0)   . Hypertension   . Kidney stones    Past Surgical History:  Procedure Laterality Date  . ABDOMINAL HYSTERECTOMY    . BACK SURGERY    . LEFT HEART CATHETERIZATION WITH CORONARY ANGIOGRAM N/A 06/16/2012   Procedure: LEFT HEART CATHETERIZATION WITH CORONARY ANGIOGRAM;  Surgeon: Peter M Swazil[place], MD;  Location: [facility] CATH LAB;  Service: Cardiovascular;  Laterality: N/A;  . LITHOTRIPSY    . TUBAL LIGATION      Allergies as of 08/01/2016      Reactions   Garamycin [gentamicin Sulfate] Hives, Shortness Of Breath   Penicillins Anaphylaxis, Itching, Swelling   Has patient had a PCN reaction causing immediate rash, facial/tongue/throat swelling, SOB or lightheadedness with hypotension: Yes Has patient had a PCN reaction causing severe rash involving mucus membranes or skin necrosis: No Has patient had a PCN reaction that required hospitalization No Has patient had a PCN reaction occurring within the last 10 years: No If all of the above answers are "NO", then may proceed with Cephalosporin use.   Norvasc  [amlodipine Besylate] Nausea And Vomiting      Medication List    STOP taking these medications   NAPROXEN DR 500 MG EC tablet Generic drug:  naproxen     TAKE these medications   acetaminophen 325 MG tablet Commonly known as:  TYLENOL Take 2 tablets (650 mg total) by mouth every 4 (four) hours as needed for mild pain (or temp > 37.5 C (99.5 F)). Combination of acetaminophen in vicodin and tylenol should not exceed max dose of  daily   baclofen 10 MG tablet Commonly known as:  LIORESAL Take 10 mg by mouth 2 (two) times daily.   cyanocobalamin 1000 MCG/ML injection Commonly known as:  (VITAMIN B-12) Inject 1,000 mcg into the muscle every 30 (thirty) days.   fluticasone 50 MCG/ACT nasal spray Commonly known as:  FLONASE Place 2 sprays into both nostrils daily.   furosemide 40 MG tablet Commonly known as:  LASIX Take 40 mg by mouth daily.   HYDROcodone-acetaminophen 5-325 MG tablet Commonly known as:  NORCO/VICODIN Take 1 tablet by mouth 3 (three) times daily.   lisinopril 10 MG tablet Commonly known as:  PRINIVIL,ZESTRIL Take 1 tablet (10 mg total) by mouth daily. Start taking on:  08/02/2016 What changed:  medication strength  how much to take   loratadine 10 MG tablet Commonly known as:  CLARITIN Take 10 mg by mouth daily.   potassium chloride 10 MEQ tablet Commonly known  as:  K-DUR,KLOR-CON Take 10 mEq by mouth 2 (two) times daily.   rOPINIRole 2 MG tablet Commonly known as:  REQUIP Take 2 mg by mouth 2 (two) times daily.   senna-docusate 8.6-50 MG tablet Commonly known as:  Senokot-S Take 1 tablet by mouth 2 (two) times daily.       LABORATORY STUDIES CBC    Component Value Date/Time   WBC 10.4 07/30/2016 0249   RBC 4.72 07/30/2016 0249   HGB 13.6 07/30/2016 0249   HCT 41.3 07/30/2016 0249   PLT 247 07/30/2016 0249   MCV 87.5 07/30/2016 0249   MCH 28.8 07/30/2016 0249   MCHC 32.9 07/30/2016 0249   RDW 14.7 07/30/2016 0249   LYMPHSABS  2.2 07/24/2016 1619   MONOABS 0.6 07/24/2016 1619   EOSABS 0.0 07/24/2016 1619   BASOSABS 0.0 07/24/2016 1619   CMP    Component Value Date/Time   NA 138 08/01/2016 0250   K 3.0 (L) 08/01/2016 0250   CL 107 08/01/2016 0250   CO2 22 08/01/2016 0250   GLUCOSE 98 08/01/2016 0250   BUN 9 08/01/2016 0250   CREATININE 0.57 08/01/2016 0250   CALCIUM 9.1 08/01/2016 0250   PROT 7.3 07/24/2016 1619   ALBUMIN 4.3 07/24/2016 1619   AST 30 07/24/2016 1619   ALT 17 07/24/2016 1619   ALKPHOS 97 07/24/2016 1619   BILITOT 0.6 07/24/2016 1619   GFRNONAA >60 08/01/2016 0250   GFRAA >60 08/01/2016 0250   COAGS Lab Results  Component Value Date   INR 1.05 07/24/2016   INR 1.05 06/16/2012   Lipid Panel    Component Value Date/Time   CHOL 195 07/25/2016 0226   TRIG 107 07/25/2016 0226   HDL 65 07/25/2016 0226   CHOLHDL 3.0 07/25/2016 0226   VLDL 21 07/25/2016 0226   LDLCALC 109 (H) 07/25/2016 0226   HgbA1C  Lab Results  Component Value Date   HGBA1C 5.2 07/25/2016   Urinalysis    Component Value Date/Time   COLORURINE YELLOW 07/26/2016 1007   APPEARANCEUR TURBID (A) 07/26/2016 1007   LABSPEC 1.020 07/26/2016 1007   PHURINE 5.5 07/26/2016 1007   GLUCOSEU NEGATIVE 07/26/2016 1007   HGBUR NEGATIVE 07/26/2016 1007   BILIRUBINUR NEGATIVE 07/26/2016 1007   KETONESUR 15 (A) 07/26/2016 1007   PROTEINUR NEGATIVE 07/26/2016 1007   UROBILINOGEN 1.0 02/28/2008 1445   NITRITE NEGATIVE 07/26/2016 1007   LEUKOCYTESUR SMALL (A) 07/26/2016 1007   Urine Drug Screen     Component Value Date/Time   LABOPIA POSITIVE (A) 07/26/2016 1007   COCAINSCRNUR NONE DETECTED 07/26/2016 1007   LABBENZ NONE DETECTED 07/26/2016 1007   AMPHETMU NONE DETECTED 07/26/2016 1007   THCU NONE DETECTED 07/26/2016 1007   LABBARB NONE DETECTED 07/26/2016 1007    Alcohol Level No results found for: [facility]   SIGNIFICANT DIAGNOSTIC STUDIES Ct Head Code Stroke Wo Contrast 07/24/2016 1616  1. Intraparenchymal  hemorrhage in the left basal ganglia measuring 4.8 x 2.7 x 3.5 cm, volume 24 cc. Mild surrounding edema. Mild mass effect with left-to-right shift of 2 mm. Old right frontal infarction.   Ct Head Wo Contrast 07/25/2016 0538 1. Motion degraded examination.  2. Unchanged size of intraparenchymal hematoma centered in the left basal ganglia with unchanged rightward midline shift measuring 3 mm.   Ct Head Wo Contrast 07/26/2016 1. Unchanged size of left basal ganglia centered intraparenchymal hematoma. 2. Slightly increased mass effect with midline shift now measuring 4 mm to the right. 3. No new area of  hemorrhage.  No hydrocephalus.   MRI & MRA head  07/28/2016 1. Acute intra-axial hemorrhage in the left lentiform with estimated blood volume of 26 mL has not significantly changed since 07/24/2016. Surrounding edema and regional mass effect including partial effacement of the left lateral ventricle, but no intraventricular hemorrhage, extra-axial hemorrhage, or ventriculomegaly. 2. Negative intracranial MRA aside from intracranial artery tortuosity. 3. Moderate to severe for age nonspecific cerebral white matter signal changes. Top differential considerations include chronic small vessel disease and chronic demyelinating disease.  Ct Cervical Spine Wo Contrast 07/24/2016 Mild degenerative change without acute abnormality.   2D Echocardiogram  - Left ventricle: The cavity size was normal. There was mildconcentric hypertrophy. Systolic function was normal. Theestimated ejection fraction was in the range of 55% to 60%. Thestudy is not technically sufficient to allow evaluation of LVdiastolic function. Impressions:   Limited, poor technical quality study. It should be repeated whenthe patient is able to cooperate.  Hip xray 07/24/2016 No acute fracture or dislocation is identified. If symptoms persist or if clinically indicated MRI is more sensitive for acute hip fracture.  Knee  xray 07/24/2016 1.  No fracture or dislocation. 2. Osteoarthritis of the lateral compartment.     HISTORY OF PRESENT ILLNESS LINDSAY STRAKA an 61 y.o.femalewho was last seen normal by her husband at 930 this AM 07/24/2016 (LKW). He came home at 1500 hours and found her on the floor of the garage with AMS and not moving. She was brought to [facility] hospital as a code stroke and found to have a left intraparenchymal hemorrhage. She is holding her left knee in flexion due to pain. Likely has suffered a contusion or fracture to that leg. She was admitted to the neuro ICU for further evaluation and treatment.    HOSPITAL COURSE Ms. ARAYLA KRUSCHKE is a 61 y.o. female with history of arthritis, chronic pain, HTN and kidney stones found down with R hemiparesis. CT showed a L basal ganglia hemorrhage.   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 Mild edema w/ 2mm L to R shift. 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  CT repeat am unchanged hemorrhage, shift now 3mm  CT repeat 07/26/16 no change of ICH and midline shift  CTA canceled, not tolerating  MRI brain L lentiform hmg, 26 mL volume without change since 07/24/2016.   MRA brain Negative   2D Echo EF 55-60%, very poor quality.   LDL 109  HgbA1c 5.2  No antithrombotic prior to admission  Ongoing aggressive stroke risk factor management  Therapy recommendations:  SNF - [facility]. Husband works and is unable to provide 24/7 care at home  Disposition:  SNF  Hypertensive emergency  Blood pressure as high as 181/104, 205/85 in setting of neurologic symptoms   Treated with Cleviprex and prn labetalol   BP stabilizing, BP 113-171/81-88 past 24h (08/01/2016 @ 11:41 AM)   on Lisinopril 10 mg daily  BP goal normotensive  Monitor BP. Increase lisinopril or add additional agent as needed.  Hyperlipidemia  Home meds:  No statin.   LDL 109  Given  hemorrhage, will not initiate statin at this time  Dysphagia  Secondary to stroke  Treated with tube feedings in hospital   Tube removed 4/18  Tolerating DIET DYS 2 Room service appropriate? Yes; Fluid consistency: Nectar Thick   Need to encourage PO intake  Magic cup added tid w/ meals for  increased nutritional needs  Ongoing Speech therapy   Other Stroke Risk Factors  Former Cigarette smoker, quit 4 years ago   Urine drug screen positive for opiates and benzos  ETOH level not performed  Obesity, Body mass index is 36.12 kg/m. recommend weight loss, diet and exercise as appropriate   Other Active Problems  Leukocytosis, resolved  Chronic back pain - on baclofen, requip. treated with morphine and vicodin PRN. Naproxen stopped d/t hemorrhage. Would not resume  On lasix PTA. Did not receive in hospital. Discussed with Dr. Pearlean Brownie. To resume at discharge. SNF MD To monitor.  Hypokalemia, replaced in hospital. 3.0 day of discharge. Will add standing K at discharge. Will need to follow for adjustment  Agitation/non-compliance. Patient refused therapies, meds, food. Needs encouragement.   Allergic rhinitis on [facility] day # 8   DISCHARGE EXAM Blood pressure 107/68, pulse 78, temperature 98.2 F (36.8 C), temperature source Oral, resp. rate 19, height  (1.676 m), weight 101.5 kg (223 lb 12.3 oz), SpO2 93 %. General - Well nourished, well developed, mild distress due to back pain and restless legs Ophthalmologic - Fundi not visualized due to noncooperation. Cardiovascular - Regular rate and rhythm. Neuro - awake alert. Oriented to place, year and people, but not age and month.minimally dyasrthric speech. Able to following central and peripheral simple commands. Eyes attending to both sides, but not blinking to visual threat on the right, PERRL, right facial droop, tongue protrusion to the right. LUE 5/5, RUE minor withdrawal on pain stimulation. BLE 3/5 on  pain. Sensation, coordination not cooperative, and gait not tested.  Discharge Diet   DIET DYS 2 Room service appropriate? Yes; Fluid consistency: Nectar Thick liquids  DISCHARGE PLAN  Disposition:  Discharge to skilled nursing facility for ongoing PT, OT and ST.   BP goal normotensive. Monitor. Add agents as needed  Follow swallow. Encourage PO. Increase diet as appropriate.  Monitor fluid intake/dehydration. Resuming home lasix at discharge.  Monitor potassium. Standing replacement at discharge. Adjust as needed  Ongoing risk factor control by nursing home Physician at time of discharge  Follow-up MD in nursing home for medical care  Follow-up with Dr. Delia Heady, Stroke Clinic in 6 weeks, office to schedule an appointment.  60 minutes were spent preparing discharge.  Rhoderick Moody [facility] See Amion for Pager information 08/01/2016 11:41 AM  I have personally examined this patient, reviewed notes, independently viewed imaging studies, participated in medical decision making and plan of care.ROS completed by me personally and pertinent positives fully documented  I have made any additions or clarifications directly to the above note. Agree with note above.   Delia Heady, MD Medical Director [facility] Pager: [phone] 08/01/2016 1:46 PM

## 2016-08-01 NOTE — Progress Notes (Signed)
  Speech Language Pathology Treatment: Dysphagia  Patient Details Name: Heidi Green MRN: [id number] DOB: Oct 30, 1955 Today's Date: 08/01/2016 Time: 4098-1191 SLP Time Calculation (min) (ACUTE ONLY): 23 min  Assessment / Plan / Recommendation Clinical Impression  Pt is more compliant this am to therapy interventions during am meal. Pt allowed SLP to assist in repositioning her, she assisted by pulling herself with LUE. Pt consumed pudding and nectar thick liquids with mild anterior spillage and right buccal residuals while self feeding. She attempted eggs with less success, residuals were severe despite cueing. A liquid wash was helpful in assisting with oral transit. Pt was more recepetive to reasoning today about diet modifications and consumed a greater percentage of PO. Despite this, pt will be at high risk for dehydration and poor nutrition on current diet. She will need close f/u with SLP at next level of care.   HPI HPI: Ms. EPPIE BARHORST is a 61 y.o. female with history of arthritis, chronic pain, HTN and kidney stones found down with R hemiparesis. CT showed a L basal ganglia hemorrhage.       SLP Plan  Continue with current plan of care       Recommendations  Diet recommendations: Dysphagia 2 (fine chop);Nectar-thick liquid Liquids provided via: Cup;No straw Medication Administration: Whole meds with puree Supervision: Staff to assist with self feeding Compensations: Slow rate;Small sips/bites;Lingual sweep for clearance of pocketing;Monitor for anterior loss Postural Changes and/or Swallow Maneuvers: Seated upright 90 degrees                Oral Care Recommendations: Oral care BID Follow up Recommendations: Skilled Nursing facility SLP Visit Diagnosis: Dysphagia, oropharyngeal phase (R13.12) Plan: Continue with current plan of care       GO               [facility], MA CCC-SLP [phone]  Heidi Green 08/01/2016, 11:41 AM

## 2016-08-01 NOTE — Care Management Important Message (Signed)
Important Message  Patient Details  Name: Heidi Green MRN: [id number] Date of Birth: July 26, 1955   Medicare Important Message Given:  Yes    Kyla Balzarine 08/01/2016, 12:26 PM

## 2016-08-01 NOTE — Progress Notes (Addendum)
Inpatient Rehabilitation  Please refer to consult by Dr. Wynn Banker for full details.  Per chart review, patient approaching medical readiness and not yet demonstrating ability to participate in this level of therapies per PT.  Note team is pursuing SNF for post acute rehab.  Will continue to follow at a distance until discharge; however note limited bed availability.  Please call with questions.   Charlane Ferretti., CCC/SLP Admission Coordinator  [facility]  Cell [phone]

## 2016-08-01 NOTE — Plan of Care (Signed)
Problem: Coping: Goal: Ability to verbalize positive feelings about self will improve Outcome: Not Progressing Patient expressive aphasia at this time

## 2016-08-01 NOTE — Clinical Social Work Placement (Signed)
   CLINICAL SOCIAL WORK PLACEMENT  NOTE  Date:  08/01/2016  Patient Details  Name: Heidi Green MRN: [id number] Date of Birth: 05-Apr-1956  Clinical Social Work is seeking post-discharge placement for this patient at the Skilled  Nursing Facility level of care (*CSW will initial, date and re-position this form in  chart as items are completed):  Yes   Patient/family provided with [place] Clinical Social Work Department's list of facilities offering this level of care within the geographic area requested by the patient (or if unable, by the patient's family).  Yes   Patient/family informed of their freedom to choose among providers that offer the needed level of care, that participate in Medicare, Medicaid or managed care program needed by the patient, have an available bed and are willing to accept the patient.  Yes   Patient/family informed of [place]'s ownership interest in [facility] and [facility] as well as of the fact that they are under no obligation to receive care at these facilities.  PASRR submitted to EDS on 07/30/16     PASRR number received on 07/30/16     Existing PASRR number confirmed on       FL2 transmitted to all facilities in geographic area requested by pt/family on 07/30/16     FL2 transmitted to all facilities within larger geographic area on       Patient informed that his/her managed care company has contracts with or will negotiate with certain facilities, including the following:        Yes   Patient/family informed of bed offers received.  Patient chooses bed at [facility]     Physician recommends and patient chooses bed at      Patient to be transferred to [facility] on 08/01/16.  Patient to be transferred to facility by ptar     Patient family notified on 08/01/16 of transfer.  Name of family member notified:  Viviann Spare     PHYSICIAN Please sign FL2     Additional Comment:     _______________________________________________ Burna Sis, LCSW 08/01/2016, 1:35 PM

## 2016-08-01 NOTE — Progress Notes (Signed)
Patient will discharge to [facility] Anticipated discharge date: 4/19 Family notified: Viviann Spare, spouse Transportation by [facility]- scheduled for 2pm Report #: [phone]  CSW signing off.  Burna Sis, LCSW Clinical Social Worker [phone]

## 2016-08-01 NOTE — Progress Notes (Signed)
Report given to Fredonia Highland, Charity fundraiser at [facility].

## 2016-08-02 ENCOUNTER — Non-Acute Institutional Stay (SKILLED_NURSING_FACILITY): Payer: Medicare HMO | Admitting: Internal Medicine

## 2016-08-02 ENCOUNTER — Encounter: Payer: Self-pay | Admitting: Internal Medicine

## 2016-08-02 DIAGNOSIS — G8191 Hemiplegia, unspecified affecting right dominant side: Secondary | ICD-10-CM

## 2016-08-02 DIAGNOSIS — D72829 Elevated white blood cell count, unspecified: Secondary | ICD-10-CM

## 2016-08-02 DIAGNOSIS — R2681 Unsteadiness on feet: Secondary | ICD-10-CM | POA: Diagnosis not present

## 2016-08-02 DIAGNOSIS — I610 Nontraumatic intracerebral hemorrhage in hemisphere, subcortical: Secondary | ICD-10-CM | POA: Diagnosis not present

## 2016-08-02 DIAGNOSIS — I10 Essential (primary) hypertension: Secondary | ICD-10-CM

## 2016-08-02 DIAGNOSIS — E876 Hypokalemia: Secondary | ICD-10-CM

## 2016-08-02 DIAGNOSIS — G8929 Other chronic pain: Secondary | ICD-10-CM | POA: Diagnosis not present

## 2016-08-02 DIAGNOSIS — K5900 Constipation, unspecified: Secondary | ICD-10-CM

## 2016-08-02 DIAGNOSIS — I69322 Dysarthria following cerebral infarction: Secondary | ICD-10-CM

## 2016-08-02 DIAGNOSIS — R531 Weakness: Secondary | ICD-10-CM

## 2016-08-02 DIAGNOSIS — I69391 Dysphagia following cerebral infarction: Secondary | ICD-10-CM

## 2016-08-02 NOTE — Progress Notes (Signed)
LOCATION: [facility]  PCP: Karle Plumber, MD   Code Status: Full Code  Goals of care: Advanced Directive information Advanced Directives 07/25/2016  Does Patient Have a Medical Advance Directive? No  Would patient like information on creating a medical advance directive? No - Patient declined  Pre-existing out of facility DNR order (yellow form or pink MOST form) -       Extended Emergency Contact Information Primary Emergency Contact: Gierke,[name] Address: [address]          [place], [place] [address] [place] Phone: [phone] Relation: Spouse   Allergies  Allergen Reactions  . Garamycin [Gentamicin Sulfate] Hives and Shortness Of Breath  . Penicillins Anaphylaxis, Itching and Swelling    Has patient had a PCN reaction causing immediate rash, facial/tongue/throat swelling, SOB or lightheadedness with hypotension: Yes Has patient had a PCN reaction causing severe rash involving mucus membranes or skin necrosis: No Has patient had a PCN reaction that required hospitalization No Has patient had a PCN reaction occurring within the last 10 years: No If all of the above answers are "NO", then may proceed with Cephalosporin use.   Marland Kitchen Norvasc [Amlodipine Besylate] Nausea And Vomiting    Chief Complaint  Patient presents with  . New Admit To SNF    New Admission Visit      HPI:  Patient is a 61 y.o. female seen today for short term rehabilitation post hospital admission from 07/24/16-08/01/16 with altered mental state. She was diagnosed with left intraparenchymal hemorrhage with right upper extremity weakness and right facial droop. Her stroke was thought to be from hypertensive emergency and her antihypertensive were adjusted. She is seen in her room today. Per nursing she has elevated BP reading. She has PMH of HTN, chronic pain, arthritis among others.   Review of Systems:  Constitutional: Negative for fever, chills. Low energy level.    HENT: Negative for headache, congestion, nasal discharge, difficulty swallowing.   Eyes: Negative for blurred vision, double vision and discharge.  Respiratory: Negative for cough, shortness of breath and wheezing.   Cardiovascular: Negative for chest pain, palpitation.  Gastrointestinal: Negative for heartburn, nausea, vomiting, abdominal pain. Last bowel movement was yesterday. Refusing to have breakfast this morning. Genitourinary: Negative for dysuria.  Musculoskeletal: Negative for fall in the facility and pain.  Skin: Negative for itching, rash.  Neurological: Negative for dizziness. Psychiatric/Behavioral: Negative for depression.   Past Medical History:  Diagnosis Date  . Arthritis    in back  . Back pain   . Chest pain 06/16/2012   negative cath with normal LV function  . Headache(784.0)   . Hypertension   . Kidney stones    Past Surgical History:  Procedure Laterality Date  . ABDOMINAL HYSTERECTOMY    . BACK SURGERY    . LEFT HEART CATHETERIZATION WITH CORONARY ANGIOGRAM N/A 06/16/2012   Procedure: LEFT HEART CATHETERIZATION WITH CORONARY ANGIOGRAM;  Surgeon: Peter M Swaziland, MD;  Location: [facility] CATH LAB;  Service: Cardiovascular;  Laterality: N/A;  . LITHOTRIPSY    . TUBAL LIGATION     Social History:   reports that she quit smoking about 4 years ago. Her smoking use included Cigarettes. She has a 13.00 pack-year smoking history. She has never used smokeless tobacco. She reports that she does not drink alcohol or use drugs.  Family History  Problem Relation Age of Onset  . Emphysema Mother   . Diabetes Mother     Medications: Allergies as  of 08/02/2016      Reactions   Garamycin [gentamicin Sulfate] Hives, Shortness Of Breath   Penicillins Anaphylaxis, Itching, Swelling   Has patient had a PCN reaction causing immediate rash, facial/tongue/throat swelling, SOB or lightheadedness with hypotension: Yes Has patient had a PCN reaction causing severe rash involving  mucus membranes or skin necrosis: No Has patient had a PCN reaction that required hospitalization No Has patient had a PCN reaction occurring within the last 10 years: No If all of the above answers are "NO", then may proceed with Cephalosporin use.   Norvasc [amlodipine Besylate] Nausea And Vomiting      Medication List       Accurate as of 08/02/16 10:51 AM. Always use your most recent med list.          acetaminophen 325 MG tablet Commonly known as:  TYLENOL Take 2 tablets (650 mg total) by mouth every 4 (four) hours as needed for mild pain (or temp > 37.5 C (99.5 F)). Combination of acetaminophen in vicodin and tylenol should not exceed max dose of 4000mg  daily   baclofen 10 MG tablet Commonly known as:  LIORESAL Take 10 mg by mouth 2 (two) times daily.   cyanocobalamin 1000 MCG/ML injection Commonly known as:  (VITAMIN B-12) Inject 1,000 mcg into the muscle every 30 (thirty) days.   fluticasone 50 MCG/ACT nasal spray Commonly known as:  FLONASE Place 2 sprays into both nostrils daily.   furosemide 40 MG tablet Commonly known as:  LASIX Take 40 mg by mouth daily.   HYDROcodone-acetaminophen 5-325 MG tablet Commonly known as:  NORCO/VICODIN Take 1 tablet by mouth 3 (three) times daily.   lisinopril 10 MG tablet Commonly known as:  PRINIVIL,ZESTRIL Take 1 tablet (10 mg total) by mouth daily.   loratadine 10 MG tablet Commonly known as:  CLARITIN Take 10 mg by mouth daily.   potassium chloride 10 MEQ tablet Commonly known as:  K-DUR,KLOR-CON Take 10 mEq by mouth 2 (two) times daily.   rOPINIRole 2 MG tablet Commonly known as:  REQUIP Take 2 mg by mouth 2 (two) times daily.   senna-docusate 8.6-50 MG tablet Commonly known as:  Senokot-S Take 1 tablet by mouth 2 (two) times daily.       Immunizations: Immunization History  Administered Date(s) Administered  . PPD Test 08/01/2016     Physical Exam: Vitals:   08/02/16 1047  BP: 129/73  Pulse: 78    Resp: 20  Temp: 98.6 F (37 C)  TempSrc: Oral  SpO2: 95%  Weight: 223 lb 11.2 oz (101.5 kg)  Height: 5\' 6"  (1.676 m)   Body mass index is 36.11 kg/m.  General- adult female, obese in no acute distress Head- normocephalic, atraumatic Nose- no nasal discharge Throat- moist mucus membrane Eyes- PERRLA, EOMI, no pallor, no icterus, no discharge, normal conjunctiva, normal sclera Neck- no cervical lymphadenopathy Cardiovascular- normal s1,s2, no murmur Respiratory- bilateral clear to auscultation, no wheeze, no rhonchi, no crackles, no use of accessory muscles Abdomen- bowel sounds present, soft, non tender, no guarding or rigidity Musculoskeletal- able to move all 4 extremities, generalized weakness most prominent to RUE strength 2/5, RLE 4/5, LUE and LLE 5/5, trace edema to her feet Neurological- alert and oriented to self and place but not to time, right facial droop Skin- warm and dry Psychiatry- somewhat flat affect    Labs reviewed: Basic Metabolic Panel:  Recent Labs  16/10/96 1622 07/28/16 0458 07/28/16 1816 07/29/16 0231 07/30/16 0249 08/01/16 0250  NA  --  139  --  140 140 138  K  --  3.5  --  2.9* 3.4* 3.0*  CL  --  108  --  106 109 107  CO2  --  23  --  25 20* 22  GLUCOSE  --  105*  --  92 104* 98  BUN  --  23*  --  CREATININE  --  0.69  --  0.59 0.66 0.57  CALCIUM  --  8.8*  --  9.2 9.1 9.1  MG 1.9 2.0 2.0  --   --   --   PHOS 3.8 3.8 3.4  --   --   --    Liver Function Tests:  Recent Labs  07/18/16 0345 07/18/16 1000 07/24/16 1619  AST ALT 13* 13* 17  ALKPHOS 101 93 97  BILITOT 0.5 0.8 0.6  PROT 7.1 7.8 7.3  ALBUMIN 3.9 4.3 4.3   No results for input(s): LIPASE, AMYLASE in the last 8760 hours.  Recent Labs  07/18/16 1000  AMMONIA 34   CBC:  Recent Labs  07/18/16 0345 07/18/16 1000  07/24/16 1619  07/28/16 0458 07/29/16 0231 07/30/16 0249  WBC 11.1* 13.3*  < > 16.2*  < > 9.9 10.8* 10.4  NEUTROABS 7.0 8.5*  --   13.4*  --   --   --   --   HGB 12.6 14.1  < > 14.5  < > 12.9 15.7* 13.6  HCT 38.9 43.0  < > 44.5  < > 38.8 47.8* 41.3  MCV 89.2 89.8  < > 88.3  < > 87.2 86.9 87.5  PLT 238 243  < > 239  < > 237 197 247  < > = values in this interval not displayed. Cardiac Enzymes: No results for input(s): CKTOTAL, CKMB, CKMBINDEX, TROPONINI in the last 8760 hours. BNP: Invalid input(s): POCBNP CBG:  Recent Labs  08/01/16 0334 08/01/16 0907 08/01/16 1225  GLUCAP 99 88 91    Radiological Exams: Dg Knee 2 Views Left  Result Date: 07/24/2016 CLINICAL DATA:  Fall.  Knee pain EXAM: LEFT KNEE - 1-2 VIEW COMPARISON:  None. FINDINGS: No fracture of the proximal tibia or distal femur. Patella is normal. No joint effusion. Degenerate spurring of the lateral compartment. IMPRESSION: 1.  No fracture or dislocation. 2. Osteoarthritis of the lateral compartment. Electronically Signed   By: Genevive Bi M.D.   On: 07/24/2016 18:31   Ct Head Wo Contrast  Result Date: 07/26/2016 CLINICAL DATA:  Altered mental status EXAM: CT HEAD WITHOUT CONTRAST TECHNIQUE: Contiguous axial images were obtained from the base of the skull through the vertex without intravenous contrast. COMPARISON:  Head CT 07/25/2016 FINDINGS: Brain: Intraparenchymal hematoma centered in the left basal ganglia is unchanged in size, measuring 5.3 x 2.4 cm. The degree of surrounding edema is also unchanged. No new areas of hemorrhage is seen. Rightward midline shift measures 4 mm, slightly increased. There is mass effect on left lateral ventricle. No hydrocephalus. Vascular: No hyperdense vessel or unexpected calcification. Skull: Normal. Negative for fracture or focal lesion. Sinuses/Orbits: No acute finding. Other: None. IMPRESSION: 1. Unchanged size of left basal ganglia centered intraparenchymal hematoma. 2. Slightly increased mass effect with midline shift now measuring 4 mm to the right. 3. No new area of hemorrhage.  No hydrocephalus.  Electronically Signed   By: Deatra Robinson M.D.   On: 07/26/2016 04:00   Ct Head Wo Contrast  Result Date: 07/25/2016 CLINICAL DATA:  Intracranial hemorrhage EXAM: CT HEAD WITHOUT CONTRAST TECHNIQUE: Contiguous axial images were obtained from the base of the skull through the vertex without intravenous contrast. COMPARISON:  head CT 07/24/2016 FINDINGS: Despite efforts by the technologist and patient, motion artifact is present on today's examination and could not be eliminated. The findings of this study are interpreted in the context of that limitation. Intraparenchymal hematoma centered in the left basal ganglia measures 5.2 x 2.4 cm, unchanged in size when allowing for differences in slice selection. The degree of surrounding edema is unchanged. Unchanged 3 mm of rightward midline shift. IMPRESSION: 1. Motion degraded examination. He ordered a CTA of the head and neck could not be performed and IV contrast was not administered. 2. Unchanged size of intraparenchymal hematoma centered in the left basal ganglia with unchanged rightward midline shift measuring 3 mm. Electronically Signed   By: Deatra Robinson M.D.   On: 07/25/2016 05:38   Ct Head Wo Contrast  Result Date: 07/18/2016 CLINICAL DATA:  Altered mental status.  Disoriented.  Combative. EXAM: CT HEAD WITHOUT CONTRAST TECHNIQUE: Contiguous axial images were obtained from the base of the skull through the vertex without intravenous contrast. COMPARISON:  None. FINDINGS: Brain: Motion degraded examination. Allowing for that, the brain appears normal without evidence of atrophy, mass, hemorrhage, hydrocephalus or extra-axial collection. One could question some chronic small-vessel change of the white matter. Sensitivity is limited. Vascular: There is atherosclerotic calcification of the major vessels at the base of the brain. Skull: Negative Sinuses/Orbits: Clear/normal Other: None significant IMPRESSION: Markedly motion degraded exam. No abnormality  visible by CT. This rules out any large intracranial hemorrhage, hydrocephalus or mass effect/shift. There may be some chronic small-vessel changes of the white matter, but the examination is quite limited. Electronically Signed   By: Paulina Fusi M.D.   On: 07/18/2016 07:11   Ct Cervical Spine Wo Contrast  Result Date: 07/24/2016 CLINICAL DATA:  Recent fall with known intracranial hemorrhage EXAM: CT CERVICAL SPINE WITHOUT CONTRAST TECHNIQUE: Multidetector CT imaging of the cervical spine was performed without intravenous contrast. Multiplanar CT image reconstructions were also generated. COMPARISON:  None. FINDINGS: Alignment: Normal. Skull base and vertebrae: 7 cervical segments are well visualized. Vertebral body height is well maintained. No acute fracture or acute facet abnormality is noted. Mild osteophytic changes are noted from C4-C7 predominately posteriorly. Soft tissues and spinal canal: No prevertebral fluid or swelling. No visible canal hematoma. Disc levels:  No specific disc pathology is noted. Upper chest: Within normal limits. IMPRESSION: Mild degenerative change without acute abnormality. Electronically Signed   By: Alcide Clever M.D.   On: 07/24/2016 16:23   Ct Lumbar Spine W Contrast  Result Date: 07/19/2016 CLINICAL DATA:  Back pain.  History of surgery.  Encephalopathy. EXAM: CT LUMBAR SPINE WITH CONTRAST TECHNIQUE: Multidetector CT imaging of the lumbar spine was performed with intravenous contrast administration. CONTRAST:  100 mL Isovue-300. COMPARISON:  Plain films 03/10/2016.  MRI lumbar spine 11/24/2003. FINDINGS: Segmentation: Standard Alignment: Degenerative scoliosis convex LEFT approximately 15 degrees centered at L3-L4. Vertebrae: There is arthrodesis across the L4-5 and L5-S1 interspaces. This may be postsurgical. No abnormal enhancement is seen within the spinal canal or paravertebral soft tissues. Paraspinal and other soft tissues: Renal cystic disease incompletely  evaluated. Vascular calcification. Splenic artery aneurysm. Aortic atherosclerosis with cross-sectional measurements of the infrarenal abdominal aorta 25 x 23 mm opposite L4. No hydronephrosis. Disc levels: L1-L2:  Unremarkable. L2-L3:  Unremarkable disc space.  Facet  arthropathy. L3-L4: Severe disc space narrowing on the RIGHT with large extraforaminal extrusion accompanied by osseous spurring. Facet arthropathy. LEFT laminotomy. BILATERAL L3 and L4 nerve root impingement are likely, worse on the RIGHT. L4-L5: Interbody arthrodesis. Posterior arthrodesis across the facets. Mild stenosis. No definite subarticular zone narrowing. RIGHT foraminal narrowing could affect the L4 nerve root. L5-S1: Solid interbody and posterior arthrodesis. RIGHT laminotomy. Bony overgrowth contributes to foraminal narrowing bilaterally which could affect either L5 nerve root. Standard IMPRESSION: Solid appearing L4-5 and L5-S1 fusion. Bony foraminal narrowing at these levels could affect the exiting L4 and L5 nerve roots. 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 Severe disc space narrowing, osseous spurring, and extraforaminal protrusion on the RIGHT. RIGHT greater than LEFT L3 and L4 nerve root impingement. Aortic atherosclerosis. Ectatic abdominal aorta at risk for aneurysm development. Recommend followup by ultrasound in 5 years. This recommendation follows ACR consensus guidelines: White Paper of the ACR Incidental Findings Committee II on Vascular Findings. J Am Coll Radiol 2013; 10:789-794. Electronically Signed   By: Elsie Stain M.D.   On: 07/19/2016 07:50   Mr Shirlee Latch JX Contrast  Result Date: 07/28/2016 CLINICAL DATA:  61 year old female with left basal ganglia hemorrhage discovered on 07/24/2016 during evaluation of altered mental status. EXAM: MRI HEAD WITHOUT CONTRAST MRA HEAD WITHOUT CONTRAST TECHNIQUE: Multiplanar, multiecho pulse sequences of the brain and surrounding structures were obtained without intravenous contrast.  Angiographic images of the head were obtained using MRA technique without contrast. COMPARISON:  Head CTs without contrast 07/26/2016 and earlier. FINDINGS: MRI HEAD FINDINGS Brain: Heterogeneous but mostly hyperintense T1 signal and dark T2 signal intra-axial hemorrhage centered at the left lentiform nuclei encompasses 53 x 25 x 39 mm (AP by transverse by CC), for an estimated blood volume of 26 mL. Size and configuration has not significantly changed since 07/24/2016. Surrounding edema. Regional mass effect with 45 mm of rightward midline shift. Mass effect on the left lateral ventricle, but no intraventricular extension of blood. No extra-axial extension identified. Basilar cisterns remain patent. No restricted diffusion to suggest acute infarction. No ventriculomegaly. Cervicomedullary junction and pituitary are within normal limits. In addition to the edema in the left hemisphere there is patchy in widespread bilateral cerebral white matter T2 and FLAIR hyperintensity in a nonspecific configuration. No cortical encephalomalacia identified. No definite other parenchymal blood products. Brainstem and cerebellum are within normal limits. Vascular: Major intracranial vascular flow voids are preserved, 0 with mild generalized intracranial artery dolichoectasia. Skull and upper cervical spine: Negative. Visualized bone marrow signal is within normal limits. Sinuses/Orbits: Negative orbits soft tissues. Trace paranasal sinus mucosal thickening. Other: Mastoids are clear. Visible internal auditory structures appear normal. Negative scalp soft tissues. MRA HEAD FINDINGS Antegrade flow in the posterior circulation with codominant distal vertebral arteries. Normal PICA origins. Tortuous but otherwise negative vertebrobasilar junction. Tortuous basilar artery without stenosis. Normal SCA and PCA origins. Posterior communicating arteries are diminutive or absent. Mildly tortuous left P1 segment. Bilateral PCA branches are  within normal limits. Posterior communicating arteries are diminutive or absent. Antegrade flow in both ICA siphons. Tortuous distal cervical right ICA. No siphon stenosis. Normal ophthalmic artery origins. Patent carotid termini. MCA and ACA origins are within normal limits. Mildly dominant right ACA A1 segment. Anterior communicating artery within normal limits. Dominant right ACA A2 segment. This somewhat simulates an azygos ACA anatomy. Mildly tortuous bilateral MCA M1 segments. No M1 stenosis. Bilateral MCA bifurcations and visible bilateral MCA branches are within normal limits. IMPRESSION: 1. Acute intra-axial hemorrhage in  the left lentiform with estimated blood volume of 26 mL has not significantly changed since 07/24/2016. Surrounding edema and regional mass effect including partial effacement of the left lateral ventricle, but no intraventricular hemorrhage, extra-axial hemorrhage, or ventriculomegaly. 2. Negative intracranial MRA aside from intracranial artery tortuosity. 3. Moderate to severe for age nonspecific cerebral white matter signal changes. Top differential considerations include chronic small vessel disease and chronic demyelinating disease. Electronically Signed   By: Odessa Fleming M.D.   On: 07/28/2016 19:50   Mr Brain Wo Contrast  Result Date: 07/28/2016 CLINICAL DATA:  61 year old female with left basal ganglia hemorrhage discovered on 07/24/2016 during evaluation of altered mental status. EXAM: MRI HEAD WITHOUT CONTRAST MRA HEAD WITHOUT CONTRAST TECHNIQUE: Multiplanar, multiecho pulse sequences of the brain and surrounding structures were obtained without intravenous contrast. Angiographic images of the head were obtained using MRA technique without contrast. COMPARISON:  Head CTs without contrast 07/26/2016 and earlier. FINDINGS: MRI HEAD FINDINGS Brain: Heterogeneous but mostly hyperintense T1 signal and dark T2 signal intra-axial hemorrhage centered at the left lentiform nuclei  encompasses 53 x 25 x 39 mm (AP by transverse by CC), for an estimated blood volume of 26 mL. Size and configuration has not significantly changed since 07/24/2016. Surrounding edema. Regional mass effect with 45 mm of rightward midline shift. Mass effect on the left lateral ventricle, but no intraventricular extension of blood. No extra-axial extension identified. Basilar cisterns remain patent. No restricted diffusion to suggest acute infarction. No ventriculomegaly. Cervicomedullary junction and pituitary are within normal limits. In addition to the edema in the left hemisphere there is patchy in widespread bilateral cerebral white matter T2 and FLAIR hyperintensity in a nonspecific configuration. No cortical encephalomalacia identified. No definite other parenchymal blood products. Brainstem and cerebellum are within normal limits. Vascular: Major intracranial vascular flow voids are preserved, 0 with mild generalized intracranial artery dolichoectasia. Skull and upper cervical spine: Negative. Visualized bone marrow signal is within normal limits. Sinuses/Orbits: Negative orbits soft tissues. Trace paranasal sinus mucosal thickening. Other: Mastoids are clear. Visible internal auditory structures appear normal. Negative scalp soft tissues. MRA HEAD FINDINGS Antegrade flow in the posterior circulation with codominant distal vertebral arteries. Normal PICA origins. Tortuous but otherwise negative vertebrobasilar junction. Tortuous basilar artery without stenosis. Normal SCA and PCA origins. Posterior communicating arteries are diminutive or absent. Mildly tortuous left P1 segment. Bilateral PCA branches are within normal limits. Posterior communicating arteries are diminutive or absent. Antegrade flow in both ICA siphons. Tortuous distal cervical right ICA. No siphon stenosis. Normal ophthalmic artery origins. Patent carotid termini. MCA and ACA origins are within normal limits. Mildly dominant right ACA A1  segment. Anterior communicating artery within normal limits. Dominant right ACA A2 segment. This somewhat simulates an azygos ACA anatomy. Mildly tortuous bilateral MCA M1 segments. No M1 stenosis. Bilateral MCA bifurcations and visible bilateral MCA branches are within normal limits. IMPRESSION: 1. Acute intra-axial hemorrhage in the left lentiform with estimated blood volume of 26 mL has not significantly changed since 07/24/2016. Surrounding edema and regional mass effect including partial effacement of the left lateral ventricle, but no intraventricular hemorrhage, extra-axial hemorrhage, or ventriculomegaly. 2. Negative intracranial MRA aside from intracranial artery tortuosity. 3. Moderate to severe for age nonspecific cerebral white matter signal changes. Top differential considerations include chronic small vessel disease and chronic demyelinating disease. Electronically Signed   By: Odessa Fleming M.D.   On: 07/28/2016 19:50   Dg Abd Portable 1v  Result Date: 07/29/2016 CLINICAL DATA:  Check feeding catheter  placement EXAM: PORTABLE ABDOMEN - 1 VIEW COMPARISON:  07/27/2016 FINDINGS: Feeding catheter is again noted with its tip in the distal stomach. This is similar to that seen on the prior exam. Nonobstructive bowel gas pattern is noted. IMPRESSION: Feeding catheter in the distal stomach. Electronically Signed   By: Alcide Clever M.D.   On: 07/29/2016 13:53   Dg Abd Portable 1v  Result Date: 07/27/2016 CLINICAL DATA:  Feeding tube placement EXAM: PORTABLE ABDOMEN - 1 VIEW COMPARISON:  None. FINDINGS: There is normal small bowel gas pattern. There is NG feeding tube with tip in distal stomach/pyloric region. IMPRESSION: NG feeding tube with tip in distal stomach/ pyloric region. Electronically Signed   By: Natasha Mead M.D.   On: 07/27/2016 11:21   Dg Hip Unilat W Or Wo Pelvis 2-3 Views Left  Result Date: 07/24/2016 CLINICAL DATA:  61 y/o F; status post fall with pain of either the left hip were left  knee. EXAM: DG HIP (WITH OR WITHOUT PELVIS) 2-3V LEFT COMPARISON:  None. FINDINGS: No acute fracture or dislocation is identified. If symptoms persist or if clinically indicated MRI is more sensitive for acute hip fracture. Vascular calcifications. Mild osteoarthrosis of sacroiliac joints and symphysis pubis. IMPRESSION: No acute fracture or dislocation is identified. If symptoms persist or if clinically indicated MRI is more sensitive for acute hip fracture. Electronically Signed   By: Mitzi Hansen M.D.   On: 07/24/2016 18:33   Ct Head Code Stroke Wo Contrast  Result Date: 07/24/2016 CLINICAL DATA:  Code stroke. Code stroke. Additional symptoms not described. Altered mental status. EXAM: CT HEAD WITHOUT CONTRAST TECHNIQUE: Contiguous axial images were obtained from the base of the skull through the vertex without intravenous contrast. COMPARISON:  07/18/2016 FINDINGS: Brain: The patient has developed intraparenchymal hematoma in the left basal ganglia measuring 4.8 x 3.5 x 2.7 cm (volume = 24 cm^3). Mild surrounding edema. Mild mass effect with left-to-right shift of 2 mm. 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 No intraventricular penetration. No subarachnoid penetration. No hydrocephalus or extra-axial collection. Vascular: There is atherosclerotic calcification of the major vessels at the base of the brain. Skull: Normal Sinuses/Orbits: Clear/normal Other: None ASPECTS (Alberta Stroke Program Early CT Score) - Ganglionic level infarction (caudate, lentiform nuclei, internal capsule, insula, M1-M3 cortex): 7 - Supraganglionic infarction (M4-M6 cortex): 3 Total score (0-10 with 10 being normal): 10 IMPRESSION: 1. Intraparenchymal hemorrhage in the left basal ganglia measuring 4.8 x 2.7 x 3.5 cm, volume 24 cc. Mild surrounding edema. Mild mass effect with left-to-right shift of 2 mm. Old right frontal infarction. 2. Page was placed by telephone at the time of interpretation on 07/24/2016 at 4:11 pm to Dr. Jonna Munro. 3. ASPECTS is 10 Electronically Signed   By: Paulina Fusi M.D.   On: 07/24/2016 16:16    Assessment/Plan  Generalized weakness From physical deconditioning. Will have patient work with PT/OT as tolerated to regain strength and restore function.  Fall precautions are in place.  Unsteady gait With right sided weakness post acute CVA. Will have her work with physical therapy and occupational therapy team to help with gait training and muscle strengthening exercises.fall precautions. Skin care. Encourage to be out of bed.   Left intraparenchymal hemorrhage With right sided weakness and dysarthria. To work with PT, OT and SLP to help with regaining strength, balance and cognition. Not on statin given recent hemorrhage.   Right hemiparesis Post cva, continue norco 5-325 mg tid for pain and get PMR consult. Continue baclofen as  below  Leukocytosis Resolved, monitor cbc  Dysphagia SLP consult, aspiration precautions and dysphagia diet- mechanical soft diet with nectar thick liquid  Dysarthria SLP consult for further evaluation  HTN Monitor BP reading. Currently on lisinopril 10 mg daily, lasix 40 mg daily and BP remains elevated. Increase lisinopril to 15 mg daily and monitor.   Hypokalemia With her on lasix, continue kcl supplement and check bmp  Chronic pain Get PMR to evaluate, continue norco 5-325 mg tid and baclofen 10 mg bid for muscle spasm  Constipation Currently on senna docusate, monitor   Goals of care: short term rehabilitation   Labs/tests ordered: cbc, cmp 08/05/16   Family/ staff Communication: reviewed care plan with patient and nursing supervisor    Oneal Grout, MD Internal Medicine [facility] [address] [place], [place] [address] Cell Phone (Monday-Friday 8 am - 5 pm): [phone] On Call: [phone] and follow prompts after 5 pm and on weekends Office Phone: [phone] Office Fax:  [phone]

## 2016-08-05 LAB — HEPATIC FUNCTION PANEL
ALK PHOS: 88 U/L (ref 25–125)
ALT: 29 U/L (ref 7–35)
AST: 22 U/L (ref 13–35)
Bilirubin, Total: 0.6 mg/dL

## 2016-08-05 LAB — BASIC METABOLIC PANEL
BUN: 36 mg/dL — AB (ref 4–21)
Creatinine: 0.6 mg/dL (ref 0.5–1.1)
Glucose: 104 mg/dL
Potassium: 3.8 mmol/L (ref 3.4–5.3)
SODIUM: 148 mmol/L — AB (ref 137–147)

## 2016-08-14 ENCOUNTER — Emergency Department (HOSPITAL_COMMUNITY)
Admission: EM | Admit: 2016-08-14 | Discharge: 2016-08-15 | Disposition: A | Discharge status: Home / Self Care | Payer: Medicare HMO | Attending: Emergency Medicine | Admitting: Emergency Medicine

## 2016-08-14 DIAGNOSIS — R404 Transient alteration of awareness: Secondary | ICD-10-CM | POA: Diagnosis not present

## 2016-08-14 DIAGNOSIS — G8191 Hemiplegia, unspecified affecting right dominant side: Secondary | ICD-10-CM

## 2016-08-14 DIAGNOSIS — F1120 Opioid dependence, uncomplicated: Secondary | ICD-10-CM | POA: Insufficient documentation

## 2016-08-14 DIAGNOSIS — R4182 Altered mental status, unspecified: Secondary | ICD-10-CM | POA: Diagnosis present

## 2016-08-14 DIAGNOSIS — Z79899 Other long term (current) drug therapy: Secondary | ICD-10-CM | POA: Insufficient documentation

## 2016-08-14 DIAGNOSIS — I252 Old myocardial infarction: Secondary | ICD-10-CM | POA: Diagnosis not present

## 2016-08-14 DIAGNOSIS — Z87891 Personal history of nicotine dependence: Secondary | ICD-10-CM | POA: Diagnosis not present

## 2016-08-14 DIAGNOSIS — Z955 Presence of coronary angioplasty implant and graft: Secondary | ICD-10-CM | POA: Diagnosis not present

## 2016-08-14 DIAGNOSIS — R791 Abnormal coagulation profile: Secondary | ICD-10-CM | POA: Insufficient documentation

## 2016-08-14 DIAGNOSIS — F1190 Opioid use, unspecified, uncomplicated: Secondary | ICD-10-CM

## 2016-08-14 DIAGNOSIS — I10 Essential (primary) hypertension: Secondary | ICD-10-CM | POA: Diagnosis not present

## 2016-08-14 DIAGNOSIS — R4701 Aphasia: Secondary | ICD-10-CM | POA: Diagnosis not present

## 2016-08-14 NOTE — ED Triage Notes (Signed)
Per EMS report, pt coming from [facility]. Pt sent for hypotension. Staff at facility reported that pt is altered past her normal and lethargic. Pt has aphasia and right sided paralysis. Staff reports pt has been in this state since before 2:30 pm today.

## 2016-08-14 NOTE — ED Notes (Signed)
Bed: VO53 Expected date:  Expected time:  Means of arrival:  Comments: 61 yr old hypotension, lethargic

## 2016-08-15 ENCOUNTER — Non-Acute Institutional Stay (SKILLED_NURSING_FACILITY): Payer: Medicare HMO | Admitting: Adult Health

## 2016-08-15 ENCOUNTER — Emergency Department (HOSPITAL_COMMUNITY): Payer: Medicare HMO

## 2016-08-15 ENCOUNTER — Encounter: Payer: Self-pay | Admitting: Adult Health

## 2016-08-15 DIAGNOSIS — I610 Nontraumatic intracerebral hemorrhage in hemisphere, subcortical: Secondary | ICD-10-CM

## 2016-08-15 DIAGNOSIS — G8929 Other chronic pain: Secondary | ICD-10-CM | POA: Diagnosis not present

## 2016-08-15 DIAGNOSIS — I952 Hypotension due to drugs: Secondary | ICD-10-CM

## 2016-08-15 DIAGNOSIS — G8191 Hemiplegia, unspecified affecting right dominant side: Secondary | ICD-10-CM | POA: Diagnosis not present

## 2016-08-15 LAB — COMPREHENSIVE METABOLIC PANEL
ALBUMIN: 4.3 g/dL (ref 3.5–5.0)
ALK PHOS: 90 U/L (ref 38–126)
ALT: 29 U/L (ref 14–54)
AST: 24 U/L (ref 15–41)
Anion gap: 10 (ref 5–15)
BILIRUBIN TOTAL: 0.8 mg/dL (ref 0.3–1.2)
BUN: 37 mg/dL — AB (ref 6–20)
CALCIUM: 9.6 mg/dL (ref 8.9–10.3)
CO2: 21 mmol/L — AB (ref 22–32)
CREATININE: 0.92 mg/dL (ref 0.44–1.00)
Chloride: 107 mmol/L (ref 101–111)
GFR calc Af Amer: >60 mL/min (ref >60)
GFR calc non Af Amer: >60 mL/min (ref >60)
Glucose, Bld: 113 mg/dL — ABNORMAL HIGH (ref 65–99)
Potassium: 4.1 mmol/L (ref 3.5–5.1)
SODIUM: 138 mmol/L (ref 135–145)
TOTAL PROTEIN: 7.7 g/dL (ref 6.5–8.1)

## 2016-08-15 LAB — CBC
HEMATOCRIT: 48.1 % — AB (ref 36.0–46.0)
HEMOGLOBIN: 16.2 g/dL — AB (ref 12.0–15.0)
MCH: 30.1 pg (ref 26.0–34.0)
MCHC: 33.7 g/dL (ref 30.0–36.0)
MCV: 89.4 fL (ref 78.0–100.0)
Platelets: 250 10*3/uL (ref 150–400)
RBC: 5.38 MIL/uL — AB (ref 3.87–5.11)
RDW: 15.3 % (ref 11.5–15.5)
WBC: 15.1 10*3/uL — ABNORMAL HIGH (ref 4.0–10.5)

## 2016-08-15 LAB — RAPID URINE DRUG SCREEN, HOSP PERFORMED
AMPHETAMINES: NOT DETECTED
BARBITURATES: NOT DETECTED
Benzodiazepines: NOT DETECTED
Cocaine: NOT DETECTED
OPIATES: POSITIVE — AB
TETRAHYDROCANNABINOL: NOT DETECTED

## 2016-08-15 LAB — PROTIME-INR
INR: 1.21
PROTHROMBIN TIME: 15.4 s — AB (ref 11.4–15.2)

## 2016-08-15 LAB — DIFFERENTIAL
BASOS ABS: 0 10*3/uL (ref 0.0–0.1)
Basophils Relative: 0 %
EOS PCT: 3 %
Eosinophils Absolute: 0.5 10*3/uL (ref 0.0–0.7)
LYMPHS PCT: 34 %
Lymphs Abs: 5.1 10*3/uL — ABNORMAL HIGH (ref 0.7–4.0)
MONOS PCT: 6 %
Monocytes Absolute: 0.9 10*3/uL (ref 0.1–1.0)
NEUTROS PCT: 57 %
Neutro Abs: 8.6 10*3/uL — ABNORMAL HIGH (ref 1.7–7.7)

## 2016-08-15 LAB — URINALYSIS, ROUTINE W REFLEX MICROSCOPIC
Bilirubin Urine: NEGATIVE
Glucose, UA: NEGATIVE mg/dL
Hgb urine dipstick: NEGATIVE
Ketones, ur: 5 mg/dL — AB
Leukocytes, UA: NEGATIVE
NITRITE: NEGATIVE
PH: 5 (ref 5.0–8.0)
PROTEIN: NEGATIVE mg/dL
Specific Gravity, Urine: 1.019 (ref 1.005–1.030)

## 2016-08-15 LAB — APTT: APTT: 22 s — AB (ref 24–36)

## 2016-08-15 LAB — I-STAT TROPONIN, ED: Troponin i, poc: 0 ng/mL (ref 0.00–0.08)

## 2016-08-15 LAB — CBG MONITORING, ED: Glucose-Capillary: 102 mg/dL — ABNORMAL HIGH (ref 65–99)

## 2016-08-15 LAB — POC OCCULT BLOOD, ED: FECAL OCCULT BLD: NEGATIVE

## 2016-08-15 LAB — I-STAT CG4 LACTIC ACID, ED: Lactic Acid, Venous: 0.81 mmol/L (ref 0.5–1.9)

## 2016-08-15 LAB — ETHANOL

## 2016-08-15 LAB — AMMONIA: Ammonia: 21 umol/L (ref 9–35)

## 2016-08-15 NOTE — Progress Notes (Signed)
DATE:  08/15/2016   MRN:  [id number]  BIRTHDAY: 05/29/1955  Facility:  Nursing Home Location:  [facility]  Nursing Home Room Number: 1206-P  LEVEL OF CARE:  SNF (31)  Contact Information    Name Relation Home Work Mobile   Liszewski,[name] Spouse   [phone]       Code Status History    Date Active Date Inactive Code Status Order ID Comments User Context   07/24/2016  6:40 PM 08/01/2016  6:18 PM Full Code [id number]  Ulice DashDavid R Smith, PA-C Inpatient   07/18/2016  7:05 AM 07/19/2016  3:11 PM Full Code [id number]  Eduard ClosArshad N Kakrakandy, MD ED   06/16/2012  7:58 AM 06/17/2012  8:38 PM Full Code [id number]  Laveda Normanhris N Oti, MD Inpatient       Chief Complaint  Patient presents with  . Acute Visit    Hypotension    HISTORY OF PRESENT ILLNESS:  This is a 60-YO female seen for an acute visit due to hypotension. She was sent to ER last night due to hypotension. BPs noted to be 95/59 and 90/53. Discharge notes from the hospital recommending Norco to be discontinued. She is currently on Lasix and Lisinopril. She was seen in her room and was alert and  verbally responsive. 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      PAST MEDICAL HISTORY:  Past Medical History:  Diagnosis Date  . Arthritis    in back  . Back pain   . Chest pain 06/16/2012   negative cath with normal LV function  . Headache(784.0)   . Hypertension   . Kidney stones      CURRENT MEDICATIONS: Reviewed  Patient's Medications  New Prescriptions   No medications on file  Previous Medications   ACETAMINOPHEN (TYLENOL) 325 MG TABLET    Take 2 tablets (650 mg total) by mouth every 4 (four) hours as needed for mild pain (or temp > 37.5 C (99.5 F)). Combination of acetaminophen in vicodin and tylenol should not exceed max dose of 4000mg  daily   BACLOFEN (LIORESAL) 10 MG TABLET    Take 5-10 mg by mouth 3 (three) times daily. 10 MG at 6 AM,  10 MG at 2 Pm and 5 MG at 10 pm   CYANOCOBALAMIN (,VITAMIN B-12,) 1000 MCG/ML INJECTION    Inject 1,000 mcg into the muscle every 30 (thirty) days.    DIVALPROEX (DEPAKOTE SPRINKLE) 125 MG CAPSULE    Take 125 mg by mouth 3 (three) times daily.    FLUTICASONE (FLONASE) 50 MCG/ACT NASAL SPRAY    Place 2 sprays into both nostrils daily.    FUROSEMIDE (LASIX) 40 MG TABLET    Take 40 mg by mouth daily.   HYDROCODONE-ACETAMINOPHEN (NORCO/VICODIN) 5-325 MG TABLET    Take 1 tablet by mouth 3 (three) times daily. Hold for sedation   LISINOPRIL (PRINIVIL,ZESTRIL) 30 MG TABLET    Take 15 mg by mouth daily. 1/2 tablet qd.  Hold if SBP <110 or >170/90   LORATADINE (CLARITIN) 10 MG TABLET    Take 10 mg by mouth daily.   NUTRITIONAL SUPPLEMENT LIQD    Take 120 mLs by mouth 2 (two) times daily. MedPass   NUTRITIONAL SUPPLEMENTS (NUTRITIONAL SUPPLEMENT PO)    Take 1 each by mouth daily. Magic Cup   NYSTATIN-TRIAMCINOLONE (MYCOLOG II) CREAM    Apply 1 application topically 2 (two) times daily. Apply to left  arm rash until healed   POTASSIUM CHLORIDE (K-DUR,KLOR-CON) 10 MEQ TABLET    Take 10 mEq by mouth 2 (two) times daily.   ROPINIROLE (REQUIP) 2 MG TABLET    Take 2 mg by mouth 2 (two) times daily.    SENNA-DOCUSATE (SENOKOT-S) 8.6-50 MG TABLET    Take 1 tablet by mouth 2 (two) times daily.  Modified Medications   No medications on file  Discontinued Medications   LISINOPRIL (PRINIVIL,ZESTRIL) 10 MG TABLET    Take 1 tablet (10 mg total) by mouth daily.     Allergies  Allergen Reactions  . Garamycin [Gentamicin Sulfate] Hives and Shortness Of Breath  . Penicillins Anaphylaxis, Itching and Swelling    Has patient had a PCN reaction causing immediate rash, facial/tongue/throat swelling, SOB or lightheadedness with hypotension: Yes Has patient had a PCN reaction causing severe rash involving mucus membranes or skin necrosis: No Has patient had a PCN reaction that required hospitalization No Has patient had a  PCN reaction occurring within the last 10 years: No If all of the above answers are "NO", then may proceed with Cephalosporin use.   . Norvasc [Amlodipine Besylate] Nausea And Vomiting     REVIEW OF SYSTEMS:  GENERAL: no change in appetite, no fatigue, no weight changes, no fever, chills or weakness EYES: Denies change in vision, dry eyes, eye pain, itching or discharge EARS: Denies change in hearing, ringing in ears, or earache NOSE: Denies nasal congestion or epistaxis MOUTH and THROAT: Denies oral discomfort, gingival pain or bleeding, pain from teeth or hoarseness   RESPIRATORY: no cough, SOB, DOE, wheezing, hemoptysis CARDIAC: no chest pain, edema or palpitations GI: no abdominal pain, diarrhea, constipation, heart burn, nausea or vomiting GU: Denies dysuria, frequency, hematuria, incontinence, or discharge PSYCHIATRIC: Denies feeling of depression or anxiety. No report of hallucinations, insomnia, paranoia, or agitation   PHYSICAL EXAMINATION  GENERAL APPEARANCE: Well nourished. In no acute distress. Obese SKIN:  Skin is warm and dry.  HEAD: Normal in size and contour. No evidence of trauma EYES: Lids open and close normally. No blepharitis, entropion or ectropion. PERRL. Conjunctivae are clear and sclerae are white. Lenses are without opacity EARS: Pinnae are normal. Patient hears normal voice tunes of the examiner MOUTH and THROAT: Lips are without lesions. Oral mucosa is moist and without lesions. Tongue is normal in shape, size, and color and without lesions NECK: supple, trachea midline, no neck masses, no thyroid tenderness, no thyromegaly LYMPHATICS: no LAN in the neck, no supraclavicular LAN RESPIRATORY: breathing is even & unlabored, BS CTAB CARDIAC: RRR, no murmur,no extra heart sounds, no edema GI: abdomen soft, normal BS, no masses, no tenderness, no hepatomegaly, no splenomegaly EXTREMITIES:  Right upper and lower extremity hemiparesis NEURO:  Slurred speech,  right hemiparesis PSYCHIATRIC: Alert and oriented X 3. Affect and behavior are appropriate   LABS/RADIOLOGY: Labs reviewed: Basic Metabolic Panel:  Recent Labs  16/10/96 1622 07/28/16 0458 07/28/16 1816  07/30/16 0249 08/01/16 0250 08/05/16 08/15/16 0024  NA  --  139  --   < > 140 138 148* 138  K  --  3.5  --   < > 3.4* 3.0* 3.8 4.1  CL  --  108  --   < > 109 107  --  107  CO2  --  23  --   < > 20* 22  --  21*  GLUCOSE  --  105*  --   < > 104* 98  --  113*  BUN  --  23*  --   < > 12 9 36* 37*  CREATININE  --  0.69  --   < > 0.66 0.57 0.6 0.92  CALCIUM  --  8.8*  --   < > 9.1 9.1  --  9.6  MG 1.9 2.0 2.0  --   --   --   --   --   PHOS 3.8 3.8 3.4  --   --   --   --   --   < > = values in this interval not displayed. Liver Function Tests:  Recent Labs  07/18/16 1000 07/24/16 1619 08/05/16 08/15/16 0024  AST 23 30 22 24   ALT 13* 17 29 29   ALKPHOS 93 97 88 90  BILITOT 0.8 0.6  --  0.8  PROT 7.8 7.3  --  7.7  ALBUMIN 4.3 4.3  --  4.3    Recent Labs  07/18/16 1000 08/15/16 0500  AMMONIA 34 21   CBC:  Recent Labs  07/18/16 1000  07/24/16 1619  07/29/16 0231 07/30/16 0249 08/15/16 0024  WBC 13.3*  < > 16.2*  < > 10.8* 10.4 15.1*  NEUTROABS 8.5*  --  13.4*  --   --   --  8.6*  HGB 14.1  < > 14.5  < > 15.7* 13.6 16.2*  HCT 43.0  < > 44.5  < > 47.8* 41.3 48.1*  MCV 89.8  < > 88.3  < > 86.9 87.5 89.4  PLT 243  < > 239  < > 197 247 250  < > = values in this interval not displayed. Lipid Panel:  Recent Labs  07/25/16 0226  HDL 65   CBG:  Recent Labs  08/01/16 0907 08/01/16 1225 08/15/16 0026  GLUCAP 88 91 102*      Dg Chest 1 View  Result Date: 08/15/2016 CLINICAL DATA:  Altered mental status EXAM: CHEST 1 VIEW COMPARISON:  06/16/2012 FINDINGS: AP portable views of the chest demonstrate no focal airspace consolidation or alveolar edema. The lungs are grossly clear. There is no large effusion or pneumothorax. Cardiac and mediastinal contours appear  unremarkable. IMPRESSION: No active disease. Electronically Signed   By: Ellery Plunk M.D.   On: 08/15/2016 02:45   Dg Knee 2 Views Left  Result Date: 07/24/2016 CLINICAL DATA:  Fall.  Knee pain EXAM: LEFT KNEE - 1-2 VIEW COMPARISON:  None. FINDINGS: No fracture of the proximal tibia or distal femur. Patella is normal. No joint effusion. Degenerate spurring of the lateral compartment. IMPRESSION: 1.  No fracture or dislocation. 2. Osteoarthritis of the lateral compartment. Electronically Signed   By: Genevive Bi M.D.   On: 07/24/2016 18:31   Ct Head Wo Contrast  Result Date: 08/15/2016 CLINICAL DATA:  Altered mental status hypotensive and lethargic EXAM: CT HEAD WITHOUT CONTRAST TECHNIQUE: Contiguous axial images were obtained from the base of the skull through the vertex without intravenous contrast. COMPARISON:  07/28/2016, 07/26/2016 FINDINGS: Brain: A known left basal ganglia intraparenchymal hemorrhage has decreased in size, now measuring 3.1 x 1.5 cm, compared with 5.3 x 2.4 cm on prior CT. Hemorrhage is less dense. Residual 4.5 mm midline shift to the right. Decreasing edema surrounding the bleed. There is less mass effect on the left lateral ventricle. There is no new hemorrhage visualized. Scattered hypodensities in the white matter, likely small vessel disease Vascular: No hyperdense vessels.  No unexpected calcification Skull: No fracture or suspicious bone lesion Sinuses/Orbits: No acute finding. Other:  None IMPRESSION: A known left basal ganglial intraparenchymal hemorrhage is resolving ; it is decreased in size and less dense compared to prior. There is decreased surrounding mass effect. There is residual 4.5 mm midline shift to the right. There is no new hemorrhage identified. Electronically Signed   By: Jasmine Pang M.D.   On: 08/15/2016 03:08   Ct Head Wo Contrast  Result Date: 07/26/2016 CLINICAL DATA:  Altered mental status EXAM: CT HEAD WITHOUT CONTRAST TECHNIQUE: Contiguous  axial images were obtained from the base of the skull through the vertex without intravenous contrast. COMPARISON:  Head CT 07/25/2016 FINDINGS: Brain: Intraparenchymal hematoma centered in the left basal ganglia is unchanged in size, measuring 5.3 x 2.4 cm. The degree of surrounding edema is also unchanged. No new areas of hemorrhage is seen. Rightward midline shift measures 4 mm, slightly increased. There is mass effect on left lateral ventricle. No hydrocephalus. Vascular: No hyperdense vessel or unexpected calcification. Skull: Normal. Negative for fracture or focal lesion. Sinuses/Orbits: No acute finding. Other: None. IMPRESSION: 1. Unchanged size of left basal ganglia centered intraparenchymal hematoma. 2. Slightly increased mass effect with midline shift now measuring 4 mm to the right. 3. No new area of hemorrhage.  No hydrocephalus. Electronically Signed   By: Deatra Robinson M.D.   On: 07/26/2016 04:00   Ct Head Wo Contrast  Result Date: 07/25/2016 CLINICAL DATA:  Intracranial hemorrhage EXAM: CT HEAD WITHOUT CONTRAST TECHNIQUE: Contiguous axial images were obtained from the base of the skull through the vertex without intravenous contrast. COMPARISON:  head CT 07/24/2016 FINDINGS: Despite efforts by the technologist and patient, motion artifact is present on today's examination and could not be eliminated. The findings of this study are interpreted in the context of that limitation. Intraparenchymal hematoma centered in the left basal ganglia measures 5.2 x 2.4 cm, unchanged in size when allowing for differences in slice selection. The degree of surrounding edema is unchanged. Unchanged 3 mm of rightward midline shift. IMPRESSION: 1. Motion degraded examination. He ordered a CTA of the head and neck could not be performed and IV contrast was not administered. 2. Unchanged size of intraparenchymal hematoma centered in the left basal ganglia with unchanged rightward midline shift measuring 3 mm.  Electronically Signed   By: Deatra Robinson M.D.   On: 07/25/2016 05:38   Ct Head Wo Contrast  Result Date: 07/18/2016 CLINICAL DATA:  Altered mental status.  Disoriented.  Combative. EXAM: CT HEAD WITHOUT CONTRAST TECHNIQUE: Contiguous axial images were obtained from the base of the skull through the vertex without intravenous contrast. COMPARISON:  None. FINDINGS: Brain: Motion degraded examination. Allowing for that, the brain appears normal without evidence of atrophy, mass, hemorrhage, hydrocephalus or extra-axial collection. One could question some chronic small-vessel change of the white matter. Sensitivity is limited. Vascular: There is atherosclerotic calcification of the major vessels at the base of the brain. Skull: Negative Sinuses/Orbits: Clear/normal Other: None significant IMPRESSION: Markedly motion degraded exam. No abnormality visible by CT. This rules out any large intracranial hemorrhage, hydrocephalus or mass effect/shift. There may be some chronic small-vessel changes of the white matter, but the examination is quite limited. Electronically Signed   By: Paulina Fusi M.D.   On: 07/18/2016 07:11   Ct Cervical Spine Wo Contrast  Result Date: 07/24/2016 CLINICAL DATA:  Recent fall with known intracranial hemorrhage EXAM: CT CERVICAL SPINE WITHOUT CONTRAST TECHNIQUE: Multidetector CT imaging of the cervical spine was performed without intravenous contrast. Multiplanar CT image reconstructions were also generated.  COMPARISON:  None. FINDINGS: Alignment: Normal. Skull base and vertebrae: 7 cervical segments are well visualized. Vertebral body height is well maintained. No acute fracture or acute facet abnormality is noted. Mild osteophytic changes are noted from C4-C7 predominately posteriorly. Soft tissues and spinal canal: No prevertebral fluid or swelling. No visible canal hematoma. Disc levels:  No specific disc pathology is noted. Upper chest: Within normal limits. IMPRESSION: Mild  degenerative change without acute abnormality. Electronically Signed   By: Alcide Clever M.D.   On: 07/24/2016 16:23   Ct Lumbar Spine W Contrast  Result Date: 07/19/2016 CLINICAL DATA:  Back pain.  History of surgery.  Encephalopathy. EXAM: CT LUMBAR SPINE WITH CONTRAST TECHNIQUE: Multidetector CT imaging of the lumbar spine was performed with intravenous contrast administration. CONTRAST:  100 mL Isovue-300. COMPARISON:  Plain films 03/10/2016.  MRI lumbar spine 11/24/2003. FINDINGS: Segmentation: Standard Alignment: Degenerative scoliosis convex LEFT approximately 15 degrees centered at L3-L4. Vertebrae: There is arthrodesis across the L4-5 and L5-S1 interspaces. This may be postsurgical. No abnormal enhancement is seen within the spinal canal or paravertebral soft tissues. 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 Vascular calcification. Splenic artery aneurysm. Aortic atherosclerosis with cross-sectional measurements of the infrarenal abdominal aorta 25 x 23 mm opposite L4. No hydronephrosis. Disc levels: L1-L2:  Unremarkable. L2-L3:  Unremarkable disc space.  Facet arthropathy. L3-L4: Severe disc space narrowing on the RIGHT with large extraforaminal extrusion accompanied by osseous spurring. Facet arthropathy. LEFT laminotomy. BILATERAL L3 and L4 nerve root impingement are likely, worse on the RIGHT. L4-L5: Interbody arthrodesis. Posterior arthrodesis across the facets. Mild stenosis. No definite subarticular zone narrowing. RIGHT foraminal narrowing could affect the L4 nerve root. L5-S1: Solid interbody and posterior arthrodesis. RIGHT laminotomy. Bony overgrowth contributes to foraminal narrowing bilaterally which could affect either L5 nerve root. Standard IMPRESSION: Solid appearing L4-5 and L5-S1 fusion. Bony foraminal narrowing at these levels could affect the exiting L4 and L5 nerve roots. 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 Severe disc space narrowing, osseous  spurring, and extraforaminal protrusion on the RIGHT. RIGHT greater than LEFT L3 and L4 nerve root impingement. Aortic atherosclerosis. Ectatic abdominal aorta at risk for aneurysm development. Recommend followup by ultrasound in 5 years. This recommendation follows ACR consensus guidelines: White Paper of the ACR Incidental Findings Committee II on Vascular Findings. J Am Coll Radiol 2013; 10:789-794. Electronically Signed   By: Elsie Stain M.D.   On: 07/19/2016 07:50   Mr Shirlee Latch ZO Contrast  Result Date: 07/28/2016 CLINICAL DATA:  61 year old female with left basal ganglia hemorrhage discovered on 07/24/2016 during evaluation of altered mental status. EXAM: MRI HEAD WITHOUT CONTRAST MRA HEAD WITHOUT CONTRAST TECHNIQUE: Multiplanar, multiecho pulse sequences of the brain and surrounding structures were obtained without intravenous contrast. Angiographic images of the head were obtained using MRA technique without contrast. COMPARISON:  Head CTs without contrast 07/26/2016 and earlier. FINDINGS: MRI HEAD FINDINGS Brain: Heterogeneous but mostly hyperintense T1 signal and dark T2 signal intra-axial hemorrhage centered at the left lentiform nuclei encompasses 53 x 25 x 39 mm (AP by transverse by CC), for an estimated blood volume of 26 mL. Size and configuration has not significantly changed since 07/24/2016. Surrounding edema. Regional mass effect with 45 mm of rightward midline shift. Mass effect on the left lateral ventricle, but no intraventricular extension of blood. No extra-axial extension identified. Basilar cisterns remain patent. No restricted diffusion to suggest acute infarction. No ventriculomegaly. Cervicomedullary junction and pituitary are within normal limits. In addition to  the edema in the left hemisphere there is patchy in widespread bilateral cerebral white matter T2 and FLAIR hyperintensity in a nonspecific configuration. No cortical encephalomalacia identified. No definite other  parenchymal blood products. Brainstem and cerebellum are within normal limits. Vascular: Major intracranial vascular flow voids are preserved, 0 with mild generalized intracranial artery dolichoectasia. Skull and upper cervical spine: Negative. Visualized bone marrow signal is within normal limits. Sinuses/Orbits: Negative orbits soft tissues. Trace paranasal sinus mucosal thickening. Other: Mastoids are clear. Visible internal auditory structures appear normal. Negative scalp soft tissues. MRA HEAD FINDINGS Antegrade flow in the posterior circulation with codominant distal vertebral arteries. Normal PICA origins. Tortuous but otherwise negative vertebrobasilar junction. Tortuous basilar artery without stenosis. Normal SCA and PCA origins. Posterior communicating arteries are diminutive or absent. Mildly tortuous left P1 segment. Bilateral PCA branches are within normal limits. Posterior communicating arteries are diminutive or absent. Antegrade flow in both ICA siphons. Tortuous distal cervical right ICA. No siphon stenosis. Normal ophthalmic artery origins. Patent carotid termini. MCA and ACA origins are within normal limits. Mildly dominant right ACA A1 segment. Anterior communicating artery within normal limits. Dominant right ACA A2 segment. This somewhat simulates an azygos ACA anatomy. Mildly tortuous bilateral MCA M1 segments. No M1 stenosis. Bilateral MCA bifurcations and visible bilateral MCA branches are within normal limits. IMPRESSION: 1. Acute intra-axial hemorrhage in the left lentiform with estimated blood volume of 26 mL has not significantly changed since 07/24/2016. Surrounding edema and regional mass effect including partial effacement of the left lateral ventricle, but no intraventricular hemorrhage, extra-axial hemorrhage, or ventriculomegaly. 2. Negative intracranial MRA aside from intracranial artery tortuosity. 3. Moderate to severe for age nonspecific cerebral white matter signal changes.  Top differential considerations include chronic small vessel disease and chronic demyelinating disease. Electronically Signed   By: Odessa Fleming M.D.   On: 07/28/2016 19:50   Mr Brain Wo Contrast  Result Date: 07/28/2016 CLINICAL DATA:  61 year old female with left basal ganglia hemorrhage discovered on 07/24/2016 during evaluation of altered mental status. EXAM: MRI HEAD WITHOUT CONTRAST MRA HEAD WITHOUT CONTRAST TECHNIQUE: Multiplanar, multiecho pulse sequences of the brain and surrounding structures were obtained without intravenous contrast. Angiographic images of the head were obtained using MRA technique without contrast. COMPARISON:  Head CTs without contrast 07/26/2016 and earlier. FINDINGS: MRI HEAD FINDINGS Brain: Heterogeneous but mostly hyperintense T1 signal and dark T2 signal intra-axial hemorrhage centered at the left lentiform nuclei encompasses 53 x 25 x 39 mm (AP by transverse by CC), for an estimated blood volume of 26 mL. Size and configuration has not significantly changed since 07/24/2016. Surrounding edema. Regional mass effect with 45 mm of rightward midline shift. Mass effect on the left lateral ventricle, but no intraventricular extension of blood. No extra-axial extension identified. Basilar cisterns remain patent. No restricted diffusion to suggest acute infarction. No ventriculomegaly. Cervicomedullary junction and pituitary are within normal limits. In addition to the edema in the left hemisphere there is patchy in widespread bilateral cerebral white matter T2 and FLAIR hyperintensity in a nonspecific configuration. No cortical encephalomalacia identified. No definite other parenchymal blood products. Brainstem and cerebellum are within normal limits. Vascular: Major intracranial vascular flow voids are preserved, 0 with mild generalized intracranial artery dolichoectasia. Skull and upper cervical spine: Negative. Visualized bone marrow signal is within normal limits. Sinuses/Orbits:  Negative orbits soft tissues. Trace paranasal sinus mucosal thickening. Other: Mastoids are clear. Visible internal auditory structures appear normal. Negative scalp soft tissues. MRA HEAD FINDINGS Antegrade flow in the  posterior circulation with codominant distal vertebral arteries. Normal PICA origins. Tortuous but otherwise negative vertebrobasilar junction. Tortuous basilar artery without stenosis. Normal SCA and PCA origins. Posterior communicating arteries are diminutive or absent. Mildly tortuous left P1 segment. Bilateral PCA branches are within normal limits. Posterior communicating arteries are diminutive or absent. Antegrade flow in both ICA siphons. Tortuous distal cervical right ICA. No siphon stenosis. Normal ophthalmic artery origins. Patent carotid termini. MCA and ACA origins are within normal limits. Mildly dominant right ACA A1 segment. Anterior communicating artery within normal limits. Dominant right ACA A2 segment. This somewhat simulates an azygos ACA anatomy. Mildly tortuous bilateral MCA M1 segments. No M1 stenosis. Bilateral MCA bifurcations and visible bilateral MCA branches are within normal limits. IMPRESSION: 1. Acute intra-axial hemorrhage in the left lentiform with estimated blood volume of 26 mL has not significantly changed since 07/24/2016. Surrounding edema and regional mass effect including partial effacement of the left lateral ventricle, but no intraventricular hemorrhage, extra-axial hemorrhage, or ventriculomegaly. 2. Negative intracranial MRA aside from intracranial artery tortuosity. 3. Moderate to severe for age nonspecific cerebral white matter signal changes. Top differential considerations include chronic small vessel disease and chronic demyelinating disease. Electronically Signed   By: Odessa Fleming M.D.   On: 07/28/2016 19:50   Dg Abd Portable 1v  Result Date: 07/29/2016 CLINICAL DATA:  Check feeding catheter placement EXAM: PORTABLE ABDOMEN - 1 VIEW COMPARISON:   07/27/2016 FINDINGS: Feeding catheter is again noted with its tip in the distal stomach. This is similar to that seen on the prior exam. Nonobstructive bowel gas pattern is noted. IMPRESSION: Feeding catheter in the distal stomach. Electronically Signed   By: Alcide Clever M.D.   On: 07/29/2016 13:53   Dg Abd Portable 1v  Result Date: 07/27/2016 CLINICAL DATA:  Feeding tube placement EXAM: PORTABLE ABDOMEN - 1 VIEW COMPARISON:  None. FINDINGS: There is normal small bowel gas pattern. There is NG feeding tube with tip in distal stomach/pyloric region. IMPRESSION: NG feeding tube with tip in distal stomach/ pyloric region. Electronically Signed   By: Natasha Mead M.D.   On: 07/27/2016 11:21   Dg Hip Unilat W Or Wo Pelvis 2-3 Views Left  Result Date: 07/24/2016 CLINICAL DATA:  61 y/o F; status post fall with pain of either the left hip were left knee. EXAM: DG HIP (WITH OR WITHOUT PELVIS) 2-3V LEFT COMPARISON:  None. FINDINGS: No acute fracture or dislocation is identified. If symptoms persist or if clinically indicated MRI is more sensitive for acute hip fracture. Vascular calcifications. Mild osteoarthrosis of sacroiliac joints and symphysis pubis. IMPRESSION: No acute fracture or dislocation is identified. If symptoms persist or if clinically indicated MRI is more sensitive for acute hip fracture. Electronically Signed   By: Mitzi Hansen M.D.   On: 07/24/2016 18:33   Ct Head Code Stroke Wo Contrast  Result Date: 07/24/2016 CLINICAL DATA:  Code stroke. Code stroke. Additional symptoms not described. Altered mental status. EXAM: CT HEAD WITHOUT CONTRAST TECHNIQUE: Contiguous axial images were obtained from the base of the skull through the vertex without intravenous contrast. COMPARISON:  07/18/2016 FINDINGS: Brain: The patient has developed intraparenchymal hematoma in the left basal ganglia measuring 4.8 x 3.5 x 2.7 cm (volume = 24 cm^3). Mild surrounding edema. Mild mass effect with  left-to-right shift of 2 mm. 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 No intraventricular penetration. No subarachnoid penetration. No hydrocephalus or extra-axial collection. Vascular: There is atherosclerotic calcification of the major vessels at the base of the brain. Skull:  Normal Sinuses/Orbits: Clear/normal Other: None ASPECTS (Alberta Stroke Program Early CT Score) - Ganglionic level infarction (caudate, lentiform nuclei, internal capsule, insula, M1-M3 cortex): 7 - Supraganglionic infarction (M4-M6 cortex): 3 Total score (0-10 with 10 being normal): 10 IMPRESSION: 1. Intraparenchymal hemorrhage in the left basal ganglia measuring 4.8 x 2.7 x 3.5 cm, volume 24 cc. Mild surrounding edema. Mild mass effect with left-to-right shift of 2 mm. Old right frontal infarction. 2. Page was placed by telephone at the time of interpretation on 07/24/2016 at 4:11 pm to Dr. Jonna Munro. 3. ASPECTS is 10 Electronically Signed   By: Paulina Fusi M.D.   On: 07/24/2016 16:16    ASSESSMENT/PLAN:  Hypotension - decrease Lasix from 40 mg to 20 mg PO Q D and decrease Lisinopril from 10 mg to 5 mg Q D and hold for SBP <110, BP BID X 1 week  Chronic pain - physiatry has discontinued Norco 5/325 mg 1 tab PO TID and Baclofen from 10 mg to 5 mg Q D  Left intraparenchymal hemorrhage - has right-sided hemiplegia and dysarthria, continue PT, OT and ST for therapeutic strengthening exercises  Right hemiparesis - continue rehabilitation with PT and OT for therapeutic strengthening exercises, fall precautions     Airyanna Dipalma C. Medina-Vargas - NP    [employer] [phone]

## 2016-08-15 NOTE — ED Provider Notes (Signed)
[facility]-EMERGENCY DEPT Provider Note   CSN: [id number] Arrival date & time: 08/14/16  2353  By signing my name below, I, Teofilo Pod, attest that this documentation has been prepared under the direction and in the presence of [facility], PA-C. Electronically Signed: Teofilo Pod, ED Scribe. 08/15/2016. 12:23 AM.    History   Chief Complaint Chief Complaint  Patient presents with  . Hypotension   LEVEL 5 CAVEAT DUE TO ACUITY OF APHASIA  The history is provided by the patient and medical records. No language interpreter was used.   HPI Comments:  Heidi Green is a 61 y.o. 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 Mild surrounding edema. Mild mass effect with left-to-right shift of 2 mm.) who presents to the Emergency Department, here due to AMS since 2:30pm today per EMS. Pt is here from [facility], and was sent here because she was hypotensive. Staff reports that pt has been abnormally lethargic which is not baseline for her. Pt is baseline aphasic.    Level 5 Caveat for AMS and nonverbal   Past Medical History:  Diagnosis Date  . Arthritis    in back  . Back pain   . Chest pain 06/16/2012   negative cath with normal LV function  . Headache(784.0)   . Hypertension   . Kidney stones     Patient Active Problem List   Diagnosis Date Noted  . Hypertensive emergency 08/01/2016  . Dysphagia following nontraumatic intracerebral hemorrhage 08/01/2016  . Hypokalemia 08/01/2016  . Agitation 08/01/2016  . Non-compliance 08/01/2016  . Allergic rhinitis 08/01/2016  . 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 Class 2 obesity   . Acute encephalopathy 07/18/2016  . NSTEMI (non-ST elevated myocardial infarction) (HCC) 06/17/2012  . Chest pain 06/16/2012  . HTN (hypertension) 06/16/2012  .  Dyslipidemia 06/16/2012  . Back pain 06/16/2012  . Tobacco abuse 06/16/2012    Past Surgical History:  Procedure Laterality Date  . ABDOMINAL HYSTERECTOMY    . BACK SURGERY    . LEFT HEART CATHETERIZATION WITH CORONARY ANGIOGRAM N/A 06/16/2012   Procedure: LEFT HEART CATHETERIZATION WITH CORONARY ANGIOGRAM;  Surgeon: Peter M Swaziland, MD;  Location: [facility];  Service: Cardiovascular;  Laterality: N/A;  . LITHOTRIPSY    . TUBAL LIGATION      OB History    No data available       Home Medications    Prior to Admission medications   Medication Sig Start Date End Date Taking? Authorizing Provider  acetaminophen (TYLENOL) 325 MG tablet Take 2 tablets (650 mg total) by mouth every 4 (four) hours as needed for mild pain (or temp > 37.5 C (99.5 F)). Combination of acetaminophen in vicodin and tylenol should not exceed max dose of 4000mg  daily 08/01/16  Yes Layne Benton, NP  baclofen (LIORESAL) 10 MG tablet Take 5-10 mg by mouth 3 (three) times daily. 10 MG at 6 AM, 10 MG at 2 Pm and 5 MG at 10 pm 06/21/16  Yes Historical Provider, MD  cyanocobalamin (,VITAMIN B-12,) 1000 MCG/ML injection Inject 1,000 mcg into the muscle every 30 (thirty) days.  07/11/16  Yes Historical Provider, MD  divalproex (DEPAKOTE SPRINKLE) 125 MG capsule Take 125 mg by mouth 2 (two) times daily.   Yes Historical Provider, MD  fluticasone Aleda Grana)  50 MCG/ACT nasal spray Place 2 sprays into both nostrils daily.    Yes Historical Provider, MD  furosemide (LASIX) 40 MG tablet Take 40 mg by mouth daily.   Yes Historical Provider, MD  lisinopril (PRINIVIL,ZESTRIL) 10 MG tablet Take 1 tablet (10 mg total) by mouth daily. Patient taking differently: Take 15 mg by mouth daily.  08/02/16  Yes Layne Benton, NP  loratadine (CLARITIN) 10 MG tablet Take 10 mg by mouth daily.   Yes Historical Provider, MD  potassium chloride (K-DUR,KLOR-CON) 10 MEQ tablet Take 10 mEq by mouth 2 (two) times daily.   Yes Historical Provider, MD    rOPINIRole (REQUIP) 2 MG tablet Take 2 mg by mouth 2 (two) times daily.   Yes Historical Provider, MD  senna-docusate (SENOKOT-S) 8.6-50 MG tablet Take 1 tablet by mouth 2 (two) times daily. 08/01/16  Yes Layne Benton, NP    Family History Family History  Problem Relation Age of Onset  . Emphysema Mother   . Diabetes Mother     Social History Social History  Substance Use Topics  . Smoking status: Former Smoker    Packs/day: 0.50    Years: 26.00    Types: Cigarettes    Quit date: 06/17/2012  . Smokeless tobacco: Never Used  . Alcohol use No     Allergies   Garamycin [gentamicin sulfate]; Penicillins; and Norvasc [amlodipine besylate]   Review of Systems Review of Systems  Unable to perform ROS: Patient nonverbal     Physical Exam Updated Vital Signs BP 117/87   Pulse 96   Temp 98.6 F (37 C) (Oral)   Resp 18   SpO2 92%   Physical Exam  Constitutional: She appears well-developed and well-nourished. She appears lethargic. No distress.  Awake, eyes open to voice  HENT:  Head: Normocephalic and atraumatic.  Mouth/Throat: Oropharynx is clear and moist. No oropharyngeal exudate.  Eyes: Conjunctivae are normal. Pupils are equal, round, and reactive to light. No scleral icterus.  Neck: Normal range of motion. Neck supple.  Cardiovascular: Normal rate, regular rhythm and intact distal pulses.   Pulmonary/Chest: Effort normal and breath sounds normal. No respiratory distress. She has no wheezes.  Equal chest expansion  Abdominal: Soft. Bowel sounds are normal. She exhibits no mass. There is no tenderness. There is no rebound and no guarding.  Genitourinary: Rectal exam shows no external hemorrhoid, no internal hemorrhoid, no fissure, no mass, no tenderness, anal tone normal and guaiac negative stool. Pelvic exam was performed with patient in the knee-chest position.  Genitourinary Comments: DRE without gross blood  Musculoskeletal:  Right hemiparesis, moves left arm  and leg without difficulty  Neurological: She appears lethargic.  Pt shakes head yes and no, but is very slow to respond in this manner.  Eyes open to voice  Skin: Skin is warm and dry. She is not diaphoretic.  No skin breakdown No pressure ulcers noted  Nursing note and vitals reviewed.    ED Treatments / Results  DIAGNOSTIC STUDIES:  Oxygen Saturation is 100% on RA, normal by my interpretation.    COORDINATION OF CARE:  12:23 AM Discussed treatment plan with pt at bedside and pt agreed to plan.   Labs (all labs ordered are listed, but only abnormal results are displayed) Labs Reviewed  CBC - Abnormal; Notable for the following:       Result Value   WBC 15.1 (*)    RBC 5.38 (*)    Hemoglobin 16.2 (*)  HCT 48.1 (*)    All other components within normal limits  DIFFERENTIAL - Abnormal; Notable for the following:    Neutro Abs 8.6 (*)    Lymphs Abs 5.1 (*)    All other components within normal limits  COMPREHENSIVE METABOLIC PANEL - Abnormal; Notable for the following:    CO2 21 (*)    Glucose, Bld 113 (*)    BUN 37 (*)    All other components within normal limits  RAPID URINE DRUG SCREEN, HOSP PERFORMED - Abnormal; Notable for the following:    Opiates POSITIVE (*)    All other components within normal limits  URINALYSIS, ROUTINE W REFLEX MICROSCOPIC - Abnormal; Notable for the following:    Color, Urine AMBER (*)    APPearance HAZY (*)    Ketones, ur 5 (*)    All other components within normal limits  PROTIME-INR - Abnormal; Notable for the following:    Prothrombin Time 15.4 (*)    All other components within normal limits  APTT - Abnormal; Notable for the following:    aPTT 22 (*)    All other components within normal limits  CBG MONITORING, ED - Abnormal; Notable for the following:    Glucose-Capillary 102 (*)    All other components within normal limits  CULTURE, BLOOD (ROUTINE X 2)  CULTURE, BLOOD (ROUTINE X 2)  URINE CULTURE  ETHANOL  AMMONIA   I-STAT TROPOININ, ED  I-STAT CG4 LACTIC ACID, ED  POC OCCULT BLOOD, ED    EKG  EKG Interpretation  Date/Time:  Thursday Aug 15 2016 02:48:23 EDT Ventricular Rate:  98 PR Interval:    QRS Duration: 101 QT Interval:  356 QTC Calculation: 455 R Axis:   -41 Text Interpretation:  Sinus rhythm Consider right atrial enlargement Confirmed by Nicanor Alcon, April ([id number]) on 08/15/2016 2:53:35 AM       Radiology Dg Chest 1 View  Result Date: 08/15/2016 CLINICAL DATA:  Altered mental status EXAM: CHEST 1 VIEW COMPARISON:  06/16/2012 FINDINGS: AP portable views of the chest demonstrate no focal airspace consolidation or alveolar edema. The lungs are grossly clear. There is no large effusion or pneumothorax. Cardiac and mediastinal contours appear unremarkable. IMPRESSION: No active disease. Electronically Signed   By: Ellery Plunk M.D.   On: 08/15/2016 02:45   Ct Head Wo Contrast  Result Date: 08/15/2016 CLINICAL DATA:  Altered mental status hypotensive and lethargic EXAM: CT HEAD WITHOUT CONTRAST TECHNIQUE: Contiguous axial images were obtained from the base of the skull through the vertex without intravenous contrast. COMPARISON:  07/28/2016, 07/26/2016 FINDINGS: Brain: A known left basal ganglia intraparenchymal hemorrhage has decreased in size, now measuring 3.1 x 1.5 cm, compared with 5.3 x 2.4 cm on prior CT. Hemorrhage is less dense. Residual 4.5 mm midline shift to the right. Decreasing edema surrounding the bleed. There is less mass effect on the left lateral ventricle. There is no new hemorrhage visualized. Scattered hypodensities in the white matter, likely small vessel disease Vascular: No hyperdense vessels.  No unexpected calcification Skull: No fracture or suspicious bone lesion Sinuses/Orbits: No acute finding. Other: None IMPRESSION: A known left basal ganglial intraparenchymal hemorrhage is resolving ; it is decreased in size and less dense compared to prior. There is decreased  surrounding mass effect. There is residual 4.5 mm midline shift to the right. There is no new hemorrhage identified. Electronically Signed   By: Jasmine Pang M.D.   On: 08/15/2016 03:08    Procedures Procedures (including critical care time)  Medications Ordered in ED Medications - No data to display   Initial Impression / Assessment and Plan / ED Course  I have reviewed the triage vital signs and the nursing notes.  Pertinent labs & imaging results that were available during my care of the patient were reviewed by me and considered in my medical decision making (see chart for details).  Clinical Course as of Aug 16 606  Thu Aug 15, 2016  0030 Glucose-Capillary: Marland Kitchen(!) 102 [HM]    Clinical Course User Index [HM] Dierdre ForthHannah Brityn Mastrogiovanni, PA-C    [facility]  EMS stated their call was for hypotesion, but pt was not found to be hypotensive by EMS or here in the ED.  Pt lethargic upon arrival and slow to respond.  Pupils midpoint and reactive.  Pt nods her head yes and no but seems confused.  MAR shows scheduled Vicodin for chronic pain.    Extensive work-up is reassuring in the ED.  Labs without evidence of infection.  No UTI or PNA. Ammonia levels normal.  UDS positive for opioids alone. ICH is resolving and has not extended.  She has returned to documented baseline from hospital discharge documentation.  I suspect she has delayed effects and decreased metabolism of her opioid.  Pt will be d/c back to the rehab facility. She is to STOP her opioid medication and return if her confusion returns, she becomes febrile or there are any other concerns.    The patient was discussed with and seen by Dr. Nicanor AlconPalumbo who agrees with the treatment plan and discharge back to the facility.   Final Clinical Impressions(s) / ED Diagnoses   Final diagnoses:  Transient alteration of awareness  Chronic, continuous use of opioids  Right hemiplegia [facility](HCC)  Aphasia    New Prescriptions Current  Discharge Medication List      I personally performed the services described in this documentation, which was scribed in my presence. The recorded information has been reviewed and is accurate.    Dierdre ForthHannah Nitasha Jewel, PA-C 08/15/16 [id number]    April Palumbo, MD 08/15/16 [phone]

## 2016-08-15 NOTE — Discharge Instructions (Signed)
1. Medications: STOP hydrocodone, usual home medications as directed 2. Treatment: rest, drink plenty of fluids,  3. Follow Up: Please followup with your primary doctor in 1-2days for discussion of your diagnoses and further evaluation after today's visit; if you do not have a primary care doctor use the resource guide provided to find one; Please return to the ER for return of confusion or hypotension, fever, vomiting, or other concerns

## 2016-08-16 ENCOUNTER — Encounter: Payer: Self-pay | Admitting: Internal Medicine

## 2016-08-16 ENCOUNTER — Non-Acute Institutional Stay (SKILLED_NURSING_FACILITY): Payer: Medicare HMO | Admitting: Internal Medicine

## 2016-08-16 DIAGNOSIS — I10 Essential (primary) hypertension: Secondary | ICD-10-CM

## 2016-08-16 DIAGNOSIS — G9340 Encephalopathy, unspecified: Secondary | ICD-10-CM

## 2016-08-16 DIAGNOSIS — N3000 Acute cystitis without hematuria: Secondary | ICD-10-CM | POA: Diagnosis not present

## 2016-08-16 DIAGNOSIS — D72825 Bandemia: Secondary | ICD-10-CM

## 2016-08-16 NOTE — Progress Notes (Signed)
LOCATION: [facility]  PCP: Karle Plumber, MD   Code Status: Full Code  Goals of care: Advanced Directive information Advanced Directives 08/15/2016  Does Patient Have a Medical Advance Directive? Yes  Type of Advance Directive Healthcare Power of Attorney  Would patient like information on creating a medical advance directive? -  Pre-existing out of facility DNR order (yellow form or pink MOST form) -       Extended Emergency Contact Information Primary Emergency Contact: Conerly,[name] Address: [address]          [place], [place] [address] [place] Phone: [phone] Relation: Spouse   Allergies  Allergen Reactions  . Garamycin [Gentamicin Sulfate] Hives and Shortness Of Breath  . Penicillins Anaphylaxis, Itching and Swelling    Has patient had a PCN reaction causing immediate rash, facial/tongue/throat swelling, SOB or lightheadedness with hypotension: Yes Has patient had a PCN reaction causing severe rash involving mucus membranes or skin necrosis: No Has patient had a PCN reaction that required hospitalization No Has patient had a PCN reaction occurring within the last 10 years: No If all of the above answers are "NO", then may proceed with Cephalosporin use.   Marland Kitchen Norvasc [Amlodipine Besylate] Nausea And Vomiting    Chief Complaint  Patient presents with  . Acute Visit    Decreased responsiveness      HPI:  Patient is a 61 y.o. female seen today for acute visit. Staff have noticed increased sleepiness. She was sent to ED yesterday for her somnolence. CT head showed no new bleed but midline shift was noted (slightly increased from before) but no other acute change. There was concern form her narcotics contributing to increased sleepiness and she was discharged back to facility with suggestion for med titration. She is seen in her room today. Of note, she is here for short term rehabilitation post hospital admission from 07/24/16-08/01/16  with altered mental state from left intraparenchymal hemorrhage with right upper extremity weakness and right facial droop.   Review of Systems: gives one word answer to few questions.  Constitutional: Negative for fever. She feels tired.   HENT: Negative for headache Respiratory: Negative for cough, shortness of breath Cardiovascular: Negative for chest pain Gastrointestinal: Negative for vomiting, abdominal pain. Per nursing, taking her medication, eating 25% of her meals. Musculoskeletal: Negative for fall in the facility and pain.     Past Medical History:  Diagnosis Date  . Arthritis    in back  . Back pain   . Chest pain 06/16/2012   negative cath with normal LV function  . Headache(784.0)   . Hypertension   . Kidney stones    Past Surgical History:  Procedure Laterality Date  . ABDOMINAL HYSTERECTOMY    . BACK SURGERY    . LEFT HEART CATHETERIZATION WITH CORONARY ANGIOGRAM N/A 06/16/2012   Procedure: LEFT HEART CATHETERIZATION WITH CORONARY ANGIOGRAM;  Surgeon: Peter M Swaziland, MD;  Location: [facility] CATH LAB;  Service: Cardiovascular;  Laterality: N/A;  . LITHOTRIPSY    . TUBAL LIGATION     Social History:   reports that she quit smoking about 4 years ago. Her smoking use included Cigarettes. She has a 13.00 pack-year smoking history. She has never used smokeless tobacco. She reports that she does not drink alcohol or use drugs.  Family History  Problem Relation Age of Onset  . Emphysema Mother   . Diabetes Mother     Medications: Allergies as of 08/16/2016  Reactions   Garamycin [gentamicin Sulfate] Hives, Shortness Of Breath   Penicillins Anaphylaxis, Itching, Swelling   Has patient had a PCN reaction causing immediate rash, facial/tongue/throat swelling, SOB or lightheadedness with hypotension: Yes Has patient had a PCN reaction causing severe rash involving mucus membranes or skin necrosis: No Has patient had a PCN reaction that required hospitalization No Has  patient had a PCN reaction occurring within the last 10 years: No If all of the above answers are "NO", then may proceed with Cephalosporin use.   Norvasc [amlodipine Besylate] Nausea And Vomiting      Medication List       Accurate as of 08/16/16  2:50 PM. Always use your most recent med list.          acetaminophen 325 MG tablet Commonly known as:  TYLENOL Take 2 tablets (650 mg total) by mouth every 4 (four) hours as needed for mild pain (or temp > 37.5 C (99.5 F)). Combination of acetaminophen in vicodin and tylenol should not exceed max dose of 4000mg  daily   baclofen 10 MG tablet Commonly known as:  LIORESAL Take 10 mg by mouth 2 (two) times daily. Give at 6 am and 2 pm   cyanocobalamin 1000 MCG/ML injection Commonly known as:  (VITAMIN B-12) Inject 1,000 mcg into the muscle every 30 (thirty) days.   divalproex 125 MG capsule Commonly known as:  DEPAKOTE SPRINKLE Take 125 mg by mouth 3 (three) times daily.   fluticasone 50 MCG/ACT nasal spray Commonly known as:  FLONASE Place 2 sprays into both nostrils daily.   furosemide 20 MG tablet Commonly known as:  LASIX Take 20 mg by mouth daily.   lisinopril 5 MG tablet Commonly known as:  PRINIVIL,ZESTRIL Take 5 mg by mouth daily. Hold for SBP <110   loratadine 10 MG tablet Commonly known as:  CLARITIN Take 10 mg by mouth daily.   NUTRITIONAL SUPPLEMENT Liqd Take 120 mLs by mouth 2 (two) times daily. MedPass   NUTRITIONAL SUPPLEMENT PO Take 1 each by mouth daily. Magic Cup   nystatin-triamcinolone cream Commonly known as:  MYCOLOG II Apply 1 application topically 2 (two) times daily. Apply to left arm rash until healed   potassium chloride 10 MEQ tablet Commonly known as:  K-DUR,KLOR-CON Take 10 mEq by mouth 2 (two) times daily.   rOPINIRole 2 MG tablet Commonly known as:  REQUIP Take 2 mg by mouth 2 (two) times daily.   senna-docusate 8.6-50 MG tablet Commonly known as:  Senokot-S Take 1 tablet by mouth  2 (two) times daily.       Immunizations: Immunization History  Administered Date(s) Administered  . PPD Test 08/01/2016     Physical Exam: Vitals:   08/16/16 1441  BP: 106/65  Pulse: 78  Resp: 18  Temp: 98.2 F (36.8 C)  TempSrc: Oral  SpO2: 95%  Weight: 209 lb 12.8 oz (95.2 kg)  Height: 5\' 6"  (1.676 m)   Body mass index is 33.86 kg/m.  General- adult female, obese in no acute distress Head- normocephalic, atraumatic Nose- no nasal discharge Throat- moist mucus membrane Eyes- PERRLA, EOMI, no pallor, no icterus, no discharge, normal conjunctiva, normal sclera Neck- no cervical lymphadenopathy Cardiovascular- normal s1,s2, no murmur Respiratory- bilateral clear to auscultation, no wheeze, no rhonchi, no crackles, no use of accessory muscles Abdomen- bowel sounds present, soft, non tender, no guarding or rigidity Musculoskeletal- able to move all 4 extremities, generalized weakness most prominent to right side Neurological- somnolent, responds to name  call Skin- warm and dry, no diaphoresis   Labs reviewed: Basic Metabolic Panel:  Recent Labs  16/01/9603/14/18 1622 07/28/16 0458 07/28/16 1816  07/30/16 0249 08/01/16 0250 08/05/16 08/15/16 0024  NA  --  139  --   < > 140 138 148* 138  K  --  3.5  --   < > 3.4* 3.0* 3.8 4.1  CL  --  108  --   < > 109 107  --  107  CO2  --  23  --   < > 20* 22  --  21*  GLUCOSE  --  105*  --   < > 104* 98  --  113*  BUN  --  23*  --   < > 12 9 36* 37*  CREATININE  --  0.69  --   < > 0.66 0.57 0.6 0.92  CALCIUM  --  8.8*  --   < > 9.1 9.1  --  9.6  MG 1.9 2.0 2.0  --   --   --   --   --   PHOS 3.8 3.8 3.4  --   --   --   --   --   < > = values in this interval not displayed. Liver Function Tests:  Recent Labs  07/18/16 1000 07/24/16 1619 08/05/16 08/15/16 0024  AST 23 30 22 24   ALT 13* 17 29 29   ALKPHOS 93 97 88 90  BILITOT 0.8 0.6  --  0.8  PROT 7.8 7.3  --  7.7  ALBUMIN 4.3 4.3  --  4.3   No results for input(s):  LIPASE, AMYLASE in the last 8760 hours.  Recent Labs  07/18/16 1000 08/15/16 0500  AMMONIA 34 21   CBC:  Recent Labs  07/18/16 1000  07/24/16 1619  07/29/16 0231 07/30/16 0249 08/15/16 0024  WBC 13.3*  < > 16.2*  < > 10.8* 10.4 15.1*  NEUTROABS 8.5*  --  13.4*  --   --   --  8.6*  HGB 14.1  < > 14.5  < > 15.7* 13.6 16.2*  HCT 43.0  < > 44.5  < > 47.8* 41.3 48.1*  MCV 89.8  < > 88.3  < > 86.9 87.5 89.4  PLT 243  < > 239  < > 197 247 250  < > = values in this interval not displayed. Cardiac Enzymes: No results for input(s): CKTOTAL, CKMB, CKMBINDEX, TROPONINI in the last 8760 hours. BNP: Invalid input(s): POCBNP CBG:  Recent Labs  08/01/16 0907 08/01/16 1225 08/15/16 0026  GLUCAP 88 91 102*    Radiological Exams: Dg Chest 1 View  Result Date: 08/15/2016 CLINICAL DATA:  Altered mental status EXAM: CHEST 1 VIEW COMPARISON:  06/16/2012 FINDINGS: AP portable views of the chest demonstrate no focal airspace consolidation or alveolar edema. The lungs are grossly clear. There is no large effusion or pneumothorax. Cardiac and mediastinal contours appear unremarkable. IMPRESSION: No active disease. Electronically Signed   By: Ellery Plunkaniel R Mitchell M.D.   On: 08/15/2016 02:45   Dg Knee 2 Views Left  Result Date: 07/24/2016 CLINICAL DATA:  Fall.  Knee pain EXAM: LEFT KNEE - 1-2 VIEW COMPARISON:  None. FINDINGS: No fracture of the proximal tibia or distal femur. Patella is normal. No joint effusion. Degenerate spurring of the lateral compartment. IMPRESSION: 1.  No fracture or dislocation. 2. Osteoarthritis of the lateral compartment. Electronically Signed   By: Genevive BiStewart  Edmunds M.D.   On: 07/24/2016 18:31  Ct Head Wo Contrast  Result Date: 08/15/2016 CLINICAL DATA:  Altered mental status hypotensive and lethargic EXAM: CT HEAD WITHOUT CONTRAST TECHNIQUE: Contiguous axial images were obtained from the base of the skull through the vertex without intravenous contrast. COMPARISON:   07/28/2016, 07/26/2016 FINDINGS: Brain: A known left basal ganglia intraparenchymal hemorrhage has decreased in size, now measuring 3.1 x 1.5 cm, compared with 5.3 x 2.4 cm on prior CT. Hemorrhage is less dense. Residual 4.5 mm midline shift to the right. Decreasing edema surrounding the bleed. There is less mass effect on the left lateral ventricle. There is no new hemorrhage visualized. Scattered hypodensities in the white matter, likely small vessel disease Vascular: No hyperdense vessels.  No unexpected calcification Skull: No fracture or suspicious bone lesion Sinuses/Orbits: No acute finding. Other: None IMPRESSION: A known left basal ganglial intraparenchymal hemorrhage is resolving ; it is decreased in size and less dense compared to prior. There is decreased surrounding mass effect. There is residual 4.5 mm midline shift to the right. There is no new hemorrhage identified. Electronically Signed   By: Jasmine Pang M.D.   On: 08/15/2016 03:08   Ct Head Wo Contrast  Result Date: 07/26/2016 CLINICAL DATA:  Altered mental status EXAM: CT HEAD WITHOUT CONTRAST TECHNIQUE: Contiguous axial images were obtained from the base of the skull through the vertex without intravenous contrast. COMPARISON:  Head CT 07/25/2016 FINDINGS: Brain: Intraparenchymal hematoma centered in the left basal ganglia is unchanged in size, measuring 5.3 x 2.4 cm. The degree of surrounding edema is also unchanged. No new areas of hemorrhage is seen. Rightward midline shift measures 4 mm, slightly increased. There is mass effect on left lateral ventricle. No hydrocephalus. Vascular: No hyperdense vessel or unexpected calcification. Skull: Normal. Negative for fracture or focal lesion. Sinuses/Orbits: No acute finding. Other: None. IMPRESSION: 1. Unchanged size of left basal ganglia centered intraparenchymal hematoma. 2. Slightly increased mass effect with midline shift now measuring 4 mm to the right. 3. No new area of hemorrhage.  No  hydrocephalus. Electronically Signed   By: Deatra Robinson M.D.   On: 07/26/2016 04:00   Ct Head Wo Contrast  Result Date: 07/25/2016 CLINICAL DATA:  Intracranial hemorrhage EXAM: CT HEAD WITHOUT CONTRAST TECHNIQUE: Contiguous axial images were obtained from the base of the skull through the vertex without intravenous contrast. COMPARISON:  head CT 07/24/2016 FINDINGS: Despite efforts by the technologist and patient, motion artifact is present on today's examination and could not be eliminated. The findings of this study are interpreted in the context of that limitation. Intraparenchymal hematoma centered in the left basal ganglia measures 5.2 x 2.4 cm, unchanged in size when allowing for differences in slice selection. The degree of surrounding edema is unchanged. Unchanged 3 mm of rightward midline shift. IMPRESSION: 1. Motion degraded examination. He ordered a CTA of the head and neck could not be performed and IV contrast was not administered. 2. Unchanged size of intraparenchymal hematoma centered in the left basal ganglia with unchanged rightward midline shift measuring 3 mm. Electronically Signed   By: Deatra Robinson M.D.   On: 07/25/2016 05:38   Ct Head Wo Contrast  Result Date: 07/18/2016 CLINICAL DATA:  Altered mental status.  Disoriented.  Combative. EXAM: CT HEAD WITHOUT CONTRAST TECHNIQUE: Contiguous axial images were obtained from the base of the skull through the vertex without intravenous contrast. COMPARISON:  None. FINDINGS: Brain: Motion degraded examination. Allowing for that, the brain appears normal without evidence of atrophy, mass, hemorrhage, hydrocephalus or extra-axial  collection. One could question some chronic small-vessel change of the white matter. Sensitivity is limited. Vascular: There is atherosclerotic calcification of the major vessels at the base of the brain. Skull: Negative Sinuses/Orbits: Clear/normal Other: None significant IMPRESSION: Markedly motion degraded exam. No  abnormality visible by CT. This rules out any large intracranial hemorrhage, hydrocephalus or mass effect/shift. There may be some chronic small-vessel changes of the white matter, but the examination is quite limited. Electronically Signed   By: Paulina Fusi M.D.   On: 07/18/2016 07:11   Ct Cervical Spine Wo Contrast  Result Date: 07/24/2016 CLINICAL DATA:  Recent fall with known intracranial hemorrhage EXAM: CT CERVICAL SPINE WITHOUT CONTRAST TECHNIQUE: Multidetector CT imaging of the cervical spine was performed without intravenous contrast. Multiplanar CT image reconstructions were also generated. COMPARISON:  None. FINDINGS: Alignment: Normal. Skull base and vertebrae: 7 cervical segments are well visualized. Vertebral body height is well maintained. No acute fracture or acute facet abnormality is noted. Mild osteophytic changes are noted from C4-C7 predominately posteriorly. Soft tissues and spinal canal: No prevertebral fluid or swelling. No visible canal hematoma. Disc levels:  No specific disc pathology is noted. Upper chest: Within normal limits. IMPRESSION: Mild degenerative change without acute abnormality. Electronically Signed   By: Alcide Clever M.D.   On: 07/24/2016 16:23   Ct Lumbar Spine W Contrast  Result Date: 07/19/2016 CLINICAL DATA:  Back pain.  History of surgery.  Encephalopathy. EXAM: CT LUMBAR SPINE WITH CONTRAST TECHNIQUE: Multidetector CT imaging of the lumbar spine was performed with intravenous contrast administration. CONTRAST:  100 mL Isovue-300. COMPARISON:  Plain films 03/10/2016.  MRI lumbar spine 11/24/2003. FINDINGS: Segmentation: Standard Alignment: Degenerative scoliosis convex LEFT approximately 15 degrees centered at L3-L4. Vertebrae: There is arthrodesis across the L4-5 and L5-S1 interspaces. This may be postsurgical. No abnormal enhancement is seen within the spinal canal or paravertebral soft tissues. 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 Vascular calcification. Splenic artery aneurysm. Aortic atherosclerosis with cross-sectional measurements of the infrarenal abdominal aorta 25 x 23 mm opposite L4. No hydronephrosis. Disc levels: L1-L2:  Unremarkable. L2-L3:  Unremarkable disc space.  Facet arthropathy. L3-L4: Severe disc space narrowing on the RIGHT with large extraforaminal extrusion accompanied by osseous spurring. Facet arthropathy. LEFT laminotomy. BILATERAL L3 and L4 nerve root impingement are likely, worse on the RIGHT. L4-L5: Interbody arthrodesis. Posterior arthrodesis across the facets. Mild stenosis. No definite subarticular zone narrowing. RIGHT foraminal narrowing could affect the L4 nerve root. L5-S1: Solid interbody and posterior arthrodesis. RIGHT laminotomy. Bony overgrowth contributes to foraminal narrowing bilaterally which could affect either L5 nerve root. Standard IMPRESSION: Solid appearing L4-5 and L5-S1 fusion. Bony foraminal narrowing at these levels could affect the exiting L4 and L5 nerve roots. 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 Severe disc space narrowing, osseous spurring, and extraforaminal protrusion on the RIGHT. RIGHT greater than LEFT L3 and L4 nerve root impingement. Aortic atherosclerosis. Ectatic abdominal aorta at risk for aneurysm development. Recommend followup by ultrasound in 5 years. This recommendation follows ACR consensus guidelines: White Paper of the ACR Incidental Findings Committee II on Vascular Findings. J Am Coll Radiol 2013; 10:789-794. Electronically Signed   By: Elsie Stain M.D.   On: 07/19/2016 07:50   Mr Shirlee Latch XB Contrast  Result Date: 07/28/2016 CLINICAL DATA:  61 year old female with left basal ganglia hemorrhage discovered on 07/24/2016 during evaluation of altered mental status. EXAM: MRI HEAD WITHOUT CONTRAST MRA HEAD WITHOUT CONTRAST TECHNIQUE: Multiplanar, multiecho pulse sequences of the  brain and surrounding structures were obtained without  intravenous contrast. Angiographic images of the head were obtained using MRA technique without contrast. COMPARISON:  Head CTs without contrast 07/26/2016 and earlier. FINDINGS: MRI HEAD FINDINGS Brain: Heterogeneous but mostly hyperintense T1 signal and dark T2 signal intra-axial hemorrhage centered at the left lentiform nuclei encompasses 53 x 25 x 39 mm (AP by transverse by CC), for an estimated blood volume of 26 mL. Size and configuration has not significantly changed since 07/24/2016. Surrounding edema. Regional mass effect with 45 mm of rightward midline shift. Mass effect on the left lateral ventricle, but no intraventricular extension of blood. No extra-axial extension identified. Basilar cisterns remain patent. No restricted diffusion to suggest acute infarction. No ventriculomegaly. Cervicomedullary junction and pituitary are within normal limits. In addition to the edema in the left hemisphere there is patchy in widespread bilateral cerebral white matter T2 and FLAIR hyperintensity in a nonspecific configuration. No cortical encephalomalacia identified. No definite other parenchymal blood products. Brainstem and cerebellum are within normal limits. Vascular: Major intracranial vascular flow voids are preserved, 0 with mild generalized intracranial artery dolichoectasia. Skull and upper cervical spine: Negative. Visualized bone marrow signal is within normal limits. Sinuses/Orbits: Negative orbits soft tissues. Trace paranasal sinus mucosal thickening. Other: Mastoids are clear. Visible internal auditory structures appear normal. Negative scalp soft tissues. MRA HEAD FINDINGS Antegrade flow in the posterior circulation with codominant distal vertebral arteries. Normal PICA origins. Tortuous but otherwise negative vertebrobasilar junction. Tortuous basilar artery without stenosis. Normal SCA and PCA origins. Posterior communicating arteries are diminutive or absent. Mildly tortuous left P1 segment.  Bilateral PCA branches are within normal limits. Posterior communicating arteries are diminutive or absent. Antegrade flow in both ICA siphons. Tortuous distal cervical right ICA. No siphon stenosis. Normal ophthalmic artery origins. Patent carotid termini. MCA and ACA origins are within normal limits. Mildly dominant right ACA A1 segment. Anterior communicating artery within normal limits. Dominant right ACA A2 segment. This somewhat simulates an azygos ACA anatomy. Mildly tortuous bilateral MCA M1 segments. No M1 stenosis. Bilateral MCA bifurcations and visible bilateral MCA branches are within normal limits. IMPRESSION: 1. Acute intra-axial hemorrhage in the left lentiform with estimated blood volume of 26 mL has not significantly changed since 07/24/2016. Surrounding edema and regional mass effect including partial effacement of the left lateral ventricle, but no intraventricular hemorrhage, extra-axial hemorrhage, or ventriculomegaly. 2. Negative intracranial MRA aside from intracranial artery tortuosity. 3. Moderate to severe for age nonspecific cerebral white matter signal changes. Top differential considerations include chronic small vessel disease and chronic demyelinating disease. Electronically Signed   By: Odessa Fleming M.D.   On: 07/28/2016 19:50   Mr Brain Wo Contrast  Result Date: 07/28/2016 CLINICAL DATA:  61 year old female with left basal ganglia hemorrhage discovered on 07/24/2016 during evaluation of altered mental status. EXAM: MRI HEAD WITHOUT CONTRAST MRA HEAD WITHOUT CONTRAST TECHNIQUE: Multiplanar, multiecho pulse sequences of the brain and surrounding structures were obtained without intravenous contrast. Angiographic images of the head were obtained using MRA technique without contrast. COMPARISON:  Head CTs without contrast 07/26/2016 and earlier. FINDINGS: MRI HEAD FINDINGS Brain: Heterogeneous but mostly hyperintense T1 signal and dark T2 signal intra-axial hemorrhage centered at the left  lentiform nuclei encompasses 53 x 25 x 39 mm (AP by transverse by CC), for an estimated blood volume of 26 mL. Size and configuration has not significantly changed since 07/24/2016. Surrounding edema. Regional mass effect with 45 mm of rightward midline shift. Mass effect on the left lateral  ventricle, but no intraventricular extension of blood. No extra-axial extension identified. Basilar cisterns remain patent. No restricted diffusion to suggest acute infarction. No ventriculomegaly. Cervicomedullary junction and pituitary are within normal limits. In addition to the edema in the left hemisphere there is patchy in widespread bilateral cerebral white matter T2 and FLAIR hyperintensity in a nonspecific configuration. No cortical encephalomalacia identified. No definite other parenchymal blood products. Brainstem and cerebellum are within normal limits. Vascular: Major intracranial vascular flow voids are preserved, 0 with mild generalized intracranial artery dolichoectasia. Skull and upper cervical spine: Negative. Visualized bone marrow signal is within normal limits. Sinuses/Orbits: Negative orbits soft tissues. Trace paranasal sinus mucosal thickening. Other: Mastoids are clear. Visible internal auditory structures appear normal. Negative scalp soft tissues. MRA HEAD FINDINGS Antegrade flow in the posterior circulation with codominant distal vertebral arteries. Normal PICA origins. Tortuous but otherwise negative vertebrobasilar junction. Tortuous basilar artery without stenosis. Normal SCA and PCA origins. Posterior communicating arteries are diminutive or absent. Mildly tortuous left P1 segment. Bilateral PCA branches are within normal limits. Posterior communicating arteries are diminutive or absent. Antegrade flow in both ICA siphons. Tortuous distal cervical right ICA. No siphon stenosis. Normal ophthalmic artery origins. Patent carotid termini. MCA and ACA origins are within normal limits. Mildly dominant  right ACA A1 segment. Anterior communicating artery within normal limits. Dominant right ACA A2 segment. This somewhat simulates an azygos ACA anatomy. Mildly tortuous bilateral MCA M1 segments. No M1 stenosis. Bilateral MCA bifurcations and visible bilateral MCA branches are within normal limits. IMPRESSION: 1. Acute intra-axial hemorrhage in the left lentiform with estimated blood volume of 26 mL has not significantly changed since 07/24/2016. Surrounding edema and regional mass effect including partial effacement of the left lateral ventricle, but no intraventricular hemorrhage, extra-axial hemorrhage, or ventriculomegaly. 2. Negative intracranial MRA aside from intracranial artery tortuosity. 3. Moderate to severe for age nonspecific cerebral white matter signal changes. Top differential considerations include chronic small vessel disease and chronic demyelinating disease. Electronically Signed   By: Odessa Fleming M.D.   On: 07/28/2016 19:50   Dg Abd Portable 1v  Result Date: 07/29/2016 CLINICAL DATA:  Check feeding catheter placement EXAM: PORTABLE ABDOMEN - 1 VIEW COMPARISON:  07/27/2016 FINDINGS: Feeding catheter is again noted with its tip in the distal stomach. This is similar to that seen on the prior exam. Nonobstructive bowel gas pattern is noted. IMPRESSION: Feeding catheter in the distal stomach. Electronically Signed   By: Alcide Clever M.D.   On: 07/29/2016 13:53   Dg Abd Portable 1v  Result Date: 07/27/2016 CLINICAL DATA:  Feeding tube placement EXAM: PORTABLE ABDOMEN - 1 VIEW COMPARISON:  None. FINDINGS: There is normal small bowel gas pattern. There is NG feeding tube with tip in distal stomach/pyloric region. IMPRESSION: NG feeding tube with tip in distal stomach/ pyloric region. Electronically Signed   By: Natasha Mead M.D.   On: 07/27/2016 11:21   Dg Hip Unilat W Or Wo Pelvis 2-3 Views Left  Result Date: 07/24/2016 CLINICAL DATA:  61 y/o F; status post fall with pain of either the left hip  were left knee. EXAM: DG HIP (WITH OR WITHOUT PELVIS) 2-3V LEFT COMPARISON:  None. FINDINGS: No acute fracture or dislocation is identified. If symptoms persist or if clinically indicated MRI is more sensitive for acute hip fracture. Vascular calcifications. Mild osteoarthrosis of sacroiliac joints and symphysis pubis. IMPRESSION: No acute fracture or dislocation is identified. If symptoms persist or if clinically indicated MRI is more sensitive for acute  hip fracture. Electronically Signed   By: Mitzi Hansen M.D.   On: 07/24/2016 18:33   Ct Head Code Stroke Wo Contrast  Result Date: 07/24/2016 CLINICAL DATA:  Code stroke. Code stroke. Additional symptoms not described. Altered mental status. EXAM: CT HEAD WITHOUT CONTRAST TECHNIQUE: Contiguous axial images were obtained from the base of the skull through the vertex without intravenous contrast. COMPARISON:  07/18/2016 FINDINGS: Brain: The patient has developed intraparenchymal hematoma in the left basal ganglia measuring 4.8 x 3.5 x 2.7 cm (volume = 24 cm^3). Mild surrounding edema. Mild mass effect with left-to-right shift of 2 mm. 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 No intraventricular penetration. No subarachnoid penetration. No hydrocephalus or extra-axial collection. Vascular: There is atherosclerotic calcification of the major vessels at the base of the brain. Skull: Normal Sinuses/Orbits: Clear/normal Other: None ASPECTS (Alberta Stroke Program Early CT Score) - Ganglionic level infarction (caudate, lentiform nuclei, internal capsule, insula, M1-M3 cortex): 7 - Supraganglionic infarction (M4-M6 cortex): 3 Total score (0-10 with 10 being normal): 10 IMPRESSION: 1. Intraparenchymal hemorrhage in the left basal ganglia measuring 4.8 x 2.7 x 3.5 cm, volume 24 cc. Mild surrounding edema. Mild mass effect with left-to-right shift of 2 mm. Old right frontal infarction. 2. Page was placed by telephone at the time of interpretation on 07/24/2016 at 4:11 pm to  Dr. Jonna Munro. 3. ASPECTS is 10 Electronically Signed   By: Paulina Fusi M.D.   On: 07/24/2016 16:16    Assessment/Plan  Acute encephalopathy Likely multifactorial. Her UTI is causing this. Her polypharmacy and deconditioning are also likely contributing. Start her empirically on antibiotic as below. Currently on depakote sprinkles tid, hold this for now until she is more awake and start it as daily following that. Change her baclofen to bid prn only from scheduled. Continue tylenol as needed only for pain for now. CT head was negative for acute intracranial abnormality. Monitor clinically with vital signs q 4 hr x 48 hrs.   UTI Lab work from hospital shows elevated wbc and dirty U/A ith culture growing >100,000 colonies of bacteria. She is allergic to penicillin. Start her on nitrofurantoin 100 mg q12h x 1 week. Maintain hydration and perineal hygiene. Monitor urine output. Will need to follow up on urine culture.   Leukocytosis Afebrile. Her UTI is likely contributing to this. Maintain hydration and start antibiotic as above.   HTN Soft BP, lisinopril was held this morning for low BP. Discontinue lisinopril. Continue lasix and check BP and BMP.    Labs/tests ordered: cbc, cmp 08/19/16  Family/ staff Communication: reviewed care plan with patient and nursing supervisor    Oneal Grout, MD Internal Medicine [facility] [address] [place], [place] [address] Cell Phone (Monday-Friday 8 am - 5 pm): [phone] On Call: [phone] and follow prompts after 5 pm and on weekends Office Phone: [phone] Office Fax: [phone]

## 2016-08-17 LAB — URINE CULTURE

## 2016-08-18 ENCOUNTER — Telehealth: Payer: Self-pay

## 2016-08-18 NOTE — Telephone Encounter (Signed)
Post ED Visit - Positive Culture Follow-up  Culture report reviewed by antimicrobial stewardship pharmacist:  []  Enzo BiNathan Batchelder, Pharm.D. []  Celedonio MiyamotoJeremy Frens, Pharm.D., BCPS AQ-ID []  Garvin FilaMike Maccia, Pharm.D., BCPS []  Georgina PillionElizabeth Martin, Pharm.D., BCPS []  La CroftMinh Pham, [address] Rainbow BoulevardPharm.D., BCPS, AAHIVP []  Estella HuskMichelle Turner, Pharm.D., BCPS, AAHIVP []  Lysle Pearlachel Rumbarger, PharmD, BCPS []  Casilda Carlsaylor Stone, PharmD, BCPS []  Pollyann SamplesAndy Johnston, PharmD, BCPS [place] HunAllison Masters Pharm D Positive urine culture Treated with Macrobid, organism sensitive to the same and no further patient follow-up is required at this time.  Jerry CarasCullom, Eliska Hamil Burnett 08/18/2016, 9:36 AM

## 2016-08-20 LAB — BASIC METABOLIC PANEL
BUN: 16 mg/dL (ref 4–21)
Creatinine: 0.7 mg/dL (ref 0.5–1.1)
Glucose: 105 mg/dL
POTASSIUM: 3.7 mmol/L (ref 3.4–5.3)
SODIUM: 136 mmol/L — AB (ref 137–147)

## 2016-08-20 LAB — CBC AND DIFFERENTIAL
HEMATOCRIT: 45 % (ref 36–46)
HEMOGLOBIN: 15.1 g/dL (ref 12.0–16.0)
Neutrophils Absolute: 7 /uL
Platelets: 226 10*3/uL (ref 150–399)
WBC: 12.3 10^3/mL

## 2016-08-20 LAB — HEPATIC FUNCTION PANEL
ALK PHOS: 95 U/L (ref 25–125)
ALT: 22 U/L (ref 7–35)
AST: 19 U/L (ref 13–35)
Bilirubin, Total: 0.5 mg/dL

## 2016-08-20 LAB — CULTURE, BLOOD (ROUTINE X 2)
CULTURE: NO GROWTH
Culture: NO GROWTH
SPECIAL REQUESTS: ADEQUATE
Special Requests: ADEQUATE

## 2016-08-21 ENCOUNTER — Encounter: Payer: Self-pay | Admitting: Adult Health

## 2016-08-21 NOTE — Progress Notes (Signed)
This encounter was created in error - please disregard.

## 2016-08-22 ENCOUNTER — Encounter: Payer: Self-pay | Admitting: Adult Health

## 2016-08-22 ENCOUNTER — Non-Acute Institutional Stay (SKILLED_NURSING_FACILITY): Payer: Medicare HMO | Admitting: Internal Medicine

## 2016-08-22 ENCOUNTER — Encounter: Payer: Self-pay | Admitting: Internal Medicine

## 2016-08-22 DIAGNOSIS — J189 Pneumonia, unspecified organism: Secondary | ICD-10-CM

## 2016-08-22 DIAGNOSIS — J181 Lobar pneumonia, unspecified organism: Secondary | ICD-10-CM | POA: Diagnosis not present

## 2016-08-22 DIAGNOSIS — N390 Urinary tract infection, site not specified: Secondary | ICD-10-CM | POA: Diagnosis not present

## 2016-08-22 NOTE — Progress Notes (Signed)
This encounter was created in error - please disregard.

## 2016-08-22 NOTE — Progress Notes (Signed)
Patient ID: Heidi Green, female   DOB: December 19, 1955, 61 y.o.   MRN: [id number]   This is an acute visit.  Level care skilled.  Facility is  [employer] complaint-acute visit secondary to chest x-ray showing possible pneumonia.  History of present illness.  Patient is a 61 year old female, she is here for short term rehabilitation post hospital admission from 07/24/16-08/01/16 with altered mental state from left intraparenchymal hemorrhage with right upper extremity weakness and right facial droop.   Apparently she was noted possibly have a cough and chest x-ray was ordered which shows mild atelectasis changes versus pneumonia right lower lobe.  She is currently afebrile O2 sats ration is in the low 90s on room air she does not appear to be in any distress she does have some history of chronic pain and x-ray was concern for sedation she went actually to the ER proximally week ago and no suggestion for titrating down her narcotics..  Her baclofen has been changed to when necessary status-she continues on Tylenol as needed for pain has an order for hydrocodone for more severe pain when necessary every 8 hours  Currently she is not complaining of shortness breath or cough although she does not speak much during largely 1 or 2 word answers to questions.  Of note she is finishing treatment with Macrobid for UTI specimen showed aerococcus urinae  She is not really complaining of dysuria today but will recheck a culture here.  Past Medical History:  Diagnosis Date  . Arthritis    in back  . Back pain   . Chest pain 06/16/2012   negative cath with normal LV function  . Headache(784.0)   . Hypertension   . Kidney stones         Past Surgical History:  Procedure Laterality Date  . ABDOMINAL HYSTERECTOMY    . BACK SURGERY    . LEFT HEART CATHETERIZATION WITH CORONARY ANGIOGRAM N/A 06/16/2012   Procedure: LEFT HEART CATHETERIZATION WITH CORONARY ANGIOGRAM;  Surgeon:  Peter M Swaziland, MD;  Location: [facility] CATH LAB;  Service: Cardiovascular;  Laterality: N/A;  . LITHOTRIPSY    . TUBAL LIGATION     Social History:   reports that she quit smoking about 4 years ago. Her smoking use included Cigarettes. She has a 13.00 pack-year smoking history. She has never used smokeless tobacco. She reports that she does not drink alcohol or use drugs.       Family History  Problem Relation Age of Onset  . Emphysema Mother   . Diabetes Mother     Medications:     Allergies as of 08/16/2016      Reactions   Garamycin [gentamicin Sulfate] Hives, Shortness Of Breath   Penicillins Anaphylaxis, Itching, Swelling   Has patient had a PCN reaction causing immediate rash, facial/tongue/throat swelling, SOB or lightheadedness with hypotension: Yes Has patient had a PCN reaction causing severe rash involving mucus membranes or skin necrosis: No Has patient had a PCN reaction that required hospitalization No Has patient had a PCN reaction occurring within the last 10 years: No If all of the above answers are "NO", then may proceed with Cephalosporin use.   Norvasc [amlodipine Besylate] Nausea And Vomiting               Medication List           Accurate as of 08/16/16  2:50 PM. Always use your most recent med list.  acetaminophen 325 MG tablet Commonly known as:  TYLENOL Take 2 tablets (650 mg total) by mouth every 4 (four) hours as needed for mild pain (or temp > 37.5 C (99.5 F)). Combination of acetaminophen in vicodin and tylenol should not exceed max dose of 4000mg  daily   baclofen 10 MG tablet Commonly known as:  LIORESAL Take 10 mg by mouth every 12 hours when necessary muscle spasm   cyanocobalamin 1000 MCG/ML injection Commonly known as:  (VITAMIN B-12) Inject 1,000 mcg into the muscle every 30 (thirty) days.       fluticasone 50 MCG/ACT nasal spray Commonly known as:  FLONASE Place 2 sprays into both nostrils daily.    furosemide 20 MG tablet Commonly known as:  LASIX Take 20 mg by mouth daily.   lisinopril 5 MG tablet Commonly known as:  PRINIVIL,ZESTRIL Take 5 mg by mouth daily. Hold for SBP <110   loratadine 10 MG tablet Commonly known as:  CLARITIN Take 10 mg by mouth daily.   NUTRITIONAL SUPPLEMENT Liqd Take 120 mLs by mouth 2 (two) times daily. MedPass   NUTRITIONAL SUPPLEMENT PO Take 1 each by mouth daily. Magic Cup   nystatin-triamcinolone cream Commonly known as:  MYCOLOG II Apply 1 application topically 2 (two) times daily. Apply to left arm rash until healed   potassium chloride 10 MEQ tablet Commonly known as:  K-DUR,KLOR-CON Take 10 mEq by mouth 2 (two) times daily.   rOPINIRole 2 MG tablet Commonly known as:  REQUIP Take 2 mg by mouth 2 (two) times daily.   senna-docusate 8.6-50 MG tablet Commonly known as:  Senokot-S Take 1 tablet by mouth 2 (two) times daily.     Hydrocodone A CET-2.5-325 mg-one tablet every 8 hours when necessary moderate pain  Immunizations:     Immunization History  Administered Date(s) Administered  . PPD Test 08/01/2016     Review of systems this is somewhat limited.  In general she's not complaining fever or chills.  Skin does not complain of rashes or itching head ears eyes nose mouth and throat does not complain a sore throat.  Respiratory apparently has a cough but does not really complain specifically of shortness of breath.  Cardiac does not complain of chest pain.  GI does not complain of abdominal discomfort.  Muscle skeletal does complain of chronic back pain.  Neurologic is not complaining of dizziness or headache.  Psych does appear to have at times some anxiety  Physical Exam:    Temperature is 96.6 pulse 100 respirations 20 blood pressure 130/89 O2 saturation is 91% on room air                                 General- adult female, obese in no acute distress Head- normocephalic,  atraumatic Nose- no nasal discharge Throat- moist mucus membrane Eyes- PERRLA, EOMI, no pallor, no icterus, no discharge, normal conjunctiva, normal sclera Neck- no cervical lymphadenopathy Cardiovascular- normalRegular rate and rhythm s1,s2, no murmur--trace bilateral lower extremity edema Respiratory- bilateral clear to auscultation, no wheeze, no rhonchi, no crackles, no use of accessory muscles however respiratory effort is rather poor with shallow air entry Abdomen- bowel sounds present, soft, non tender, no guarding or rigidity Musculoskeletal- able to move all 4 extremities, generalized weakness most prominent to right side Neurological-she is alert Skin- warm and dry, no diaphoresis   Labs reviewed: Basic Metabolic Panel:  Recent Labs  40/98/11 1622 07/28/16 0458  07/28/16 1816  07/30/16 0249 08/01/16 0250 08/05/16 08/15/16 0024  NA  --  139  --   < > 140 138 148* 138  K  --  3.5  --   < > 3.4* 3.0* 3.8 4.1  CL  --  108  --   < > 109 107  --  107  CO2  --  23  --   < > 20* 22  --  21*  GLUCOSE  --  105*  --   < > 104* 98  --  113*  BUN  --  23*  --   < > 12 9 36* 37*  CREATININE  --  0.69  --   < > 0.66 0.57 0.6 0.92  CALCIUM  --  8.8*  --   < > 9.1 9.1  --  9.6  MG 1.9 2.0 2.0  --   --   --   --   --   PHOS 3.8 3.8 3.4  --   --   --   --   --   < > = values in this interval not displayed. Liver Function Tests:  Recent Labs  07/18/16 1000 07/24/16 1619 08/05/16 08/15/16 0024  AST 23 30 22 24   ALT 13* 17 29 29   ALKPHOS 93 97 88 90  BILITOT 0.8 0.6  --  0.8  PROT 7.8 7.3  --  7.7  ALBUMIN 4.3 4.3  --  4.3   No results for input(s): LIPASE, AMYLASE in the last 8760 hours.  Recent Labs  07/18/16 1000 08/15/16 0500  AMMONIA 34 21   CBC:  Recent Labs  07/18/16 1000  07/24/16 1619  07/29/16 0231 07/30/16 0249 08/15/16 0024  WBC 13.3*  < > 16.2*  < > 10.8* 10.4 15.1*  NEUTROABS 8.5*  --  13.4*  --   --   --  8.6*  HGB 14.1  < > 14.5  <  > 15.7* 13.6 16.2*  HCT 43.0  < > 44.5  < > 47.8* 41.3 48.1*  MCV 89.8  < > 88.3  < > 86.9 87.5 89.4  PLT 243  < > 239  < > 197 247 250  < > = values in this interval not displayed. Cardiac Enzymes: No results for input(s): CKTOTAL, CKMB, CKMBINDEX, TROPONINI in the last 8760 hours. BNP: Invalid input(s): POCBNP CBG:    Recent Labs  08/01/16 0907 08/01/16 1225 08/15/16 0026  GLUCAP 88 91 102*   System plan.  #1 chest x-ray suspicious for possible pneumonia-we will start a short course of Avelox 400 mg daily for 5 days as well as a probiotic twice a day for 7 days.  Also will obtain a CBC with differential and metabolic panel tomorrow to keep an eye on her white count as well as electrolytes and renal function.  Will add Mucinex 600 mg twice a day for 5 days.  And add when necessary Atrovent nebs every 6 hours when necessary for 5 days  And monitor vital signs pulse ox every shift for 72 hours  #2 UTI again she is finishing a course of Macrobid will update a urine culture as well.  [id number]

## 2016-08-26 LAB — CBC AND DIFFERENTIAL
HEMATOCRIT: 43 % (ref 36–46)
HEMOGLOBIN: 14.1 g/dL (ref 12.0–16.0)
NEUTROS ABS: 6 /uL
Platelets: 211 10*3/uL (ref 150–399)
WBC: 10.8 10^3/mL

## 2016-08-26 LAB — BASIC METABOLIC PANEL
BUN: 16 mg/dL (ref 4–21)
Creatinine: 0.5 mg/dL (ref 0.5–1.1)
Glucose: 98 mg/dL
Potassium: 3.5 mmol/L (ref 3.4–5.3)
SODIUM: 140 mmol/L (ref 137–147)

## 2016-08-28 ENCOUNTER — Ambulatory Visit (INDEPENDENT_AMBULATORY_CARE_PROVIDER_SITE_OTHER): Payer: Commercial Managed Care - HMO | Admitting: Orthopaedic Surgery

## 2016-08-28 ENCOUNTER — Encounter (INDEPENDENT_AMBULATORY_CARE_PROVIDER_SITE_OTHER): Payer: Self-pay | Admitting: Orthopaedic Surgery

## 2016-08-28 ENCOUNTER — Encounter (INDEPENDENT_AMBULATORY_CARE_PROVIDER_SITE_OTHER): Payer: Self-pay

## 2016-08-28 VITALS — BP 95/55 | HR 60 | Ht 66.0 in | Wt 209.0 lb

## 2016-08-28 DIAGNOSIS — M545 Low back pain: Secondary | ICD-10-CM

## 2016-08-28 DIAGNOSIS — G8929 Other chronic pain: Secondary | ICD-10-CM | POA: Diagnosis not present

## 2016-08-28 NOTE — Progress Notes (Signed)
Office Visit Note   Patient: Heidi Green           Date of Birth: 1955-05-24           MRN: [id number] Visit Date: 08/28/2016              Requested by: Karle Plumber, MD [phone] [place], [place] [address] PCP: Karle Plumber, MD   Assessment & Plan: Visit Diagnoses:  1. Chronic midline low back pain without sciatica     Plan: Reviewed the previous CT scan previous images plain radiographs with patient and her husband. She does not have any significant hip disease for previous x-rays that were obtained. CT scan shows adequate decompression at L2-3 L3-4. Previous surgery L5-S1. Autofusion at L4-5 and L5-S1. 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 From her decompression she does not have residual stenosis centrally or in the lateral recess. She does have some foraminal stenosis. Chest some narrowing at L2-3 and the foramina and there are multiple medical problems does not really good candidate for an L3-4 instrumented fusion which would be needed since she has some listhesis as well as by foraminal stenosis. She can work on the standing used her lumbar brace intermittently continue to try to improve after CVA. She can return on a when necessary basis.  Follow-Up Instructions: No Follow-up on file.   Orders:  No orders of the defined types were placed in this encounter.  No orders of the defined types were placed in this encounter.     Procedures: No procedures performed   Clinical Data: No additional findings.   Subjective: Chief Complaint  Patient presents with  . Lower Back - Pain    HPI 61 year old female minute appointment reevaluated here at the beginning of April. She's had a CT scan lumbar which shows previous lumbar surgeries with decompression surgeries and autofusion at L4-5 and L5-S1. On 07/24/2016 she had a CVA and was admitted to the hospital with  the residual right-sided weakness and right facial droop. She's made the improvement and at this point is just standing with therapy but is not walking. She is here with her husband. Previous CT showed adjacent segment disease at L3-4 with endplate irregularity extraforaminal protrusion on the right with some left L3 and L4 nerve root narrowing and some lateral listhesis with lumbar curvature as well. She had previous L2-3 and L3-4 laminectomies by Dr. Kendell Bane [facility]. Other surgery some years prior to that at L5-S1. 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 She states she gets some relief with supine position her pain is somewhat constant. She's had the narcotic medicine in the past and recently was a decrease in her dose due to questions about oversedation.  Review of Systems patient's had the non-STEMI MI. CVA 07/24/2016 still in skilled facility care. History of tobacco use chronic back pain several back surgeries history of hypertensive emergency, hyperlipidemia, history of noncompliance. Restoril 14 point review of systems is noncontributory to her history of present illness.   Objective: Vital Signs: BP (!) 95/55   Pulse 60   Ht 5\' 6"  (1.676 m)   Wt 209 lb (94.8 kg)   BMI 33.73 kg/m   Physical Exam  Constitutional: She is oriented to person, place, and time. She appears well-developed.  HENT:  Head: Normocephalic.  Right Ear: External ear normal.  Left Ear: External ear normal.  Eyes: Pupils are equal, round, and reactive to light.  Neck: No tracheal deviation present. No thyromegaly present.  Cardiovascular: Normal rate.   Pulmonary/Chest: Effort normal.  Abdominal: Soft.  Musculoskeletal:  Patient's partial foot drop on the right. Anterior tib fires that she can invert foot but cannot dorsiflex in neutral position. The extensors EHL are nonactive. She can plantarflex. Slight edema both lower extremities. Faint palpable  pulses. Negative straight leg raising 90 no pain with hip range of motion. There is a right facial droop with some drooling on the right side. Minimal ability to use the right upper extremity. Patient's in a wheelchair.  Neurological: She is alert and oriented to person, place, and time.  Skin: Skin is warm and dry.  Psychiatric: She has a normal mood and affect. Her behavior is normal.    Ortho Exam  Specialty Comments:  No specialty comments available.  Imaging: CLINICAL DATA:  Back pain.  History of surgery.  Encephalopathy.  EXAM: CT LUMBAR SPINE WITH CONTRAST  TECHNIQUE: Multidetector CT imaging of the lumbar spine was performed with intravenous contrast administration.  CONTRAST:  100 mL Isovue-300.  COMPARISON:  Plain films 03/10/2016.  MRI lumbar spine 11/24/2003.  FINDINGS: Segmentation: Standard  Alignment: Degenerative scoliosis convex LEFT approximately 15 degrees centered at L3-L4.  Vertebrae: There is arthrodesis across the L4-5 and L5-S1 interspaces. This may be postsurgical. No abnormal enhancement is seen within the spinal canal or paravertebral soft tissues.  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 Vascular calcification. Splenic artery aneurysm. Aortic atherosclerosis with cross-sectional measurements of the infrarenal abdominal aorta 25 x 23 mm opposite L4. No hydronephrosis.  Disc levels:  L1-L2:  Unremarkable.  L2-L3:  Unremarkable disc space.  Facet arthropathy.  L3-L4: Severe disc space narrowing on the RIGHT with large extraforaminal extrusion accompanied by osseous spurring. Facet arthropathy. LEFT laminotomy. BILATERAL L3 and L4 nerve root impingement are likely, worse on the RIGHT.  L4-L5: Interbody arthrodesis. Posterior arthrodesis across the facets. Mild stenosis. No definite subarticular zone narrowing. RIGHT foraminal narrowing could affect the L4 nerve root.  L5-S1: Solid  interbody and posterior arthrodesis. RIGHT laminotomy. Bony overgrowth contributes to foraminal narrowing bilaterally which could affect either L5 nerve root. Standard  IMPRESSION: Solid appearing L4-5 and L5-S1 fusion. Bony foraminal narrowing at these levels could affect the exiting L4 and L5 nerve roots.  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 Severe disc space narrowing, osseous spurring, and extraforaminal protrusion on the RIGHT. RIGHT greater than LEFT L3 and L4 nerve root impingement.  Aortic atherosclerosis. Ectatic abdominal aorta at risk for aneurysm development. Recommend followup by ultrasound in 5 years. This recommendation follows ACR consensus guidelines: White Paper of the ACR Incidental Findings Committee II on Vascular Findings. J Am Coll Radiol 2013; 10:789-794.   Electronically Signed   By: Elsie StainJohn T Curnes M.D.   On: 07/19/2016 07:50   PMFS History: Patient Active Problem List   Diagnosis Date Noted  . Hypertensive emergency 08/01/2016  . Dysphagia following nontraumatic intracerebral hemorrhage 08/01/2016  . Hypokalemia 08/01/2016  . Agitation 08/01/2016  . Non-compliance 08/01/2016  . Allergic rhinitis 08/01/2016  . 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 Class 2 obesity   . Acute encephalopathy 07/18/2016  . NSTEMI (non-ST elevated myocardial infarction) (HCC) 06/17/2012  . Chest pain 06/16/2012  . HTN (hypertension) 06/16/2012  . Dyslipidemia 06/16/2012  . Back pain 06/16/2012  .  Tobacco abuse 06/16/2012   Past Medical History:  Diagnosis Date  . Arthritis    in back  . Back pain   . Chest pain 06/16/2012   negative cath with normal LV function  . Headache(784.0)   . Hypertension   . Kidney stones     Family History  Problem Relation Age of Onset  . Emphysema Mother   . Diabetes Mother     Past Surgical History:  Procedure Laterality Date  . ABDOMINAL HYSTERECTOMY    . BACK SURGERY    . LEFT  HEART CATHETERIZATION WITH CORONARY ANGIOGRAM N/A 06/16/2012   Procedure: LEFT HEART CATHETERIZATION WITH CORONARY ANGIOGRAM;  Surgeon: Peter M Swaziland, MD;  Location: [facility] CATH LAB;  Service: Cardiovascular;  Laterality: N/A;  . LITHOTRIPSY    . TUBAL LIGATION     Social History   Occupational History  . Not on file.   Social History Main Topics  . Smoking status: Former Smoker    Packs/day: 0.50    Years: 26.00    Types: Cigarettes    Quit date: 06/17/2012  . Smokeless tobacco: Never Used  . Alcohol use No  . Drug use: No  . Sexual activity: Not Currently

## 2016-09-02 ENCOUNTER — Non-Acute Institutional Stay (SKILLED_NURSING_FACILITY): Payer: Commercial Managed Care - HMO | Admitting: Adult Health

## 2016-09-02 ENCOUNTER — Encounter: Payer: Self-pay | Admitting: Adult Health

## 2016-09-02 DIAGNOSIS — R531 Weakness: Secondary | ICD-10-CM | POA: Diagnosis not present

## 2016-09-02 DIAGNOSIS — R2681 Unsteadiness on feet: Secondary | ICD-10-CM

## 2016-09-02 DIAGNOSIS — I610 Nontraumatic intracerebral hemorrhage in hemisphere, subcortical: Secondary | ICD-10-CM

## 2016-09-02 DIAGNOSIS — G4700 Insomnia, unspecified: Secondary | ICD-10-CM | POA: Diagnosis not present

## 2016-09-02 DIAGNOSIS — N390 Urinary tract infection, site not specified: Secondary | ICD-10-CM | POA: Diagnosis not present

## 2016-09-02 DIAGNOSIS — G2581 Restless legs syndrome: Secondary | ICD-10-CM

## 2016-09-02 DIAGNOSIS — E876 Hypokalemia: Secondary | ICD-10-CM

## 2016-09-02 DIAGNOSIS — I10 Essential (primary) hypertension: Secondary | ICD-10-CM

## 2016-09-02 DIAGNOSIS — G8929 Other chronic pain: Secondary | ICD-10-CM

## 2016-09-02 NOTE — Progress Notes (Signed)
DATE:  09/02/2016   MRN:  [id number]  BIRTHDAY: 1955/10/22  Facility:  Nursing Home Location:  [facility]  Nursing Home Room Number: 1206-P  LEVEL OF CARE:  SNF (31)  Contact Information    Name Relation Home Work Mobile   Heidi Green Spouse   [phone]       Code Status History    Date Active Date Inactive Code Status Order ID Comments User Context   07/24/2016  6:40 PM 08/01/2016  6:18 PM Full Code [id number]  Heidi Green Inpatient   07/18/2016  7:05 AM 07/19/2016  3:11 PM Full Code [id number]  Heidi Clos, MD ED   06/16/2012  7:58 AM 06/17/2012  8:38 PM Full Code [id number]  Heidi Norman, MD Inpatient       Chief Complaint  Patient presents with  . Discharge Note    HISTORY OF PRESENT ILLNESS:  This is a 60-YO female seen for discharge.  She will discharge home 09/04/2016, and will be followed by home health for OT, PT, and Nursing.  She has been re-admitted to [facility] on 08/15/16 from [facility] with hypotension. When she came back to facility, her Norco was discontinued, decreased dose of Baclofen, Lasix and Lisinopril. Of note, she was having a short-term rehabilitation @ [facility] for a previous hospitalization from [facility] admission dates 07/24/16 thru 08/01/16 with altered mental state. She was diagnosed with left intraparenchymal hemorrhage with right upper extremity weakness and right facial droop. Her stroke was thought to be from hypertensive emergency and that her antihypertensive medications were adjusted. She has PMH of hypertension, chronic pain and arthritis.  Patient was admitted to this facility for short-term rehabilitation after the patient's recent hospitalization.  Patient has completed SNF rehabilitation and therapy has cleared the patient for discharge.   PAST MEDICAL HISTORY:  Past Medical History:  Diagnosis Date  . Arthritis    in back  . Back pain     . Chest pain 06/16/2012   negative cath with normal LV function  . Headache(784.0)   . Hypertension   . Kidney stones      CURRENT MEDICATIONS: Reviewed  Patient's Medications  New Prescriptions   No medications on file  Previous Medications   ACETAMINOPHEN (TYLENOL) 325 MG TABLET    Take 2 tablets (650 mg total) by mouth every 4 (four) hours as needed for mild pain (or temp > 37.5 C (99.5 F)). Combination of acetaminophen in vicodin and tylenol should not exceed max dose of 4000mg  daily   BACLOFEN (LIORESAL) 10 MG TABLET    Take 10 mg by mouth every 12 (twelve) hours as needed.    CYANOCOBALAMIN (,VITAMIN B-12,) 1000 MCG/ML INJECTION    Inject 1,000 mcg into the muscle every 30 (thirty) days.    FLUTICASONE (FLONASE) 50 MCG/ACT NASAL SPRAY    Place 2 sprays into both nostrils daily.    FUROSEMIDE (LASIX) 20 MG TABLET    Take 20 mg by mouth daily.   HYDROCODONE-ACETAMINOPHEN 2.5-325 MG TABS    Take 1 tablet by mouth every 8 (eight) hours as needed.   LORATADINE (CLARITIN) 10 MG TABLET    Take 10 mg by mouth daily.   MELATONIN 5 MG TABS    Take 5 mg by mouth at bedtime.   MENTHOL, TOPICAL ANALGESIC, (BIOFREEZE) 4 % GEL    Apply 1 application topically every 8 (eight) hours as needed (pain). Apply  to back   NUTRITIONAL SUPPLEMENT LIQD    Take 120 mLs by mouth 2 (two) times daily. MedPass   NUTRITIONAL SUPPLEMENTS (NUTRITIONAL SUPPLEMENT PO)    Take 1 each by mouth daily. Magic Cup   POTASSIUM CHLORIDE (K-DUR,KLOR-CON) 10 MEQ TABLET    Take 10 mEq by mouth 2 (two) times daily.   ROPINIROLE (REQUIP) 2 MG TABLET    Take 2 mg by mouth 2 (two) times daily.    SENNA-DOCUSATE (SENOKOT-S) 8.6-50 MG TABLET    Take 1 tablet by mouth 2 (two) times daily.   SULFAMETHOXAZOLE-TRIMETHOPRIM (BACTRIM DS,SEPTRA DS) 800-160 MG TABLET    Take 1 tablet by mouth 2 (two) times daily.  Modified Medications   No medications on file  Discontinued Medications   No medications on file     Allergies  Allergen  Reactions  . Garamycin [Gentamicin Sulfate] Hives and Shortness Of Breath  . Penicillins Anaphylaxis, Itching and Swelling    Has patient had a PCN reaction causing immediate rash, facial/tongue/throat swelling, SOB or lightheadedness with hypotension: Yes Has patient had a PCN reaction causing severe rash involving mucus membranes or skin necrosis: No Has patient had a PCN reaction that required hospitalization No Has patient had a PCN reaction occurring within the last 10 years: No If all of the above answers are "NO", then may proceed with Cephalosporin use.   . Norvasc [Amlodipine Besylate] Nausea And Vomiting     REVIEW OF SYSTEMS:  GENERAL: no change in appetite, no fatigue, no weight changes, no fever, chills or weakness EYES: Denies change in vision, dry eyes, eye pain, itching or discharge EARS: Denies change in hearing, ringing in ears, or earache NOSE: Denies nasal congestion or epistaxis MOUTH and THROAT: Denies oral discomfort, gingival pain or bleeding, pain from teeth or hoarseness   RESPIRATORY: no cough, SOB, DOE, wheezing, hemoptysis CARDIAC: no chest pain, edema or palpitations GI: no abdominal pain, diarrhea, constipation, heart burn, nausea or vomiting GU: Denies dysuria, frequency, hematuria, incontinence, or discharge PSYCHIATRIC: Denies feeling of depression or anxiety. No report of hallucinations, insomnia, paranoia, or agitation     PHYSICAL EXAMINATION  GENERAL APPEARANCE: Well nourished. In no acute distress. Obese SKIN:  Skin is warm and dry.  HEAD: Normal in size and contour. No evidence of trauma EYES: Lids open and close normally. No blepharitis, entropion or ectropion. PERRL. Conjunctivae are clear and sclerae are white. Lenses are without opacity EARS: Pinnae are normal. Patient hears normal voice tunes of the examiner MOUTH and THROAT: Lips are without lesions. Oral mucosa is moist and without lesions. Tongue is normal in shape, size, and color  and without lesions NECK: supple, trachea midline, no neck masses, no thyroid tenderness, no thyromegaly LYMPHATICS: no LAN in the neck, no supraclavicular LAN RESPIRATORY: breathing is even & unlabored, BS CTAB CARDIAC: RRR, no murmur,no extra heart sounds, no edema GI: abdomen soft, normal BS, no masses, no tenderness, no hepatomegaly, no splenomegaly EXTREMITIES:  Able to move X 4 extremities,  RUE and RLE hemiparesis NEURO:  Dysarthric, right hemiparesis PSYCHIATRIC: Alert and oriented X 3. Affect and behavior are appropriate    LABS/RADIOLOGY: Labs reviewed: Basic Metabolic Panel:  Recent Labs  16/10/96 1622 07/28/16 0458 07/28/16 1816  07/30/16 0249 08/01/16 0250  08/15/16 0024 08/20/16 08/26/16  NA  --  139  --   < > 140 138  < > 138 136* 140  K  --  3.5  --   < > 3.4* 3.0*  < >  4.1 3.7 3.5  CL  --  108  --   < > 109 107  --  107  --   --   CO2  --  23  --   < > 20* 22  --  21*  --   --   GLUCOSE  --  105*  --   < > 104* 98  --  113*  --   --   BUN  --  23*  --   < > 12 9  < > 37* 16 16  CREATININE  --  0.69  --   < > 0.66 0.57  < > 0.92 0.7 0.5  CALCIUM  --  8.8*  --   < > 9.1 9.1  --  9.6  --   --   MG 1.9 2.0 2.0  --   --   --   --   --   --   --   PHOS 3.8 3.8 3.4  --   --   --   --   --   --   --   < > = values in this interval not displayed. Liver Function Tests:  Recent Labs  07/18/16 1000 07/24/16 1619 08/05/16 08/15/16 0024 08/20/16  AST 23 30 22 24 19   ALT 13* 17 29 29 22   ALKPHOS 93 97 88 90 95  BILITOT 0.8 0.6  --  0.8  --   PROT 7.8 7.3  --  7.7  --   ALBUMIN 4.3 4.3  --  4.3  --     Recent Labs  07/18/16 1000 08/15/16 0500  AMMONIA 34 21   CBC:  Recent Labs  07/29/16 0231 07/30/16 0249 08/15/16 0024 08/20/16 08/26/16  WBC 10.8* 10.4 15.1* 12.3 10.8  NEUTROABS  --   --  8.6* 7 6  HGB 15.7* 13.6 16.2* 15.1 14.1  HCT 47.8* 41.3 48.1* 45 43  MCV 86.9 87.5 89.4  --   --   PLT 197 247 250 226 211   Lipid Panel:  Recent Labs   07/25/16 0226  HDL 65   CBG:  Recent Labs  08/01/16 0907 08/01/16 1225 08/15/16 0026  GLUCAP 88 91 102*      Dg Chest 1 View  Result Date: 08/15/2016 CLINICAL DATA:  Altered mental status EXAM: CHEST 1 VIEW COMPARISON:  06/16/2012 FINDINGS: AP portable views of the chest demonstrate no focal airspace consolidation or alveolar edema. The lungs are grossly clear. There is no large effusion or pneumothorax. Cardiac and mediastinal contours appear unremarkable. IMPRESSION: No active disease. Electronically Signed   By: Ellery Plunkaniel R Mitchell M.D.   On: 08/15/2016 02:45   Ct Head Wo Contrast  Result Date: 08/15/2016 CLINICAL DATA:  Altered mental status hypotensive and lethargic EXAM: CT HEAD WITHOUT CONTRAST TECHNIQUE: Contiguous axial images were obtained from the base of the skull through the vertex without intravenous contrast. COMPARISON:  07/28/2016, 07/26/2016 FINDINGS: Brain: A known left basal ganglia intraparenchymal hemorrhage has decreased in size, now measuring 3.1 x 1.5 cm, compared with 5.3 x 2.4 cm on prior CT. Hemorrhage is less dense. Residual 4.5 mm midline shift to the right. Decreasing edema surrounding the bleed. There is less mass effect on the left lateral ventricle. There is no new hemorrhage visualized. Scattered hypodensities in the white matter, likely small vessel disease Vascular: No hyperdense vessels.  No unexpected calcification Skull: No fracture or suspicious bone lesion Sinuses/Orbits: No acute finding. Other: None IMPRESSION: A known left  basal ganglial intraparenchymal hemorrhage is resolving ; it is decreased in size and less dense compared to prior. There is decreased surrounding mass effect. There is residual 4.5 mm midline shift to the right. There is no new hemorrhage identified. Electronically Signed   By: Jasmine Pang M.D.   On: 08/15/2016 03:08    ASSESSMENT/PLAN:  Generalized weakness - for home health PT and OT for therapeutic strengthening exercises;  fall precautions  Unsteady gait - will have home health PT and OT for strengthening exercises; fall precautions  Left intraparenchymal hemorrhage - has right-sided weakness and dysarthria, continue Lasix 20 mg 1 tab by mouth daily  Hypertension - well-controlled; Lisinopril was recently discontinued and continue Lasix 20 mg 1 tab PO Q D  Hypokalemia - continue KCL ER 10 meq PO BID ; check BMP in 1 week, follow-up with PCP Lab Results  Component Value Date   K 3.5 08/26/2016    Chronic pain - continue Norco 2.5-325 mg 1-2 tabs PO Q 8 hours PRN, Tylenol 325 mg 2 tabs = 650 mg PO Q 4 hours PRN for pain and baclofen 10 mg 1 tab by mouth every 12 hours when necessary for muscle spasm  UTI - no complaints of dysuria nor hematuria; continue Bactrim DS 1 tab by mouth twice a day till 09/03/16  Restless leg syndrome - continue ropinirole 2 mg 1 tab by mouth twice a day  Insomnia - continue melatonin 5 mg 1 tab by mouth daily at bedtime     I have filled out patient's discharge paperwork and written prescriptions.  Patient will receive home health PT, OT Skilled Nursing.  DME provided:  Hospital bed, wheelchair, slide board, bedside commode  Total discharge time: Greater/ than 30 minutes Greater than 50% was spent in counseling and coordination of care with the patient.  Discharge time involved coordination of the discharge process with social worker, nursing staff and therapy department. Medical justificat no weight is a will he is  pain is a ion for home health services/DME verified.    Tisha Cline C. Medina-Vargas - NP    [employer] [phone]

## 2016-09-13 ENCOUNTER — Encounter: Payer: Self-pay | Admitting: Diagnostic Neuroimaging

## 2016-09-13 ENCOUNTER — Ambulatory Visit (INDEPENDENT_AMBULATORY_CARE_PROVIDER_SITE_OTHER): Payer: Medicare HMO | Admitting: Diagnostic Neuroimaging

## 2016-09-13 VITALS — BP 123/75 | HR 83

## 2016-09-13 DIAGNOSIS — I610 Nontraumatic intracerebral hemorrhage in hemisphere, subcortical: Secondary | ICD-10-CM

## 2016-09-13 DIAGNOSIS — I10 Essential (primary) hypertension: Secondary | ICD-10-CM

## 2016-09-13 NOTE — Progress Notes (Signed)
[facility]  PATIENT: TALIA HOHEISEL DOB: Apr 01, 1956  REFERRING CLINICIAN: Pearlean Brownie HISTORY FROM: patient and husband  REASON FOR VISIT: new consult   HISTORICAL  CHIEF COMPLAINT:  Chief Complaint  Patient presents with  . Cerebrovascular Accident    rm 6, New Pt, Dr Pearlean Brownie, stroke FU, husband - Brett Canales    HISTORY OF PRESENT ILLNESS:   61 year old female with hypertension, here for evaluation of hospital stroke discharge follow-up.  Patient has history of arthritis, chronic pain, chronic narcotic use, hypertension, kidney stones, was found at home with right hemiparesis in early April 2018. Patient was found to have left basal ganglia intracerebral hemorrhage with significant hypertension of 181/104. Patient was admitted to neuro ICU for medical management. Patient was stabilized medically and discharged to skilled nursing facility.  Since that time patient has returned back to home. She's been home for the past one week. She is doing well. Her right upper extremity and speech difficulty has significantly improved. Right leg weakness still continues. She is working with home physical and occupational therapy. She is tolerating her medications.   REVIEW OF SYSTEMS: Full 14 system review of systems performed and negative with exception of: Numbness weakness restless legs not asleep.  ALLERGIES: Allergies  Allergen Reactions  . Garamycin [Gentamicin Sulfate] Hives and Shortness Of Breath  . Penicillins Anaphylaxis, Itching and Swelling    Has patient had a PCN reaction causing immediate rash, facial/tongue/throat swelling, SOB or lightheadedness with hypotension: Yes Has patient had a PCN reaction causing severe rash involving mucus membranes or skin necrosis: No Has patient had a PCN reaction that required hospitalization No Has patient had a PCN reaction occurring within the last 10 years: No If all of the above answers are "NO", then may proceed with Cephalosporin  use.   Marland Kitchen Norvasc [Amlodipine Besylate] Nausea And Vomiting    HOME MEDICATIONS: Outpatient Medications Prior to Visit  Medication Sig Dispense Refill  . acetaminophen (TYLENOL) 325 MG tablet Take 2 tablets (650 mg total) by mouth every 4 (four) hours as needed for mild pain (or temp > 37.5 C (99.5 F)). Combination of acetaminophen in vicodin and tylenol should not exceed max dose of 4000mg  daily    . baclofen (LIORESAL) 10 MG tablet Take 10 mg by mouth every 12 (twelve) hours as needed.     . cyanocobalamin (,VITAMIN B-12,) 1000 MCG/ML injection Inject 1,000 mcg into the muscle every 30 (thirty) days.     . fluticasone (FLONASE) 50 MCG/ACT nasal spray Place 2 sprays into both nostrils daily.     . furosemide (LASIX) 20 MG tablet Take 20 mg by mouth daily.    . Hydrocodone-Acetaminophen 2.5-325 MG TABS Take 1 tablet by mouth every 8 (eight) hours as needed.    . loratadine (CLARITIN) 10 MG tablet Take 10 mg by mouth daily.    . Melatonin 5 MG TABS Take 5 mg by mouth at bedtime.    . Menthol, Topical Analgesic, (BIOFREEZE) 4 % GEL Apply 1 application topically every 8 (eight) hours as needed (pain). Apply to back    . Nutritional Supplements (NUTRITIONAL SUPPLEMENT PO) Take 1 each by mouth daily. Magic Cup    . potassium chloride (K-DUR,KLOR-CON) 10 MEQ tablet Take 10 mEq by mouth 2 (two) times daily.    Marland Kitchen rOPINIRole (REQUIP) 2 MG tablet Take 2 mg by mouth 2 (two) times daily.     Marland Kitchen senna-docusate (SENOKOT-S) 8.6-50 MG tablet Take 1 tablet by mouth 2 (two) times daily. (  Patient not taking: Reported on 09/13/2016)    . NUTRITIONAL SUPPLEMENT LIQD Take 120 mLs by mouth 2 (two) times daily. MedPass     No facility-administered medications prior to visit.     PAST MEDICAL HISTORY: Past Medical History:  Diagnosis Date  . Arthritis    in back  . Back pain   . Chest pain 06/16/2012   negative cath with normal LV function  . Headache(784.0)   . Hypertension   . Kidney stones     PAST  SURGICAL HISTORY: Past Surgical History:  Procedure Laterality Date  . ABDOMINAL HYSTERECTOMY    . BACK SURGERY     x 3  . LEFT HEART CATHETERIZATION WITH CORONARY ANGIOGRAM N/A 06/16/2012   Procedure: LEFT HEART CATHETERIZATION WITH CORONARY ANGIOGRAM;  Surgeon: Peter M Swaziland, MD;  [facility] C[facility] LAB;  Service: Cardiovascular;  Laterality: N/A;  . LITHOTRIPSY    . TUBAL LIGATION      FAMILY HISTORY: Family History  Problem Relation Age of Onset  . Emphysema Mother   . Diabetes Mother     SOCIAL HISTORY:  Social History   Social History  . Marital status: Married    Spouse name: Brett Canales  . Number of children: 2  . Years of education: 14   Occupational History  .      retired Public house manager   Social History Main Topics  . Smoking status: Former Smoker    Packs/day: 0.50    Years: 26.00    Types: Cigarettes    Quit date: 06/17/2012  . Smokeless tobacco: Never Used  . Alcohol use No  . Drug use: No  . Sexual activity: Not Currently   Other Topics Concern  . Not on file   Social History Narrative   Lives at home with husband     PHYSICAL EXAM  GENERAL EXAM/CONSTITUTIONAL: Vitals:  Vitals:   09/13/16 0849  BP: 123/75  Pulse: 83     There is no height or weight on file to calculate BMI.  No exam data present  Patient is in no distress; well developed, nourished and groomed; neck is supple  CARDIOVASCULAR:  Examination of carotid arteries is normal; no carotid bruits  Regular rate and rhythm, no murmurs  Examination of peripheral vascular system by observation and palpation is normal  EYES:  Ophthalmoscopic exam of optic discs and posterior segments is normal; no papilledema or hemorrhages  MUSCULOSKELETAL:  Gait, strength, tone, movements noted in Neurologic exam below  NEUROLOGIC: MENTAL STATUS:  No flowsheet data found.  awake, alert, oriented to person, place and time  recent and remote memory intact  normal attention and  concentration  language fluent, comprehension intact, naming intact,   fund of knowledge appropriate  CRANIAL NERVE:   2nd - no papilledema on fundoscopic exam  2nd, 3rd, 4th, 6th - pupils equal and reactive to light, visual fields full to confrontation, extraocular muscles intact, no nystagmus  5th - facial sensation --> DECR ON RIGHT FACE  7th - facial strength --> DECR RIGHT LOWER FACIAL STRENGTH  8th - hearing intact  9th - palate elevates symmetrically, uvula midline  11th - shoulder shrug symmetric  12th - tongue protrusion midline  MILD DYSARTHRIA  MOTOR:   normal bulk and tone, full strength in the LUE, LLE  RUE 3  RLE 3  SENSORY:   normal and symmetric to light touch, temperature, vibration  DECR IN RIGHT ARM AND RIGHT LEG  COORDINATION:   finger-nose-finger, fine finger movements  SLOW ON RIGHT  REFLEXES:   deep tendon reflexes present and SLIGHTLY BRISK IN RUE  GAIT/STATION:   IN WHEEL CHAIR; ABLE TO STAND WITH SOME ASSISTANCE    DIAGNOSTIC DATA (LABS, IMAGING, TESTING) - I reviewed patient records, labs, notes, testing and imaging myself where available.  Lab Results  Component Value Date   WBC 10.8 08/26/2016   HGB 14.1 08/26/2016   HCT 43 08/26/2016   MCV 89.4 08/15/2016   PLT 211 08/26/2016      Component Value Date/Time   NA 140 08/26/2016   K 3.5 08/26/2016   CL 107 08/15/2016 0024   CO2 21 (L) 08/15/2016 0024   GLUCOSE 113 (H) 08/15/2016 0024   BUN 16 08/26/2016   CREATININE 0.5 08/26/2016   CREATININE 0.92 08/15/2016 0024   CALCIUM 9.6 08/15/2016 0024   PROT 7.7 08/15/2016 0024   ALBUMIN 4.3 08/15/2016 0024   AST 19 08/20/2016   ALT 22 08/20/2016   ALKPHOS 95 08/20/2016   BILITOT 0.8 08/15/2016 0024   GFRNONAA >60 08/15/2016 0024   GFRAA >60 08/15/2016 0024   Lab Results  Component Value Date   CHOL 195 07/25/2016   HDL 65 07/25/2016   LDLCALC 109 (H) 07/25/2016   TRIG 107 07/25/2016   CHOLHDL 3.0 07/25/2016    Lab Results  Component Value Date   HGBA1C 5.2 07/25/2016   Lab Results  Component Value Date   VITAMINB12 1,181 (H) 07/25/2016   Lab Results  Component Value Date   TSH 1.527 07/25/2016    08/15/16 CT head [I reviewed images myself and agree with interpretation. -VRP]  - A known left basal ganglial intraparenchymal hemorrhage is resolving; it is decreased in size and less dense compared to prior. There is decreased surrounding mass effect. There is residual 4.5 mm midline shift to the right. There is no new hemorrhage identified.  07/28/16 MRI brain / MRA head [I reviewed images myself and agree with interpretation. -VRP]  1. Acute intra-axial hemorrhage in the left lentiform with estimated blood volume of 26 mL has not significantly changed since 07/24/2016. Surrounding edema and regional mass effect including partial effacement of the left lateral ventricle, but no intraventricular hemorrhage, extra-axial hemorrhage, or ventriculomegaly. 2. Negative intracranial MRA aside from intracranial artery tortuosity. 3. Moderate to severe for age nonspecific cerebral white matter signal changes. Top differential considerations include chronic small vessel disease and chronic demyelinating disease.   07/26/16 TTE - Left ventricle: The cavity size was normal. There was mild   concentric hypertrophy. Systolic function was normal. The   estimated ejection fraction was in the range of 55% to 60%. The   study is not technically sufficient to allow evaluation of LV   diastolic function. - Limited, poor technical quality study. It should be repeated when   the patient is able to cooperate.    ASSESSMENT AND PLAN  61 y.o. year old female here with left basal ganglia intracerebral hemorrhage due to hypertension in April 2018, now doing well. Symptoms gradually improving.  Dx: left basal ganglia ICH (new problem, no additional workup)  1. 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    Suanne MarkerVIKRAM R. PENUMALLI, MD 09/13/2016, 9:03 AM Certified  in Neurology, Neurophysiology and Neuroimaging  [facility] [address], [place], [place] [address] [phone]

## 2016-09-13 NOTE — Patient Instructions (Signed)
Thank you for coming to see [place] at [facility]. I hope we have been able to provide you high quality care today.  You may receive a patient satisfaction survey over the next few weeks. We would appreciate your feedback and comments so that we may continue to improve ourselves and the health of our patients.  -    ~~~~~~~~~~~~~~~~~~~~~~~~~~~~~~~~~~~~~~~~~~~~~~~~~~~~~~~~~~~~~~~~~  DR. Shawndale Kilpatrick'S GUIDE TO HAPPY AND HEALTHY LIVING These are some of my general health and wellness recommendations. Some of them may apply to you better than others. Please use common sense as you try these suggestions and feel free to ask me any questions.   ACTIVITY/FITNESS Mental, social, emotional and physical stimulation are very important for brain and body health. Try learning a new activity (arts, music, language, sports, games).  Keep moving your body to the best of your abilities. You can do this at home, inside or outside, the park, community center, gym or anywhere you like. Consider a physical therapist or personal trainer to get started. Consider the app Sworkit. Fitness trackers such as smart-watches, smart-phones or Fitbits can help as well.   NUTRITION Eat more plants: colorful vegetables, nuts, seeds and berries.  Eat less sugar, salt, preservatives and processed foods.  Avoid toxins such as cigarettes and alcohol.  Drink water when you are thirsty. Warm water with a slice of lemon is an excellent morning drink to start the day.  Consider these websites for more information The Nutrition Source (https://www.henry-hernandez.biz/) [employer] Nutrition (WindowBlog.ch)   RELAXATION Consider practicing mindfulness meditation or other relaxation techniques such as deep breathing, prayer, yoga, tai chi, massage. See website mindful.org or the apps Headspace or Calm to help get started.   SLEEP Try to get at least 7-8+ hours sleep per  day. Regular exercise and reduced caffeine will help you sleep better. Practice good sleep hygeine techniques. See website sleep.org for more information.   PLANNING Prepare estate planning, living will, healthcare POA documents. Sometimes this is best planned with the help of an attorney. Theconversationproject.org and agingwithdignity.org are excellent resources.

## 2016-10-11 ENCOUNTER — Other Ambulatory Visit: Payer: Self-pay | Admitting: Adult Health

## 2016-10-15 ENCOUNTER — Encounter (HOSPITAL_BASED_OUTPATIENT_CLINIC_OR_DEPARTMENT_OTHER): Payer: Self-pay | Admitting: Emergency Medicine

## 2016-10-15 ENCOUNTER — Emergency Department (HOSPITAL_BASED_OUTPATIENT_CLINIC_OR_DEPARTMENT_OTHER)
Admission: EM | Admit: 2016-10-15 | Discharge: 2016-10-15 | Disposition: A | Discharge status: Home / Self Care | Payer: Medicare HMO | Attending: Emergency Medicine | Admitting: Emergency Medicine

## 2016-10-15 ENCOUNTER — Emergency Department (HOSPITAL_BASED_OUTPATIENT_CLINIC_OR_DEPARTMENT_OTHER): Payer: Medicare HMO

## 2016-10-15 DIAGNOSIS — I10 Essential (primary) hypertension: Secondary | ICD-10-CM | POA: Insufficient documentation

## 2016-10-15 DIAGNOSIS — Z79899 Other long term (current) drug therapy: Secondary | ICD-10-CM | POA: Insufficient documentation

## 2016-10-15 DIAGNOSIS — M7551 Bursitis of right shoulder: Secondary | ICD-10-CM | POA: Diagnosis not present

## 2016-10-15 DIAGNOSIS — M25511 Pain in right shoulder: Secondary | ICD-10-CM | POA: Diagnosis present

## 2016-10-15 DIAGNOSIS — Z87891 Personal history of nicotine dependence: Secondary | ICD-10-CM | POA: Insufficient documentation

## 2016-10-15 MED ORDER — NAPROXEN 375 MG PO TABS: 375.0000 mg | ORAL_TABLET | Freq: Two times a day (BID) | ORAL | 0 refills | Status: DC

## 2016-10-15 MED ORDER — KETOROLAC TROMETHAMINE 30 MG/ML IJ SOLN
INTRAMUSCULAR | Status: AC
  Filled 2016-10-15: qty 1

## 2016-10-15 MED ORDER — KETOROLAC TROMETHAMINE 60 MG/2ML IM SOLN
30.0000 mg | Freq: Once | INTRAMUSCULAR | Status: AC
  Administered 2016-10-15: 30 mg via INTRAMUSCULAR

## 2016-10-15 NOTE — ED Provider Notes (Signed)
[facility] Provider Note   CSN: [id number] Arrival date & time: 10/15/16  1191     History   Chief Complaint Chief Complaint  Patient presents with  . Shoulder Injury    HPI Heidi Green is a 61 y.o. female.  The history is provided by the patient.  Shoulder Pain   This is a new problem. The current episode started more than 2 days ago. The problem occurs constantly. The problem has not changed since onset.The pain is present in the right shoulder. The quality of the pain is described as aching. The pain is moderate. Pertinent negatives include no numbness. Associated symptoms comments: Swelling over the bursa. She has tried nothing for the symptoms. The treatment provided no relief.    Past Medical History:  Diagnosis Date  . Arthritis    in back  . Back pain   . Chest pain 06/16/2012   negative cath with normal LV function  . Headache(784.0)   . Hypertension   . Kidney stones     Patient Active Problem List   Diagnosis Date Noted  . Hypertensive emergency 08/01/2016  . Dysphagia following nontraumatic intracerebral hemorrhage 08/01/2016  . Hypokalemia 08/01/2016  . Agitation 08/01/2016  . Non-compliance 08/01/2016  . Allergic rhinitis 08/01/2016  . 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 Class 2 obesity   . Acute encephalopathy 07/18/2016  . NSTEMI (non-ST elevated myocardial infarction) (HCC) 06/17/2012  . Chest pain 06/16/2012  . HTN (hypertension) 06/16/2012  . Dyslipidemia 06/16/2012  . Back pain 06/16/2012  . Tobacco abuse 06/16/2012    Past Surgical History:  Procedure Laterality Date  . ABDOMINAL HYSTERECTOMY    . BACK SURGERY     x 3  . LEFT HEART CATHETERIZATION WITH CORONARY ANGIOGRAM N/A 06/16/2012   Procedure: LEFT HEART CATHETERIZATION WITH CORONARY ANGIOGRAM;  Surgeon: Peter M Swaziland, MD;  Location: [facility] CATH LAB;  Service: Cardiovascular;  Laterality: N/A;  . LITHOTRIPSY    . TUBAL LIGATION       OB History    No data available       Home Medications    Prior to Admission medications   Medication Sig Start Date End Date Taking? Authorizing Provider  acetaminophen (TYLENOL) 325 MG tablet Take 2 tablets (650 mg total) by mouth every 4 (four) hours as needed for mild pain (or temp > 37.5 C (99.5 F)). Combination of acetaminophen in vicodin and tylenol should not exceed max dose of 4000mg  daily 08/01/16   Layne Benton, NP  baclofen (LIORESAL) 10 MG tablet Take 10 mg by mouth every 12 (twelve) hours as needed.  06/21/16   [provider]  cyanocobalamin (,VITAMIN B-12,) 1000 MCG/ML injection Inject 1,000 mcg into the muscle every 30 (thirty) days.  07/11/16   [provider]  fluticasone (FLONASE) 50 MCG/ACT nasal spray Place 2 sprays into both nostrils daily.     [provider]  furosemide (LASIX) 20 MG tablet Take 20 mg by mouth daily.    [provider]  Hydrocodone-Acetaminophen 2.5-325 MG TABS Take 1 tablet by mouth every 8 (eight) hours as needed.    [provider]  lisinopril (PRINIVIL,ZESTRIL) 2.5 MG tablet Take 2.5 mg by mouth daily. 09/13/16 dose unknown     [provider]  loratadine (CLARITIN) 10 MG tablet Take 10 mg by mouth daily.    [provider]  Melatonin 5 MG TABS Take 5 mg by mouth at  bedtime.    [provider]  Menthol, Topical Analgesic, (BIOFREEZE) 4 % GEL Apply 1 application topically every 8 (eight) hours as needed (pain). Apply to back    [provider]  naproxen (NAPROSYN) 375 MG tablet Take 1 tablet (375 mg total) by mouth 2 (two) times daily. With food 10/15/16   Simora Dingee, MD  Nutritional Supplements (NUTRITIONAL SUPPLEMENT PO) Take 1 each by mouth daily. Magic Cup    [provider]  potassium chloride (K-DUR,KLOR-CON) 10 MEQ tablet Take 10 mEq by mouth 2 (two) times daily.    [provider]  rOPINIRole (REQUIP) 2 MG tablet Take 2 mg by mouth 2 (two)  times daily.     [provider]  senna-docusate (SENOKOT-S) 8.6-50 MG tablet Take 1 tablet by mouth 2 (two) times daily. Patient not taking: Reported on 09/13/2016 08/01/16   Layne Benton, NP    Family History Family History  Problem Relation Age of Onset  . Emphysema Mother   . Diabetes Mother     Social History Social History  Substance Use Topics  . Smoking status: Former Smoker    Packs/day: 0.50    Years: 26.00    Types: Cigarettes    Quit date: 06/17/2012  . Smokeless tobacco: Never Used  . Alcohol use No     Allergies   Garamycin [gentamicin sulfate]; Penicillins; and Norvasc [amlodipine besylate]   Review of Systems Review of Systems  Constitutional: Negative for fever.  Musculoskeletal: Positive for arthralgias and joint swelling.  Skin: Negative for color change, pallor, rash and wound.  Neurological: Negative for weakness and numbness.  All other systems reviewed and are negative.    Physical Exam Updated Vital Signs BP 134/72 (BP Location: Left Arm)   Pulse 63   Temp 98.5 F (36.9 C) (Oral)   Resp 18   Ht 5\' 8"  (1.727 m)   Wt 86.2 kg (190 lb)   SpO2 96%   BMI 28.89 kg/m   Physical Exam  Constitutional: She appears well-developed and well-nourished. No distress.  HENT:  Head: Normocephalic and atraumatic.  Nose: Nose normal.  Mouth/Throat: No oropharyngeal exudate.  Eyes: Conjunctivae and EOM are normal. Pupils are equal, round, and reactive to light.  Neck: Normal range of motion. Neck supple.  Cardiovascular: Normal rate, regular rhythm, normal heart sounds and intact distal pulses.   Pulmonary/Chest: Effort normal and breath sounds normal. She has no wheezes. She has no rales.  Abdominal: Soft. Bowel sounds are normal. She exhibits no mass. There is no tenderness. There is no rebound and no guarding.  Musculoskeletal: Normal range of motion. She exhibits no edema.  Swelling without warmth or erythema over the bursa right shoulder    Neurological: She is alert.  Skin: Skin is warm and dry. Capillary refill takes less than 2 seconds.  Psychiatric: She has a normal mood and affect.     ED Treatments / Results   Radiology Dg Shoulder Right  Result Date: 10/15/2016 CLINICAL DATA:  Right shoulder pain and swelling. Sudden onset this morning. Right hand numbness. Possible fall. EXAM: RIGHT SHOULDER - 2+ VIEW COMPARISON:  None. FINDINGS: There is no evidence of fracture or dislocation. There is no evidence of arthropathy or other focal bone abnormality. Soft tissues are unremarkable. IMPRESSION: No fracture or subluxation of the right shoulder. No explanation for right shoulder pain. Electronically Signed   By: Rubye Oaks M.D.   On: 10/15/2016 19:54    Procedures Procedures (including critical care  time)  Medications Ordered in ED Medications  ketorolac (TORADOL) injection 30 mg (not administered)       Final Clinical Impressions(s) / ED Diagnoses   Final diagnoses:  Bursitis of shoulder, right   Follow up with your PMD for ongoing care will refer to sports medicine. Gentle ROM .  I doubt symptoms are cardiac and patient has has had multiple normal EKGs and troponins in the past 24 hours.  Strict return precautions given.  Return immediately for fever >101, altered level of consciousness,bleeding or any concerns. Follow up with your own doctor for ongoing concerns.   The patient is nontoxic-appearing on exam and vital signs are within normal limits.   I have reviewed the triage vital signs and the nursing notes. Pertinent labs &imaging results that were available during my care of the patient were reviewed by me and considered in my medical decision making (see chart for details).  After history, exam, and medical workup I feel the patient has been appropriately medically screened and is safe for discharge home. Pertinent diagnoses were discussed with the patient. Patient was given return  precautions.  New Prescriptions New Prescriptions   NAPROXEN (NAPROSYN) 375 MG TABLET    Take 1 tablet (375 mg total) by mouth 2 (two) times daily. With food     Janiesha Diehl, MD 10/16/16 [id number]

## 2016-10-15 NOTE — ED Triage Notes (Addendum)
Patient reports that she had a stroke in aprl. The pateint noticed that she had some swelling to her right shoulder this am. The patient reports that the pain and numbness was getting better. But now it is worse tonight. Patient denies any fall or injury

## 2016-10-15 NOTE — ED Notes (Signed)
C/o rt shoulder swelling and more numbness o rt hand than usual,  Denies pain  No inj  Rt arm strength less than left, but had cva to that side 2 months ago  Is able to more rt shoulder

## 2016-10-17 ENCOUNTER — Encounter: Payer: Self-pay | Admitting: Family Medicine

## 2016-10-17 ENCOUNTER — Ambulatory Visit (INDEPENDENT_AMBULATORY_CARE_PROVIDER_SITE_OTHER): Payer: Medicare HMO | Admitting: Family Medicine

## 2016-10-17 VITALS — BP 124/82 | HR 65 | Ht 69.0 in | Wt 190.0 lb

## 2016-10-17 DIAGNOSIS — M25511 Pain in right shoulder: Secondary | ICD-10-CM

## 2016-10-17 DIAGNOSIS — R200 Anesthesia of skin: Secondary | ICD-10-CM | POA: Diagnosis not present

## 2016-10-17 NOTE — Patient Instructions (Signed)
The swollen area of your shoulder is a lipoma based on the ultrasound and unrelated to your hand numbness. Bursitis, tendinitis, or frozen shoulder would not explain the numbness you're reporting in your right hand - this would be a neurologic issue - we will contact them and hopefully get you an appointment with the neurology group - there may not be anything additional they can do with your recent hemorrhagic stroke.   Continue your current blood pressure medicines in the meantime. Continue with the home physical therapy and occupational therapy (this would be used to treat frozen shoulder as well).

## 2016-10-18 ENCOUNTER — Ambulatory Visit: Payer: Medicare HMO | Admitting: Family Medicine

## 2016-10-21 ENCOUNTER — Encounter: Payer: Self-pay | Admitting: Diagnostic Neuroimaging

## 2016-10-21 ENCOUNTER — Ambulatory Visit (INDEPENDENT_AMBULATORY_CARE_PROVIDER_SITE_OTHER): Payer: Medicare HMO | Admitting: Diagnostic Neuroimaging

## 2016-10-21 VITALS — BP 136/82 | HR 93 | Ht 69.0 in

## 2016-10-21 DIAGNOSIS — R200 Anesthesia of skin: Secondary | ICD-10-CM

## 2016-10-21 DIAGNOSIS — I610 Nontraumatic intracerebral hemorrhage in hemisphere, subcortical: Secondary | ICD-10-CM | POA: Diagnosis not present

## 2016-10-21 DIAGNOSIS — R202 Paresthesia of skin: Secondary | ICD-10-CM

## 2016-10-21 DIAGNOSIS — I10 Essential (primary) hypertension: Secondary | ICD-10-CM | POA: Diagnosis not present

## 2016-10-21 NOTE — Progress Notes (Signed)
[facility]  PATIENT: Heidi Green DOB: 27-Oct-1955  REFERRING CLINICIAN: Pearlean Brownie HISTORY FROM: patient and husband  REASON FOR VISIT: new consult / existing patient   HISTORICAL  CHIEF COMPLAINT:  Chief Complaint  Patient presents with  . Follow-up  . Cerebrovascular Accident    R hand numb,  Had been to othro for lipoma? on R shoulder, Now R hand numbness,  still with HHPT    HISTORY OF PRESENT ILLNESS:   UPDATE 10/21/16: Since last visit, was doing well until 10/15/16, when she woke up with new and different right hand numbness and weakness. Also noted swelling in right shoulder. Therefore went to the ER. Dx'd with fluid in bursa and sent to sports medicine clinic. There, she was dx'd with right shoulder lipoma, and referred back here to find out etiology of right hand numbness.   PRIOR HPI (10/15/16): 61 year old female with hypertension, here for evaluation of hospital stroke discharge follow-up. Patient has history of arthritis, chronic pain, chronic narcotic use, hypertension, kidney stones, was found at home with right hemiparesis in early April 2018. Patient was found to have left basal ganglia intracerebral hemorrhage with significant hypertension of 181/104. Patient was admitted to neuro ICU for medical management. Patient was stabilized medically and discharged to skilled nursing facility. Since that time patient has returned back to home. She's been home for the past one week. She is doing well. Her right upper extremity and speech difficulty has significantly improved. Right leg weakness still continues. She is working with home physical and occupational therapy. She is tolerating her medications.   REVIEW OF SYSTEMS: Full 14 system review of systems performed and negative with exception of: numbness.   ALLERGIES: Allergies  Allergen Reactions  . Garamycin [Gentamicin Sulfate] Hives and Shortness Of Breath  . Penicillins Anaphylaxis, Itching and Swelling   Has patient had a PCN reaction causing immediate rash, facial/tongue/throat swelling, SOB or lightheadedness with hypotension: Yes Has patient had a PCN reaction causing severe rash involving mucus membranes or skin necrosis: No Has patient had a PCN reaction that required hospitalization No Has patient had a PCN reaction occurring within the last 10 years: No If all of the above answers are "NO", then may proceed with Cephalosporin use.   Marland Kitchen Norvasc [Amlodipine Besylate] Nausea And Vomiting    HOME MEDICATIONS: Outpatient Medications Prior to Visit  Medication Sig Dispense Refill  . acetaminophen (TYLENOL) 325 MG tablet Take 2 tablets (650 mg total) by mouth every 4 (four) hours as needed for mild pain (or temp > 37.5 C (99.5 F)). Combination of acetaminophen in vicodin and tylenol should not exceed max dose of 4000mg  daily    . baclofen (LIORESAL) 10 MG tablet Take 10 mg by mouth every 12 (twelve) hours as needed.     . cyanocobalamin (,VITAMIN B-12,) 1000 MCG/ML injection Inject 1,000 mcg into the muscle every 30 (thirty) days.     . fluticasone (FLONASE) 50 MCG/ACT nasal spray Place 2 sprays into both nostrils daily.     . furosemide (LASIX) 20 MG tablet Take 20 mg by mouth daily.    . Hydrocodone-Acetaminophen 2.5-325 MG TABS Take 1 tablet by mouth every 8 (eight) hours as needed.    Marland Kitchen lisinopril (PRINIVIL,ZESTRIL) 2.5 MG tablet Take 2.5 mg by mouth daily. 09/13/16 dose unknown     . loratadine (CLARITIN) 10 MG tablet Take 10 mg by mouth daily.    . Melatonin 5 MG TABS Take 5 mg by mouth at bedtime.    Marland Kitchen  Menthol, Topical Analgesic, (BIOFREEZE) 4 % GEL Apply 1 application topically every 8 (eight) hours as needed (pain). Apply to back    . naproxen (NAPROSYN) 375 MG tablet Take 1 tablet (375 mg total) by mouth 2 (two) times daily. With food 20 tablet 0  . Nutritional Supplements (NUTRITIONAL SUPPLEMENT PO) Take 1 each by mouth daily. Magic Cup    . potassium chloride (K-DUR,KLOR-CON) 10 MEQ  tablet Take 10 mEq by mouth 2 (two) times daily.    Marland Kitchen. rOPINIRole (REQUIP) 2 MG tablet Take 2 mg by mouth 2 (two) times daily.     Marland Kitchen. senna-docusate (SENOKOT-S) 8.6-50 MG tablet Take 1 tablet by mouth 2 (two) times daily.     No facility-administered medications prior to visit.     PAST MEDICAL HISTORY: Past Medical History:  Diagnosis Date  . Arthritis    in back  . Back pain   . Chest pain 06/16/2012   negative cath with normal LV function  . Headache(784.0)   . Hypertension   . Kidney stones     PAST SURGICAL HISTORY: Past Surgical History:  Procedure Laterality Date  . ABDOMINAL HYSTERECTOMY    . BACK SURGERY     x 3  . LEFT HEART CATHETERIZATION WITH CORONARY ANGIOGRAM N/A 06/16/2012   Procedure: LEFT HEART CATHETERIZATION WITH CORONARY ANGIOGRAM;  Surgeon: Peter M SwazilandJordan, MD;  Location: [facility] CATH LAB;  Service: Cardiovascular;  Laterality: N/A;  . LITHOTRIPSY    . TUBAL LIGATION      FAMILY HISTORY: Family History  Problem Relation Age of Onset  . Emphysema Mother   . Diabetes Mother     SOCIAL HISTORY:  Social History   Social History  . Marital status: Married    Spouse name: Brett CanalesSteve  . Number of children: 2  . Years of education: 14   Occupational History  .      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 Smoking status: Former Smoker    Packs/day: 0.50    Years: 26.00    Types: Cigarettes    Quit date: 06/17/2012  . Smokeless tobacco: Never Used  . Alcohol use No  . Drug use: No  . Sexual activity: Not Currently   Other Topics Concern  . Not on file   Social History Narrative   Lives at home with husband     PHYSICAL EXAM  GENERAL EXAM/CONSTITUTIONAL: Vitals:  Vitals:   10/21/16 1317  BP: 136/82  Pulse: 93  Height: 5\' 9"  (1.753 m)   There is no height or weight on file to calculate BMI. No exam data present  Patient is in no distress; well developed, nourished and groomed; neck is supple  CARDIOVASCULAR:  Examination of carotid  arteries is normal; no carotid bruits  Regular rate and rhythm, no murmurs  Examination of peripheral vascular system by observation and palpation is normal  EYES:  Ophthalmoscopic exam of optic discs and posterior segments is normal; no papilledema or hemorrhages  MUSCULOSKELETAL:  Gait, strength, tone, movements noted in Neurologic exam below  NEUROLOGIC: MENTAL STATUS:  No flowsheet data found.  awake, alert, oriented to person, place and time  recent and remote memory intact  normal attention and concentration  language fluent, comprehension intact, naming intact,   fund of knowledge appropriate  CRANIAL NERVE:   2nd - no papilledema on fundoscopic exam  2nd, 3rd, 4th, 6th - pupils equal and reactive to light, visual fields full to confrontation, extraocular muscles intact, no  nystagmus  5th - facial sensation --> DECR ON RIGHT FACE  7th - facial strength --> DECR RIGHT LOWER FACIAL STRENGTH  8th - hearing intact  9th - palate elevates symmetrically, uvula midline  11th - shoulder shrug symmetric  12th - tongue protrusion midline  MILD DYSARTHRIA  MOTOR:   normal bulk and tone, full strength in the LUE, LLE  RUE 3 PROX; 3-4 DISTAL  RLE 3 PROX; 2 DISTAL  SENSORY:   normal and symmetric to light touch, temperature, vibration  DECR IN RIGHT ARM AND RIGHT LEG TO ALL MODALITIES  COORDINATION:   finger-nose-finger, fine finger movements SLOW ON RIGHT  REFLEXES:   deep tendon reflexes present and SLIGHTLY BRISK IN RUE  GAIT/STATION:   IN WHEEL CHAIR    DIAGNOSTIC DATA (LABS, IMAGING, TESTING) - I reviewed patient records, labs, notes, testing and imaging myself where available.  Lab Results  Component Value Date   WBC 10.8 08/26/2016   HGB 14.1 08/26/2016   HCT 43 08/26/2016   MCV 89.4 08/15/2016   PLT 211 08/26/2016      Component Value Date/Time   NA 140 08/26/2016   K 3.5 08/26/2016   CL 107 08/15/2016 0024   CO2 21 (L)  08/15/2016 0024   GLUCOSE 113 (H) 08/15/2016 0024   BUN 16 08/26/2016   CREATININE 0.5 08/26/2016   CREATININE 0.92 08/15/2016 0024   CALCIUM 9.6 08/15/2016 0024   PROT 7.7 08/15/2016 0024   ALBUMIN 4.3 08/15/2016 0024   AST 19 08/20/2016   ALT 22 08/20/2016   ALKPHOS 95 08/20/2016   BILITOT 0.8 08/15/2016 0024   GFRNONAA >60 08/15/2016 0024   GFRAA >60 08/15/2016 0024   Lab Results  Component Value Date   CHOL 195 07/25/2016   HDL 65 07/25/2016   LDLCALC 109 (H) 07/25/2016   TRIG 107 07/25/2016   CHOLHDL 3.0 07/25/2016   Lab Results  Component Value Date   HGBA1C 5.2 07/25/2016   Lab Results  Component Value Date   VITAMINB12 1,181 (H) 07/25/2016   Lab Results  Component Value Date   TSH 1.527 07/25/2016    08/15/16 CT head [I reviewed images myself and agree with interpretation. -VRP]  - A known left basal ganglial intraparenchymal hemorrhage is resolving; it is decreased in size and less dense compared to prior. There is decreased surrounding mass effect. There is residual 4.5 mm midline shift to the right. There is no new hemorrhage identified.  07/28/16 MRI brain / MRA head [I reviewed images myself and agree with interpretation. -VRP]  1. Acute intra-axial hemorrhage in the left lentiform with estimated blood volume of 26 mL has not significantly changed since 07/24/2016. Surrounding edema and regional mass effect including partial effacement of the left lateral ventricle, but no intraventricular hemorrhage, extra-axial hemorrhage, or ventriculomegaly. 2. Negative intracranial MRA aside from intracranial artery tortuosity. 3. Moderate to severe for age nonspecific cerebral white matter signal changes. Top differential considerations include chronic small vessel disease and chronic demyelinating disease.   07/26/16 TTE - Left ventricle: The cavity size was normal. There was mild   concentric hypertrophy. Systolic function was normal. The   estimated ejection fraction  was in the range of 55% to 60%. The   study is not technically sufficient to allow evaluation of LV   diastolic function. - Limited, poor technical quality study. It should be repeated when   the patient is able to cooperate.    ASSESSMENT AND PLAN  61 y.o. 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 Symptoms were gradually improving.   Now some recurrence and worsening of symptoms on 10/15/16. Could be related to fluctuating stroke symptoms in recovery, new stroke, or other peripheral nerve issue. Also with right shoulder lipoma vs other fluid collection.  Dx:   1. Numbness and tingling in right hand   2. 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 Essential hypertension      PLAN: - check MRI brain (new onset right hand numbness and weakness on 10/15/16; different than prior stroke symptoms; rule out new stroke / ICH) - continue lisinopril 2.5mg  daily for BP control - continue home health PT, OT, nursing, CNA  Orders Placed This Encounter  Procedures  . MR BRAIN WO CONTRAST   Return in about 3 months (around 01/21/2017).    Suanne Marker, MD 10/21/2016, 1:37 PM Certified in Neurology, Neurophysiology and Neuroimaging  [facility] [address], [address] [place] [employer] [place], [place] [address] [phone]

## 2016-10-22 ENCOUNTER — Other Ambulatory Visit: Payer: Self-pay | Admitting: Adult Health

## 2016-10-22 DIAGNOSIS — M25511 Pain in right shoulder: Secondary | ICD-10-CM | POA: Insufficient documentation

## 2016-10-22 NOTE — Assessment & Plan Note (Signed)
Concerning for new CVA or progression of prior CVA.  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  She does have some weakness of the right arm and decreased motion but likely attributed to this and not separate shoulder pathology - she gets in home PT and OT - continue with these.  Ultrasound of mass consistent with lipoma unrelated to her current symptoms - advised given superficial and lateral location this would not cause her symptoms.

## 2016-10-22 NOTE — Progress Notes (Signed)
PCP: Karle Plumber, MD  Subjective:   HPI: Patient is a 61 y.o. female here for right shoulder pain.  Patient denies known injury or trauma. She went to ED for new swollen area right lateral shoulder and numbness in all fingers. Reports she burnt middle finger and didn't even feel this - developed a blister. 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 She reports this is a new finding as well as of 7/2. No pain, only numbness at rest. Some soreness to 3/10 if using right arm. No skin changes aside from the blister. No changes in speech, vision, weakness.  Past Medical History:  Diagnosis Date  . Arthritis    in back  . Back pain   . Chest pain 06/16/2012   negative cath with normal LV function  . Headache(784.0)   . Hypertension   . Kidney stones     Current Outpatient Prescriptions on File Prior to Visit  Medication Sig Dispense Refill  . acetaminophen (TYLENOL) 325 MG tablet Take 2 tablets (650 mg total) by mouth every 4 (four) hours as needed for mild pain (or temp > 37.5 C (99.5 F)). Combination of acetaminophen in vicodin and tylenol should not exceed max dose of 4000mg  daily    . baclofen (LIORESAL) 10 MG tablet Take 10 mg by mouth every 12 (twelve) hours as needed.     . cyanocobalamin (,VITAMIN B-12,) 1000 MCG/ML injection Inject 1,000 mcg into the muscle every 30 (thirty) days.     . fluticasone (FLONASE) 50 MCG/ACT nasal spray Place 2 sprays into both nostrils daily.     . furosemide (LASIX) 20 MG tablet Take 20 mg by mouth daily.    . Hydrocodone-Acetaminophen 2.5-325 MG TABS Take 1 tablet by mouth every 8 (eight) hours as needed.    Marland Kitchen lisinopril (PRINIVIL,ZESTRIL) 2.5 MG tablet Take 2.5 mg by mouth daily. 09/13/16 dose unknown     . loratadine (CLARITIN) 10 MG tablet Take 10 mg by mouth daily.    . Melatonin 5 MG TABS Take 5 mg by mouth at bedtime.    . Menthol, Topical Analgesic, (BIOFREEZE) 4 % GEL Apply  1 application topically every 8 (eight) hours as needed (pain). Apply to back    . naproxen (NAPROSYN) 375 MG tablet Take 1 tablet (375 mg total) by mouth 2 (two) times daily. With food 20 tablet 0  . Nutritional Supplements (NUTRITIONAL SUPPLEMENT PO) Take 1 each by mouth daily. Magic Cup    . potassium chloride (K-DUR,KLOR-CON) 10 MEQ tablet Take 10 mEq by mouth 2 (two) times daily.    Marland Kitchen rOPINIRole (REQUIP) 2 MG tablet Take 2 mg by mouth 2 (two) times daily.     Marland Kitchen senna-docusate (SENOKOT-S) 8.6-50 MG tablet Take 1 tablet by mouth 2 (two) times daily.     No current facility-administered medications on file prior to visit.     Past Surgical History:  Procedure Laterality Date  . ABDOMINAL HYSTERECTOMY    . BACK SURGERY     x 3  . LEFT HEART CATHETERIZATION WITH CORONARY ANGIOGRAM N/A 06/16/2012   Procedure: LEFT HEART CATHETERIZATION WITH CORONARY ANGIOGRAM;  Surgeon: Peter M Swaziland, MD;  Location: [facility] CATH LAB;  Service: Cardiovascular;  Laterality: N/A;  . LITHOTRIPSY    . TUBAL LIGATION      Allergies  Allergen Reactions  . Garamycin [Gentamicin Sulfate] Hives and Shortness Of Breath  . Penicillins Anaphylaxis, Itching  and Swelling    Has patient had a PCN reaction causing immediate rash, facial/tongue/throat swelling, SOB or lightheadedness with hypotension: Yes Has patient had a PCN reaction causing severe rash involving mucus membranes or skin necrosis: No Has patient had a PCN reaction that required hospitalization No Has patient had a PCN reaction occurring within the last 10 years: No If all of the above answers are "NO", then may proceed with Cephalosporin use.   Marland Kitchen. Norvasc [Amlodipine Besylate] Nausea And Vomiting    Social History   Social History  . Marital status: Married    Spouse name: Brett CanalesSteve  . Number of children: 2  . Years of education: 14   Occupational History  .      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 Smoking status: Former Smoker     Packs/day: 0.50    Years: 26.00    Types: Cigarettes    Quit date: 06/17/2012  . Smokeless tobacco: Never Used  . Alcohol use No  . Drug use: No  . Sexual activity: Not Currently   Other Topics Concern  . Not on file   Social History Narrative   Lives at home with husband    Family History  Problem Relation Age of Onset  . Emphysema Mother   . Diabetes Mother     BP 124/82   Pulse 65   Ht 5\' 9"  (1.753 m)   Wt 190 lb (86.2 kg)   BMI 28.06 kg/m   Review of Systems: See HPI above.     Objective:  Physical Exam:  Gen: NAD, comfortable in exam room  Right shoulder: Large swollen area of right shoulder superficially.  No ecchymoses.  No other deformity. No TTP of this mass or elsewhere about shoulder Able to abduct, flex to 90 degrees - denies pain. Negative Hawkins, Neers. Negative Yergasons but with weakness. Strength 3/5 with empty can and resisted internal/external rotation. Negative apprehension. Sensation diminished entire right hand, notes some sensation however in right forearm.  MSK u/s right shoulder:  Mass with echogenicity consistent with lipoma.  No anechoic areas, neovascularity.    Assessment & Plan:  1. Right hand numbness - Concerning for new CVA or progression of prior CVA.  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  She does have some weakness of the right arm and decreased motion but likely attributed to this and not separate shoulder pathology - she gets in home PT and OT - continue with these.

## 2016-11-05 ENCOUNTER — Ambulatory Visit
Admission: RE | Admit: 2016-11-05 | Discharge: 2016-11-05 | Disposition: A | Discharge status: Home / Self Care | Payer: Medicare HMO | Source: Ambulatory Visit | Attending: Diagnostic Neuroimaging | Admitting: Diagnostic Neuroimaging

## 2016-11-05 DIAGNOSIS — R200 Anesthesia of skin: Secondary | ICD-10-CM

## 2016-11-05 DIAGNOSIS — I610 Nontraumatic intracerebral hemorrhage in hemisphere, subcortical: Secondary | ICD-10-CM | POA: Diagnosis not present

## 2016-11-05 DIAGNOSIS — R202 Paresthesia of skin: Principal | ICD-10-CM

## 2016-11-07 ENCOUNTER — Telehealth: Payer: Self-pay | Admitting: Diagnostic Neuroimaging

## 2016-11-07 NOTE — Telephone Encounter (Signed)
Pt called the office request MRI results. She is aware it can take 4 business days for results. FYI

## 2016-11-07 NOTE — Telephone Encounter (Signed)
Called and spoke with pt. Advised per Dr Marjory LiesPenumalli result note that MRI showed no new findings. 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 She verbalized understanding.

## 2017-01-11 ENCOUNTER — Emergency Department (HOSPITAL_BASED_OUTPATIENT_CLINIC_OR_DEPARTMENT_OTHER): Payer: Medicare HMO

## 2017-01-11 ENCOUNTER — Emergency Department (HOSPITAL_BASED_OUTPATIENT_CLINIC_OR_DEPARTMENT_OTHER)
Admission: EM | Admit: 2017-01-11 | Discharge: 2017-01-11 | Disposition: A | Discharge status: Home / Self Care | Payer: Medicare HMO | Attending: Emergency Medicine | Admitting: Emergency Medicine

## 2017-01-11 ENCOUNTER — Encounter (HOSPITAL_BASED_OUTPATIENT_CLINIC_OR_DEPARTMENT_OTHER): Payer: Self-pay | Admitting: Emergency Medicine

## 2017-01-11 DIAGNOSIS — R410 Disorientation, unspecified: Secondary | ICD-10-CM | POA: Diagnosis not present

## 2017-01-11 DIAGNOSIS — Z79899 Other long term (current) drug therapy: Secondary | ICD-10-CM | POA: Insufficient documentation

## 2017-01-11 DIAGNOSIS — R4182 Altered mental status, unspecified: Secondary | ICD-10-CM | POA: Diagnosis present

## 2017-01-11 DIAGNOSIS — I10 Essential (primary) hypertension: Secondary | ICD-10-CM | POA: Diagnosis not present

## 2017-01-11 DIAGNOSIS — Z87891 Personal history of nicotine dependence: Secondary | ICD-10-CM | POA: Diagnosis not present

## 2017-01-11 HISTORY — DX: Cerebral infarction, unspecified: I63.9

## 2017-01-11 LAB — DIFFERENTIAL
BASOS ABS: 0.1 10*3/uL (ref 0.0–0.1)
BASOS PCT: 1 %
Eosinophils Absolute: 0.1 10*3/uL (ref 0.0–0.7)
Eosinophils Relative: 1 %
LYMPHS PCT: 30 %
Lymphs Abs: 3.1 10*3/uL (ref 0.7–4.0)
Monocytes Absolute: 0.8 10*3/uL (ref 0.1–1.0)
Monocytes Relative: 8 %
NEUTROS ABS: 6.5 10*3/uL (ref 1.7–7.7)
NEUTROS PCT: 60 %

## 2017-01-11 LAB — CBC
HCT: 42.1 % (ref 36.0–46.0)
Hemoglobin: 13.7 g/dL (ref 12.0–15.0)
MCH: 29.1 pg (ref 26.0–34.0)
MCHC: 32.5 g/dL (ref 30.0–36.0)
MCV: 89.4 fL (ref 78.0–100.0)
PLATELETS: 236 10*3/uL (ref 150–400)
RBC: 4.71 MIL/uL (ref 3.87–5.11)
RDW: 14.1 % (ref 11.5–15.5)
WBC: 10.7 10*3/uL — AB (ref 4.0–10.5)

## 2017-01-11 LAB — COMPREHENSIVE METABOLIC PANEL
ALBUMIN: 4.1 g/dL (ref 3.5–5.0)
ALK PHOS: 91 U/L (ref 38–126)
ALT: 24 U/L (ref 14–54)
AST: 29 U/L (ref 15–41)
Anion gap: 9 (ref 5–15)
BUN: 19 mg/dL (ref 6–20)
CALCIUM: 9 mg/dL (ref 8.9–10.3)
CHLORIDE: 105 mmol/L (ref 101–111)
CO2: 25 mmol/L (ref 22–32)
CREATININE: 0.77 mg/dL (ref 0.44–1.00)
GFR calc Af Amer: >60 mL/min (ref >60)
GFR calc non Af Amer: >60 mL/min (ref >60)
GLUCOSE: 99 mg/dL (ref 65–99)
Potassium: 3.7 mmol/L (ref 3.5–5.1)
SODIUM: 139 mmol/L (ref 135–145)
Total Bilirubin: 0.3 mg/dL (ref 0.3–1.2)
Total Protein: 7.3 g/dL (ref 6.5–8.1)

## 2017-01-11 LAB — TROPONIN I: Troponin I: <0.03 ng/mL (ref <0.03)

## 2017-01-11 LAB — PROTIME-INR
INR: 0.98
PROTHROMBIN TIME: 12.9 s (ref 11.4–15.2)

## 2017-01-11 LAB — ETHANOL

## 2017-01-11 LAB — APTT: APTT: 34 s (ref 24–36)

## 2017-01-11 MED ORDER — NALOXONE HCL 0.4 MG/ML IJ SOLN
0.4000 mg | Freq: Once | INTRAMUSCULAR | Status: DC
  Filled 2017-01-11: qty 1

## 2017-01-11 MED ORDER — SODIUM CHLORIDE 0.9 % IV BOLUS (SEPSIS)
500.0000 mL | Freq: Once | INTRAVENOUS | Status: AC
  Administered 2017-01-11: 500 mL via INTRAVENOUS

## 2017-01-11 MED ORDER — SODIUM CHLORIDE 0.9 % IV SOLN
100.0000 mL/h | INTRAVENOUS | Status: DC
  Administered 2017-01-11: 100 mL/h via INTRAVENOUS

## 2017-01-11 NOTE — Discharge Instructions (Signed)
As discussed, your evaluation today has been largely reassuring.  But, it is important that you monitor your condition carefully, and do not hesitate to return to the ED if you develop new, or concerning changes in your condition.  Otherwise, please follow-up with your physician for appropriate ongoing care.  Please be sure to discuss your sleep apnea, your current medication regimen, and appropriate steps for ongoing monitoring.

## 2017-01-11 NOTE — ED Notes (Signed)
SPO2 remains 98-100% on RA. Pt and family verbalize understanding of EDP desire for admission. Pt and family verbalize desire to see PCP on Monday.

## 2017-01-11 NOTE — ED Notes (Signed)
H/o CVA 07/2016. EDP aware of recent neuro exam results. Somnolent, arousable to voice and mild physical stimuli, NAD, calm, passively and intermittently interactive, resps shallow, low SPO2 83 on RA, placed on O2 4L [place], speaking in one word answers, some inappropriate words chosen in responses, no dyspnea noted, skin W&D, VSS except SPO2, improved on O2, tearful and c/o chronic back pain, worse with assessment and physical exam, poor responses "d/t pain", (denies: sob, nausea, dizziness or visual changes). EDP aware. Family at [facility].

## 2017-01-11 NOTE — ED Notes (Signed)
Dr. Malachi Carl at [facility]. O2 changed to RA, SPO2 remains 100%. Pt exam, speech and mentation improved. Discussing with pt and husband about sedating effects of norco and gabapentin together.

## 2017-01-11 NOTE — ED Provider Notes (Signed)
[facility] Provider Note   CSN: [id number] Arrival date & time: 01/11/17  1848     History   Chief Complaint Chief Complaint  Patient presents with  . Altered Mental Status    HPI Heidi Green is a 61 y.o. female.  HPI  Heidi Green is a 61 y.o. 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  She now presents with her husband due to his concern of worsening mental status changes. Baseline weakness on the right is generally unchanged, but according the patient's husband she is less appropriate and interactive, though she remains verbal, awake and alert. Changes began about 2 hours ago.  The patient is awake and alert, declining our efforts to help her transfer from wheelchair to bed. Patient answers some questions properly, other questions nonsensically, repetitively. She is disoriented, but specifically denies pain, discomfort. Level V caveat secondary to altered mental status.   Past Medical History:  Diagnosis Date  . Arthritis    in back  . Back pain   . Chest pain 06/16/2012   negative cath with normal LV function  . Headache(784.0)   . Hypertension   . Kidney stones   . Stroke [facility])     Patient Active Problem List   Diagnosis Date Noted  . Right shoulder pain 10/22/2016  . Hypertensive emergency 08/01/2016  . Dysphagia following nontraumatic intracerebral hemorrhage 08/01/2016  . Hypokalemia 08/01/2016  . Agitation 08/01/2016  . Non-compliance 08/01/2016  . Allergic rhinitis 08/01/2016  . 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 Class 2 obesity   . Acute encephalopathy 07/18/2016  . NSTEMI (non-ST elevated myocardial infarction) (HCC) 06/17/2012  . Chest pain 06/16/2012  . HTN (hypertension) 06/16/2012  . Dyslipidemia 06/16/2012  . Back pain 06/16/2012  . Tobacco abuse  06/16/2012    Past Surgical History:  Procedure Laterality Date  . ABDOMINAL HYSTERECTOMY    . BACK SURGERY     x 3  . LEFT HEART CATHETERIZATION WITH CORONARY ANGIOGRAM N/A 06/16/2012   Procedure: LEFT HEART CATHETERIZATION WITH CORONARY ANGIOGRAM;  Surgeon: Peter M Swaziland, MD;  Location: [facility] CATH LAB;  Service: Cardiovascular;  Laterality: N/A;  . LITHOTRIPSY    . TUBAL LIGATION      OB History    No data available       Home Medications    Prior to Admission medications   Medication Sig Start Date End Date Taking? Authorizing Provider  acetaminophen (TYLENOL) 325 MG tablet Take 2 tablets (650 mg total) by mouth every 4 (four) hours as needed for mild pain (or temp > 37.5 C (99.5 F)). Combination of acetaminophen in vicodin and tylenol should not exceed max dose of  daily 08/01/16   Layne Benton, NP  baclofen (LIORESAL) 10 MG tablet Take 10 mg by mouth every 12 (twelve) hours as needed.  06/21/16   [provider]  cyanocobalamin (,VITAMIN B-12,) 1000 MCG/ML injection Inject 1,000 mcg into the muscle every 30 (thirty) days.  07/11/16   [provider]  diclofenac (VOLTAREN) 50 MG EC tablet TK 1 T PO BID 10/11/16   [provider]  DULoxetine (CYMBALTA) 30 MG capsule TK 1 C PO D 10/11/16   [provider]  fluticasone (FLONASE) 50 MCG/ACT nasal spray Place 2 sprays into both nostrils daily.  [provider]  furosemide (LASIX) 20 MG tablet Take 20 mg by mouth daily.    [provider]  Hydrocodone-Acetaminophen 2.5-325 MG TABS Take 1 tablet by mouth every 8 (eight) hours as needed.    [provider]  lisinopril (PRINIVIL,ZESTRIL) 2.5 MG tablet Take 2.5 mg by mouth daily. 09/13/16 dose unknown     [provider]  loratadine (CLARITIN) 10 MG tablet Take 10 mg by mouth daily.    [provider]  Melatonin 5 MG TABS Take 5 mg by mouth at bedtime.    [provider]  Menthol, Topical Analgesic,  (BIOFREEZE) 4 % GEL Apply 1 application topically every 8 (eight) hours as needed (pain). Apply to back    [provider]  naproxen (NAPROSYN) 375 MG tablet Take 1 tablet (375 mg total) by mouth 2 (two) times daily. With food 10/15/16   Palumbo, April, MD  Nutritional Supplements (NUTRITIONAL SUPPLEMENT PO) Take 1 each by mouth daily. Magic Cup    [provider]  potassium chloride (K-DUR,KLOR-CON) 10 MEQ tablet Take 10 mEq by mouth 2 (two) times daily.    [provider]  rOPINIRole (REQUIP) 2 MG tablet Take 2 mg by mouth 2 (two) times daily.     [provider]  senna-docusate (SENOKOT-S) 8.6-50 MG tablet Take 1 tablet by mouth 2 (two) times daily. 08/01/16   Layne Benton, NP    Family History Family History  Problem Relation Age of Onset  . Emphysema Mother   . Diabetes Mother     Social History Social History  Substance Use Topics  . Smoking status: Former Smoker    Packs/day: 0.50    Years: 26.00    Types: Cigarettes    Quit date: 06/17/2012  . Smokeless tobacco: Never Used  . Alcohol use No     Allergies   Garamycin [gentamicin sulfate]; Penicillins; and Norvasc [amlodipine besylate]   Review of Systems Review of Systems  Unable to perform ROS: Mental status change     Physical Exam Updated Vital Signs BP (!) 161/82 (BP Location: Left Arm)   Pulse 82   Temp 98.7 F (37.1 C) (Oral)   Resp 20   Ht  (1.702 m)   Wt 99.8 kg (220 lb)   SpO2 98%   BMI 34.46 kg/m   Physical Exam  Constitutional: She appears well-developed and well-nourished.  HENT:  Head: Normocephalic and atraumatic.  Largely edentulous  Eyes: Conjunctivae and EOM are normal.  Cardiovascular: Normal rate and regular rhythm.   Pulmonary/Chest: Effort normal and breath sounds normal. No stridor. No respiratory distress.  Abdominal: She exhibits no distension.  Musculoskeletal: She exhibits no edema.  Neurological: She is alert. No cranial nerve deficit.   Right lower extremity minimal functionality, right upper term 4/5 strength, left side strength upper and lower 5/5. No facial asymmetry, speech is garbled, repetitive, tangential, nonsensical, but occasionally appropriate.   Skin: Skin is warm and dry.  Psychiatric: She has a normal mood and affect.  Nursing note and vitals reviewed.    ED Treatments / Results  Labs (all labs ordered are listed, but only abnormal results are displayed) Labs Reviewed  CBC - Abnormal; Notable for the following:       Result Value   WBC 10.7 (*)    All other components within normal limits  ETHANOL  PROTIME-INR  APTT  DIFFERENTIAL  COMPREHENSIVE METABOLIC PANEL  TROPONIN I  RAPID URINE DRUG SCREEN, HOSP PERFORMED  URINALYSIS,  ROUTINE W REFLEX MICROSCOPIC    EKG  EKG Interpretation  Date/Time:  Saturday January 11 2017 19:12:08 EDT Ventricular Rate:  77 PR Interval:    QRS Duration: 103 QT Interval:  413 QTC Calculation: 468 R Axis:   -37 Text Interpretation:  Sinus rhythm Multiple ventricular premature complexes Left axis deviation RSR' in V1 or V2, probably normal variant Abnormal ekg Confirmed by Gerhard Munch [phone]) on 01/11/2017 7:16:42 PM       Radiology Ct Head Wo Contrast  Result Date: 01/11/2017 CLINICAL DATA:  Altered level of consciousness. EXAM: CT HEAD WITHOUT CONTRAST TECHNIQUE: Contiguous axial images were obtained from the base of the skull through the vertex without intravenous contrast. COMPARISON:  11/05/2016 FINDINGS: Brain: No evidence of acute infarction, hemorrhage, hydrocephalus, extra-axial collection or mass lesion/mass effect. There is mild diffuse low-attenuation within the subcortical and periventricular white matter compatible with chronic microvascular disease. Vascular: No hyperdense vessel or unexpected calcification. Skull: Normal. Negative for fracture or focal lesion. Sinuses/Orbits: Mucosal thickening involving the sphenoid sinus noted. The remaining  paranasal sinuses are clear. Other: None. IMPRESSION: 1. No acute intracranial abnormalities. 2. Chronic small vessel ischemic change. Electronically Signed   By: Signa Kell M.D.   On: 01/11/2017 20:12    Procedures Procedures (including critical care time)  Medications Ordered in ED Medications  sodium chloride 0.9 % bolus 500 mL (500 mLs Intravenous New Bag/Given 01/11/17 2117)    Followed by  0.9 %  sodium chloride infusion (100 mL/hr Intravenous New Bag/Given 01/11/17 2117)  naloxone [facility]) injection 0.4 mg (not administered)     Initial Impression / Assessment and Plan / ED Course  I have reviewed the triage vital signs and the nursing notes.  Pertinent labs & imaging results that were available during my care of the patient were reviewed by me and considered in my medical decision making (see chart for details).  Clinical Course as of Jan 11 2157  Sat Jan 11, 2017  2021 Patient much more awake, interactive, answering, offering commands consistently, following directions consistently.  [RL]    Clinical Course User Index [RL] Gerhard Munch, MD    9:59 PM Patient much more awake, alert, answering questions appropriately, states that she wants to go home. When asked if she understands why she is here she answers in the affirmative, states that she has a lot of pain, denies confusion. With her husband present we discussed findings as far including no evidence for neutropenia pathology, no evidence for infection, no evidence for electro abnormalities. We now discuss possibility of overmedication versus occult intracranial phenomena. With no evidence for new bleed, and no evidence for new neurologic dysfunction, stroke is less likely. Patient not a candidate for antiplatelet medication given her recent hemorrhagic stroke. They now acknowledge the patient doesn't history of sleep apnea, is not currently using any therapy for this, lending suspicion to the idea of patient being  overly medicated, and suffering effects of sleep apnea as contributing to her confusion on presentation which was right after she awoke from a nap..   Final Clinical Impressions(s) / ED Diagnoses  Altered mental status   Gerhard Munch, MD 01/11/17 2200

## 2017-01-11 NOTE — ED Triage Notes (Signed)
Pt husband states pt had stroke in April with residual R side weakness. Pt fell today, unwitnessed. She took a nap and when she woke up at 6pm husband states pt is not acting right with confusion and not answering questions appropriately.

## 2017-01-11 NOTE — ED Notes (Signed)
ED Provider at bedside. 

## 2017-01-15 ENCOUNTER — Ambulatory Visit (INDEPENDENT_AMBULATORY_CARE_PROVIDER_SITE_OTHER): Payer: Medicare HMO | Admitting: Diagnostic Neuroimaging

## 2017-01-15 ENCOUNTER — Encounter: Payer: Self-pay | Admitting: Diagnostic Neuroimaging

## 2017-01-15 VITALS — BP 148/97 | HR 76

## 2017-01-15 DIAGNOSIS — I610 Nontraumatic intracerebral hemorrhage in hemisphere, subcortical: Secondary | ICD-10-CM

## 2017-01-15 DIAGNOSIS — R202 Paresthesia of skin: Secondary | ICD-10-CM

## 2017-01-15 DIAGNOSIS — R410 Disorientation, unspecified: Secondary | ICD-10-CM

## 2017-01-15 DIAGNOSIS — R200 Anesthesia of skin: Secondary | ICD-10-CM | POA: Diagnosis not present

## 2017-01-15 DIAGNOSIS — I10 Essential (primary) hypertension: Secondary | ICD-10-CM | POA: Diagnosis not present

## 2017-01-15 NOTE — Progress Notes (Signed)
[facility]  PATIENT: Heidi Green DOB: 04/19/1955  REFERRING CLINICIAN: Pearlean Brownie HISTORY FROM: patient and husband  REASON FOR VISIT: follow up   HISTORICAL  CHIEF COMPLAINT:  Chief Complaint  Patient presents with  . Numbness    rm 7, husband- Brett Canales, "recent episode of altered mental status, increase in BP; wants increase in Requip"  . Follow-up    HISTORY OF PRESENT ILLNESS:   UPDATE (01/15/17, VRP): Since last visit, was doing well until 01/11/17, when she had woke up confused and out of it per husband. They went to ER, had elevated BP, and evaluation. Dx'd with delirium due to suspected sleep apnea + gabapentin + hydrocodone side effects. She improved in the ER, and then d/c'd home. Had follow up with PCP on Monday, and lisinopril was increased.   Continues with low back pain, left leg pain, on hydrocodone. Now off of baclofen and gabapentin.   UPDATE 10/21/16: Since last visit, was doing well until 10/15/16, when she woke up with new and different right hand numbness and weakness. Also noted swelling in right shoulder. Therefore went to the ER. Dx'd with fluid in bursa and sent to sports medicine clinic. There, she was dx'd with right shoulder lipoma, and referred back here to find out etiology of right hand numbness.   PRIOR HPI (10/15/16): 61 year old female with hypertension, here for evaluation of hospital stroke discharge follow-up. Patient has history of arthritis, chronic pain, chronic narcotic use, hypertension, kidney stones, was found at home with right hemiparesis in early April 2018. Patient was found to have left basal ganglia intracerebral hemorrhage with significant hypertension of 181/104. Patient was admitted to neuro ICU for medical management. Patient was stabilized medically and discharged to skilled nursing facility. Since that time patient has returned back to home. She's been home for the past one week. She is doing well. Her right upper extremity and  speech difficulty has significantly improved. Right leg weakness still continues. She is working with home physical and occupational therapy. She is tolerating her medications.   REVIEW OF SYSTEMS: Full 14 system review of systems performed and negative with exception of: numbness.   ALLERGIES: Allergies  Allergen Reactions  . Garamycin [Gentamicin Sulfate] Hives and Shortness Of Breath  . Other Anaphylaxis    HORSE SERUM  . Penicillins Anaphylaxis, Itching and Swelling    Has patient had a PCN reaction causing immediate rash, facial/tongue/throat swelling, SOB or lightheadedness with hypotension: Yes Has patient had a PCN reaction causing severe rash involving mucus membranes or skin necrosis: No Has patient had a PCN reaction that required hospitalization No Has patient had a PCN reaction occurring within the last 10 years: No If all of the above answers are "NO", then may proceed with Cephalosporin use.   Marland Kitchen Norvasc [Amlodipine Besylate] Nausea And Vomiting    HOME MEDICATIONS: Outpatient Medications Prior to Visit  Medication Sig Dispense Refill  . acetaminophen (TYLENOL) 325 MG tablet Take 2 tablets (650 mg total) by mouth every 4 (four) hours as needed for mild pain (or temp > 37.5 C (99.5 F)). Combination of acetaminophen in vicodin and tylenol should not exceed max dose of  daily    . baclofen (LIORESAL) 10 MG tablet Take 10 mg by mouth every 12 (twelve) hours as needed.     . cyanocobalamin (,VITAMIN B-12,) 1000 MCG/ML injection Inject 1,000 mcg into the muscle every 30 (thirty) days.     . diclofenac (VOLTAREN) 50 MG EC tablet TK 1 T  PO BID  0  . DULoxetine (CYMBALTA) 30 MG capsule TK 1 C PO D  2  . fluticasone (FLONASE) 50 MCG/ACT nasal spray Place 2 sprays into both nostrils daily.     . furosemide (LASIX) 20 MG tablet Take 20 mg by mouth daily.    . Hydrocodone-Acetaminophen 2.5-325 MG TABS Take 1 tablet by mouth every 8 (eight) hours as needed.    . loratadine  (CLARITIN) 10 MG tablet Take 10 mg by mouth daily.    . Melatonin 5 MG TABS Take 5 mg by mouth at bedtime.    . Menthol, Topical Analgesic, (BIOFREEZE) 4 % GEL Apply 1 application topically every 8 (eight) hours as needed (pain). Apply to back    . naproxen (NAPROSYN) 375 MG tablet Take 1 tablet (375 mg total) by mouth 2 (two) times daily. With food 20 tablet 0  . Nutritional Supplements (NUTRITIONAL SUPPLEMENT PO) Take 1 each by mouth daily. Magic Cup    . potassium chloride (K-DUR,KLOR-CON) 10 MEQ tablet Take 10 mEq by mouth 2 (two) times daily.    Marland Kitchen rOPINIRole (REQUIP) 2 MG tablet Take 2 mg by mouth 2 (two) times daily.     Marland Kitchen senna-docusate (SENOKOT-S) 8.6-50 MG tablet Take 1 tablet by mouth 2 (two) times daily. (Patient not taking: Reported on 01/15/2017)    . lisinopril (PRINIVIL,ZESTRIL) 2.5 MG tablet Take 2.5 mg by mouth daily. 09/13/16 dose unknown      No facility-administered medications prior to visit.     PAST MEDICAL HISTORY: Past Medical History:  Diagnosis Date  . Arthritis    in back  . Back pain   . Chest pain 06/16/2012   negative cath with normal LV function  . Headache(784.0)   . Hypertension   . Kidney stones   . Stroke [facility])     PAST SURGICAL HISTORY: Past Surgical History:  Procedure Laterality Date  . ABDOMINAL HYSTERECTOMY    . BACK SURGERY     x 3  . LEFT HEART CATHETERIZATION WITH CORONARY ANGIOGRAM N/A 06/16/2012   Procedure: LEFT HEART CATHETERIZATION WITH CORONARY ANGIOGRAM;  Surgeon: Peter M Swaziland, MD;  Location: [facility] CATH LAB;  Service: Cardiovascular;  Laterality: N/A;  . LITHOTRIPSY    . TUBAL LIGATION      FAMILY HISTORY: Family History  Problem Relation Age of Onset  . Emphysema Mother   . Diabetes Mother     SOCIAL HISTORY:  Social History   Social History  . Marital status: Married    Spouse name: Brett Canales  . Number of children: 2  . Years of education: 14   Occupational History  .      retired Public house manager   Social History Main Topics    . Smoking status: Former Smoker    Packs/day: 0.50    Years: 26.00    Types: Cigarettes    Quit date: 06/17/2012  . Smokeless tobacco: Never Used  . Alcohol use No  . Drug use: No  . Sexual activity: Not Currently   Other Topics Concern  . Not on file   Social History Narrative   Lives at home with husband     PHYSICAL EXAM  GENERAL EXAM/CONSTITUTIONAL: Vitals:  Vitals:   01/15/17 0846  BP: (!) 148/97  Pulse: 76   There is no height or weight on file to calculate BMI. No exam data present  Patient is in no distress; well developed, nourished and groomed; neck is supple  CARDIOVASCULAR:  Examination of carotid arteries  is normal; no carotid bruits  Regular rate and rhythm, no murmurs  Examination of peripheral vascular system by observation and palpation is normal  EYES:  Ophthalmoscopic exam of optic discs and posterior segments is normal; no papilledema or hemorrhages  MUSCULOSKELETAL:  Gait, strength, tone, movements noted in Neurologic exam below  NEUROLOGIC: MENTAL STATUS:  No flowsheet data found.  awake, alert, oriented to person, place and time  recent and remote memory intact  normal attention and concentration  language fluent, comprehension intact, naming intact,   fund of knowledge appropriate  CRANIAL NERVE:   2nd - no papilledema on fundoscopic exam  2nd, 3rd, 4th, 6th - pupils equal and reactive to light, visual fields full to confrontation, extraocular muscles intact, no nystagmus  5th - facial sensation --> DECR ON RIGHT FACE  7th - facial strength --> DECR RIGHT LOWER FACIAL STRENGTH  8th - hearing intact  9th - palate elevates symmetrically, uvula midline  11th - shoulder shrug symmetric  12th - tongue protrusion midline  MILD DYSARTHRIA  MOTOR:   normal bulk and tone, full strength in the LUE, LLE  RUE 3 PROX; 3-4 DISTAL  RLE 3 PROX; 2 DISTAL  SENSORY:   normal and symmetric to light touch, temperature,  vibration  DECR IN RIGHT ARM AND RIGHT LEG TO ALL MODALITIES  COORDINATION:   finger-nose-finger, fine finger movements SLOW ON RIGHT  REFLEXES:   deep tendon reflexes present and SLIGHTLY BRISK IN RUE  GAIT/STATION:   IN WHEEL CHAIR    DIAGNOSTIC DATA (LABS, IMAGING, TESTING) - I reviewed patient records, labs, notes, testing and imaging myself where available.  Lab Results  Component Value Date   WBC 10.7 (H) 01/11/2017   HGB 13.7 01/11/2017   HCT 42.1 01/11/2017   MCV 89.4 01/11/2017   PLT 236 01/11/2017      Component Value Date/Time   NA 139 01/11/2017 2100   NA 140 08/26/2016   K 3.7 01/11/2017 2100   CL 105 01/11/2017 2100   CO2 25 01/11/2017 2100   GLUCOSE 99 01/11/2017 2100   BUN 19 01/11/2017 2100   BUN 16 08/26/2016   CREATININE 0.77 01/11/2017 2100   CALCIUM 9.0 01/11/2017 2100   PROT 7.3 01/11/2017 2100   ALBUMIN 4.1 01/11/2017 2100   AST 29 01/11/2017 2100   ALT 24 01/11/2017 2100   ALKPHOS 91 01/11/2017 2100   BILITOT 0.3 01/11/2017 2100   GFRNONAA >60 01/11/2017 2100   GFRAA >60 01/11/2017 2100   Lab Results  Component Value Date   CHOL 195 07/25/2016   HDL 65 07/25/2016   LDLCALC 109 (H) 07/25/2016   TRIG 107 07/25/2016   CHOLHDL 3.0 07/25/2016   Lab Results  Component Value Date   HGBA1C 5.2 07/25/2016   Lab Results  Component Value Date   VITAMINB12 1,181 (H) 07/25/2016   Lab Results  Component Value Date   TSH 1.527 07/25/2016    08/15/16 CT head [I reviewed images myself and agree with interpretation. -VRP]  - A known left basal ganglial intraparenchymal hemorrhage is resolving; it is decreased in size and less dense compared to prior. There is decreased surrounding mass effect. There is residual 4.5 mm midline shift to the right. There is no new hemorrhage identified.  07/28/16 MRI brain / MRA head [I reviewed images myself and agree with interpretation. -VRP]  1. Acute intra-axial hemorrhage in the left lentiform with  estimated blood volume of 26 mL has not significantly changed since 07/24/2016. Surrounding  edema and regional mass effect including partial effacement of the left lateral ventricle, but no intraventricular hemorrhage, extra-axial hemorrhage, or ventriculomegaly. 2. Negative intracranial MRA aside from intracranial artery tortuosity. 3. Moderate to severe for age nonspecific cerebral white matter signal changes. Top differential considerations include chronic small vessel disease and chronic demyelinating disease.   07/26/16 TTE - Left ventricle: The cavity size was normal. There was mild   concentric hypertrophy. Systolic function was normal. The   estimated ejection fraction was in the range of 55% to 60%. The   study is not technically sufficient to allow evaluation of LV   diastolic function. - Limited, poor technical quality study. It should be repeated when   the patient is able to cooperate.  11/05/16 Abnormal MRI brain (without) demonstrating: 1. Chronic left basal ganglia/external capsule intracerebral hemorrhage. 2. Moderate chronic small vessel ischemic disease. 3. No acute findings. 4. Compared to CT on 08/15/16, there has been expected decrease in size of intracerebral hemorrhage. No findings to suggest underlying mass or vascular malformation.  01/11/17 CT head  1. No acute intracranial abnormalities. 2. Chronic small vessel ischemic change.      ASSESSMENT AND PLAN  61 y.o. year old female here with left basal ganglia intracerebral hemorrhage due to hypertension in April 2018, now doing well. Symptoms were gradually improving.   Had some recurrence and worsening of symptoms on 10/15/16, likely related to right shoulder pain issue. Also with right shoulder lipoma vs other fluid collection.  Also with episode of confusion on 01/11/17, waking up out of sleep. Now resolved.    Dx:   1. 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 Numbness and  tingling in right hand   3. Essential hypertension   4. Delirium      PLAN:  DELIRIUM (due to hypertension, undiagnosed sleep apnea, medication side effects) - follow up with PCP re: pain mgmt, sleep study - caution with CNS active medications  HYPERTENSION - follow up with PCP  LOW BACK PAIN / RLS - per pain mgmt and orthopedic surgery  Return if symptoms worsen or fail to improve, for return to PCP.    Suanne Marker, MD 01/15/2017, 9:09 AM Certified in Neurology, Neurophysiology and Neuroimaging  [facility] [address] [place], [place] [address] [phone]

## 2017-01-19 ENCOUNTER — Emergency Department (HOSPITAL_COMMUNITY)
Admission: EM | Admit: 2017-01-19 | Discharge: 2017-01-19 | Disposition: A | Discharge status: Home / Self Care | Payer: Medicare HMO | Attending: Emergency Medicine | Admitting: Emergency Medicine

## 2017-01-19 ENCOUNTER — Emergency Department (HOSPITAL_COMMUNITY): Payer: Medicare HMO

## 2017-01-19 ENCOUNTER — Encounter (HOSPITAL_COMMUNITY): Payer: Self-pay | Admitting: Emergency Medicine

## 2017-01-19 ENCOUNTER — Emergency Department (HOSPITAL_BASED_OUTPATIENT_CLINIC_OR_DEPARTMENT_OTHER)
Admit: 2017-01-19 | Discharge: 2017-01-19 | Disposition: A | Discharge status: Home / Self Care | Payer: Medicare HMO | Attending: Emergency Medicine | Admitting: Emergency Medicine

## 2017-01-19 DIAGNOSIS — R51 Headache: Secondary | ICD-10-CM | POA: Insufficient documentation

## 2017-01-19 DIAGNOSIS — I10 Essential (primary) hypertension: Secondary | ICD-10-CM

## 2017-01-19 DIAGNOSIS — Z8673 Personal history of transient ischemic attack (TIA), and cerebral infarction without residual deficits: Secondary | ICD-10-CM | POA: Insufficient documentation

## 2017-01-19 DIAGNOSIS — R531 Weakness: Secondary | ICD-10-CM | POA: Diagnosis not present

## 2017-01-19 DIAGNOSIS — Z87891 Personal history of nicotine dependence: Secondary | ICD-10-CM | POA: Insufficient documentation

## 2017-01-19 DIAGNOSIS — Z79899 Other long term (current) drug therapy: Secondary | ICD-10-CM | POA: Diagnosis not present

## 2017-01-19 DIAGNOSIS — R519 Headache, unspecified: Secondary | ICD-10-CM

## 2017-01-19 LAB — URINALYSIS, ROUTINE W REFLEX MICROSCOPIC
Bilirubin Urine: NEGATIVE
GLUCOSE, UA: NEGATIVE mg/dL
Hgb urine dipstick: NEGATIVE
Ketones, ur: NEGATIVE mg/dL
Leukocytes, UA: NEGATIVE
Nitrite: POSITIVE — AB
PH: 8 (ref 5.0–8.0)
PROTEIN: NEGATIVE mg/dL
SPECIFIC GRAVITY, URINE: 1.008 (ref 1.005–1.030)

## 2017-01-19 LAB — CBC
HCT: 45.8 % (ref 36.0–46.0)
Hemoglobin: 15.4 g/dL — ABNORMAL HIGH (ref 12.0–15.0)
MCH: 29.8 pg (ref 26.0–34.0)
MCHC: 33.6 g/dL (ref 30.0–36.0)
MCV: 88.6 fL (ref 78.0–100.0)
PLATELETS: 264 10*3/uL (ref 150–400)
RBC: 5.17 MIL/uL — ABNORMAL HIGH (ref 3.87–5.11)
RDW: 13.7 % (ref 11.5–15.5)
WBC: 9.8 10*3/uL (ref 4.0–10.5)

## 2017-01-19 LAB — COMPREHENSIVE METABOLIC PANEL
ALBUMIN: 4.7 g/dL (ref 3.5–5.0)
ALK PHOS: 100 U/L (ref 38–126)
ALT: 21 U/L (ref 14–54)
AST: 24 U/L (ref 15–41)
Anion gap: 11 (ref 5–15)
BILIRUBIN TOTAL: 0.6 mg/dL (ref 0.3–1.2)
BUN: 16 mg/dL (ref 6–20)
CALCIUM: 9.6 mg/dL (ref 8.9–10.3)
CO2: 26 mmol/L (ref 22–32)
Chloride: 105 mmol/L (ref 101–111)
Creatinine, Ser: 0.7 mg/dL (ref 0.44–1.00)
GFR calc Af Amer: >60 mL/min (ref >60)
GLUCOSE: 92 mg/dL (ref 65–99)
Potassium: 3.4 mmol/L — ABNORMAL LOW (ref 3.5–5.1)
Sodium: 142 mmol/L (ref 135–145)
TOTAL PROTEIN: 8.1 g/dL (ref 6.5–8.1)

## 2017-01-19 LAB — I-STAT TROPONIN, ED: Troponin i, poc: 0 ng/mL (ref 0.00–0.08)

## 2017-01-19 LAB — CBG MONITORING, ED: GLUCOSE-CAPILLARY: 90 mg/dL (ref 65–99)

## 2017-01-19 MED ORDER — DIPHENHYDRAMINE HCL 50 MG/ML IJ SOLN
25.0000 mg | Freq: Once | INTRAMUSCULAR | Status: AC
  Administered 2017-01-19: 25 mg via INTRAVENOUS
  Filled 2017-01-19: qty 1

## 2017-01-19 MED ORDER — SODIUM CHLORIDE 0.9 % IV BOLUS (SEPSIS)
1000.0000 mL | Freq: Once | INTRAVENOUS | Status: AC
  Administered 2017-01-19: 1000 mL via INTRAVENOUS

## 2017-01-19 MED ORDER — LISINOPRIL 5 MG PO TABS
5.0000 mg | ORAL_TABLET | Freq: Once | ORAL | Status: AC
  Administered 2017-01-19: 5 mg via ORAL
  Filled 2017-01-19: qty 1

## 2017-01-19 MED ORDER — METOCLOPRAMIDE HCL 5 MG/ML IJ SOLN
10.0000 mg | Freq: Once | INTRAMUSCULAR | Status: AC
  Administered 2017-01-19: 10 mg via INTRAVENOUS
  Filled 2017-01-19: qty 2

## 2017-01-19 MED ORDER — ROPINIROLE HCL 1 MG PO TABS
2.0000 mg | ORAL_TABLET | Freq: Once | ORAL | Status: AC
  Administered 2017-01-19: 2 mg via ORAL
  Filled 2017-01-19: qty 2

## 2017-01-19 MED ORDER — IOPAMIDOL (ISOVUE-370) INJECTION 76%
INTRAVENOUS | Status: AC
  Administered 2017-01-19: 100 mL via INTRAVENOUS
  Filled 2017-01-19: qty 100

## 2017-01-19 MED ORDER — PROMETHAZINE HCL 25 MG/ML IJ SOLN
25.0000 mg | Freq: Once | INTRAMUSCULAR | Status: DC
  Filled 2017-01-19: qty 1

## 2017-01-19 NOTE — ED Triage Notes (Addendum)
Pt reports bilateral leg pain, HA, weakness, restless legs and confusion for the past week. Hx of a stroke in April. Went to [facility] a week ago for same, but symptoms continue.

## 2017-01-19 NOTE — Discharge Instructions (Signed)
You need to take your medicines as prescribed, especially your lisinopril.   See your doctor as scheduled tomorrow.   Consider sleep study for sleep apnea   Return to ER if you have worse headaches, vomiting, confusion, fever.

## 2017-01-19 NOTE — ED Provider Notes (Signed)
Heidi Green   CSN: [id number] Arrival date & time: 01/19/17  1626     History   Chief Complaint Chief Complaint  Patient presents with  . Headache    HPI Heidi Green is a 61 y.o. female history of hypertension, previous hemorrhagic stroke here presenting with worsening headaches, ongoing confusion. Patient was seen in the ED about a week ago for delirium that was thought to be secondary to hypertension, possible undiagnosed sleep apnea, medication side effects. Patient was seen by neurology 4 days ago and no further recommendation was made. As per the husband, she wakes up in the morning confused and the confusion generally improves during the day. Today she woke up and had severe headaches and was noted to be hypertensive. Patient denies any chest pain or abdominal pain or vomiting. Patient states that she is weaker on the left leg worse than baseline. Denies trouble speaking.   The history is provided by the patient.    Past Medical History:  Diagnosis Date  . Arthritis    in back  . Back pain   . Chest pain 06/16/2012   negative cath with normal LV function  . Headache(784.0)   . Hypertension   . Kidney stones   . Stroke [facility])     Patient Active Problem List   Diagnosis Date Noted  . Right shoulder pain 10/22/2016  . Hypertensive emergency 08/01/2016  . Dysphagia following nontraumatic intracerebral hemorrhage 08/01/2016  . Hypokalemia 08/01/2016  . Agitation 08/01/2016  . Non-compliance 08/01/2016  . Allergic rhinitis 08/01/2016  . 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 Class 2 obesity   . Acute encephalopathy 07/18/2016  . NSTEMI (non-ST elevated myocardial infarction) (HCC) 06/17/2012  . Chest pain 06/16/2012  . HTN (hypertension) 06/16/2012  . Dyslipidemia 06/16/2012  . Back pain 06/16/2012  . Tobacco abuse 06/16/2012    Past Surgical History:  Procedure Laterality Date  . ABDOMINAL  HYSTERECTOMY    . BACK SURGERY     x 3  . LEFT HEART CATHETERIZATION WITH CORONARY ANGIOGRAM N/A 06/16/2012   Procedure: LEFT HEART CATHETERIZATION WITH CORONARY ANGIOGRAM;  Surgeon: Peter M Swazil[place], MD;  Location: [facility] CATH LAB;  Service: Cardiovascular;  Laterality: N/A;  . LITHOTRIPSY    . TUBAL LIGATION      OB History    No data available       Home Medications    Prior to Admission medications   Medication Sig Start Date End Date Taking? Authorizing Provider  acetaminophen (TYLENOL) 325 MG tablet Take 2 tablets (650 mg total) by mouth every 4 (four) hours as needed for mild pain (or temp > 37.5 C (99.5 F)). Combination of acetaminophen in vicodin and tylenol should not exceed max dose of  daily 08/01/16  Yes Biby, Jani Files, NP  atorvastatin (LIPITOR) 40 MG tablet Take 40 mg by mouth daily. 01/13/17  Yes [provider]  diclofenac (VOLTAREN) 50 MG EC tablet TK 1 T PO BID 10/11/16  Yes [provider]  DULoxetine (CYMBALTA) 30 MG capsule TK 1 C PO D 10/11/16  Yes [provider]  fluticasone (FLONASE) 50 MCG/ACT nasal spray Place 2 sprays into both nostrils daily as needed for allergies.    Yes [provider]  furosemide (LASIX) 20 MG tablet Take 20 mg by mouth daily.   Yes [provider]  gabapentin (NEURONTIN) 100 MG capsule Take 100 mg by mouth daily.   Yes  [provider]  Hydrocodone-Acetaminophen 2.5-325 MG TABS Take 1 tablet by mouth every 8 (eight) hours as needed (pain).    Yes [provider]  lisinopril (PRINIVIL,ZESTRIL) 5 MG tablet 5 mg daily. 01/13/17  Yes [provider]  loratadine (CLARITIN) 10 MG tablet Take 10 mg by mouth daily as needed for allergies.    Yes [provider]  Melatonin 5 MG TABS Take 5 mg by mouth daily as needed (sleep).    Yes [provider]  Menthol, Topical Analgesic, (BIOFREEZE) 4 % GEL Apply 1 application topically every 8 (eight) hours as needed  (pain). Apply to back   Yes [provider]  Nutritional Supplements (NUTRITIONAL SUPPLEMENT PO) Take 1 each by mouth daily. Magic Cup   Yes [provider]  potassium chloride (K-DUR,KLOR-CON) 10 MEQ tablet Take 10 mEq by mouth 2 (two) times daily.   Yes [provider]  rOPINIRole (REQUIP) 2 MG tablet Take 2 mg by mouth 2 (two) times daily.    Yes [provider]  Vitamin D, Ergocalciferol, (DRISDOL) 50000 units CAPS capsule Take 1 capsule by mouth once a week. 01/10/17  Yes [provider]  naproxen (NAPROSYN) 375 MG tablet Take 1 tablet (375 mg total) by mouth 2 (two) times daily. With food Patient not taking: Reported on 01/19/2017 10/15/16   Palumbo, April, MD  senna-docusate (SENOKOT-S) 8.6-50 MG tablet Take 1 tablet by mouth 2 (two) times daily. Patient not taking: Reported on 01/15/2017 08/01/16   Layne Benton, NP    Family History Family History  Problem Relation Age of Onset  . Emphysema Mother   . Diabetes Mother     Social History Social History  Substance Use Topics  . Smoking status: Former Smoker    Packs/day: 0.50    Years: 26.00    Types: Cigarettes    Quit date: 06/17/2012  . Smokeless tobacco: Never Used  . Alcohol use No     Allergies   Garamycin [gentamicin sulfate]; Other; Penicillins; and Norvasc [amlodipine besylate]   Review of Systems Review of Systems  Neurological: Positive for headaches.  Psychiatric/Behavioral: Positive for confusion.  All other systems reviewed and are negative.    Physical Exam Updated Vital Signs BP (!) 179/99 (BP Location: Left Arm)   Pulse 79   Temp 97.8 F (36.6 C) (Oral)   Resp 18   SpO2 90%   Physical Exam  Constitutional: She is oriented to person, place, and time.  Chronically ill   HENT:  Head: Normocephalic.  Mouth/Throat: Oropharynx is clear and moist.  Eyes: Pupils are equal, round, and reactive to light. Conjunctivae and EOM are normal.  Neck: Normal range  of motion. Neck supple.  Cardiovascular: Normal rate, regular rhythm and normal heart sounds.   Pulmonary/Chest: Effort normal and breath sounds normal. No respiratory distress. She has no wheezes. She has no rales.  Abdominal: Soft. Bowel sounds are normal. She exhibits no distension. There is no tenderness. There is no guarding.  Musculoskeletal: Normal range of motion. She exhibits no edema.  Mild L thigh and posterior knee and calf tenderness   Neurological: She is alert and oriented to person, place, and time.  CN 2-12 intact. Strength 4/5 L leg (chronic per husband). Requires assistance to ambulate   Skin: Skin is warm.  Psychiatric: She has a normal mood and affect.  Nursing Green and vitals reviewed.    ED Treatments / Results  Labs (all labs ordered are listed, but only abnormal  results are displayed) Labs Reviewed  COMPREHENSIVE METABOLIC PANEL - Abnormal; Notable for the following:       Result Value   Potassium 3.4 (*)    All other components within normal limits  CBC - Abnormal; Notable for the following:    RBC 5.17 (*)    Hemoglobin 15.4 (*)    All other components within normal limits  URINALYSIS, ROUTINE W REFLEX MICROSCOPIC - Abnormal; Notable for the following:    APPearance HAZY (*)    Nitrite POSITIVE (*)    Bacteria, UA RARE (*)    Squamous Epithelial / LPF 0-5 (*)    All other components within normal limits  URINE CULTURE  AMMONIA  CBG MONITORING, ED  I-STAT TROPONIN, ED    EKG  EKG Interpretation  Date/Time:  Sunday January 19 2017 18:53:57 EDT Ventricular Rate:  63 PR Interval:    QRS Duration: 113 QT Interval:  460 QTC Calculation: 471 R Axis:   -34 Text Interpretation:  Sinus rhythm Borderline prolonged PR interval Incomplete RBBB and LAFB Probable left ventricular hypertrophy No significant change since last tracing Confirmed by Richardean Canal [phone]) on 01/19/2017 7:07:30 PM       Radiology Ct Angio Head W Or Wo Contrast  Result Date:  01/19/2017 CLINICAL DATA:  New onset headache and confusion. Bilateral leg pain and weakness. Left basal ganglia hemorrhage 6 months ago. EXAM: CT ANGIOGRAPHY HEAD AND NECK TECHNIQUE: Multidetector CT imaging of the head and neck was performed using the standard protocol during bolus administration of intravenous contrast. Multiplanar CT image reconstructions and MIPs were obtained to evaluate the vascular anatomy. Carotid stenosis measurements (when applicable) are obtained utilizing NASCET criteria, using the distal internal carotid diameter as the denominator. CONTRAST:  100 mL Isovue 370 COMPARISON:  CT head without contrast 01/11/2017. FINDINGS: CT HEAD FINDINGS Brain: Scattered subcortical white matter hypoattenuation is similar to the prior study. Previous left lentiform nucleus infarct is noted. The basal ganglia are otherwise stable. No acute infarct, hemorrhage, or mass lesion is present. The ventricles are of normal size. The brainstem and cerebellum are normal. No significant extra-axial fluid collection is present. There is no subarachnoid hemorrhage. Vascular: Atherosclerotic calcifications are present within the cavernous internal carotid artery is. There is no hyperdense vessel. Skull: Calvarium is intact. No focal lytic or blastic lesions are present. Sinuses: A small fluid level is present within the right sphenoid sinus. The remaining paranasal sinuses and the mastoid air cells are clear. Orbits: Globes and orbits are within normal limits. Review of the MIP images confirms the above findings CTA NECK FINDINGS Aortic arch: A 3 vessel arch configuration is present. Atherosclerotic calcifications are present at the a large and great vessel origins without significant stenosis or aneurysm. Right carotid system: The right common carotid artery is tortuous. There is no focal stenosis. Atherosclerotic calcifications are present at the right carotid bifurcation without a significant stenosis relative to  the more distal vessel. There is moderate tortuosity just below the skullbase without significant stenosis. Left carotid system: The left common carotid artery is tortuous proximally. Atherosclerotic calcifications are present at the carotid bifurcation without a significant stenosis relative to the more distal vessel. More mild tortuosity is present in the cervical left ICA. Vertebral arteries: The left vertebral artery is the dominant vessel. Both vertebral arteries originate from the subclavian artery is without significant stenosis. There is no significant stenosis of either vertebral artery in the neck. Skeleton: Degenerative endplate changes are present C5-6 and C6-7  with osseous foraminal narrowing bilaterally at C5-6. Vertebral body heights and alignment are maintained. No focal lytic or blastic lesions are present. Other neck: The soft tissues the neck are otherwise unremarkable. Salivary glands are within limits. The thyroid is unremarkable. No significant adenopathy is present. Upper chest: The lung apices are clear. The superior mediastinum is clear. Review of the MIP images confirms the above findings CTA HEAD FINDINGS Anterior circulation: The right internal carotid artery demonstrates minimal atherosclerotic change within the cavernous segment. Is otherwise normal through the ICA terminus. The left internal carotid artery also demonstrates minimal cavernous calcification without significant stenosis through the ICA terminus. The A1 and M1 segments are normal. The anterior communicating artery is patent. The MCA bifurcations are intact. ACA and MCA branch vessels are within normal limits. Posterior circulation: The left vertebral artery is the dominant vessel. PICA origins are visualized and normal. The vertebrobasilar junction is normal. Both posterior cerebral artery is originate from the basilar tip. PCA branch vessels are within normal limits. Venous sinuses: The dural sinuses are patent. The right  transverse sinus is dominant. Straight sinus and deep cerebral veins are intact. Cortical veins are unremarkable. Anatomic variants: None Delayed phase: Postcontrast images demonstrate no pathologic enhancement. Review of the MIP images confirms the above findings IMPRESSION: 1. No acute intracranial abnormality. 2. Remote infarct of the left basal ganglia. 3. Moderate diffuse white matter disease. This likely reflects the sequela of chronic microvascular ischemia. 4. Atherosclerotic changes at the aortic arch, carotid bifurcations, and cavernous internal carotid artery is without significant stenosis. 5. Moderate tortuosity of the cervical right internal carotid artery just below the skullbase without significant stenosis. 6. Tortuosity in the proximal left vertebral artery and bilateral common carotid artery is. This is likely related to hypertension. 7. Degenerative changes of the cervical spine are most pronounced at C5-6. Electronically Signed   By: Marin Roberts M.D.   On: 01/19/2017 21:32   Dg Chest 2 View  Result Date: 01/19/2017 CLINICAL DATA:  Bilateral lower extremity pain. Confusion. Duration 1 week. EXAM: CHEST  2 VIEW COMPARISON:  08/15/2016 FINDINGS: The heart size and mediastinal contours are within normal limits. Both lungs are clear. The visualized skeletal structures are unremarkable. IMPRESSION: No active cardiopulmonary disease. Electronically Signed   By: Ellery Plunk M.D.   On: 01/19/2017 18:47   Ct Angio Neck W And/or Wo Contrast  Result Date: 01/19/2017 CLINICAL DATA:  New onset headache and confusion. Bilateral leg pain and weakness. Left basal ganglia hemorrhage 6 months ago. EXAM: CT ANGIOGRAPHY HEAD AND NECK TECHNIQUE: Multidetector CT imaging of the head and neck was performed using the standard protocol during bolus administration of intravenous contrast. Multiplanar CT image reconstructions and MIPs were obtained to evaluate the vascular anatomy. Carotid stenosis  measurements (when applicable) are obtained utilizing NASCET criteria, using the distal internal carotid diameter as the denominator. CONTRAST:  100 mL Isovue 370 COMPARISON:  CT head without contrast 01/11/2017. FINDINGS: CT HEAD FINDINGS Brain: Scattered subcortical white matter hypoattenuation is similar to the prior study. Previous left lentiform nucleus infarct is noted. The basal ganglia are otherwise stable. No acute infarct, hemorrhage, or mass lesion is present. The ventricles are of normal size. The brainstem and cerebellum are normal. No significant extra-axial fluid collection is present. There is no subarachnoid hemorrhage. Vascular: Atherosclerotic calcifications are present within the cavernous internal carotid artery is. There is no hyperdense vessel. Skull: Calvarium is intact. No focal lytic or blastic lesions are present. Sinuses: A small fluid  level is present within the right sphenoid sinus. The remaining paranasal sinuses and the mastoid air cells are clear. Orbits: Globes and orbits are within normal limits. Review of the MIP images confirms the above findings CTA NECK FINDINGS Aortic arch: A 3 vessel arch configuration is present. Atherosclerotic calcifications are present at the a large and great vessel origins without significant stenosis or aneurysm. Right carotid system: The right common carotid artery is tortuous. There is no focal stenosis. Atherosclerotic calcifications are present at the right carotid bifurcation without a significant stenosis relative to the more distal vessel. There is moderate tortuosity just below the skullbase without significant stenosis. Left carotid system: The left common carotid artery is tortuous proximally. Atherosclerotic calcifications are present at the carotid bifurcation without a significant stenosis relative to the more distal vessel. More mild tortuosity is present in the cervical left ICA. Vertebral arteries: The left vertebral artery is the  dominant vessel. Both vertebral arteries originate from the subclavian artery is without significant stenosis. There is no significant stenosis of either vertebral artery in the neck. Skeleton: Degenerative endplate changes are present C5-6 and C6-7 with osseous foraminal narrowing bilaterally at C5-6. Vertebral body heights and alignment are maintained. No focal lytic or blastic lesions are present. Other neck: The soft tissues the neck are otherwise unremarkable. Salivary glands are within limits. The thyroid is unremarkable. No significant adenopathy is present. Upper chest: The lung apices are clear. The superior mediastinum is clear. Review of the MIP images confirms the above findings CTA HEAD FINDINGS Anterior circulation: The right internal carotid artery demonstrates minimal atherosclerotic change within the cavernous segment. Is otherwise normal through the ICA terminus. The left internal carotid artery also demonstrates minimal cavernous calcification without significant stenosis through the ICA terminus. The A1 and M1 segments are normal. The anterior communicating artery is patent. The MCA bifurcations are intact. ACA and MCA branch vessels are within normal limits. Posterior circulation: The left vertebral artery is the dominant vessel. PICA origins are visualized and normal. The vertebrobasilar junction is normal. Both posterior cerebral artery is originate from the basilar tip. PCA branch vessels are within normal limits. Venous sinuses: The dural sinuses are patent. The right transverse sinus is dominant. Straight sinus and deep cerebral veins are intact. Cortical veins are unremarkable. Anatomic variants: None Delayed phase: Postcontrast images demonstrate no pathologic enhancement. Review of the MIP images confirms the above findings IMPRESSION: 1. No acute intracranial abnormality. 2. Remote infarct of the left basal ganglia. 3. Moderate diffuse white matter disease. This likely reflects the  sequela of chronic microvascular ischemia. 4. Atherosclerotic changes at the aortic arch, carotid bifurcations, and cavernous internal carotid artery is without significant stenosis. 5. Moderate tortuosity of the cervical right internal carotid artery just below the skullbase without significant stenosis. 6. Tortuosity in the proximal left vertebral artery and bilateral common carotid artery is. This is likely related to hypertension. 7. Degenerative changes of the cervical spine are most pronounced at C5-6. Electronically Signed   By: Marin Roberts M.D.   On: 01/19/2017 21:32    Procedures Procedures (including critical care time)  Medications Ordered in ED Medications  lisinopril (PRINIVIL,ZESTRIL) tablet 5 mg (not administered)  metoCLOPramide (REGLAN) injection 10 mg (10 mg Intravenous Given 01/19/17 1926)  diphenhydrAMINE (BENADRYL) injection 25 mg (25 mg Intravenous Given 01/19/17 1927)  sodium chloride 0.9 % bolus 1,000 mL (0 mLs Intravenous Stopped 01/19/17 2025)  iopamidol (ISOVUE-370) 76 % injection (100 mLs Intravenous Contrast Given 01/19/17 2048)  rOPINIRole (REQUIP)  tablet 2 mg (2 mg Oral Given 01/19/17 2023)     Initial Impression / Assessment and Plan / ED Course  I have reviewed the triage vital signs and the nursing notes.  Pertinent labs & imaging results that were available during my care of the patient were reviewed by me and considered in my medical decision making (see chart for details).    Heidi Green is a 61 y.o. female here with headaches, intermittent confusion. She is A & O x 3 currently. Was seen at ED a week ago and was thought to be multifactorial- HTN, gabapentin, possible sleep apnea. Husband states that headache is new today. Patient has unremarkable neuro exam currently. Has previous hemorrhagic stroke. Will get CTA head/neck to r/o SAH vs hemorrhage. Will get labs, UA, CXR. Will give migraine cocktail   9:59 PM Patient comfortably sleeping. CTA  unremarkable. Labs unremarkable. UA showed + nitrate but no leuk and has no symptoms so will not need abx. BP slightly elevated and husband states that her lisinopril was increased to 5 mg but she hasn't been taking it. Will give a dose of lisinopril before she goes home. I think the headaches and confusion likely multifactorial- hypertension, sleep apnea, and medications. She has follow up with PCP tomorrow who can order sleep study outpatient.    Final Clinical Impressions(s) / ED Diagnoses   Final diagnoses:  None    New Prescriptions New Prescriptions   No medications on file     Charlynne Pander, MD 01/19/17 2201

## 2017-01-19 NOTE — ED Notes (Signed)
Patient transported to CT 

## 2017-01-19 NOTE — ED Notes (Signed)
Patient transported to X-ray 

## 2017-01-19 NOTE — Progress Notes (Signed)
VASCULAR LAB PRELIMINARY  PRELIMINARY  PRELIMINARY  PRELIMINARY  Left lower extremity venous duplex completed.    Preliminary report:  There is no DVT or SVT noted in the left lower extremity.  Gave results to Dr. Lacie Draft, [facility], RVT 01/19/2017, 7:48 PM

## 2017-01-19 NOTE — ED Notes (Signed)
Pt had stroke in April. Pt is slightly confused at times, and pt has weakness in left leg. Pts stroke in April was right sided. Pt denies SOB, and chest pain.

## 2017-01-21 LAB — URINE CULTURE

## 2017-02-07 ENCOUNTER — Ambulatory Visit: Payer: Medicare HMO | Admitting: Diagnostic Neuroimaging

## 2017-07-05 ENCOUNTER — Other Ambulatory Visit: Payer: Self-pay

## 2017-07-05 ENCOUNTER — Encounter (HOSPITAL_BASED_OUTPATIENT_CLINIC_OR_DEPARTMENT_OTHER): Payer: Self-pay | Admitting: Emergency Medicine

## 2017-07-05 ENCOUNTER — Emergency Department (HOSPITAL_BASED_OUTPATIENT_CLINIC_OR_DEPARTMENT_OTHER)
Admission: EM | Admit: 2017-07-05 | Discharge: 2017-07-05 | Disposition: A | Discharge status: Home / Self Care | Payer: Medicare HMO | Attending: Emergency Medicine | Admitting: Emergency Medicine

## 2017-07-05 ENCOUNTER — Emergency Department (HOSPITAL_BASED_OUTPATIENT_CLINIC_OR_DEPARTMENT_OTHER): Payer: Medicare HMO

## 2017-07-05 DIAGNOSIS — Z8673 Personal history of transient ischemic attack (TIA), and cerebral infarction without residual deficits: Secondary | ICD-10-CM | POA: Insufficient documentation

## 2017-07-05 DIAGNOSIS — I10 Essential (primary) hypertension: Secondary | ICD-10-CM | POA: Insufficient documentation

## 2017-07-05 DIAGNOSIS — I619 Nontraumatic intracerebral hemorrhage, unspecified: Secondary | ICD-10-CM | POA: Insufficient documentation

## 2017-07-05 DIAGNOSIS — R4701 Aphasia: Secondary | ICD-10-CM | POA: Diagnosis not present

## 2017-07-05 DIAGNOSIS — Z87891 Personal history of nicotine dependence: Secondary | ICD-10-CM | POA: Insufficient documentation

## 2017-07-05 DIAGNOSIS — Z79899 Other long term (current) drug therapy: Secondary | ICD-10-CM | POA: Diagnosis not present

## 2017-07-05 DIAGNOSIS — R479 Unspecified speech disturbances: Secondary | ICD-10-CM | POA: Diagnosis present

## 2017-07-05 DIAGNOSIS — R410 Disorientation, unspecified: Secondary | ICD-10-CM

## 2017-07-05 DIAGNOSIS — N3000 Acute cystitis without hematuria: Secondary | ICD-10-CM

## 2017-07-05 DIAGNOSIS — R2990 Unspecified symptoms and signs involving the nervous system: Secondary | ICD-10-CM

## 2017-07-05 LAB — COMPREHENSIVE METABOLIC PANEL
ALBUMIN: 4.2 g/dL (ref 3.5–5.0)
ALT: 22 U/L (ref 14–54)
ANION GAP: 10 (ref 5–15)
AST: 29 U/L (ref 15–41)
Alkaline Phosphatase: 86 U/L (ref 38–126)
BUN: 18 mg/dL (ref 6–20)
CALCIUM: 9.2 mg/dL (ref 8.9–10.3)
CHLORIDE: 106 mmol/L (ref 101–111)
CO2: 22 mmol/L (ref 22–32)
Creatinine, Ser: 0.77 mg/dL (ref 0.44–1.00)
GFR calc non Af Amer: >60 mL/min (ref >60)
Glucose, Bld: 88 mg/dL (ref 65–99)
POTASSIUM: 3.6 mmol/L (ref 3.5–5.1)
Sodium: 138 mmol/L (ref 135–145)
Total Bilirubin: 0.8 mg/dL (ref 0.3–1.2)
Total Protein: 7.7 g/dL (ref 6.5–8.1)

## 2017-07-05 LAB — CBC
HCT: 43 % (ref 36.0–46.0)
Hemoglobin: 14.4 g/dL (ref 12.0–15.0)
MCH: 29.1 pg (ref 26.0–34.0)
MCHC: 33.5 g/dL (ref 30.0–36.0)
MCV: 87 fL (ref 78.0–100.0)
Platelets: 216 10*3/uL (ref 150–400)
RBC: 4.94 MIL/uL (ref 3.87–5.11)
RDW: 13.8 % (ref 11.5–15.5)
WBC: 7.7 10*3/uL (ref 4.0–10.5)

## 2017-07-05 LAB — URINALYSIS, MICROSCOPIC (REFLEX): RBC / HPF: NONE SEEN RBC/hpf (ref 0–5)

## 2017-07-05 LAB — DIFFERENTIAL
BASOS PCT: 1 %
Basophils Absolute: 0 10*3/uL (ref 0.0–0.1)
EOS ABS: 0.1 10*3/uL (ref 0.0–0.7)
EOS PCT: 2 %
Lymphocytes Relative: 32 %
Lymphs Abs: 2.4 10*3/uL (ref 0.7–4.0)
MONO ABS: 0.7 10*3/uL (ref 0.1–1.0)
Monocytes Relative: 9 %
NEUTROS PCT: 58 %
Neutro Abs: 4.4 10*3/uL (ref 1.7–7.7)

## 2017-07-05 LAB — ETHANOL

## 2017-07-05 LAB — PROTIME-INR
INR: 1.07
Prothrombin Time: 13.8 seconds (ref 11.4–15.2)

## 2017-07-05 LAB — URINALYSIS, ROUTINE W REFLEX MICROSCOPIC
Bilirubin Urine: NEGATIVE
Glucose, UA: NEGATIVE mg/dL
Hgb urine dipstick: NEGATIVE
Ketones, ur: 15 mg/dL — AB
NITRITE: NEGATIVE
Protein, ur: NEGATIVE mg/dL
SPECIFIC GRAVITY, URINE: 1.015 (ref 1.005–1.030)
pH: 7 (ref 5.0–8.0)

## 2017-07-05 LAB — TROPONIN I: Troponin I: <0.03 ng/mL (ref <0.03)

## 2017-07-05 LAB — CBG MONITORING, ED: Glucose-Capillary: 98 mg/dL (ref 65–99)

## 2017-07-05 LAB — APTT: aPTT: 39 seconds — ABNORMAL HIGH (ref 24–36)

## 2017-07-05 MED ORDER — SULFAMETHOXAZOLE-TRIMETHOPRIM 800-160 MG PO TABS: 1.0000 | ORAL_TABLET | Freq: Two times a day (BID) | ORAL | 0 refills | Status: AC

## 2017-07-05 NOTE — ED Notes (Signed)
Pt assessed for IV access and lab draw. Pt has poor veins so no attempt made. Brett CanalesSteve, RT assessing pt at this time

## 2017-07-05 NOTE — Discharge Instructions (Signed)
You have been seen in the Emergency Department (ED) today for confusion. Your workup today suggests that you have a urinary tract infection (UTI). Your MRI was negative for stroke.   Please take your antibiotic as prescribed and over-the-counter pain medication (Tylenol or Motrin) as needed, but no more than recommended on the label instructions.  Drink PLENTY of fluids.  Call your regular doctor to schedule the next available appointment to follow up on today?s ED visit, or return immediately to the ED if your pain worsens, you have decreased urine production, develop fever, persistent vomiting, or other symptoms that concern you.

## 2017-07-05 NOTE — ED Notes (Signed)
NIH performed from patients BASELINE and NORMAL.

## 2017-07-05 NOTE — ED Notes (Signed)
Pt transported to CT at this time.

## 2017-07-05 NOTE — ED Provider Notes (Signed)
[facility] EMERGENCY DEPARTMENT Provider Note   CSN: [id number] Arrival date & time: 07/05/17  1202     History   Chief Complaint Chief Complaint  Patient presents with  . Aphasia    HPI Heidi Green is a 62 y.o. female.  HPI 62 year old Caucasian female past medical history significant for CVA, hypertension, arthritis, dyslipidemia, chronic pain on pain medication presents to the emergency department today for evaluation of speech difficulties.  Patient has a known left-sided CVA with right-sided deficits that include some a aphasia and right-sided residual weakness.  Husband at bedside states for the past 2 days patient has been having repetitive speech.  States that yesterday while she was in her room as she kept saying "no more medicine".  Husband states that patient's speech seems at her baseline.  Patient denies any new weakness, a aphasia, paresthesias.  Patient did state that few days ago she felt funny headed.  Patient states that she does not want to be admitted if she does not have to.  Husband is just concerned that she may have had another stroke.  Patient is followed by neurology.  She does report that she was recently started on Suboxone for chronic pain management by her chronic pain doctor 1 week ago.  Patient also takes hydrocodone for chronic pain.  Denies any associated vision changes, lightheadedness, dizziness, syncope.  She is wheelchair-bound at baseline from prior stroke.  Pt denies any fever, chill, ha, vision changes, lightheadedness, dizziness, congestion, neck pain, cp, sob, cough, abd pain, n/v/d, urinary symptoms, change in bowel habits, melena, hematochezia, lower extremity paresthesias.  Past Medical History:  Diagnosis Date  . Arthritis    in back  . Back pain   . Chest pain 06/16/2012   negative cath with normal LV function  . Headache(784.0)   . Hypertension   . Kidney stones   . Stroke [facility])     Patient Active Problem List   Diagnosis Date Noted  . Right shoulder pain 10/22/2016  . Hypertensive emergency 08/01/2016  . Dysphagia following nontraumatic intracerebral hemorrhage 08/01/2016  . Hypokalemia 08/01/2016  . Agitation 08/01/2016  . Non-compliance 08/01/2016  . Allergic rhinitis 08/01/2016  . 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 Class 2 obesity   . Acute encephalopathy 07/18/2016  . NSTEMI (non-ST elevated myocardial infarction) (HCC) 06/17/2012  . Chest pain 06/16/2012  . HTN (hypertension) 06/16/2012  . Dyslipidemia 06/16/2012  . Back pain 06/16/2012  . Tobacco abuse 06/16/2012    Past Surgical History:  Procedure Laterality Date  . ABDOMINAL HYSTERECTOMY    . BACK SURGERY     x 3  . LEFT HEART CATHETERIZATION WITH CORONARY ANGIOGRAM N/A 06/16/2012   Procedure: LEFT HEART CATHETERIZATION WITH CORONARY ANGIOGRAM;  Surgeon: Peter M Swaziland, MD;  Location: [facility] CATH LAB;  Service: Cardiovascular;  Laterality: N/A;  . LITHOTRIPSY    . TUBAL LIGATION       OB History   None      Home Medications    Prior to Admission medications   Medication Sig Start Date End Date Taking? Authorizing Provider  acetaminophen (TYLENOL) 325 MG tablet Take 2 tablets (650 mg total) by mouth every 4 (four) hours as needed for mild pain (or temp > 37.5 C (99.5 F)). Combination of acetaminophen in vicodin and tylenol should not exceed max dose of 4000mg  daily 08/01/16   Layne Benton, NP  atorvastatin (LIPITOR) 40 MG tablet Take 40  mg by mouth daily. 01/13/17   [provider]  diclofenac (VOLTAREN) 50 MG EC tablet TK 1 T PO BID 10/11/16   [provider]  DULoxetine (CYMBALTA) 30 MG capsule TK 1 C PO D 10/11/16   [provider]  fluticasone (FLONASE) 50 MCG/ACT nasal spray Place 2 sprays into both nostrils daily as needed for allergies.     [provider]  furosemide (LASIX) 20 MG tablet Take 20 mg by mouth daily.    [provider]  gabapentin (NEURONTIN) 100 MG capsule Take 100 mg by mouth daily.    [provider]  Hydrocodone-Acetaminophen 2.5-325 MG TABS Take 1 tablet by mouth every 8 (eight) hours as needed (pain).     [provider]  lisinopril (PRINIVIL,ZESTRIL) 5 MG tablet 5 mg daily. 01/13/17   [provider]  loratadine (CLARITIN) 10 MG tablet Take 10 mg by mouth daily as needed for allergies.     [provider]  Melatonin 5 MG TABS Take 5 mg by mouth daily as needed (sleep).     [provider]  Menthol, Topical Analgesic, (BIOFREEZE) 4 % GEL Apply 1 application topically every 8 (eight) hours as needed (pain). Apply to back    [provider]  naproxen (NAPROSYN) 375 MG tablet Take 1 tablet (375 mg total) by mouth 2 (two) times daily. With food Patient not taking: Reported on 01/19/2017 10/15/16   Palumbo, April, MD  Nutritional Supplements (NUTRITIONAL SUPPLEMENT PO) Take 1 each by mouth daily. Magic Cup    [provider]  potassium chloride (K-DUR,KLOR-CON) 10 MEQ tablet Take 10 mEq by mouth 2 (two) times daily.    [provider]  rOPINIRole (REQUIP) 2 MG tablet Take 2 mg by mouth 2 (two) times daily.     [provider]  senna-docusate (SENOKOT-S) 8.6-50 MG tablet Take 1 tablet by mouth 2 (two) times daily. Patient not taking: Reported on 01/15/2017 08/01/16   Layne BentonBiby, Sharon L, NP  Vitamin D, Ergocalciferol, (DRISDOL) 50000 units CAPS capsule Take 1 capsule by mouth once a week. 01/10/17   [provider]    Family History Family History  Problem Relation Age of Onset  . Emphysema Mother   . Diabetes Mother     Social History Social History   Tobacco Use  . Smoking status: Former Smoker    Packs/day: 0.50    Years: 26.00    Pack years: 13.00    Types: Cigarettes    Last attempt to quit: 06/17/2012    Years since quitting: 5.0  . Smokeless tobacco: Never Used  Substance Use Topics  . Alcohol use: No  . Drug  use: No     Allergies   Garamycin [gentamicin sulfate]; Other; Penicillins; and Norvasc [amlodipine besylate]   Review of Systems Review of Systems  All other systems reviewed and are negative.    Physical Exam Updated Vital Signs BP (!) 156/97   Pulse 77   Temp 98 F (36.7 C)   Resp 16   SpO2 95%   Physical Exam  Constitutional: She is oriented to person, place, and time. She appears well-developed and well-nourished.  Non-toxic appearance. No distress.  HENT:  Head: Normocephalic and atraumatic.  Nose: Nose normal.  Mouth/Throat: Oropharynx is clear and moist.  Eyes: Pupils are equal, round, and reactive to light. Conjunctivae and EOM are normal. Right eye exhibits no discharge. Left eye exhibits no discharge. No scleral icterus.  Neck: Normal range of motion.  Neck supple.  Cardiovascular: Normal rate, regular rhythm, normal heart sounds and intact distal pulses. Exam reveals no gallop and no friction rub.  No murmur heard. Pulmonary/Chest: Effort normal and breath sounds normal. No stridor. No respiratory distress. She has no wheezes. She has no rales. She exhibits no tenderness.  Abdominal: Soft. Bowel sounds are normal. There is no tenderness. There is no rebound and no guarding.  Musculoskeletal: Normal range of motion. She exhibits no tenderness.  Lymphadenopathy:    She has no cervical adenopathy.  Neurological: She is alert and oriented to person, place, and time.  The patient is alert, attentive, and oriented x 3. Speech is clear. Cranial nerve II-VII grossly intact. Negative pronator drift. Sensation intact. Strength 5/5 in upper and lower extremities the left side.  4 out of 5 on the right side.  This is baseline per patient and husband.  Reflexes 2+ and symmetric at biceps, triceps, knees, and ankles. Rapid alternating movement and fine finger movements intact.    Skin: Skin is warm and dry. Capillary refill takes less than 2 seconds. No pallor.  Psychiatric:  Her behavior is normal. Judgment and thought content normal.  Nursing note and vitals reviewed.    ED Treatments / Results  Labs (all labs ordered are listed, but only abnormal results are displayed) Labs Reviewed  APTT - Abnormal; Notable for the following components:      Result Value   aPTT 39 (*)    All other components within normal limits  ETHANOL  PROTIME-INR  CBC  DIFFERENTIAL  COMPREHENSIVE METABOLIC PANEL  TROPONIN I  RAPID URINE DRUG SCREEN, HOSP PERFORMED  URINALYSIS, ROUTINE W REFLEX MICROSCOPIC  CBG MONITORING, ED    EKG EKG Interpretation  Date/Time:  Saturday July 05 2017 12:10:50 EDT Ventricular Rate:  85 PR Interval:  192 QRS Duration: 94 QT Interval:  404 QTC Calculation: 480 R Axis:   -47 Text Interpretation:  Sinus rhythm with occasional Premature ventricular complexes Pulmonary disease pattern Incomplete right bundle branch block Left anterior fascicular block Septal infarct , age undetermined Abnormal ECG agree. no sig change from previous Confirmed by Arby Barrette [phone]) on 07/05/2017 1:44:16 PM   Radiology Ct Head Wo Contrast  Result Date: 07/05/2017 CLINICAL DATA:  Difficulty finding words for 2 days, question stroke, focal neural deficit of greater than 6 hours, history of prior stroke with residual speech problems, history hypertension EXAM: CT HEAD WITHOUT CONTRAST TECHNIQUE: Contiguous axial images were obtained from the base of the skull through the vertex without intravenous contrast. Sagittal and coronal MPR images reconstructed from axial data set. COMPARISON:  01/19/2017 FINDINGS: Brain: Generalized atrophy. Normal ventricular morphology. No midline shift or mass effect. Small vessel chronic ischemic changes of deep cerebral white matter. Probable small old lacunar infarct LEFT basal ganglia. No intracranial hemorrhage, mass lesion, evidence of acute infarction, or extra-axial fluid collection. Vascular: No hyperdense vessels Skull:  Intact Sinuses/Orbits: Clear Other: N/A IMPRESSION: Atrophy with small vessel chronic ischemic changes of deep cerebral white matter. Probable small old lacunar infarct LEFT basal ganglia. No acute intracranial abnormalities. Electronically Signed   By: Ulyses Southward M.D.   On: 07/05/2017 13:40    Procedures Procedures (including critical care time)  Medications Ordered in ED Medications - No data to display   Initial Impression / Assessment and Plan / ED Course  I have reviewed the triage vital signs and the nursing notes.  Pertinent labs & imaging results that were available during my care of the patient  were reviewed by me and considered in my medical decision making (see chart for details).     Patient presents to the emergency department today for evaluation of aphasia.  Husband describes this as repetitive speech.  Patient denies any associated change in her speech.  She does have a history of left-sided CVA with right-sided deficits and some mild aphasia at baseline.  Patient was also started on Suboxone recently for chronic pain is also taking hydrocodone. On exam patient overall well-appearing and nontoxic.  Vital signs are reassuring.  Patient is afebrile.  Hypertension noted with history of same.   Neurologically patient has some right-sided weakness however this seems to be baseline per patient and husband.  Patient speech seems clear to me.  She is alert and oriented x3.  Heart regular rate and rhythm.  Lungs clear to auscultation bilaterally.  No focal abdominal tenderness.  Smile symmetric.  Cranial nerves intact.  Romberg and gait deferred due to patient being in wheelchair.  She has symptoms of ongoing for 2 days.  No code stroke initiated.  CT scan was obtained that showed no acute findings.  I did discuss with Dr. Elon Spanner neurology.  He felt like patient get an MRI and if MRI is normal she can likely have outpatient follow-up.  If MRI shows abnormality patient will need to be  admitted for stroke workup.  Lab work has been reassuring.  No leukocytosis.  Normal liver enzymes.  Normal kidney function.  UA and UDS pending at this time.  Troponin was negative.  Coags mildly elevated.  EKG appears at patient's baseline from prior tracing.  RI was ordered and is pending at this time.  I suspect that patient symptoms may be a combination of medications with starting the Suboxone and her hydrocodone.  Patient has no what I would think new focal neuro deficits on my exam today.  She is overall well-appearing and nontoxic.  Discussed admission versus discharge with patient and husband.  Patient would prefer to be discharged and husband thinks the patient needs to be admitted.  Will obtain MRI results and rediscuss with neurology and have shared decision-making with patient.  If MRI is normal patient can likely be discharged home.  She is currently on a statin.  Patient is not currently on Plavix or aspirin however will need to be followed up closely with her neurologist for this.  Care handoff to Dr. Jacqulyn Bath. Pt has pending at this time mri results.  Disposition likely home pending lab and test results. Care dicussed and plan agreed upon with oncoming MD. Pt updated on plan of care and is currently hemodynamically stable at this time with normal vs.     Final Clinical Impressions(s) / ED Diagnoses   Final diagnoses:  Aphasia    ED Discharge Orders    None       Wallace Keller 07/05/17 1709    Maia Plan, MD 07/06/17 1153

## 2017-07-05 NOTE — ED Triage Notes (Signed)
Pt states "my husband thinks I have had a stroke". Pt appears to have difficulty finding all of her words. Pt states it has been going on for 2 days. Pt with previous stroke with residual speech problems and R sided weakness.

## 2017-07-05 NOTE — ED Notes (Signed)
Dr Long at bedside

## 2017-07-05 NOTE — ED Notes (Signed)
ED Provider at bedside. Dr. Long 

## 2017-07-05 NOTE — ED Notes (Signed)
ED Provider at bedside. 

## 2017-07-05 NOTE — ED Notes (Signed)
Patient transported to MRI 

## 2017-07-05 NOTE — ED Notes (Addendum)
ED Provider at bedside. Will draw labs when his assessment is complete

## 2017-07-05 NOTE — ED Notes (Signed)
Pt has her home medications. Per Azucena Kubayler Leaphart, PA okay for her to self administer

## 2017-07-05 NOTE — ED Notes (Signed)
PA at bedside.

## 2017-07-10 ENCOUNTER — Emergency Department (HOSPITAL_BASED_OUTPATIENT_CLINIC_OR_DEPARTMENT_OTHER)
Admission: EM | Admit: 2017-07-10 | Discharge: 2017-07-10 | Disposition: A | Discharge status: Home / Self Care | Payer: Medicare HMO | Attending: Emergency Medicine | Admitting: Emergency Medicine

## 2017-07-10 ENCOUNTER — Other Ambulatory Visit: Payer: Self-pay

## 2017-07-10 ENCOUNTER — Encounter (HOSPITAL_BASED_OUTPATIENT_CLINIC_OR_DEPARTMENT_OTHER): Payer: Self-pay | Admitting: *Deleted

## 2017-07-10 DIAGNOSIS — Z79899 Other long term (current) drug therapy: Secondary | ICD-10-CM | POA: Diagnosis not present

## 2017-07-10 DIAGNOSIS — R7989 Other specified abnormal findings of blood chemistry: Secondary | ICD-10-CM | POA: Diagnosis not present

## 2017-07-10 DIAGNOSIS — Z8673 Personal history of transient ischemic attack (TIA), and cerebral infarction without residual deficits: Secondary | ICD-10-CM | POA: Insufficient documentation

## 2017-07-10 DIAGNOSIS — R531 Weakness: Secondary | ICD-10-CM | POA: Insufficient documentation

## 2017-07-10 DIAGNOSIS — I10 Essential (primary) hypertension: Secondary | ICD-10-CM | POA: Diagnosis not present

## 2017-07-10 DIAGNOSIS — Z87891 Personal history of nicotine dependence: Secondary | ICD-10-CM | POA: Insufficient documentation

## 2017-07-10 DIAGNOSIS — Z09 Encounter for follow-up examination after completed treatment for conditions other than malignant neoplasm: Secondary | ICD-10-CM

## 2017-07-10 LAB — COMPREHENSIVE METABOLIC PANEL
ALT: 19 U/L (ref 14–54)
AST: 22 U/L (ref 15–41)
Albumin: 4.1 g/dL (ref 3.5–5.0)
Alkaline Phosphatase: 89 U/L (ref 38–126)
Anion gap: 7 (ref 5–15)
BUN: 22 mg/dL — ABNORMAL HIGH (ref 6–20)
CO2: 23 mmol/L (ref 22–32)
Calcium: 8.7 mg/dL — ABNORMAL LOW (ref 8.9–10.3)
Chloride: 107 mmol/L (ref 101–111)
Creatinine, Ser: 0.99 mg/dL (ref 0.44–1.00)
GFR calc Af Amer: >60 mL/min (ref >60)
GFR calc non Af Amer: 60 mL/min — ABNORMAL LOW (ref >60)
Glucose, Bld: 78 mg/dL (ref 65–99)
Potassium: 4.4 mmol/L (ref 3.5–5.1)
Sodium: 137 mmol/L (ref 135–145)
Total Bilirubin: 0.5 mg/dL (ref 0.3–1.2)
Total Protein: 7.2 g/dL (ref 6.5–8.1)

## 2017-07-10 LAB — CBC WITH DIFFERENTIAL/PLATELET
Basophils Absolute: 0 10*3/uL (ref 0.0–0.1)
Basophils Relative: 1 %
Eosinophils Absolute: 0.3 10*3/uL (ref 0.0–0.7)
Eosinophils Relative: 5 %
HCT: 42.7 % (ref 36.0–46.0)
Hemoglobin: 13.9 g/dL (ref 12.0–15.0)
Lymphocytes Relative: 29 %
Lymphs Abs: 1.8 10*3/uL (ref 0.7–4.0)
MCH: 28.9 pg (ref 26.0–34.0)
MCHC: 32.6 g/dL (ref 30.0–36.0)
MCV: 88.8 fL (ref 78.0–100.0)
Monocytes Absolute: 0.7 10*3/uL (ref 0.1–1.0)
Monocytes Relative: 11 %
Neutro Abs: 3.6 10*3/uL (ref 1.7–7.7)
Neutrophils Relative %: 56 %
Platelets: 227 10*3/uL (ref 150–400)
RBC: 4.81 MIL/uL (ref 3.87–5.11)
RDW: 14.2 % (ref 11.5–15.5)
WBC: 6.5 10*3/uL (ref 4.0–10.5)

## 2017-07-10 LAB — URINALYSIS, ROUTINE W REFLEX MICROSCOPIC
Bilirubin Urine: NEGATIVE
Glucose, UA: NEGATIVE mg/dL
Hgb urine dipstick: NEGATIVE
Ketones, ur: NEGATIVE mg/dL
Leukocytes, UA: NEGATIVE
Nitrite: NEGATIVE
Protein, ur: NEGATIVE mg/dL
Specific Gravity, Urine: >1.03 — ABNORMAL HIGH (ref 1.005–1.030)
pH: 6 (ref 5.0–8.0)

## 2017-07-10 MED ORDER — SODIUM CHLORIDE 0.9 % IV BOLUS
500.0000 mL | Freq: Once | INTRAVENOUS | Status: AC
  Administered 2017-07-10: 500 mL via INTRAVENOUS

## 2017-07-10 NOTE — ED Provider Notes (Signed)
[facility] EMERGENCY DEPARTMENT Provider Note   CSN: [id number] Arrival date & time: 07/10/17  1647     History   Chief Complaint Chief Complaint  Patient presents with  . Weakness    HPI Heidi Green is a 62 y.o. female with history of stroke, hypertension who was sent by her doctor for abnormal lab.  Patient was evaluated 5 days ago for stroke was found to have a UTI and just had increase of her pain medication.  She has been treated for her UTI and has decreased the pain medication and her confusion has resolved.  Her urine has been clear and without odor.  She has been feeling decently well.  She followed up with her doctor 2 days ago and had labs drawn and was called today and told that she had a GFR of 28 and a creatinine of 1.9.  She was told to proceed here for evaluation.  Patient denies any new symptoms.  She denies any chest pain, shortness of breath, abdominal pain, nausea, vomiting.  She is currently taking Vicodin for her pain and is holding off on the new medication until she sees her pain doctor in 1 week.  HPI  Past Medical History:  Diagnosis Date  . Arthritis    in back  . Back pain   . Chest pain 06/16/2012   negative cath with normal LV function  . Headache(784.0)   . Hypertension   . Kidney stones   . Stroke [facility])     Patient Active Problem List   Diagnosis Date Noted  . Right shoulder pain 10/22/2016  . Hypertensive emergency 08/01/2016  . Dysphagia following nontraumatic intracerebral hemorrhage 08/01/2016  . Hypokalemia 08/01/2016  . Agitation 08/01/2016  . Non-compliance 08/01/2016  . Allergic rhinitis 08/01/2016  . 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 Class 2 obesity   . Acute encephalopathy 07/18/2016  . NSTEMI (non-ST elevated myocardial infarction) (HCC) 06/17/2012  . Chest pain 06/16/2012  . HTN (hypertension) 06/16/2012  . Dyslipidemia 06/16/2012  . Back pain 06/16/2012  . Tobacco  abuse 06/16/2012    Past Surgical History:  Procedure Laterality Date  . ABDOMINAL HYSTERECTOMY    . BACK SURGERY     x 3  . LEFT HEART CATHETERIZATION WITH CORONARY ANGIOGRAM N/A 06/16/2012   Procedure: LEFT HEART CATHETERIZATION WITH CORONARY ANGIOGRAM;  Surgeon: Peter M Swaziland, MD;  Location[facility] CATH LAB;  Service: Cardiovascular;  Laterality: N/A;  . LITHOTRIPSY    . TUBAL LIGATION       OB History   None      Home Medications    Prior to Admission medications   Medication Sig Start Date End Date Taking? Authorizing Provider  atorvastatin (LIPITOR) 40 MG tablet Take 40 mg by mouth daily. 01/13/17  Yes [provider]  diclofenac (VOLTAREN) 50 MG EC tablet TK 1 T PO BID 10/11/16  Yes [provider]  DULoxetine (CYMBALTA) 30 MG capsule TK 1 C PO D 10/11/16  Yes [provider]  lisinopril (PRINIVIL,ZESTRIL) 5 MG tablet 5 mg daily. 01/13/17  Yes [provider]  Menthol, Topical Analgesic, (BIOFREEZE) 4 % GEL Apply 1 application topically every 8 (eight) hours as needed (pain). Apply to back   Yes [provider]  rOPINIRole (REQUIP) 2 MG tablet Take 2 mg by mouth 2 (two) times daily.    Yes [provider]  sulfamethoxazole-trimethoprim (BACTRIM DS,SEPTRA DS) 800-160 MG tablet Take  1 tablet by mouth 2 (two) times daily for 7 days. 07/05/17 07/12/17 Yes Long, Arlyss Repress, MD  acetaminophen (TYLENOL) 325 MG tablet Take 2 tablets (650 mg total) by mouth every 4 (four) hours as needed for mild pain (or temp > 37.5 C (99.5 F)). Combination of acetaminophen in vicodin and tylenol should not exceed max dose of 4000mg  daily 08/01/16   Layne Benton, NP  fluticasone (FLONASE) 50 MCG/ACT nasal spray Place 2 sprays into both nostrils daily as needed for allergies.     [provider]  furosemide (LASIX) 20 MG tablet Take 20 mg by mouth daily.    [provider]  gabapentin (NEURONTIN) 100 MG capsule Take 100 mg by mouth daily.     [provider]  Hydrocodone-Acetaminophen 2.5-325 MG TABS Take 1 tablet by mouth every 8 (eight) hours as needed (pain).     [provider]  loratadine (CLARITIN) 10 MG tablet Take 10 mg by mouth daily as needed for allergies.     [provider]  Melatonin 5 MG TABS Take 5 mg by mouth daily as needed (sleep).     [provider]  naproxen (NAPROSYN) 375 MG tablet Take 1 tablet (375 mg total) by mouth 2 (two) times daily. With food Patient not taking: Reported on 01/19/2017 10/15/16   Palumbo, April, MD  Nutritional Supplements (NUTRITIONAL SUPPLEMENT PO) Take 1 each by mouth daily. Magic Cup    [provider]  potassium chloride (K-DUR,KLOR-CON) 10 MEQ tablet Take 10 mEq by mouth 2 (two) times daily.    [provider]  senna-docusate (SENOKOT-S) 8.6-50 MG tablet Take 1 tablet by mouth 2 (two) times daily. Patient not taking: Reported on 01/15/2017 08/01/16   Layne Benton, NP  Vitamin D, Ergocalciferol, (DRISDOL) 50000 units CAPS capsule Take 1 capsule by mouth once a week. 01/10/17   [provider]    Family History Family History  Problem Relation Age of Onset  . Emphysema Mother   . Diabetes Mother     Social History Social History   Tobacco Use  . Smoking status: Former Smoker    Packs/day: 0.50    Years: 26.00    Pack years: 13.00    Types: Cigarettes    Last attempt to quit: 06/17/2012    Years since quitting: 5.0  . Smokeless tobacco: Never Used  Substance Use Topics  . Alcohol use: No  . Drug use: No     Allergies   Garamycin [gentamicin sulfate]; Other; Penicillins; and Norvasc [amlodipine besylate]   Review of Systems Review of Systems  Constitutional: Negative for chills and fever.  HENT: Negative for facial swelling and sore throat.   Respiratory: Negative for shortness of breath.   Cardiovascular: Negative for chest pain.  Gastrointestinal: Negative for abdominal pain, nausea and vomiting.    Genitourinary: Negative for dysuria.  Musculoskeletal: Positive for back pain (chronic).  Skin: Negative for rash and wound.  Neurological: Negative for headaches.  Psychiatric/Behavioral: Negative for confusion. The patient is not nervous/anxious.      Physical Exam Updated Vital Signs BP 122/73 (BP Location: Right Arm)   Pulse 70   Temp 98.5 F (36.9 C) (Oral)   Resp 18   Ht 5\' 8"  (1.727 m)   Wt 99.8 kg (220 lb)   SpO2 96%   BMI 33.45 kg/m   Physical Exam  Constitutional: She appears well-developed and well-nourished. No distress.  Obese  HENT:  Head: Normocephalic and atraumatic.  Mouth/Throat:  Oropharynx is clear and moist. No oropharyngeal exudate.  Eyes: Pupils are equal, round, and reactive to light. Conjunctivae are normal. Right eye exhibits no discharge. Left eye exhibits no discharge. No scleral icterus.  Neck: Normal range of motion. Neck supple. No thyromegaly present.  Cardiovascular: Normal rate, regular rhythm, normal heart sounds and intact distal pulses. Exam reveals no gallop and no friction rub.  No murmur heard. Pulmonary/Chest: Effort normal and breath sounds normal. No stridor. No respiratory distress. She has no wheezes. She has no rales.  Abdominal: Soft. Bowel sounds are normal. She exhibits no distension. There is no tenderness. There is no rebound and no guarding.  Musculoskeletal: She exhibits no edema.  Lymphadenopathy:    She has no cervical adenopathy.  Neurological: She is alert. Coordination normal.  Skin: Skin is warm and dry. No rash noted. She is not diaphoretic. No pallor.  Psychiatric: She has a normal mood and affect.  Nursing note and vitals reviewed.    ED Treatments / Results  Labs (all labs ordered are listed, but only abnormal results are displayed) Labs Reviewed  COMPREHENSIVE METABOLIC PANEL - Abnormal; Notable for the following components:      Result Value   BUN 22 (*)    Calcium 8.7 (*)    GFR calc non Af Amer 60  (*)    All other components within normal limits  URINALYSIS, ROUTINE W REFLEX MICROSCOPIC - Abnormal; Notable for the following components:   Specific Gravity, Urine >1.030 (*)    All other components within normal limits  CBC WITH DIFFERENTIAL/PLATELET    EKG None  Radiology No results found.  Procedures Procedures (including critical care time)  Medications Ordered in ED Medications  sodium chloride 0.9 % bolus 500 mL (0 mLs Intravenous Stopped 07/10/17 1853)     Initial Impression / Assessment and Plan / ED Course  I have reviewed the triage vital signs and the nursing notes.  Pertinent labs & imaging results that were available during my care of the patient were reviewed by me and considered in my medical decision making (see chart for details).     Patient presenting after PCP told her to report for a creatinine of 1.9 and GFR of 28.  Patient's labs here are within normal limits.  Creatinine 0.99, GFR 60.  Patient has been feeling much better after her visit 5 days ago.  Her urine has become more clear and without odor.  UA is negative today.  Will advise patient to follow-up with PCP as needed, however no signs of kidney injury today.  Suspect lab error.  Return precautions discussed.  Patient understands and agrees with plan.  Patient vitals stable throughout ED course and discharged in satisfactory condition.  Patient also evaluated by Dr. Jacqulyn BathLong who guided the patient's management and agrees with plan.  Final Clinical Impressions(s) / ED Diagnoses   Final diagnoses:  Follow up    ED Discharge Orders    None       Verdis PrimeLaw, Lakaya Tolen M, PA-C 07/10/17 1901    Maia PlanLong, Joshua G, MD 07/11/17 1246

## 2017-07-10 NOTE — ED Notes (Signed)
ED Provider at bedside. 

## 2017-07-10 NOTE — ED Triage Notes (Signed)
GFR is 28.41 was told to come to ER, previous history of a stroke.

## 2017-07-10 NOTE — Discharge Instructions (Addendum)
Please follow up with your doctor as needed. Please return to the emergency department if you develop any new or worsening symptoms.

## 2017-10-04 ENCOUNTER — Emergency Department (HOSPITAL_COMMUNITY): Payer: Medicare HMO

## 2017-10-04 ENCOUNTER — Observation Stay (HOSPITAL_COMMUNITY)
Admission: EM | Admit: 2017-10-04 | Discharge: 2017-10-05 | Disposition: A | Discharge status: Home / Self Care | Payer: Medicare HMO | Attending: Student in an Organized Health Care Education/Training Program | Admitting: Student in an Organized Health Care Education/Training Program

## 2017-10-04 ENCOUNTER — Other Ambulatory Visit: Payer: Self-pay

## 2017-10-04 ENCOUNTER — Encounter (HOSPITAL_COMMUNITY): Payer: Self-pay

## 2017-10-04 DIAGNOSIS — E669 Obesity, unspecified: Secondary | ICD-10-CM | POA: Diagnosis not present

## 2017-10-04 DIAGNOSIS — Z8673 Personal history of transient ischemic attack (TIA), and cerebral infarction without residual deficits: Secondary | ICD-10-CM | POA: Insufficient documentation

## 2017-10-04 DIAGNOSIS — Z79899 Other long term (current) drug therapy: Secondary | ICD-10-CM | POA: Diagnosis not present

## 2017-10-04 DIAGNOSIS — Z880 Allergy status to penicillin: Secondary | ICD-10-CM | POA: Diagnosis not present

## 2017-10-04 DIAGNOSIS — J181 Lobar pneumonia, unspecified organism: Principal | ICD-10-CM | POA: Insufficient documentation

## 2017-10-04 DIAGNOSIS — Z6836 Body mass index (BMI) 36.0-36.9, adult: Secondary | ICD-10-CM | POA: Insufficient documentation

## 2017-10-04 DIAGNOSIS — Z888 Allergy status to other drugs, medicaments and biological substances status: Secondary | ICD-10-CM | POA: Insufficient documentation

## 2017-10-04 DIAGNOSIS — R531 Weakness: Secondary | ICD-10-CM | POA: Diagnosis present

## 2017-10-04 DIAGNOSIS — R0902 Hypoxemia: Secondary | ICD-10-CM

## 2017-10-04 DIAGNOSIS — R1310 Dysphagia, unspecified: Secondary | ICD-10-CM | POA: Insufficient documentation

## 2017-10-04 DIAGNOSIS — Z87891 Personal history of nicotine dependence: Secondary | ICD-10-CM | POA: Insufficient documentation

## 2017-10-04 DIAGNOSIS — M4800 Spinal stenosis, site unspecified: Secondary | ICD-10-CM | POA: Diagnosis not present

## 2017-10-04 DIAGNOSIS — G8191 Hemiplegia, unspecified affecting right dominant side: Secondary | ICD-10-CM | POA: Insufficient documentation

## 2017-10-04 DIAGNOSIS — I10 Essential (primary) hypertension: Secondary | ICD-10-CM | POA: Insufficient documentation

## 2017-10-04 DIAGNOSIS — E785 Hyperlipidemia, unspecified: Secondary | ICD-10-CM | POA: Diagnosis not present

## 2017-10-04 DIAGNOSIS — R509 Fever, unspecified: Secondary | ICD-10-CM

## 2017-10-04 DIAGNOSIS — J189 Pneumonia, unspecified organism: Secondary | ICD-10-CM | POA: Diagnosis present

## 2017-10-04 DIAGNOSIS — Z87442 Personal history of urinary calculi: Secondary | ICD-10-CM | POA: Insufficient documentation

## 2017-10-04 DIAGNOSIS — I252 Old myocardial infarction: Secondary | ICD-10-CM | POA: Insufficient documentation

## 2017-10-04 HISTORY — DX: Nontraumatic intracerebral hemorrhage, unspecified: I61.9

## 2017-10-04 LAB — CBG MONITORING, ED: Glucose-Capillary: 94 mg/dL (ref 65–99)

## 2017-10-04 LAB — PROTIME-INR
INR: 1.15
PROTHROMBIN TIME: 14.6 s (ref 11.4–15.2)

## 2017-10-04 LAB — CBC WITH DIFFERENTIAL/PLATELET
Abs Immature Granulocytes: 0.1 10*3/uL (ref 0.0–0.1)
BASOS PCT: 1 %
Basophils Absolute: 0.1 10*3/uL (ref 0.0–0.1)
EOS ABS: 0 10*3/uL (ref 0.0–0.7)
Eosinophils Relative: 0 %
HCT: 41.1 % (ref 36.0–46.0)
Hemoglobin: 13.1 g/dL (ref 12.0–15.0)
IMMATURE GRANULOCYTES: 1 %
LYMPHS ABS: 1.8 10*3/uL (ref 0.7–4.0)
Lymphocytes Relative: 12 %
MCH: 27.6 pg (ref 26.0–34.0)
MCHC: 31.9 g/dL (ref 30.0–36.0)
MCV: 86.5 fL (ref 78.0–100.0)
Monocytes Absolute: 0.6 10*3/uL (ref 0.1–1.0)
Monocytes Relative: 4 %
NEUTROS ABS: 11.7 10*3/uL — AB (ref 1.7–7.7)
NEUTROS PCT: 82 %
PLATELETS: 267 10*3/uL (ref 150–400)
RBC: 4.75 MIL/uL (ref 3.87–5.11)
RDW: 13.5 % (ref 11.5–15.5)
WBC: 14.3 10*3/uL — ABNORMAL HIGH (ref 4.0–10.5)

## 2017-10-04 LAB — BASIC METABOLIC PANEL
ANION GAP: 8 (ref 5–15)
BUN: 18 mg/dL (ref 6–20)
CALCIUM: 8.8 mg/dL — AB (ref 8.9–10.3)
CO2: 23 mmol/L (ref 22–32)
Chloride: 109 mmol/L (ref 101–111)
Creatinine, Ser: 0.81 mg/dL (ref 0.44–1.00)
GLUCOSE: 103 mg/dL — AB (ref 65–99)
POTASSIUM: 3.4 mmol/L — AB (ref 3.5–5.1)
SODIUM: 140 mmol/L (ref 135–145)

## 2017-10-04 LAB — URINALYSIS, ROUTINE W REFLEX MICROSCOPIC
BILIRUBIN URINE: NEGATIVE
GLUCOSE, UA: NEGATIVE mg/dL
KETONES UR: NEGATIVE mg/dL
LEUKOCYTES UA: NEGATIVE
Nitrite: NEGATIVE
PH: 6 (ref 5.0–8.0)
Protein, ur: NEGATIVE mg/dL
Specific Gravity, Urine: 1.017 (ref 1.005–1.030)

## 2017-10-04 LAB — I-STAT CG4 LACTIC ACID, ED
LACTIC ACID, VENOUS: 0.87 mmol/L (ref 0.5–1.9)
Lactic Acid, Venous: 1.34 mmol/L (ref 0.5–1.9)

## 2017-10-04 MED ORDER — DICLOFENAC SODIUM 50 MG PO TBEC
50.0000 mg | DELAYED_RELEASE_TABLET | Freq: Two times a day (BID) | ORAL | Status: DC
  Administered 2017-10-04 – 2017-10-05 (×2): 50 mg via ORAL
  Filled 2017-10-04 (×2): qty 1

## 2017-10-04 MED ORDER — FLUTICASONE PROPIONATE 50 MCG/ACT NA SUSP
2.0000 | Freq: Every day | NASAL | Status: DC | PRN
  Filled 2017-10-04: qty 16

## 2017-10-04 MED ORDER — LISINOPRIL 5 MG PO TABS
5.0000 mg | ORAL_TABLET | Freq: Every day | ORAL | Status: DC
  Administered 2017-10-05: 5 mg via ORAL
  Filled 2017-10-04: qty 1

## 2017-10-04 MED ORDER — FUROSEMIDE 20 MG PO TABS
20.0000 mg | ORAL_TABLET | Freq: Every day | ORAL | Status: DC
  Administered 2017-10-05: 20 mg via ORAL
  Filled 2017-10-04: qty 1

## 2017-10-04 MED ORDER — SODIUM CHLORIDE 0.9 % IV SOLN
INTRAVENOUS | Status: AC
  Administered 2017-10-04: 18:00:00 via INTRAVENOUS

## 2017-10-04 MED ORDER — DULOXETINE HCL 30 MG PO CPEP
30.0000 mg | ORAL_CAPSULE | Freq: Every day | ORAL | Status: DC
  Administered 2017-10-04: 30 mg via ORAL
  Filled 2017-10-04 (×2): qty 1

## 2017-10-04 MED ORDER — ACETAMINOPHEN 325 MG PO TABS: 650.0000 mg | ORAL_TABLET | Freq: Four times a day (QID) | ORAL | Status: DC | PRN

## 2017-10-04 MED ORDER — ATORVASTATIN CALCIUM 40 MG PO TABS
40.0000 mg | ORAL_TABLET | Freq: Every day | ORAL | Status: DC
  Administered 2017-10-04: 40 mg via ORAL
  Filled 2017-10-04 (×2): qty 1

## 2017-10-04 MED ORDER — ROPINIROLE HCL 1 MG PO TABS
2.0000 mg | ORAL_TABLET | Freq: Two times a day (BID) | ORAL | Status: DC
Start: 2017-10-04 — End: 2017-10-05
  Administered 2017-10-04 – 2017-10-05 (×2): 2 mg via ORAL
  Filled 2017-10-04 (×2): qty 2

## 2017-10-04 MED ORDER — SODIUM CHLORIDE 0.9% FLUSH
3.0000 mL | Freq: Two times a day (BID) | INTRAVENOUS | Status: DC
  Administered 2017-10-04 – 2017-10-05 (×2): 3 mL via INTRAVENOUS

## 2017-10-04 MED ORDER — MELATONIN 5 MG PO TABS: 5.0000 mg | ORAL_TABLET | Freq: Every day | ORAL | Status: DC | PRN

## 2017-10-04 MED ORDER — LEVOFLOXACIN IN D5W 750 MG/150ML IV SOLN
750.0000 mg | Freq: Once | INTRAVENOUS | Status: AC
  Administered 2017-10-04: 750 mg via INTRAVENOUS
  Filled 2017-10-04: qty 150

## 2017-10-04 MED ORDER — LEVOFLOXACIN 750 MG PO TABS
750.0000 mg | ORAL_TABLET | Freq: Every day | ORAL | Status: DC
  Filled 2017-10-04: qty 1

## 2017-10-04 MED ORDER — LORATADINE 10 MG PO TABS: 10.0000 mg | ORAL_TABLET | Freq: Every day | ORAL | Status: DC | PRN

## 2017-10-04 MED ORDER — GABAPENTIN 100 MG PO CAPS: 100.0000 mg | ORAL_CAPSULE | Freq: Every day | ORAL | Status: DC

## 2017-10-04 MED ORDER — HYDROCODONE-ACETAMINOPHEN 7.5-325 MG PO TABS
1.0000 | ORAL_TABLET | Freq: Four times a day (QID) | ORAL | Status: DC | PRN
  Administered 2017-10-04 – 2017-10-05 (×3): 1 via ORAL
  Filled 2017-10-04 (×3): qty 1

## 2017-10-04 MED ORDER — ACETAMINOPHEN 650 MG RE SUPP: 650.0000 mg | Freq: Four times a day (QID) | RECTAL | Status: DC | PRN

## 2017-10-04 MED ORDER — POLYETHYLENE GLYCOL 3350 17 G PO PACK: 17.0000 g | PACK | Freq: Every day | ORAL | Status: DC | PRN

## 2017-10-04 NOTE — Progress Notes (Signed)
Pharmacy Antibiotic Note  Heidi Green is a 62 y.o. female admitted on 10/04/2017 with pneumonia.  Pharmacy has been consulted for levaquin dosing.WBC 14.3 and patient afebrile. Scr < 1 and CrCl~ 90 ml/min.   Plan: Levofloxacin 750 mg every 24 hours for a total of 5 days Pharmacy will sign off and monitor peripherally  Monitor renal function and cultures   Height: 5\' 9"  (175.3 cm) Weight: 230 lb (104.3 kg) IBW/kg (Calculated) : 66.2  Temp (24hrs), Avg:99.9 F (37.7 C), Min:99.8 F (37.7 C), Max:99.9 F (37.7 C)  Recent Labs  Lab 10/04/17 1059 10/04/17 1109 10/04/17 1117 10/04/17 1338  WBC  --  14.3*  --   --   CREATININE 0.81  --   --   --   LATICACIDVEN  --   --  1.34 0.87    Estimated Creatinine Clearance: 92.5 mL/min (by C-G formula based on SCr of 0.81 mg/dL).    Allergies  Allergen Reactions  . Garamycin [Gentamicin Sulfate] Hives and Shortness Of Breath  . Other Anaphylaxis    HORSE SERUM  . Penicillins Anaphylaxis, Itching and Swelling    Has patient had a PCN reaction causing immediate rash, facial/tongue/throat swelling, SOB or lightheadedness with hypotension: Yes Has patient had a PCN reaction causing severe rash involving mucus membranes or skin necrosis: No Has patient had a PCN reaction that required hospitalization No Has patient had a PCN reaction occurring within the last 10 years: No If all of the above answers are "NO", then may proceed with Cephalosporin use.   . Norvasc [Amlodipine Besylate] Nausea And Vomiting    Antimicrobials this admission: Levaquin 6/22>    Microbiology results: 6/22 BCx: in process  6/22  UCx: in process    Thank you for allowing pharmacy to be a part of this patient's care.  Sharin MonsEmily Juaquin Ludington, PharmD, BCPS PGY2 Infectious Diseases Pharmacy Resident Phone: [phone] 10/04/2017 3:16 PM

## 2017-10-04 NOTE — ED Notes (Signed)
Patient transported to X-ray 

## 2017-10-04 NOTE — H&P (Signed)
Date: 10/04/2017               Patient Name:  Heidi Green MRN: [id number]  DOB: February 19, 1956 Age / Sex: 62 y.o., female   PCP: Karle Plumber, MD         Medical Service: Internal Medicine Teaching Service         Attending Physician: Dr. Oswaldo Done, Marquita Palms, *    First Contact: Dr. Evelene Croon  Pager: [phone]  Second Contact: Dr. 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 She was in her usual state of health until last night when she noticed she had a fever 101.1. She went to bed and woke this morning febrile with a T 101.1. She felt weak amd a strange sensation in her L face, but denies any other associated symptoms such as HA, dizziness, chest pain, shortness of breath, abdominal pain, or N/V. She does endorse a non-productive cough that is common at night when she lays flat and urinary urgency that has been present since her stroke in 2016. Denies sick contacts. In the ED she had a T of 99.9 but was otherwise HDS. She did desat to 86% and required supplemental oxygen. Her CXR showed a mild hazy linear opacity at RML and UA with many bacteria, and she was started on levofloxacin.   Meds:  Current Meds  Medication Sig  . diclofenac (VOLTAREN) 50 MG EC tablet TAKE 1 TABLET PO TWICE A DAY  . DULoxetine (CYMBALTA) 60 MG capsule TAKE 1 CAPSULE PO AT BEDITME  . esomeprazole (NEXIUM) 20 MG capsule Take 20 mg by mouth daily as needed (GERD).  Marland Kitchen HYDROcodone-acetaminophen (NORCO) 7.5-325 MG tablet Take 1 tablet by mouth every 6 (six) hours as needed (pain).   Marland Kitchen lisinopril (PRINIVIL,ZESTRIL) 20 MG tablet 20 mg daily.   Marland Kitchen LYRICA 150 MG  capsule Take 150 mg by mouth 2 (two) times daily.  . mirabegron ER (MYRBETRIQ) 25 MG TB24 tablet Take 25 mg by mouth at bedtime.  Marland Kitchen rOPINIRole (REQUIP) 2 MG tablet Take 2 mg by mouth 2 (two) times daily.      Allergies: Allergies as of 10/04/2017 - Review Complete 10/04/2017  Allergen Reaction Noted  . Garamycin [gentamicin sulfate] Hives and Shortness Of Breath 06/16/2012  . Other Anaphylaxis 08/16/2014  . Penicillins Anaphylaxis, Itching, and Swelling 06/25/2011  . Norvasc [amlodipine besylate] Nausea And Vomiting 06/30/2012   Past Medical History:  Diagnosis Date  . Arthritis    in back  . Back pain   . Chest pain 06/16/2012   negative cath with normal LV function  . Headache(784.0)   . Hypertension   . Kidney stones   . Stroke [facility])     Family History:  Family History  Problem Relation Age of Onset  . Emphysema Mother   . Diabetes Mother    Social History: Lives with husband at home who is care taker. Denies alcohol, tobacco, and drug use.   Review of Systems: A complete ROS was negative except as per HPI.   Physical Exam: Blood pressure 137/73, pulse 70,  temperature 97.6 F (36.4 C), temperature source Oral, resp. rate 16, height 5\' 9"  (1.753 m), weight 230 lb (104.3 kg), SpO2 98 %.  Physical Exam  Constitutional: She is oriented to person, place, and time.  Chronically ill appearing female sleeping in bed in no acute distress   HENT:  Head: Normocephalic and atraumatic.  Eyes: Conjunctivae are normal.  Neck: Normal range of motion. Neck supple.  Cardiovascular: Normal rate, regular rhythm and normal heart sounds. Exam reveals no gallop and no friction rub.  No murmur heard. Pulmonary/Chest:  Coarse breath sounds at R lower lung fields, no wheezes or crackles, no increased WOB on 2L  Abdominal: Soft. Bowel sounds are normal. She exhibits no distension. There is no tenderness.  Musculoskeletal: She exhibits edema (1-2+ bilateral pitting edema ).    Neurological: She is alert and oriented to person, place, and time.    EKG: personally reviewed my interpretation is NSR with one PVC noted and IVCD, proloned PR interval   CXR: personally reviewed my interpretation is RML opacity, no pleural effusions   Assessment & Plan by Problem: Principal Problem:   Fever  # CAP: Patient presented with high grade fever and new oxygen requirements with RML linear infiltrate on CXR. She does have coarse breath sounds on R lower lung field, but no wheezes or crackles. Currently on 2L of supplemental oxygen and oxygenating well.  Started on levofloxacin in the ED. Does have a PCN allergy causing anaphylaxis and has never been on a cephalosporin, therefore will continue levofloxacin. Would not treat for UTI at this time as she only reports urinary frequency that is chronic nature, but no other urinary symptoms.  - Continue levofloxacin  - Wean to room air as able  - PT/OT evaluation   # HTN:  - Resume home lisinopril and Lasix 6/23  # History of ICH: residual R hemiparesis. Head CT with no acute intracranial findings. On SCDs for VTE ppx.   # Spinal stenosis:  - Continue home Norco 7.5 mg q6h PRN and Cymbalta 30 mg QD  - PT/OT evaluation   F: NS @ 75 E: replete PRN  N: HH diet   VTE ppx: SCDs   Code status: Full code, confirmed on admission   Dispo: Admit patient to Observation with expected length of stay less than 2 midnights.  SignedBurna Cash: Santos-Sanchez, Mayson Sterbenz, MD 10/04/2017, 6:32 PM  Pager: [phone]

## 2017-10-04 NOTE — ED Notes (Signed)
Attempted report 

## 2017-10-04 NOTE — ED Provider Notes (Signed)
[name] EMERGENCY DEPARTMENT Provider Note   CSN: [id number] Arrival date & time: 10/04/17  1026     History   Chief Complaint Chief Complaint  Patient presents with  . Weakness    HPI Heidi Green is a 62 y.o. female.  HPI 62 year old female status post intracerebral hemorrhage in 2018 with residual right-sided weakness presents today with fever and possible increased weakness.  She states she went to bed last night at 9 PM.  She was in her usual neurological status at that time.  She woke at 4 AM and noted a fever.  She did not get out of bed and does not know what her status was at that time.  She went back to sleep at 8 AM she woke and continued to have a fever to 101.  She felt like she was somewhat weaker on the right side than baseline.  She is with chronic weakness of the right side and limited mobility secondary to this.  She and her husband think there may be some increased weakness to the right side but is not much beyond baseline.  She has no change in vision or ability to speak.  Has some cough and was noted to have a low sat of 89% on EMS arrival.  She is in the upper 90s to 100 on 3 L.  She also does report some change in urine color and odor.  She has not noted any wounds or skin problems.  She has not had any nausea, vomiting, diarrhea, or abdominal pain. Past Medical History:  Diagnosis Date  . Arthritis    in back  . Back pain   . Chest pain 06/16/2012   negative cath with normal LV function  . Headache(784.0)   . Hypertension   . Kidney stones   . Stroke [facility])     Patient Active Problem List   Diagnosis Date Noted  . Right shoulder pain 10/22/2016  . Hypertensive emergency 08/01/2016  . Dysphagia following nontraumatic intracerebral hemorrhage 08/01/2016  . Hypokalemia 08/01/2016  . Agitation 08/01/2016  . Non-compliance 08/01/2016  . Allergic rhinitis 08/01/2016  . ICH (intracerebral hemorrhage) (HCC) - L basal ganglie d/t the HTN  07/24/2016  . Class 2 obesity   . Acute encephalopathy 07/18/2016  . NSTEMI (non-ST elevated myocardial infarction) (HCC) 06/17/2012  . Chest pain 06/16/2012  . HTN (hypertension) 06/16/2012  . Dyslipidemia 06/16/2012  . Back pain 06/16/2012  . Tobacco abuse 06/16/2012    Past Surgical History:  Procedure Laterality Date  . ABDOMINAL HYSTERECTOMY    . BACK SURGERY     x 3  . LEFT HEART CATHETERIZATION WITH CORONARY ANGIOGRAM N/A 06/16/2012   Procedure: LEFT HEART CATHETERIZATION WITH CORONARY ANGIOGRAM;  Surgeon: Peter M Swazil[place], MD;  Location: [facility] CATH LAB;  Service: Cardiovascular;  Laterality: N/A;  . LITHOTRIPSY    . TUBAL LIGATION       OB History   None      Home Medications    Prior to Admission medications   Medication Sig Start Date End Date Taking? Authorizing Provider  acetaminophen (TYLENOL) 325 MG tablet Take 2 tablets (650 mg total) by mouth every 4 (four) hours as needed for mild pain (or temp > 37.5 C (99.5 F)). Combination of acetaminophen in vicodin and tylenol should not exceed max dose of 4000mg  daily 08/01/16   Layne Benton, NP  atorvastatin (LIPITOR) 40 MG tablet Take 40 mg by mouth daily. 01/13/17   [provider]  diclofenac (VOLTAREN) 50 MG EC tablet TK 1 T PO BID 10/11/16   [provider]  DULoxetine (CYMBALTA) 30 MG capsule TK 1 C PO D 10/11/16   [provider]  fluticasone (FLONASE) 50 MCG/ACT nasal spray Place 2 sprays into both nostrils daily as needed for allergies.     [provider]  furosemide (LASIX) 20 MG tablet Take 20 mg by mouth daily.    [provider]  gabapentin (NEURONTIN) 100 MG capsule Take 100 mg by mouth daily.    [provider]  Hydrocodone-Acetaminophen 2.5-325 MG TABS Take 1 tablet by mouth every 8 (eight) hours as needed (pain).     [provider]  lisinopril (PRINIVIL,ZESTRIL) 5 MG tablet 5 mg daily. 01/13/17   [provider]  loratadine (CLARITIN) 10  MG tablet Take 10 mg by mouth daily as needed for allergies.     [provider]  Melatonin 5 MG TABS Take 5 mg by mouth daily as needed (sleep).     [provider]  Menthol, Topical Analgesic, (BIOFREEZE) 4 % GEL Apply 1 application topically every 8 (eight) hours as needed (pain). Apply to back    [provider]  naproxen (NAPROSYN) 375 MG tablet Take 1 tablet (375 mg total) by mouth 2 (two) times daily. With food Patient not taking: Reported on 01/19/2017 10/15/16   Palumbo, April, MD  Nutritional Supplements (NUTRITIONAL SUPPLEMENT PO) Take 1 each by mouth daily. Magic Cup    [provider]  potassium chloride (K-DUR,KLOR-CON) 10 MEQ tablet Take 10 mEq by mouth 2 (two) times daily.    [provider]  rOPINIRole (REQUIP) 2 MG tablet Take 2 mg by mouth 2 (two) times daily.     [provider]  senna-docusate (SENOKOT-S) 8.6-50 MG tablet Take 1 tablet by mouth 2 (two) times daily. Patient not taking: Reported on 01/15/2017 08/01/16   Layne Benton, NP  Vitamin D, Ergocalciferol, (DRISDOL) 50000 units CAPS capsule Take 1 capsule by mouth once a week. 01/10/17   [provider]    Family History Family History  Problem Relation Age of Onset  . Emphysema Mother   . Diabetes Mother     Social History Social History   Tobacco Use  . Smoking status: Former Smoker    Packs/day: 0.50    Years: 26.00    Pack years: 13.00    Types: Cigarettes    Last attempt to quit: 06/17/2012    Years since quitting: 5.3  . Smokeless tobacco: Never Used  Substance Use Topics  . Alcohol use: No  . Drug use: No     Allergies   Garamycin [gentamicin sulfate]; Other; Penicillins; and Norvasc [amlodipine besylate]   Review of Systems Review of Systems  All other systems reviewed and are negative.    Physical Exam Updated Vital Signs BP 130/80 (BP Location: Left Arm)   Pulse 92   Temp 99.9 F (37.7 C) (Oral)   Resp 17   Ht 1.753 m  (5\' 9" )   Wt 104.3 kg (230 lb)   SpO2 95%   BMI 33.97 kg/m   Physical Exam  Constitutional: She is oriented to person, place, and time. She appears well-developed and well-nourished.  HENT:  Head: Normocephalic and atraumatic.  Right Ear: External ear normal.  Left Ear: External ear normal.  Nose: Nose normal.  Mouth/Throat: Oropharynx is clear and moist.  Eyes: Pupils are equal, round, and reactive to light. Conjunctivae and  EOM are normal.  Neck: Neck supple.  Cardiovascular: Normal rate, regular rhythm, normal heart sounds and intact distal pulses.  Pulmonary/Chest: Effort normal and breath sounds normal. No stridor. No respiratory distress. She has no wheezes. She has no rales.  Abdominal: Soft. Bowel sounds are normal. She exhibits no distension. There is no tenderness.  Musculoskeletal: Normal range of motion. She exhibits no edema.  Neurological: She is alert and oriented to person, place, and time.  She has right palmar drift Her right leg is unable to be flexed against gravity.  She is able to move her knee and ankle.  This appears to be baseline.  Skin: Skin is warm and dry. Capillary refill takes less than 2 seconds.  Psychiatric: She has a normal mood and affect. Her behavior is normal. Thought content normal.  Nursing note and vitals reviewed.    ED Treatments / Results  Labs (all labs ordered are listed, but only abnormal results are displayed) Labs Reviewed  CULTURE, BLOOD (ROUTINE X 2)  CULTURE, BLOOD (ROUTINE X 2)  BASIC METABOLIC PANEL  URINALYSIS, ROUTINE W REFLEX MICROSCOPIC  CBC WITH DIFFERENTIAL/PLATELET  PROTIME-INR  CBG MONITORING, ED  I-STAT CG4 LACTIC ACID, ED    EKG EKG Interpretation  Date/Time:  Saturday October 04 2017 10:33:29 EDT Ventricular Rate:  94 PR Interval:    QRS Duration: 105 QT Interval:  367 QTC Calculation: 459 R Axis:   -45 Text Interpretation:  Normal sinus rhythm Incomplete right bundle branch block Anteroseptal infarct  No significant change since last tracing Confirmed by Margarita Grizzleay, Alaiya Martindelcampo [phone]([id number]) on 10/04/2017 1:31:29 PM   Radiology Dg Chest 2 View  Result Date: 10/04/2017 CLINICAL DATA:  Weakness this morning. EXAM: CHEST - 2 VIEW COMPARISON:  01/19/2017 FINDINGS: Lungs are adequately inflated with mild hazy linear opacification over the right middle lobe which may be due to atelectasis or early infection. No evidence of effusion. Cardiomediastinal silhouette and remainder of the exam is unchanged. IMPRESSION: Mild hazy linear density over the right middle lobe which may be due to atelectasis or early infection. Electronically Signed   By: Elberta Fortisaniel  Boyle M.D.   On: 10/04/2017 12:45   Ct Head Wo Contrast  Result Date: 10/04/2017 CLINICAL DATA:  Increased right leg weakness this morning. Residual right-sided weakness and slurred speech from a stroke 1 year ago. EXAM: CT HEAD WITHOUT CONTRAST TECHNIQUE: Contiguous axial images were obtained from the base of the skull through the vertex without intravenous contrast. COMPARISON:  07/05/2017 FINDINGS: Brain: Ventricles, cisterns and other CSF spaces are within normal. Minimal chronic ischemic microvascular disease. Small old lacune infarct over the left lentiform nucleus/perisylvian region. Small lacune infarct over the right cerebellar hemisphere. No mass, mass effect, shift of midline structures or acute hemorrhage. No evidence of acute infarction. Vascular: No hyperdense vessel or unexpected calcification. Skull: Normal. Negative for fracture or focal lesion. Sinuses/Orbits: Orbits are normal. Paranasal sinuses are well developed with tiny air-fluid level over the sphenoid sinus. Other: None. IMPRESSION: No acute findings. Mild chronic ischemic microvascular disease and old lacunar infarcts as described. Electronically Signed   By: Elberta Fortisaniel  Boyle M.D.   On: 10/04/2017 13:12    Procedures Procedures (including critical care time)  Medications Ordered in ED Medications - No  data to display   Initial Impression / Assessment and Plan / ED Course  I have reviewed the triage vital signs and the nursing notes.  Pertinent labs & imaging results that were available during my care of the patient were reviewed  by me and considered in my medical decision making (see chart for details).    62 year old female history of hemorrhagic stroke presents today with fever and generalized weakness.  She had a new oxygen requirement per EMS and has been on oxygen here.  Chest x-Azul Brumett significant for possible early infiltrate on right.  Also urine, has Many bacteria.  Urine is being cultured.  She is covered here with Levaquin and given a history of anaphylaxis to penicillins.  Lactic acid is normal patient has been hemodynamically stable.  Plan admission for further evaluation and treatment.  Patient also reported with some increased weakness on the right.  Last known normal at 9 PM and presented here at 11 AM with 14 hours.  On exam patient appeared to have baseline weakness with may be some slight increase from baseline per her husband.  CT obtained showed no evidence of acute infarct I think this is all likely due to her infection. Discussed with Dr. Vincente Liberty, on-call for unassigned medicine and she will see for admission Final Clinical Impressions(s) / ED Diagnoses   Final diagnoses:  Febrile illness  Community acquired pneumonia of right middle lobe of lung (HCC)  Febrile illness, acute  Weakness    ED Discharge Orders    None       Margarita Grizzle, MD 10/04/17 1353

## 2017-10-04 NOTE — ED Notes (Signed)
purewick applied.

## 2017-10-04 NOTE — ED Notes (Addendum)
Pt endorsing worsening numbness in R leg; slight redness noted in L leg; pt not c/o pain in the area of redness at this time

## 2017-10-04 NOTE — ED Triage Notes (Signed)
Pt from home via [facility]; c/o weakness since 0400 this am; Hz stroke 1 year ago - R sided deficits; pt c/p increased weakness in R side - difficulty moving foot and increased slurred speech (slurred at baseline per pt's husband); pt's sats upper 80's to low 90's on RA upon EMS arrival - pt placed on 4 L   HR 102 CBG 93 98% 4L 146/70

## 2017-10-04 NOTE — ED Notes (Signed)
Admitting at bedside 

## 2017-10-04 NOTE — ED Notes (Signed)
Pt states LNW 2100 last evening

## 2017-10-04 NOTE — ED Notes (Signed)
In and out catheterization performed; pt tolerated well

## 2017-10-04 NOTE — Progress Notes (Signed)
Pt arrived by stretcher to unit with husband in no acute distress at this time

## 2017-10-05 ENCOUNTER — Encounter (HOSPITAL_COMMUNITY): Payer: Self-pay | Admitting: Student in an Organized Health Care Education/Training Program

## 2017-10-05 ENCOUNTER — Observation Stay (HOSPITAL_COMMUNITY): Payer: Medicare HMO

## 2017-10-05 DIAGNOSIS — Z79891 Long term (current) use of opiate analgesic: Secondary | ICD-10-CM | POA: Diagnosis not present

## 2017-10-05 DIAGNOSIS — Z880 Allergy status to penicillin: Secondary | ICD-10-CM

## 2017-10-05 DIAGNOSIS — I69251 Hemiplegia and hemiparesis following other nontraumatic intracranial hemorrhage affecting right dominant side: Secondary | ICD-10-CM

## 2017-10-05 DIAGNOSIS — J181 Lobar pneumonia, unspecified organism: Secondary | ICD-10-CM

## 2017-10-05 DIAGNOSIS — I10 Essential (primary) hypertension: Secondary | ICD-10-CM

## 2017-10-05 DIAGNOSIS — I69291 Dysphagia following other nontraumatic intracranial hemorrhage: Secondary | ICD-10-CM

## 2017-10-05 DIAGNOSIS — M4800 Spinal stenosis, site unspecified: Secondary | ICD-10-CM | POA: Diagnosis not present

## 2017-10-05 DIAGNOSIS — R1310 Dysphagia, unspecified: Secondary | ICD-10-CM | POA: Diagnosis not present

## 2017-10-05 LAB — RESPIRATORY PANEL BY PCR
Adenovirus: NOT DETECTED
Bordetella pertussis: NOT DETECTED
CORONAVIRUS HKU1-RVPPCR: NOT DETECTED
CORONAVIRUS OC43-RVPPCR: NOT DETECTED
Chlamydophila pneumoniae: NOT DETECTED
Coronavirus 229E: NOT DETECTED
Coronavirus NL63: NOT DETECTED
INFLUENZA B-RVPPCR: NOT DETECTED
Influenza A: NOT DETECTED
METAPNEUMOVIRUS-RVPPCR: NOT DETECTED
Mycoplasma pneumoniae: NOT DETECTED
PARAINFLUENZA VIRUS 1-RVPPCR: NOT DETECTED
PARAINFLUENZA VIRUS 2-RVPPCR: NOT DETECTED
PARAINFLUENZA VIRUS 3-RVPPCR: NOT DETECTED
Parainfluenza Virus 4: NOT DETECTED
RESPIRATORY SYNCYTIAL VIRUS-RVPPCR: NOT DETECTED
RHINOVIRUS / ENTEROVIRUS - RVPPCR: NOT DETECTED

## 2017-10-05 LAB — CBC
HEMATOCRIT: 38.8 % (ref 36.0–46.0)
Hemoglobin: 12.3 g/dL (ref 12.0–15.0)
MCH: 27.8 pg (ref 26.0–34.0)
MCHC: 31.7 g/dL (ref 30.0–36.0)
MCV: 87.6 fL (ref 78.0–100.0)
PLATELETS: 226 10*3/uL (ref 150–400)
RBC: 4.43 MIL/uL (ref 3.87–5.11)
RDW: 13.4 % (ref 11.5–15.5)
WBC: 9.1 10*3/uL (ref 4.0–10.5)

## 2017-10-05 LAB — BASIC METABOLIC PANEL
Anion gap: 7 (ref 5–15)
BUN: 15 mg/dL (ref 6–20)
CALCIUM: 8.6 mg/dL — AB (ref 8.9–10.3)
CO2: 22 mmol/L (ref 22–32)
CREATININE: 0.64 mg/dL (ref 0.44–1.00)
Chloride: 111 mmol/L (ref 101–111)
GFR calc Af Amer: >60 mL/min (ref >60)
GFR calc non Af Amer: >60 mL/min (ref >60)
GLUCOSE: 104 mg/dL — AB (ref 65–99)
Potassium: 3.3 mmol/L — ABNORMAL LOW (ref 3.5–5.1)
Sodium: 140 mmol/L (ref 135–145)

## 2017-10-05 LAB — HIV ANTIBODY (ROUTINE TESTING W REFLEX): HIV Screen 4th Generation wRfx: NONREACTIVE

## 2017-10-05 MED ORDER — POTASSIUM CHLORIDE CRYS ER 20 MEQ PO TBCR: 60.0000 meq | EXTENDED_RELEASE_TABLET | Freq: Once | ORAL | Status: DC

## 2017-10-05 MED ORDER — ATORVASTATIN CALCIUM 40 MG PO TABS: 40.0000 mg | ORAL_TABLET | Freq: Every day | ORAL | Status: DC

## 2017-10-05 MED ORDER — LEVOFLOXACIN 750 MG PO TABS: 750.0000 mg | ORAL_TABLET | Freq: Every day | ORAL | 0 refills | Status: DC

## 2017-10-05 NOTE — Progress Notes (Signed)
Bladder scanned patient with result of .  Patient was able to transfer to the bedside commode with urine output of .  Will notify MD.

## 2017-10-05 NOTE — Progress Notes (Signed)
   Subjective:  Bladder scan overnight showed 740mL, but patient able to void 450mL. However, no PVR documented. She is feeling better today and would like to go home. Denies any problems voiding this morning. Does not like the Purewick catheter and using bedside commode to void without difficulty.   Objective:  Vital signs in last 24 hours: Vitals:   10/04/17 1600 10/04/17 1640 10/04/17 2114 10/05/17 0618  BP: 130/67 137/73 131/76 140/77  Pulse: 70 70 74 81  Resp: (!) 24 16 20 20   Temp:  97.6 F (36.4 C) 98.7 F (37.1 C) 98.2 F (36.8 C)  TempSrc:  Oral    SpO2: 99% 98% 97% 96%  Weight:    244 lb 14.9 oz (111.1 kg)  Height:       Physical Exam  Constitutional: She is oriented to person, place, and time.  Chronically ill appearing female resting in bed in no acute distress   Cardiovascular: Normal rate, regular rhythm and normal heart sounds. Exam reveals no gallop and no friction rub.  No murmur heard. Pulmonary/Chest: Effort normal and breath sounds normal. No respiratory distress. She has no wheezes. She has no rales.  Abdominal: Soft. Bowel sounds are normal. She exhibits no distension. There is no tenderness.  Musculoskeletal: She exhibits edema (1+ LE pitting edema bilaterally ).  Neurological: She is alert and oriented to person, place, and time.    Assessment/Plan:  Principal Problem:   Fever  # CAP: Patient much improved this morning. Afebrile, hemodynamically stable, and has been weaned to room air. Her lungs are clear bilaterally. She is requesting to go home and is medically stable for discharge. Will check O2 sats on ambulation prior to discharge.  - Continue levofloxacin x 3 days  - Check O2 sats during ambulation   # HTN: Resume home lisinopril and Lasix   # History of ICH: residual R hemiparesis. On SCDs for VTE ppx.   # Spinal stenosis: Continue home Norco 7.5 mg q6h PRN and Cymbalta 30 mg QD.   Dispo: Anticipated discharge today.   Burna CashSantos-Sanchez,  Taisa Deloria, MD 10/05/2017, 10:35 AM Pager: [phone]

## 2017-10-05 NOTE — Discharge Summary (Signed)
Name: Heidi Green MRN: [id number] DOB: 12/16/1955 62 y.o. PCP: Karle Plumber, MD  Date of Admission: 10/04/2017 10:26 AM Date of Discharge: 10/05/2017 Attending Physician: Tyson Alias, *  Discharge Diagnosis: 1. Pneumonia   Discharge Medications: Allergies as of 10/05/2017      Reactions   Garamycin [gentamicin Sulfate] Hives, Shortness Of Breath   Other Anaphylaxis   HORSE SERUM   Penicillins Anaphylaxis, Itching, Swelling   Has patient had a PCN reaction causing immediate rash, facial/tongue/throat swelling, SOB or lightheadedness with hypotension: Yes Has patient had a PCN reaction causing severe rash involving mucus membranes or skin necrosis: No Has patient had a PCN reaction that required hospitalization No Has patient had a PCN reaction occurring within the last 10 years: No If all of the above answers are "NO", then may proceed with Cephalosporin use.   Norvasc [amlodipine Besylate] Nausea And Vomiting      Medication List    STOP taking these medications   naproxen 375 MG tablet Commonly known as:  NAPROSYN     TAKE these medications   acetaminophen 325 MG tablet Commonly known as:  TYLENOL Take 2 tablets (650 mg total) by mouth every 4 (four) hours as needed for mild pain (or temp > 37.5 C (99.5 F)). Combination of acetaminophen in vicodin and tylenol should not exceed max dose of 4000mg  daily   atorvastatin 40 MG tablet Commonly known as:  LIPITOR Take 40 mg by mouth daily.   BIOFREEZE 4 % Gel Generic drug:  Menthol (Topical Analgesic) Apply 1 application topically every 8 (eight) hours as needed (pain). Apply to back   diclofenac 50 MG EC tablet Commonly known as:  VOLTAREN TAKE 1 TABLET PO TWICE A DAY   DULoxetine 60 MG capsule Commonly known as:  CYMBALTA TAKE 1 CAPSULE PO AT BEDITME   esomeprazole 20 MG capsule Commonly known as:  NEXIUM Take 20 mg by mouth daily as needed (GERD).   fluticasone 50 MCG/ACT nasal spray Commonly  known as:  FLONASE Place 2 sprays into both nostrils daily as needed for allergies.   HYDROcodone-acetaminophen 7.5-325 MG tablet Commonly known as:  NORCO Take 1 tablet by mouth every 6 (six) hours as needed (pain).   levofloxacin 750 MG tablet Commonly known as:  LEVAQUIN Take 1 tablet (750 mg total) by mouth daily.   lisinopril 20 MG tablet Commonly known as:  PRINIVIL,ZESTRIL 20 mg daily.   LYRICA 150 MG capsule Generic drug:  pregabalin Take 150 mg by mouth 2 (two) times daily.   mirabegron ER 25 MG Tb24 tablet Commonly known as:  MYRBETRIQ Take 25 mg by mouth at bedtime.   rOPINIRole 2 MG tablet Commonly known as:  REQUIP Take 2 mg by mouth 2 (two) times daily.   senna-docusate 8.6-50 MG tablet Commonly known as:  Senokot-S Take 1 tablet by mouth 2 (two) times daily.       Disposition and follow-up:   [name] was discharged from [facility] in Stable condition.  At the hospital follow up visit please address:  1.  Please assess for ongoing fever or respiratory symptoms. Please ensure compliance with antibiotic therapy (levofloxacin x 4 days). Will need repeat CXR in 6 weeks to ensure resolution of pneumonia.   2.  Labs / imaging needed at time of follow-up: None   3.  Pending labs/ test needing follow-up: None  Follow-up Appointments: Follow-up Information    Karle Plumber, MD. Go to.  Specialty:  Internal Medicine Why:  Please keep your appointment with your regular doctor in 3 weeks. Contact information: [address] [place] [id number] [phone]           Hospital Course by problem list:  1. Community acquired pneumonia: Patient presented with a 1-day history of high grade fevers and new oxygen requirement, and was found to have a right middle lobe pneumonia. Blood cultures were negative. She was started on levofloxacin due to PCN allergy. On HD#1 she defervesce and was successfully weaned to room air without  complications. She was discharged home on levofloxacin x 4 days to complete a 5 day course. Will need repeat CXR in 6 weeks to ensure resolution of pneumonia.    Discharge Vitals:   BP 140/77 (BP Location: Left Arm)   Pulse 81   Temp 98.2 F (36.8 C)   Resp 20   Ht 5\' 9"  (1.753 m)   Wt 244 lb 14.9 oz (111.1 kg)   SpO2 96%   BMI 36.17 kg/m   Pertinent Labs, Studies, and Procedures:   CBC Latest Ref Rng & Units 10/05/2017 10/04/2017 07/10/2017  WBC 4.0 - 10.5 K/uL 9.1 14.3(H) 6.5  Hemoglobin 12.0 - 15.0 g/dL 46.912.3 62.913.1 52.813.9  Hematocrit 36.0 - 46.0 % 38.8 41.1 42.7  Platelets 150 - 400 K/uL 226 267 227    BMP Latest Ref Rng & Units 10/05/2017 10/04/2017 07/10/2017  Glucose 65 - 99 mg/dL 413(K104(H) 440(N103(H) 78  BUN 6 - 20 mg/dL 15 18 02(V22(H)  Creatinine 0.44 - 1.00 mg/dL 2.530.64 6.640.81 4.030.99  Sodium 135 - 145 mmol/L 140 140 137  Potassium 3.5 - 5.1 mmol/L 3.3(L) 3.4(L) 4.4  Chloride 101 - 111 mmol/L 111 109 107  CO2 22 - 32 mmol/L 22 23 23   Calcium 8.9 - 10.3 mg/dL 4.7(Q8.6(L) 2.5(Z8.8(L) 5.6(L8.7(L)   CXR 6/22: FINDINGS: Lungs are adequately inflated with mild hazy linear opacification over the right middle lobe which may be due to atelectasis or early infection. No evidence of effusion. Cardiomediastinal silhouette and remainder of the exam is unchanged.  IMPRESSION: Mild hazy linear density over the right middle lobe which may be due to atelectasis or early infection.   Discharge Instructions: Discharge Instructions    Call MD for:  difficulty breathing, headache or visual disturbances   Complete by:  As directed    Call MD for:  temperature >100.4   Complete by:  As directed    Diet - low sodium heart healthy   Complete by:  As directed    Discharge instructions   Complete by:  As directed    [name],   You were admitted to the hospital due to a high-grade fever.  This was caused by a pneumonia in your right lung.  For this she will have to take antibiotics for the next 4 days.  We have  prescribed you levofloxacin 750 mg.  Take 1 tablet every day for the next 4 days.  Please continue taking the rest of your home medications as usual.  Please make sure to follow-up with your regular doctor in 4 weeks and let her know that you are in the hospital recently for a pneumonia as you will need a repeat chest x-ray in 6 weeks.  Please call us if you have any questions.  - Dr. Evelene CroonSantos   Increase activity slowly   Complete by:  As directed       Signed: Burna CashSantos-Sanchez, Malanie Koloski, MD 10/05/2017, 11:09 AM  Pager: [phone]

## 2017-10-06 LAB — URINE CULTURE

## 2017-10-09 LAB — CULTURE, BLOOD (ROUTINE X 2)
Culture: NO GROWTH
Culture: NO GROWTH
Special Requests: ADEQUATE
Special Requests: ADEQUATE

## 2017-11-11 ENCOUNTER — Emergency Department (HOSPITAL_COMMUNITY): Payer: Medicare HMO

## 2017-11-11 ENCOUNTER — Encounter (HOSPITAL_COMMUNITY): Payer: Self-pay

## 2017-11-11 ENCOUNTER — Other Ambulatory Visit: Payer: Self-pay

## 2017-11-11 ENCOUNTER — Inpatient Hospital Stay (HOSPITAL_COMMUNITY)
Admission: EM | Admit: 2017-11-11 | Discharge: 2017-11-14 | DRG: 871 | Disposition: A | Discharge status: Home Health | Payer: Medicare HMO | Attending: Internal Medicine | Admitting: Internal Medicine

## 2017-11-11 DIAGNOSIS — Z87442 Personal history of urinary calculi: Secondary | ICD-10-CM | POA: Diagnosis not present

## 2017-11-11 DIAGNOSIS — N390 Urinary tract infection, site not specified: Secondary | ICD-10-CM | POA: Diagnosis present

## 2017-11-11 DIAGNOSIS — G894 Chronic pain syndrome: Secondary | ICD-10-CM | POA: Diagnosis present

## 2017-11-11 DIAGNOSIS — E872 Acidosis, unspecified: Secondary | ICD-10-CM

## 2017-11-11 DIAGNOSIS — I619 Nontraumatic intracerebral hemorrhage, unspecified: Secondary | ICD-10-CM | POA: Diagnosis present

## 2017-11-11 DIAGNOSIS — G4733 Obstructive sleep apnea (adult) (pediatric): Secondary | ICD-10-CM | POA: Diagnosis present

## 2017-11-11 DIAGNOSIS — A419 Sepsis, unspecified organism: Secondary | ICD-10-CM | POA: Diagnosis present

## 2017-11-11 DIAGNOSIS — R6520 Severe sepsis without septic shock: Secondary | ICD-10-CM | POA: Diagnosis present

## 2017-11-11 DIAGNOSIS — E66812 Obesity, class 2: Secondary | ICD-10-CM | POA: Diagnosis present

## 2017-11-11 DIAGNOSIS — R1312 Dysphagia, oropharyngeal phase: Secondary | ICD-10-CM | POA: Diagnosis present

## 2017-11-11 DIAGNOSIS — E669 Obesity, unspecified: Secondary | ICD-10-CM | POA: Diagnosis present

## 2017-11-11 DIAGNOSIS — Z7401 Bed confinement status: Secondary | ICD-10-CM

## 2017-11-11 DIAGNOSIS — Z79899 Other long term (current) drug therapy: Secondary | ICD-10-CM

## 2017-11-11 DIAGNOSIS — J690 Pneumonitis due to inhalation of food and vomit: Secondary | ICD-10-CM | POA: Diagnosis present

## 2017-11-11 DIAGNOSIS — Z880 Allergy status to penicillin: Secondary | ICD-10-CM

## 2017-11-11 DIAGNOSIS — I110 Hypertensive heart disease with heart failure: Secondary | ICD-10-CM | POA: Diagnosis present

## 2017-11-11 DIAGNOSIS — N179 Acute kidney failure, unspecified: Secondary | ICD-10-CM | POA: Diagnosis present

## 2017-11-11 DIAGNOSIS — Z87891 Personal history of nicotine dependence: Secondary | ICD-10-CM | POA: Diagnosis not present

## 2017-11-11 DIAGNOSIS — Z8673 Personal history of transient ischemic attack (TIA), and cerebral infarction without residual deficits: Secondary | ICD-10-CM | POA: Diagnosis not present

## 2017-11-11 DIAGNOSIS — R0902 Hypoxemia: Secondary | ICD-10-CM | POA: Diagnosis present

## 2017-11-11 DIAGNOSIS — Y95 Nosocomial condition: Secondary | ICD-10-CM | POA: Diagnosis present

## 2017-11-11 DIAGNOSIS — I10 Essential (primary) hypertension: Secondary | ICD-10-CM | POA: Diagnosis present

## 2017-11-11 DIAGNOSIS — Z993 Dependence on wheelchair: Secondary | ICD-10-CM

## 2017-11-11 DIAGNOSIS — Z6834 Body mass index (BMI) 34.0-34.9, adult: Secondary | ICD-10-CM | POA: Diagnosis not present

## 2017-11-11 DIAGNOSIS — Z888 Allergy status to other drugs, medicaments and biological substances status: Secondary | ICD-10-CM

## 2017-11-11 DIAGNOSIS — J189 Pneumonia, unspecified organism: Secondary | ICD-10-CM | POA: Diagnosis not present

## 2017-11-11 LAB — CBC WITH DIFFERENTIAL/PLATELET
BASOS PCT: 0 %
Basophils Absolute: 0 10*3/uL (ref 0.0–0.1)
Eosinophils Absolute: 0 10*3/uL (ref 0.0–0.7)
Eosinophils Relative: 0 %
HEMATOCRIT: 42.7 % (ref 36.0–46.0)
HEMOGLOBIN: 13.7 g/dL (ref 12.0–15.0)
LYMPHS ABS: 0.7 10*3/uL (ref 0.7–4.0)
LYMPHS PCT: 3 %
MCH: 27.6 pg (ref 26.0–34.0)
MCHC: 32.1 g/dL (ref 30.0–36.0)
MCV: 86.1 fL (ref 78.0–100.0)
MONOS PCT: 5 %
Monocytes Absolute: 1.1 10*3/uL — ABNORMAL HIGH (ref 0.1–1.0)
NEUTROS ABS: 23.3 10*3/uL — AB (ref 1.7–7.7)
NEUTROS PCT: 92 %
Platelets: 272 10*3/uL (ref 150–400)
RBC: 4.96 MIL/uL (ref 3.87–5.11)
RDW: 13.5 % (ref 11.5–15.5)
WBC: 25.2 10*3/uL — ABNORMAL HIGH (ref 4.0–10.5)

## 2017-11-11 LAB — CBC
HCT: 42 % (ref 36.0–46.0)
Hemoglobin: 13.3 g/dL (ref 12.0–15.0)
MCH: 27.5 pg (ref 26.0–34.0)
MCHC: 31.7 g/dL (ref 30.0–36.0)
MCV: 87 fL (ref 78.0–100.0)
Platelets: 272 10*3/uL (ref 150–400)
RBC: 4.83 MIL/uL (ref 3.87–5.11)
RDW: 13.7 % (ref 11.5–15.5)
WBC: 35.1 10*3/uL — AB (ref 4.0–10.5)

## 2017-11-11 LAB — COMPREHENSIVE METABOLIC PANEL
ALT: 24 U/L (ref 0–44)
ANION GAP: 12 (ref 5–15)
AST: 39 U/L (ref 15–41)
Albumin: 4 g/dL (ref 3.5–5.0)
Alkaline Phosphatase: 84 U/L (ref 38–126)
BUN: 22 mg/dL (ref 8–23)
CHLORIDE: 107 mmol/L (ref 98–111)
CO2: 23 mmol/L (ref 22–32)
Calcium: 9 mg/dL (ref 8.9–10.3)
Creatinine, Ser: 1.06 mg/dL — ABNORMAL HIGH (ref 0.44–1.00)
GFR calc non Af Amer: 55 mL/min — ABNORMAL LOW (ref >60)
Glucose, Bld: 122 mg/dL — ABNORMAL HIGH (ref 70–99)
POTASSIUM: 3.6 mmol/L (ref 3.5–5.1)
Sodium: 142 mmol/L (ref 135–145)
Total Bilirubin: 1.3 mg/dL — ABNORMAL HIGH (ref 0.3–1.2)
Total Protein: 7.8 g/dL (ref 6.5–8.1)

## 2017-11-11 LAB — URINALYSIS, ROUTINE W REFLEX MICROSCOPIC
BILIRUBIN URINE: NEGATIVE
Glucose, UA: NEGATIVE mg/dL
Hgb urine dipstick: NEGATIVE
Ketones, ur: NEGATIVE mg/dL
Leukocytes, UA: NEGATIVE
NITRITE: NEGATIVE
PH: 5 (ref 5.0–8.0)
Protein, ur: NEGATIVE mg/dL
SPECIFIC GRAVITY, URINE: 1.023 (ref 1.005–1.030)

## 2017-11-11 LAB — I-STAT CG4 LACTIC ACID, ED: LACTIC ACID, VENOUS: 2.12 mmol/L — AB (ref 0.5–1.9)

## 2017-11-11 LAB — CREATININE, SERUM
Creatinine, Ser: 0.98 mg/dL (ref 0.44–1.00)
GFR calc Af Amer: >60 mL/min (ref >60)
GFR calc non Af Amer: >60 mL/min (ref >60)

## 2017-11-11 LAB — LACTIC ACID, PLASMA: Lactic Acid, Venous: 2.5 mmol/L (ref 0.5–1.9)

## 2017-11-11 LAB — LIPASE, BLOOD: Lipase: 29 U/L (ref 11–51)

## 2017-11-11 LAB — MRSA PCR SCREENING: MRSA by PCR: POSITIVE — AB

## 2017-11-11 MED ORDER — ACETAMINOPHEN 650 MG RE SUPP: 650.0000 mg | Freq: Four times a day (QID) | RECTAL | Status: DC | PRN

## 2017-11-11 MED ORDER — IOPAMIDOL (ISOVUE-300) INJECTION 61%
100.0000 mL | Freq: Once | INTRAVENOUS | Status: AC | PRN
  Administered 2017-11-11: 100 mL via INTRAVENOUS

## 2017-11-11 MED ORDER — SODIUM CHLORIDE 0.45 % IV SOLN
INTRAVENOUS | Status: DC
  Administered 2017-11-11 – 2017-11-12 (×3): via INTRAVENOUS

## 2017-11-11 MED ORDER — ONDANSETRON HCL 4 MG/2ML IJ SOLN
4.0000 mg | Freq: Four times a day (QID) | INTRAMUSCULAR | Status: DC | PRN
  Administered 2017-11-12: 4 mg via INTRAVENOUS
  Filled 2017-11-11: qty 2

## 2017-11-11 MED ORDER — VANCOMYCIN HCL 10 G IV SOLR
1250.0000 mg | INTRAVENOUS | Status: DC
  Administered 2017-11-12: 1250 mg via INTRAVENOUS
  Filled 2017-11-11 (×2): qty 1250

## 2017-11-11 MED ORDER — VANCOMYCIN HCL IN DEXTROSE 1-5 GM/200ML-% IV SOLN: 1000.0000 mg | Freq: Once | INTRAVENOUS | Status: DC

## 2017-11-11 MED ORDER — ONDANSETRON HCL 4 MG PO TABS: 4.0000 mg | ORAL_TABLET | Freq: Four times a day (QID) | ORAL | Status: DC | PRN

## 2017-11-11 MED ORDER — ENOXAPARIN SODIUM 40 MG/0.4ML ~~LOC~~ SOLN
40.0000 mg | SUBCUTANEOUS | Status: DC
  Filled 2017-11-11: qty 0.4

## 2017-11-11 MED ORDER — LEVOFLOXACIN IN D5W 750 MG/150ML IV SOLN
750.0000 mg | INTRAVENOUS | Status: DC
  Administered 2017-11-12 – 2017-11-14 (×3): 750 mg via INTRAVENOUS
  Filled 2017-11-11 (×3): qty 150

## 2017-11-11 MED ORDER — MIRABEGRON ER 25 MG PO TB24
25.0000 mg | ORAL_TABLET | Freq: Every day | ORAL | Status: DC
  Administered 2017-11-11 – 2017-11-13 (×3): 25 mg via ORAL
  Filled 2017-11-11 (×3): qty 1

## 2017-11-11 MED ORDER — PANTOPRAZOLE SODIUM 40 MG PO TBEC
40.0000 mg | DELAYED_RELEASE_TABLET | Freq: Every day | ORAL | Status: DC
  Administered 2017-11-12 – 2017-11-14 (×3): 40 mg via ORAL
  Filled 2017-11-11 (×3): qty 1

## 2017-11-11 MED ORDER — ACETAMINOPHEN 325 MG PO TABS
650.0000 mg | ORAL_TABLET | Freq: Four times a day (QID) | ORAL | Status: DC | PRN
  Filled 2017-11-11: qty 2

## 2017-11-11 MED ORDER — CHLORHEXIDINE GLUCONATE CLOTH 2 % EX PADS
6.0000 | MEDICATED_PAD | Freq: Every day | CUTANEOUS | Status: DC
  Administered 2017-11-12 – 2017-11-14 (×3): 6 via TOPICAL

## 2017-11-11 MED ORDER — DULOXETINE HCL 30 MG PO CPEP
60.0000 mg | ORAL_CAPSULE | Freq: Every day | ORAL | Status: DC
  Administered 2017-11-12 – 2017-11-14 (×3): 60 mg via ORAL
  Filled 2017-11-11 (×4): qty 2

## 2017-11-11 MED ORDER — MUPIROCIN 2 % EX OINT
1.0000 "application " | TOPICAL_OINTMENT | Freq: Two times a day (BID) | CUTANEOUS | Status: DC
  Administered 2017-11-12 – 2017-11-14 (×5): 1 via NASAL
  Filled 2017-11-11: qty 22

## 2017-11-11 MED ORDER — VANCOMYCIN HCL 10 G IV SOLR
2000.0000 mg | Freq: Once | INTRAVENOUS | Status: AC
  Administered 2017-11-11: 2000 mg via INTRAVENOUS
  Filled 2017-11-11: qty 2000

## 2017-11-11 MED ORDER — IOPAMIDOL (ISOVUE-300) INJECTION 61%
INTRAVENOUS | Status: AC
  Filled 2017-11-11: qty 100

## 2017-11-11 MED ORDER — HYDROCODONE-ACETAMINOPHEN 7.5-325 MG PO TABS
1.0000 | ORAL_TABLET | Freq: Four times a day (QID) | ORAL | Status: DC | PRN
  Administered 2017-11-11 – 2017-11-14 (×6): 1 via ORAL
  Filled 2017-11-11 (×6): qty 1

## 2017-11-11 MED ORDER — ATORVASTATIN CALCIUM 40 MG PO TABS
40.0000 mg | ORAL_TABLET | Freq: Every day | ORAL | Status: DC
  Administered 2017-11-12 – 2017-11-14 (×3): 40 mg via ORAL
  Filled 2017-11-11 (×4): qty 1

## 2017-11-11 MED ORDER — LEVOFLOXACIN IN D5W 750 MG/150ML IV SOLN
750.0000 mg | Freq: Once | INTRAVENOUS | Status: AC
  Administered 2017-11-11: 750 mg via INTRAVENOUS
  Filled 2017-11-11: qty 150

## 2017-11-11 MED ORDER — AZTREONAM 2 G IJ SOLR
2.0000 g | Freq: Once | INTRAMUSCULAR | Status: AC
  Administered 2017-11-11: 2 g via INTRAVENOUS
  Filled 2017-11-11: qty 2

## 2017-11-11 MED ORDER — ROPINIROLE HCL 1 MG PO TABS
2.0000 mg | ORAL_TABLET | Freq: Two times a day (BID) | ORAL | Status: DC
  Administered 2017-11-11 – 2017-11-14 (×6): 2 mg via ORAL
  Filled 2017-11-11 (×6): qty 2

## 2017-11-11 MED ORDER — PREGABALIN 50 MG PO CAPS
150.0000 mg | ORAL_CAPSULE | Freq: Two times a day (BID) | ORAL | Status: DC
  Administered 2017-11-11 – 2017-11-14 (×6): 150 mg via ORAL
  Filled 2017-11-11 (×6): qty 3

## 2017-11-11 NOTE — ED Notes (Signed)
Bed: [id number] Expected date: 11/11/17 Expected time: 8:54 AM Means of arrival: Ambulance Comments:

## 2017-11-11 NOTE — H&P (Addendum)
History and Physical    Heidi Green  WJX:[id number]  DOB: [birth year]  DOA: 11/11/2017 PCP: Karle Plumber, MD   Patient coming from: home  Chief Complaint: fever  HPI: Heidi Green is a 62 y.o. female with medical history of HTN who was recently admitted to the hospital for CAP from 6/22-6/23 to the internal medicine teaching service for CAP (RML) and d/c'd with a total of 5 day course of Levaquin.  She returns due to fever of 102. She has been on Macrobid for a UTI. In the ED, she is found on imaging to have a RLL infiltrate. She has a mildly low  Pulse ox but has not had any respiratory symptoms. She does not choke on her food. No recent vomiting.   ED Course: WBC 25.2, Cr 1.06, LActic acid 2.12 CT chest/abd/pelvis:  1. Acute bronchopneumonia greatest in the dependent right lung. 2. Hepatic steatosis. 3. 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 4. Aortic atherosclerosis.  Review of Systems:  All other systems reviewed and apart from HPI, are negative.  Past Medical History:  Diagnosis Date  . Arthritis    in back  . Back pain   . Chest pain 06/16/2012   negative cath with normal LV function  . Headache(784.0)   . Hypertension   . 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 Kidney stones   . Stroke [facility])     Past Surgical History:  Procedure Laterality Date  . ABDOMINAL HYSTERECTOMY    . BACK SURGERY     x 3  . LEFT HEART CATHETERIZATION WITH CORONARY ANGIOGRAM N/A 06/16/2012   Procedure: LEFT HEART CATHETERIZATION WITH CORONARY ANGIOGRAM;  Surgeon: Peter M Swaz[place], MD;  Location: [facility] CATH LAB;  Service: Cardiovascular;  Laterality: N/A;  . LITHOTRIPSY    . TUBAL LIGATION      Social History:   reports that she quit smoking about 5 years ago. Her smoking use included cigarettes. She has a 13.00 pack-year smoking history. She has never used smokeless tobacco. She reports that she does  not drink alcohol or use drugs.  Allergies  Allergen Reactions  . Garamycin [Gentamicin Sulfate] Hives and Shortness Of Breath  . Other Anaphylaxis    HORSE SERUM  . Penicillins Anaphylaxis, Itching and Swelling    Has patient had a PCN reaction causing immediate rash, facial/tongue/throat swelling, SOB or lightheadedness with hypotension: Yes Has patient had a PCN reaction causing severe rash involving mucus membranes or skin necrosis: No Has patient had a PCN reaction that required hospitalization No Has patient had a PCN reaction occurring within the last 10 years: No If all of the above answers are "NO", then may proceed with Cephalosporin use.   Marland Kitchen Norvasc [Amlodipine Besylate] Nausea And Vomiting    Family History  Problem Relation Age of Onset  . Emphysema Mother   . Diabetes Mother      Prior to Admission medications   Medication Sig Start Date End Date Taking? Authorizing Provider  atorvastatin (LIPITOR) 40 MG tablet Take 40 mg by mouth daily. 01/13/17  Yes [provider]  diclofenac (VOLTAREN) 50 MG EC tablet TAKE 1 TABLET PO TWICE A DAY 10/11/16  Yes [provider]  DULoxetine (CYMBALTA) 60 MG capsule TAKE 1 CAPSULE PO AT BEDITME 10/11/16  Yes [provider]  esomeprazole (NEXIUM) 20 MG capsule Take 20 mg by mouth daily as needed (GERD).  Yes [provider]  HYDROcodone-acetaminophen (NORCO) 7.5-325 MG tablet Take 1 tablet by mouth every 6 (six) hours as needed (pain).    Yes [provider]  lisinopril (PRINIVIL,ZESTRIL) 20 MG tablet 20 mg daily.  01/13/17  Yes [provider]  LYRICA 150 MG capsule Take 150 mg by mouth 2 (two) times daily. 09/06/17  Yes [provider]  mirabegron ER (MYRBETRIQ) 25 MG TB24 tablet Take 25 mg by mouth at bedtime.   Yes [provider]  nitrofurantoin, macrocrystal-monohydrate, (MACROBID) 100 MG capsule Take 100 mg by mouth 2 (two) times daily. 11/08/17  Yes [provider]  rOPINIRole (REQUIP) 2 MG tablet Take 2 mg by mouth 2 (two) times daily.    Yes [provider]  Vitamin D, Ergocalciferol, (DRISDOL) 50000 units CAPS capsule Take 50,000 Units by mouth once a week. 10/31/17  Yes [provider]  acetaminophen (TYLENOL) 325 MG tablet Take 2 tablets (650 mg total) by mouth every 4 (four) hours as needed for mild pain (or temp > 37.5 C (99.5 F)). Combination of acetaminophen in vicodin and tylenol should not exceed max dose of 4000mg  daily Patient not taking: Reported on 11/11/2017 08/01/16   Layne BentonBiby, Sharon L, NP  levofloxacin (LEVAQUIN) 750 MG tablet Take 1 tablet (750 mg total) by mouth daily. Patient not taking: Reported on 11/11/2017 10/05/17   Burna CashSantos-Sanchez, Idalys, MD  senna-docusate (SENOKOT-S) 8.6-50 MG tablet Take 1 tablet by mouth 2 (two) times daily. Patient not taking: Reported on 01/15/2017 08/01/16   Layne BentonBiby, Sharon L, NP    Physical Exam: Wt Readings from Last 3 Encounters:  11/11/17 104.3 kg (230 lb)  10/05/17 111.1 kg (244 lb 14.9 oz)  07/10/17 99.8 kg (220 lb)   Vitals:   11/11/17 1430 11/11/17 1500 11/11/17 1530 11/11/17 1600  BP: 104/63 100/66 (!) 101/53 99/62  Pulse: 81 79 77 78  Resp: (!) 24 (!) 25 (!) 22 20  Temp:      TempSrc:      SpO2: 96% 93% 95% 90%  Weight:      Height:          Constitutional:  Calm & comfortable Eyes: PERRLA, lids and conjunctivae normal ENT:  Mucous membranes are moist.  Pharynx clear of exudate   Normal dentition.  Neck: Supple, no masses  Respiratory:  Crackles in RLL- on 2 L O2- pulse ox 97% Normal respiratory effort.  Cardiovascular:  S1 & S2 heard, regular rate and rhythm No Murmurs Abdomen:  Non distended No tenderness, No masses Bowel sounds normal Extremities:  No clubbing / cyanosis No pedal edema No joint deformity    Skin:  No rashes, lesions or ulcers Neurologic:  AAO x 3 CN 2-12 grossly intact Sensation intact Strength 5/5 in all 4  extremities Psychiatric:  Normal Mood and affect    Labs on Admission: I have personally reviewed following labs and imaging studies  CBC: Recent Labs  Lab 11/11/17 1028  WBC 25.2*  NEUTROABS 23.3*  HGB 13.7  HCT 42.7  MCV 86.1  PLT 272   Basic Metabolic Panel: Recent Labs  Lab 11/11/17 1028  NA 142  K 3.6  CL 107  CO2 23  GLUCOSE 122*  BUN 22  CREATININE 1.06*  CALCIUM 9.0   GFR: Estimated Creatinine Clearance: 69.6 mL/min (A) (by C-G formula based on SCr of 1.06 mg/dL (H)). Liver Function Tests: Recent Labs  Lab 11/11/17 1028  AST 39  ALT 24  ALKPHOS 84  BILITOT  1.3*  PROT 7.8  ALBUMIN 4.0   Recent Labs  Lab 11/11/17 1028  LIPASE 29   No results for input(s): AMMONIA in the last 168 hours. Coagulation Profile: No results for input(s): INR, PROTIME in the last 168 hours. Cardiac Enzymes: No results for input(s): CKTOTAL, CKMB, CKMBINDEX, TROPONINI in the last 168 hours. BNP (last 3 results) No results for input(s): PROBNP in the last 8760 hours. HbA1C: No results for input(s): HGBA1C in the last 72 hours. CBG: No results for input(s): GLUCAP in the last 168 hours. Lipid Profile: No results for input(s): CHOL, HDL, LDLCALC, TRIG, CHOLHDL, LDLDIRECT in the last 72 hours. Thyroid Function Tests: No results for input(s): TSH, T4TOTAL, FREET4, T3FREE, THYROIDAB in the last 72 hours. Anemia Panel: No results for input(s): VITAMINB12, FOLATE, FERRITIN, TIBC, IRON, RETICCTPCT in the last 72 hours. Urine analysis:    Component Value Date/Time   COLORURINE YELLOW 10/04/2017 1148   APPEARANCEUR HAZY (A) 10/04/2017 1148   LABSPEC 1.017 10/04/2017 1148   PHURINE 6.0 10/04/2017 1148   GLUCOSEU NEGATIVE 10/04/2017 1148   HGBUR SMALL (A) 10/04/2017 1148   BILIRUBINUR NEGATIVE 10/04/2017 1148   KETONESUR NEGATIVE 10/04/2017 1148   PROTEINUR NEGATIVE 10/04/2017 1148   UROBILINOGEN 1.0 02/28/2008 1445   NITRITE NEGATIVE 10/04/2017 1148   LEUKOCYTESUR  NEGATIVE 10/04/2017 1148   Sepsis Labs: @LABRCNTIP (procalcitonin:4,lacticidven:4) )No results found for this or any previous visit (from the past 240 hour(s)).   Radiological Exams on Admission: Ct Chest W Contrast  Result Date: 11/11/2017 CLINICAL DATA:  62 y/o  F; fever, headache, weakness. EXAM: CT CHEST, ABDOMEN, AND PELVIS WITH CONTRAST TECHNIQUE: Multidetector CT imaging of the chest, abdomen and pelvis was performed following the standard protocol during bolus administration of intravenous contrast. CONTRAST:  ISOVUE-300 IOPAMIDOL (ISOVUE-300) INJECTION 61% COMPARISON:  02/28/2008 CT abdomen and pelvis. 11/11/2017 chest radiograph. FINDINGS: CT CHEST FINDINGS Cardiovascular: No significant vascular findings. Normal heart size. No pericardial effusion. Calcific aortic atherosclerosis. Mediastinum/Nodes: No enlarged mediastinal, hilar, or axillary lymph nodes. Thyroid gland, trachea, and esophagus demonstrate no significant findings. Lungs/Pleura: Peribronchial thickening, scattered mucous plugging, clustered ground-glass nodules in bronchovascular distribution predominantly within the dependent right lung and left posterior lung base with small areas of consolidation compatible with acute bronchopneumonia. No pleural effusion or pneumothorax. Additional mild platelike atelectasis in the lungs bilaterally. Musculoskeletal: No chest wall mass or suspicious bone lesions identified. CT ABDOMEN PELVIS FINDINGS Hepatobiliary: Hepatic steatosis no focal liver abnormality is seen. No gallstones, gallbladder wall thickening, or biliary dilatation. Pancreas: Unremarkable. No pancreatic ductal dilatation or surrounding inflammatory changes. Spleen: Normal in size without focal abnormality. Adrenals/Urinary Tract: Right kidney upper and lower pole cysts measuring up to 27 mm. No additional focal kidney lesion. No urinary stone disease or hydronephrosis. Normal adrenal glands. Normal bladder. Stomach/Bowel:  Stomach is within normal limits. Appendix appears normal. No evidence of bowel wall thickening, distention, or inflammatory changes. 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 Infrarenal abdominal aorta measures up to 24 mm. Calcific aortic atherosclerosis. Reproductive: Status post hysterectomy. No adnexal masses. Other: No abdominal wall hernia or abnormality. No abdominopelvic ascites. Musculoskeletal: No acute or significant osseous findings. Mild S-shaped curvature of the spine and multilevel degenerative changes greatest at the L3-S1 levels. IMPRESSION: 1. Acute bronchopneumonia greatest in the dependent right lung. 2. Hepatic steatosis. 3. 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 4. Aortic atherosclerosis.  Electronically Signed   By: Mitzi Hansen M.D.   On: 11/11/2017 13:38   Ct Abdomen Pelvis W Contrast  Result Date: 11/11/2017 CLINICAL DATA:  62 y/o  F; fever, headache, weakness. EXAM: CT CHEST, ABDOMEN, AND PELVIS WITH CONTRAST TECHNIQUE: Multidetector CT imaging of the chest, abdomen and pelvis was performed following the standard protocol during bolus administration of intravenous contrast. CONTRAST:  ISOVUE-300 IOPAMIDOL (ISOVUE-300) INJECTION 61% COMPARISON:  02/28/2008 CT abdomen and pelvis. 11/11/2017 chest radiograph. FINDINGS: CT CHEST FINDINGS Cardiovascular: No significant vascular findings. Normal heart size. No pericardial effusion. Calcific aortic atherosclerosis. Mediastinum/Nodes: No enlarged mediastinal, hilar, or axillary lymph nodes. Thyroid gland, trachea, and esophagus demonstrate no significant findings. Lungs/Pleura: Peribronchial thickening, scattered mucous plugging, clustered ground-glass nodules in bronchovascular distribution predominantly within the dependent right lung and left posterior  lung base with small areas of consolidation compatible with acute bronchopneumonia. No pleural effusion or pneumothorax. Additional mild platelike atelectasis in the lungs bilaterally. Musculoskeletal: No chest wall mass or suspicious bone lesions identified. CT ABDOMEN PELVIS FINDINGS Hepatobiliary: Hepatic steatosis no focal liver abnormality is seen. No gallstones, gallbladder wall thickening, or biliary dilatation. Pancreas: Unremarkable. No pancreatic ductal dilatation or surrounding inflammatory changes. Spleen: Normal in size without focal abnormality. Adrenals/Urinary Tract: Right kidney upper and lower pole cysts measuring up to 27 mm. No additional focal kidney lesion. No urinary stone disease or hydronephrosis. Normal adrenal glands. Normal bladder. Stomach/Bowel: Stomach is within normal limits. Appendix appears normal. No evidence of bowel wall thickening, distention, or inflammatory changes. 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 Infrarenal abdominal aorta measures up to 24 mm. Calcific aortic atherosclerosis. Reproductive: Status post hysterectomy. No adnexal masses. Other: No abdominal wall hernia or abnormality. No abdominopelvic ascites. Musculoskeletal: No acute or significant osseous findings. Mild S-shaped curvature of the spine and multilevel degenerative changes greatest at the L3-S1 levels. IMPRESSION: 1. Acute bronchopneumonia greatest in the dependent right lung. 2. Hepatic steatosis. 3. 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 4. Aortic atherosclerosis. Electronically Signed   By: Mitzi Hansen M.D.   On: 11/11/2017 13:38   Dg Chest Port 1 View  Result Date: 11/11/2017 CLINICAL DATA:  Weakness, fever, shortness of breath EXAM: PORTABLE CHEST 1 VIEW COMPARISON:  10/05/2017 FINDINGS: Cardiomegaly with  vascular congestion. Mild elevation of the right hemidiaphragm. Right basilar atelectasis or infiltrate. No confluent opacity on the left. No effusions. IMPRESSION: Cardiomegaly, vascular congestion. Right basilar atelectasis or consolidation. Electronically Signed   By: Charlett Nose M.D.   On: 11/11/2017 11:12     Assessment/Plan Principal Problem:   Severe sepsis - RLL pneumonia (HCAP) and UTI - ED has started Levaquin and Vanc- will continue- obtain MRSA PCR and strep pneumo antigen - f/u blood and urine cultures- hold Macrobid - wean O2 as able - Incentive spirometry  Active Problems:      AKI (acute kidney injury)  - Cr 0.64 on 6/23 now up to 1.06 - hold Lisinopril and Voltaren - slow IVF started    Lactic acid acidosis - will recheck     HTN (hypertension) - hold Lisinopril    Class 2 obesity - Body mass index is 34.97 kg/m.   Chronic pain mostly bed and wheelchair bound - does not walk long distances - cont pain meds     DVT prophylaxis: Lovenox Code Status: Full code  Family Communication:   Disposition  Plan:   Consults called: none  Admission status: inpatient    Calvert Cantor MD [facility] Pager: [email or web address] Password [email or web address] 7PM-7AM, please contact night-coverage   11/11/2017, 4:39 PM

## 2017-11-11 NOTE — Progress Notes (Signed)
A consult was received from an ED physician for Aztreonam, Levaquin, Vancomycin per pharmacy dosing.  The patient's profile has been reviewed for ht/wt/allergies/indication/available labs.   A one time order has been placed for Vancomycin 2g IV, Levaquin 750mg  IV, Aztreonam 2g IV.  Further antibiotics/pharmacy consults should be ordered by admitting physician if indicated.                       Thank you, Lynann Beaverhristine Dontrey Snellgrove PharmD, BCPS Pager [phone] 11/11/2017 9:43 AM

## 2017-11-11 NOTE — Plan of Care (Signed)
  Problem: Education: Goal: Knowledge of General Education information will improve Description Including pain rating scale, medication(s)/side effects and non-pharmacologic comfort measures Outcome: Progressing   Problem: Health Behavior/Discharge Planning: Goal: Ability to manage health-related needs will improve Outcome: Progressing   Problem: Elimination: Goal: Will not experience complications related to urinary retention Outcome: Progressing   Problem: Pain Managment: Goal: General experience of comfort will improve Outcome: Progressing   Problem: Safety: Goal: Ability to remain free from injury will improve Outcome: Progressing   

## 2017-11-11 NOTE — ED Notes (Signed)
ED TO INPATIENT HANDOFF REPORT  Name/Age/Gender Heidi Green 62 y.o. female  Code Status    Code Status Orders  (From admission, onward)        Start     Ordered   11/11/17 1637  Full code  Continuous     11/11/17 1637    Code Status History    Date Active Date Inactive Code Status Order ID Comments User Context   10/04/2017 1509 10/05/2017 1537 Full Code [id number]  Welford Roche, MD ED   07/24/2016 1840 08/01/2016 1818 Full Code [id number]  Marliss Coots, PA-C Inpatient   07/18/2016 0705 07/19/2016 1511 Full Code [id number]  Rise Patience, MD ED   06/16/2012 0758 06/17/2012 2038 Full Code [id number]  Oti, Brantley Stage, MD Inpatient      Home/SNF/Other Home  Chief Complaint urinary tract infection  Level of Care/Admitting Diagnosis ED Disposition    ED Disposition Condition [facility] Area: [facility] [[id number]]  Level of Care: Med-Surg [16]  Diagnosis: Pneumonia [[id number]]  Admitting Physician: [place] [facility] [place]  Attending Physician: Debbe Odea [[id number]]  Estimated length of stay: past midnight tomorrow  Certification:: I certify this patient will need inpatient services for at least 2 midnights  PT Class (Do Not Modify): Inpatient [101]  PT Acc Code (Do Not Modify): Private [1]       Medical History Past Medical History:  Diagnosis Date  . Arthritis    in back  . Back pain   . Chest pain 06/16/2012   negative cath with normal LV function  . Headache(784.0)   . Hypertension   . 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 Kidney stones   . Stroke [facility])     Allergies Allergies  Allergen Reactions  . Garamycin [Gentamicin Sulfate] Hives and Shortness Of Breath  . Other Anaphylaxis    HORSE SERUM  . Penicillins Anaphylaxis, Itching and Swelling    Has patient had a PCN reaction causing immediate rash, facial/tongue/throat swelling, SOB or lightheadedness with hypotension: Yes Has  patient had a PCN reaction causing severe rash involving mucus membranes or skin necrosis: No Has patient had a PCN reaction that required hospitalization No Has patient had a PCN reaction occurring within the last 10 years: No If all of the above answers are "NO", then may proceed with Cephalosporin use.   . Norvasc [Amlodipine Besylate] Nausea And Vomiting    IV Location/Drains/Wounds Patient Lines/Drains/Airways Status   Active Line/Drains/Airways    Name:   Placement date:   Placement time:   Site:   Days:   Peripheral IV 11/11/17 Right Forearm   11/11/17    1029    Forearm   less than 1          Labs/Imaging Results for orders placed or performed during the hospital encounter of 11/11/17 (from the past 48 hour(s))  Comprehensive metabolic panel     Status: Abnormal   Collection Time: 11/11/17 10:28 AM  Result Value Ref Range   Sodium 142 135 - 145 mmol/L   Potassium 3.6 3.5 - 5.1 mmol/L   Chloride 107 98 - 111 mmol/L   CO2 23 22 - 32 mmol/L   Glucose, Bld 122 (H) 70 - 99 mg/dL   BUN 22 8 - 23 mg/dL   Creatinine, Ser 1.06 (H) 0.44 - 1.00 mg/dL   Calcium 9.0 8.9 - 10.3 mg/dL   Total Protein 7.8 6.5 - 8.1  g/dL   Albumin 4.0 3.5 - 5.0 g/dL   AST 39 15 - 41 U/L   ALT 24 0 - 44 U/L   Alkaline Phosphatase 84 38 - 126 U/L   Total Bilirubin 1.3 (H) 0.3 - 1.2 mg/dL   GFR calc non Af Amer 55 (L) >60 mL/min   GFR calc Af Amer >60 >60 mL/min    Comment: (NOTE) The eGFR has been calculated using the CKD EPI equation. This calculation has not been validated in all clinical situations. eGFR's persistently <60 mL/min signify possible Chronic Kidney Disease.    Anion gap 12 5 - 15    Comment: Performed at [facility], [place] [address], [place], [place] [address]  CBC WITH DIFFERENTIAL     Status: Abnormal   Collection Time: 11/11/17 10:28 AM  Result Value Ref Range   WBC 25.2 (H) 4.0 - 10.5 K/uL   RBC 4.96 3.87 - 5.11 MIL/uL   Hemoglobin 13.7 12.0 - 15.0 g/dL    HCT 42.7 36.0 - 46.0 %   MCV 86.1 78.0 - 100.0 fL   MCH 27.6 26.0 - 34.0 pg   MCHC 32.1 30.0 - 36.0 g/dL   RDW 13.5 11.5 - 15.5 %   Platelets 272 150 - 400 K/uL   Neutrophils Relative % 92 %   Neutro Abs 23.3 (H) 1.7 - 7.7 K/uL   Lymphocytes Relative 3 %   Lymphs Abs 0.7 0.7 - 4.0 K/uL   Monocytes Relative 5 %   Monocytes Absolute 1.1 (H) 0.1 - 1.0 K/uL   Eosinophils Relative 0 %   Eosinophils Absolute 0.0 0.0 - 0.7 K/uL   Basophils Relative 0 %   Basophils Absolute 0.0 0.0 - 0.1 K/uL    Comment: Performed at [facility], [place] [address], [place], [place] [address]  Lipase, blood     Status: None   Collection Time: 11/11/17 10:28 AM  Result Value Ref Range   Lipase 29 11 - 51 U/L    Comment: Performed at [facility], [place] [address], [place], [place] [address]  I-Stat CG4 Lactic Acid, ED  (not at  [facility])     Status: Abnormal   Collection Time: 11/11/17 10:35 AM  Result Value Ref Range   Lactic Acid, Venous 2.12 (HH) 0.5 - 1.9 mmol/L   Comment NOTIFIED PHYSICIAN    Ct Chest W Contrast  Result Date: 11/11/2017 CLINICAL DATA:  62 y/o  F; fever, headache, weakness. EXAM: CT CHEST, ABDOMEN, AND PELVIS WITH CONTRAST TECHNIQUE: Multidetector CT imaging of the chest, abdomen and pelvis was performed following the standard protocol during bolus administration of intravenous contrast. CONTRAST:  179m ISOVUE-300 IOPAMIDOL (ISOVUE-300) INJECTION 61% COMPARISON:  02/28/2008 CT abdomen and pelvis. 11/11/2017 chest radiograph. FINDINGS: CT CHEST FINDINGS Cardiovascular: No significant vascular findings. Normal heart size. No pericardial effusion. Calcific aortic atherosclerosis. Mediastinum/Nodes: No enlarged mediastinal, hilar, or axillary lymph nodes. Thyroid gland, trachea, and esophagus demonstrate no significant findings. Lungs/Pleura: Peribronchial thickening, scattered mucous plugging, clustered ground-glass nodules in bronchovascular distribution  predominantly within the dependent right lung and left posterior lung base with small areas of consolidation compatible with acute bronchopneumonia. No pleural effusion or pneumothorax. Additional mild platelike atelectasis in the lungs bilaterally. Musculoskeletal: No chest wall mass or suspicious bone lesions identified. CT ABDOMEN PELVIS FINDINGS Hepatobiliary: Hepatic steatosis no focal liver abnormality is seen. No gallstones, gallbladder wall thickening, or biliary dilatation. Pancreas: Unremarkable. No pancreatic ductal dilatation or surrounding inflammatory changes. Spleen: Normal  in size without focal abnormality. Adrenals/Urinary Tract: Right kidney upper and lower pole cysts measuring up to 27 mm. No additional focal kidney lesion. No urinary stone disease or hydronephrosis. Normal adrenal glands. Normal bladder. Stomach/Bowel: Stomach is within normal limits. Appendix appears normal. No evidence of bowel wall thickening, distention, or inflammatory changes. 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 Infrarenal abdominal aorta measures up to 24 mm. Calcific aortic atherosclerosis. Reproductive: Status post hysterectomy. No adnexal masses. Other: No abdominal wall hernia or abnormality. No abdominopelvic ascites. Musculoskeletal: No acute or significant osseous findings. Mild S-shaped curvature of the spine and multilevel degenerative changes greatest at the L3-S1 levels. IMPRESSION: 1. Acute bronchopneumonia greatest in the dependent right lung. 2. Hepatic steatosis. 3. 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 4. Aortic atherosclerosis. Electronically Signed   By: Kristine Garbe M.D.   On: 11/11/2017 13:38   Ct Abdomen Pelvis W Contrast  Result Date: 11/11/2017 CLINICAL DATA:  62 y/o  F; fever, headache, weakness. EXAM: CT  CHEST, ABDOMEN, AND PELVIS WITH CONTRAST TECHNIQUE: Multidetector CT imaging of the chest, abdomen and pelvis was performed following the standard protocol during bolus administration of intravenous contrast. CONTRAST:  130m ISOVUE-300 IOPAMIDOL (ISOVUE-300) INJECTION 61% COMPARISON:  02/28/2008 CT abdomen and pelvis. 11/11/2017 chest radiograph. FINDINGS: CT CHEST FINDINGS Cardiovascular: No significant vascular findings. Normal heart size. No pericardial effusion. Calcific aortic atherosclerosis. Mediastinum/Nodes: No enlarged mediastinal, hilar, or axillary lymph nodes. Thyroid gland, trachea, and esophagus demonstrate no significant findings. Lungs/Pleura: Peribronchial thickening, scattered mucous plugging, clustered ground-glass nodules in bronchovascular distribution predominantly within the dependent right lung and left posterior lung base with small areas of consolidation compatible with acute bronchopneumonia. No pleural effusion or pneumothorax. Additional mild platelike atelectasis in the lungs bilaterally. Musculoskeletal: No chest wall mass or suspicious bone lesions identified. CT ABDOMEN PELVIS FINDINGS Hepatobiliary: Hepatic steatosis no focal liver abnormality is seen. No gallstones, gallbladder wall thickening, or biliary dilatation. Pancreas: Unremarkable. No pancreatic ductal dilatation or surrounding inflammatory changes. Spleen: Normal in size without focal abnormality. Adrenals/Urinary Tract: Right kidney upper and lower pole cysts measuring up to 27 mm. No additional focal kidney lesion. No urinary stone disease or hydronephrosis. Normal adrenal glands. Normal bladder. Stomach/Bowel: Stomach is within normal limits. Appendix appears normal. No evidence of bowel wall thickening, distention, or inflammatory changes. 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 Infrarenal  abdominal aorta measures up to 24 mm. Calcific aortic atherosclerosis. Reproductive: Status post hysterectomy. No adnexal masses. Other: No abdominal wall hernia or abnormality. No abdominopelvic ascites. Musculoskeletal: No acute or significant osseous findings. Mild S-shaped curvature of the spine and multilevel degenerative changes greatest at the L3-S1 levels. IMPRESSION: 1. Acute bronchopneumonia greatest in the dependent right lung. 2. Hepatic steatosis. 3. 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 4. Aortic atherosclerosis. Electronically Signed   By: LKristine GarbeM.D.   On: 11/11/2017 13:38   Dg Chest Port 1 View  Result Date: 11/11/2017 CLINICAL DATA:  Weakness, fever, shortness of breath EXAM: PORTABLE CHEST 1 VIEW COMPARISON:  10/05/2017 FINDINGS: Cardiomegaly with vascular congestion. Mild elevation of the right hemidiaphragm. Right basilar atelectasis or infiltrate. No confluent opacity on the left. No effusions. IMPRESSION: Cardiomegaly, vascular congestion. Right basilar atelectasis or consolidation. Electronically Signed   By:  Rolm Baptise M.D.   On: 11/11/2017 11:12    Pending Labs Unresulted Labs (From admission, onward)   Start     Ordered   11/18/17 0500  Creatinine, serum  (enoxaparin (LOVENOX)    CrCl >/= 30 ml/min)  Weekly,   R    Comments:  while on enoxaparin therapy    11/11/17 1637   11/12/17 3235  Basic metabolic panel  Tomorrow morning,   R     11/11/17 1637   11/12/17 0500  CBC  Tomorrow morning,   R     11/11/17 1637   11/11/17 1659  Lactic acid, plasma  Once,   R     11/11/17 1658   11/11/17 1656  MRSA PCR Screening  Once,   R     11/11/17 1655   11/11/17 1653  HIV antibody (Routine Screening)  Once,   R     11/11/17 1652   11/11/17 1653  Culture, blood (routine x 2) Call MD if unable to obtain prior to antibiotics being given  BLOOD CULTURE X 2,   R    Comments:  If blood cultures drawn in Emergency  Department - Do not draw and cancel order    11/11/17 1652   11/11/17 1653  Gram stain  Once,   R     11/11/17 1652   11/11/17 1653  Strep pneumoniae urinary antigen  Once,   R     11/11/17 1652   11/11/17 1637  CBC  (enoxaparin (LOVENOX)    CrCl >/= 30 ml/min)  Once,   R    Comments:  Baseline for enoxaparin therapy IF NOT ALREADY DRAWN.  Notify MD if PLT < 100 K.    11/11/17 1637   11/11/17 1637  Creatinine, serum  (enoxaparin (LOVENOX)    CrCl >/= 30 ml/min)  Once,   R    Comments:  Baseline for enoxaparin therapy IF NOT ALREADY DRAWN.    11/11/17 1637   11/11/17 0926  Blood Culture (routine x 2)  BLOOD CULTURE X 2,   STAT     11/11/17 0926   11/11/17 0926  Urinalysis, Routine w reflex microscopic  STAT,   STAT     11/11/17 0926   11/11/17 0926  Urine culture  STAT,   STAT     11/11/17 0926      Vitals/Pain Today's Vitals   11/11/17 1430 11/11/17 1500 11/11/17 1530 11/11/17 1600  BP: 104/63 100/66 (!) 101/53 99/62  Pulse: 81 79 77 78  Resp: (!) 24 (!) 25 (!) 22 20  Temp:      TempSrc:      SpO2: 96% 93% 95% 90%  Weight:      Height:      PainSc:        Isolation Precautions No active isolations  Medications Medications  iopamidol (ISOVUE-300) 61 % injection (has no administration in time range)  enoxaparin (LOVENOX) injection 40 mg (has no administration in time range)  acetaminophen (TYLENOL) tablet 650 mg (has no administration in time range)    Or  acetaminophen (TYLENOL) suppository 650 mg (has no administration in time range)  ondansetron (ZOFRAN) tablet 4 mg (has no administration in time range)    Or  ondansetron (ZOFRAN) injection 4 mg (has no administration in time range)  DULoxetine (CYMBALTA) DR capsule 60 mg (has no administration in time range)  atorvastatin (LIPITOR) tablet 40 mg (has no administration in time range)  pantoprazole (PROTONIX) EC tablet 40 mg (  has no administration in time range)  HYDROcodone-acetaminophen (NORCO) 7.5-325 MG per  tablet 1 tablet (has no administration in time range)  pregabalin (LYRICA) capsule 150 mg (has no administration in time range)  mirabegron ER (MYRBETRIQ) tablet 25 mg (has no administration in time range)  rOPINIRole (REQUIP) tablet 2 mg (has no administration in time range)  0.45 % sodium chloride infusion (has no administration in time range)  levofloxacin (LEVAQUIN) IVPB 750 mg (0 mg Intravenous Stopped 11/11/17 1413)  aztreonam (AZACTAM) 2 g in sodium chloride 0.9 % 100 mL IVPB (0 g Intravenous Stopped 11/11/17 1139)  vancomycin (VANCOCIN) 2,000 mg in sodium chloride 0.9 % 500 mL IVPB (2,000 mg Intravenous New Bag/Given 11/11/17 1332)  iopamidol (ISOVUE-300) 61 % injection 100 mL (100 mLs Intravenous Contrast Given 11/11/17 1244)    Mobility walks

## 2017-11-11 NOTE — ED Provider Notes (Signed)
[facility]-EMERGENCY DEPT Provider Note   CSN: [id number] Arrival date & time: 11/11/17  4098     History   Chief Complaint Chief Complaint  Patient presents with  . Urinary Tract Infection  . Fever    HPI Heidi Green is a 62 y.o. female.  Patient brought in by EMS from home.  Patient diagnosed with urinary tract infection on Saturday on Sunday started Macrobid.  Patient has a pen allergy.  Patient with fever early this morning 202.  Also had fever yesterday.  Patient with baseline right-sided deficit and speech problems from a stroke in 2018.  Patient with a complaint of some abdominal pain across the lower abdomen left to right.  Patient also feeling short of breath.  Triage and mentioned headache patient denied any headache to me.  Patient with admission in June for pneumonia.     Past Medical History:  Diagnosis Date  . Arthritis    in back  . Back pain   . Chest pain 06/16/2012   negative cath with normal LV function  . Headache(784.0)   . Hypertension   . 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 Kidney stones   . Stroke [facility])     Patient Active Problem List   Diagnosis Date Noted  . CAP (community acquired pneumonia) 10/04/2017  . Class 2 obesity   . HTN (hypertension) 06/16/2012  . Dyslipidemia 06/16/2012  . Back pain 06/16/2012  . Tobacco abuse 06/16/2012    Past Surgical History:  Procedure Laterality Date  . ABDOMINAL HYSTERECTOMY    . BACK SURGERY     x 3  . LEFT HEART CATHETERIZATION WITH CORONARY ANGIOGRAM N/A 06/16/2012   Procedure: LEFT HEART CATHETERIZATION WITH CORONARY ANGIOGRAM;  Surgeon: Peter M Swaziland, MD;  Location: [facility] CATH LAB;  Service: Cardiovascular;  Laterality: N/A;  . LITHOTRIPSY    . TUBAL LIGATION       OB History   None      Home Medications    Prior to Admission medications   Medication Sig Start Date End Date Taking? Authorizing Provider  atorvastatin  (LIPITOR) 40 MG tablet Take 40 mg by mouth daily. 01/13/17  Yes [provider]  diclofenac (VOLTAREN) 50 MG EC tablet TAKE 1 TABLET PO TWICE A DAY 10/11/16  Yes [provider]  DULoxetine (CYMBALTA) 60 MG capsule TAKE 1 CAPSULE PO AT BEDITME 10/11/16  Yes [provider]  esomeprazole (NEXIUM) 20 MG capsule Take 20 mg by mouth daily as needed (GERD).   Yes [provider]  HYDROcodone-acetaminophen (NORCO) 7.5-325 MG tablet Take 1 tablet by mouth every 6 (six) hours as needed (pain).    Yes [provider]  lisinopril (PRINIVIL,ZESTRIL) 20 MG tablet 20 mg daily.  01/13/17  Yes [provider]  LYRICA 150 MG capsule Take 150 mg by mouth 2 (two) times daily. 09/06/17  Yes [provider]  mirabegron ER (MYRBETRIQ) 25 MG TB24 tablet Take 25 mg by mouth at bedtime.   Yes [provider]  nitrofurantoin, macrocrystal-monohydrate, (MACROBID) 100 MG capsule Take 100 mg by mouth 2 (two) times daily. 11/08/17  Yes [provider]  rOPINIRole (REQUIP) 2 MG tablet Take 2 mg by mouth 2 (two) times daily.    Yes [provider]  Vitamin D, Ergocalciferol, (DRISDOL) 50000 units CAPS capsule Take 50,000 Units by mouth once a week. 10/31/17  Yes [provider]  acetaminophen (TYLENOL) 325 MG  tablet Take 2 tablets (650 mg total) by mouth every 4 (four) hours as needed for mild pain (or temp > 37.5 C (99.5 F)). Combination of acetaminophen in vicodin and tylenol should not exceed max dose of 4000mg  daily Patient not taking: Reported on 11/11/2017 08/01/16   Layne Benton, NP  levofloxacin (LEVAQUIN) 750 MG tablet Take 1 tablet (750 mg total) by mouth daily. Patient not taking: Reported on 11/11/2017 10/05/17   Burna Cash, MD  senna-docusate (SENOKOT-S) 8.6-50 MG tablet Take 1 tablet by mouth 2 (two) times daily. Patient not taking: Reported on 01/15/2017 08/01/16   Layne Benton, NP    Family History Family  History  Problem Relation Age of Onset  . Emphysema Mother   . Diabetes Mother     Social History Social History   Tobacco Use  . Smoking status: Former Smoker    Packs/day: 0.50    Years: 26.00    Pack years: 13.00    Types: Cigarettes    Last attempt to quit: 06/17/2012    Years since quitting: 5.4  . Smokeless tobacco: Never Used  Substance Use Topics  . Alcohol use: No  . Drug use: No     Allergies   Garamycin [gentamicin sulfate]; Other; Penicillins; and Norvasc [amlodipine besylate]   Review of Systems Review of Systems  Constitutional: Positive for fever.  HENT: Negative for congestion.   Eyes: Negative for visual disturbance.  Respiratory: Positive for shortness of breath.   Gastrointestinal: Positive for abdominal pain.  Genitourinary: Negative for dysuria.  Musculoskeletal: Negative for back pain.  Skin: Negative for rash.  Neurological: Positive for speech difficulty and weakness. Negative for headaches.  Hematological: Does not bruise/bleed easily.  Psychiatric/Behavioral: Negative for confusion.     Physical Exam Updated Vital Signs BP 100/66   Pulse 79   Temp 99.3 F (37.4 C) (Oral)   Resp (!) 25   Ht 1.727 m (5\' 8" )   Wt 104.3 kg (230 lb)   SpO2 93%   BMI 34.97 kg/m   Physical Exam  Constitutional: She is oriented to person, place, and time. She appears well-developed and well-nourished. She appears distressed.  HENT:  Head: Normocephalic.  Mouth/Throat: Oropharynx is clear and moist.  Eyes: Pupils are equal, round, and reactive to light. Conjunctivae and EOM are normal.  Neck: Neck supple.  Cardiovascular: Normal rate, regular rhythm and normal heart sounds.  Pulmonary/Chest: She is in respiratory distress.  Abdominal: Soft. Bowel sounds are normal. There is no tenderness.  Musculoskeletal: Normal range of motion.  Bilateral erythema to both lower extremities below the knee according to husband this is baseline.  Neurological: She is  alert and oriented to person, place, and time. No sensory deficit.  Patient secondary to previous CVA with some speech difficulties and right-sided weakness.  Skin: Skin is warm.  Nursing note and vitals reviewed.    ED Treatments / Results  Labs (all labs ordered are listed, but only abnormal results are displayed) Labs Reviewed  COMPREHENSIVE METABOLIC PANEL - Abnormal; Notable for the following components:      Result Value   Glucose, Bld 122 (*)    Creatinine, Ser 1.06 (*)    Total Bilirubin 1.3 (*)    GFR calc non Af Amer 55 (*)    All other components within normal limits  CBC WITH DIFFERENTIAL/PLATELET - Abnormal; Notable for the following components:   WBC 25.2 (*)    Neutro Abs 23.3 (*)    Monocytes Absolute 1.1 (*)  All other components within normal limits  I-STAT CG4 LACTIC ACID, ED - Abnormal; Notable for the following components:   Lactic Acid, Venous 2.12 (*)    All other components within normal limits  CULTURE, BLOOD (ROUTINE X 2)  CULTURE, BLOOD (ROUTINE X 2)  URINE CULTURE  LIPASE, BLOOD  URINALYSIS, ROUTINE W REFLEX MICROSCOPIC  I-STAT CG4 LACTIC ACID, ED    EKG None  Radiology Ct Chest W Contrast  Result Date: 11/11/2017 CLINICAL DATA:  62 y/o  F; fever, headache, weakness. EXAM: CT CHEST, ABDOMEN, AND PELVIS WITH CONTRAST TECHNIQUE: Multidetector CT imaging of the chest, abdomen and pelvis was performed following the standard protocol during bolus administration of intravenous contrast. CONTRAST:  ISOVUE-300 IOPAMIDOL (ISOVUE-300) INJECTION 61% COMPARISON:  02/28/2008 CT abdomen and pelvis. 11/11/2017 chest radiograph. FINDINGS: CT CHEST FINDINGS Cardiovascular: No significant vascular findings. Normal heart size. No pericardial effusion. Calcific aortic atherosclerosis. Mediastinum/Nodes: No enlarged mediastinal, hilar, or axillary lymph nodes. Thyroid gland, trachea, and esophagus demonstrate no significant findings. Lungs/Pleura: Peribronchial  thickening, scattered mucous plugging, clustered ground-glass nodules in bronchovascular distribution predominantly within the dependent right lung and left posterior lung base with small areas of consolidation compatible with acute bronchopneumonia. No pleural effusion or pneumothorax. Additional mild platelike atelectasis in the lungs bilaterally. Musculoskeletal: No chest wall mass or suspicious bone lesions identified. CT ABDOMEN PELVIS FINDINGS Hepatobiliary: Hepatic steatosis no focal liver abnormality is seen. No gallstones, gallbladder wall thickening, or biliary dilatation. Pancreas: Unremarkable. No pancreatic ductal dilatation or surrounding inflammatory changes. Spleen: Normal in size without focal abnormality. Adrenals/Urinary Tract: Right kidney upper and lower pole cysts measuring up to 27 mm. No additional focal kidney lesion. No urinary stone disease or hydronephrosis. Normal adrenal glands. Normal bladder. Stomach/Bowel: Stomach is within normal limits. Appendix appears normal. No evidence of bowel wall thickening, distention, or inflammatory changes. 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 Infrarenal abdominal aorta measures up to 24 mm. Calcific aortic atherosclerosis. Reproductive: Status post hysterectomy. No adnexal masses. Other: No abdominal wall hernia or abnormality. No abdominopelvic ascites. Musculoskeletal: No acute or significant osseous findings. Mild S-shaped curvature of the spine and multilevel degenerative changes greatest at the L3-S1 levels. IMPRESSION: 1. Acute bronchopneumonia greatest in the dependent right lung. 2. Hepatic steatosis. 3. 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 4. Aortic atherosclerosis. Electronically Signed   By: Mitzi Hansen M.D.   On: 11/11/2017 13:38   Ct Abdomen Pelvis W  Contrast  Result Date: 11/11/2017 CLINICAL DATA:  62 y/o  F; fever, headache, weakness. EXAM: CT CHEST, ABDOMEN, AND PELVIS WITH CONTRAST TECHNIQUE: Multidetector CT imaging of the chest, abdomen and pelvis was performed following the standard protocol during bolus administration of intravenous contrast. CONTRAST:  ISOVUE-300 IOPAMIDOL (ISOVUE-300) INJECTION 61% COMPARISON:  02/28/2008 CT abdomen and pelvis. 11/11/2017 chest radiograph. FINDINGS: CT CHEST FINDINGS Cardiovascular: No significant vascular findings. Normal heart size. No pericardial effusion. Calcific aortic atherosclerosis. Mediastinum/Nodes: No enlarged mediastinal, hilar, or axillary lymph nodes. Thyroid gland, trachea, and esophagus demonstrate no significant findings. Lungs/Pleura: Peribronchial thickening, scattered mucous plugging, clustered ground-glass nodules in bronchovascular distribution predominantly within the dependent right lung and left posterior lung base with small areas of consolidation compatible with acute bronchopneumonia. No pleural effusion or pneumothorax. Additional mild platelike atelectasis in the lungs bilaterally. Musculoskeletal: No chest wall mass or suspicious bone lesions identified. CT ABDOMEN PELVIS FINDINGS Hepatobiliary: Hepatic  steatosis no focal liver abnormality is seen. No gallstones, gallbladder wall thickening, or biliary dilatation. Pancreas: Unremarkable. No pancreatic ductal dilatation or surrounding inflammatory changes. Spleen: Normal in size without focal abnormality. Adrenals/Urinary Tract: Right kidney upper and lower pole cysts measuring up to 27 mm. No additional focal kidney lesion. No urinary stone disease or hydronephrosis. Normal adrenal glands. Normal bladder. Stomach/Bowel: Stomach is within normal limits. Appendix appears normal. No evidence of bowel wall thickening, distention, or inflammatory changes. 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 Infrarenal abdominal aorta measures up to 24 mm. Calcific aortic atherosclerosis. Reproductive: Status post hysterectomy. No adnexal masses. Other: No abdominal wall hernia or abnormality. No abdominopelvic ascites. Musculoskeletal: No acute or significant osseous findings. Mild S-shaped curvature of the spine and multilevel degenerative changes greatest at the L3-S1 levels. IMPRESSION: 1. Acute bronchopneumonia greatest in the dependent right lung. 2. Hepatic steatosis. 3. 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 4. Aortic atherosclerosis. Electronically Signed   By: Mitzi HansenLance  Furusawa-Stratton M.D.   On: 11/11/2017 13:38   Dg Chest Port 1 View  Result Date: 11/11/2017 CLINICAL DATA:  Weakness, fever, shortness of breath EXAM: PORTABLE CHEST 1 VIEW COMPARISON:  10/05/2017 FINDINGS: Cardiomegaly with vascular congestion. Mild elevation of the right hemidiaphragm. Right basilar atelectasis or infiltrate. No confluent opacity on the left. No effusions. IMPRESSION: Cardiomegaly, vascular congestion. Right basilar atelectasis or consolidation. Electronically Signed   By: Charlett NoseKevin  Dover M.D.   On: 11/11/2017 11:12    Procedures Procedures (including critical care time)  CRITICAL CARE Performed by: Vanetta MuldersZACKOWSKI,Saydee Zolman Total critical care time: 30 minutes Critical care time was exclusive of separately billable procedures and treating other patients. Critical care was necessary to treat or prevent imminent or life-threatening deterioration. Critical care was time spent personally by me on the following activities: development of treatment plan with patient and/or surrogate as well as nursing, discussions with consultants, evaluation of patient's response to treatment, examination of patient, obtaining history from patient or surrogate, ordering and performing treatments and  interventions, ordering and review of laboratory studies, ordering and review of radiographic studies, pulse oximetry and re-evaluation of patient's condition.   Medications Ordered in ED Medications  vancomycin (VANCOCIN) 2,000 mg in sodium chloride 0.9 % 500 mL IVPB (2,000 mg Intravenous New Bag/Given 11/11/17 1332)  iopamidol (ISOVUE-300) 61 % injection (has no administration in time range)  levofloxacin (LEVAQUIN) IVPB 750 mg (0 mg Intravenous Stopped 11/11/17 1413)  aztreonam (AZACTAM) 2 g in sodium chloride 0.9 % 100 mL IVPB (0 g Intravenous Stopped 11/11/17 1139)  iopamidol (ISOVUE-300) 61 % injection 100 mL (100 mLs Intravenous Contrast Given 11/11/17 1244)     Initial Impression / Assessment and Plan / ED Course  I have reviewed the triage vital signs and the nursing notes.  Pertinent labs & imaging results that were available during my care of the patient were reviewed by me and considered in my medical decision making (see chart for details).    Patient recently treated for urinary tract infection started on Macrobid on Sunday.  Patient had a fever this morning to 102.  Had a little bit of fever yesterday then resolved.  Patient has had some increased shortness of breath.  Patient not normally on oxygen but requiring oxygen here.  Patient had a CVA in 2018 resulting in right-sided abnormalities and speech problems.  Patient about a month ago was  admitted for pneumonia.  Work-up here today's chest x-ray raise some question of pneumonia but could have been atelectasis CT chest was therefore done which confirmed a right lower lobe pneumonia.  This would be healthcare acquired based on her recent admission in June.  Patient initially started on septic protocol although she was not hypotensive or tachycardic she did have a significant fever.  Patient has an allergy to penicillin so therefore received penicillin allergic protocol antibiotics.  Blood cultures done as well.  Patient  Patient  remained stable.  Lactic acid was elevated greater than 2 with less than 4 patient never became hypotensive.  Patient will require admission.     Final Clinical Impressions(s) / ED Diagnoses   Final diagnoses:  HCAP (healthcare-associated pneumonia)  Hypoxia    ED Discharge Orders    None       Vanetta Mulders, MD 11/11/17 [phone]

## 2017-11-11 NOTE — ED Triage Notes (Signed)
EMS reports from home, Dx with UTI on Saturday, woke up today with fever per husband, c/o headache and weakness. Stroke with baseline right sided deficit in 2018  BP 148/84 HR 114 Resp 20 Sp02 98 on 2lts CBG 119

## 2017-11-11 NOTE — Progress Notes (Signed)
Pharmacy Antibiotic Note  Heidi Green is a 62 y.o. female admitted on 11/11/2017 with pneumonia.  Pharmacy has been consulted for vancomycin + levofloxacin dosing.  Patient was recently treated for PNA in June 2019 with levofloxacin and was taking nitrofurantoin PTA for UTI. Chest x-ray shows infiltrate, antibiotics being started for treatment of PNA. Patient has PCN allergy (anaphylaxis). Received vancomycin 2000 mg, levofloxacin 750 mg, and aztreonam 2 g in ED on 7/30.  Today, 11/11/17  WBC 25.2  Afebrile  Plan:  Levofloxacin 750 mg IV q24h  Vancomycin 2000 mg IV loading dose received in ED. Will order vancomycin 1250 mg IV q24h maintenance dose  Goal AUC 400-500  Monitor MRSA PCR, cultures, renal function, and vancomycin levels once at steady state  Height: 5\' 8"  (172.7 cm) Weight: 230 lb (104.3 kg) IBW/kg (Calculated) : 63.9  Temp (24hrs), Avg:99.3 F (37.4 C), Min:99.3 F (37.4 C), Max:99.3 F (37.4 C)  Recent Labs  Lab 11/11/17 1028 11/11/17 1035  WBC 25.2*  --   CREATININE 1.06*  --   LATICACIDVEN  --  2.12*    Estimated Creatinine Clearance: 69.6 mL/min (A) (by C-G formula based on SCr of 1.06 mg/dL (H)).    Allergies  Allergen Reactions  . Garamycin [Gentamicin Sulfate] Hives and Shortness Of Breath  . Other Anaphylaxis    HORSE SERUM  . Penicillins Anaphylaxis, Itching and Swelling    Has patient had a PCN reaction causing immediate rash, facial/tongue/throat swelling, SOB or lightheadedness with hypotension: Yes Has patient had a PCN reaction causing severe rash involving mucus membranes or skin necrosis: No Has patient had a PCN reaction that required hospitalization No Has patient had a PCN reaction occurring within the last 10 years: No If all of the above answers are "NO", then may proceed with Cephalosporin use.   Eliezer Mccoy. Norvasc [Amlodipine Besylate] Nausea And Vomiting   Antimicrobials this admission: vancomycin 7/30 >>  levofloxacin 7/30 >>   Aztreonam one dose in ED 7/30  Dose adjustments this admission:  Microbiology results: 7/30 BCx: Sent 7/30 UCx: Sent  7/30 Sputum: Sent  7/30 MRSA PCR: Sent  Thank you for allowing pharmacy to be a part of this patient's care.  Cindi CarbonMary M Keanthony Poole, PharmD, BCPS Clinical Pharmacist 11/11/2017 5:24 PM

## 2017-11-11 NOTE — Progress Notes (Addendum)
CRITICAL VALUE ALERT  Critical Value:  Lactic acid 2.5  Date & Time Notied:  11/11/17 2103  Provider Notified: Bruna PotterBlount, NP  Orders Received/Actions taken: No orders placed at this time. Will continue to monitor.

## 2017-11-12 ENCOUNTER — Inpatient Hospital Stay (HOSPITAL_COMMUNITY): Payer: Medicare HMO

## 2017-11-12 DIAGNOSIS — A419 Sepsis, unspecified organism: Principal | ICD-10-CM

## 2017-11-12 DIAGNOSIS — R6520 Severe sepsis without septic shock: Secondary | ICD-10-CM

## 2017-11-12 LAB — CBC
HEMATOCRIT: 36.2 % (ref 36.0–46.0)
Hemoglobin: 11.5 g/dL — ABNORMAL LOW (ref 12.0–15.0)
MCH: 27.6 pg (ref 26.0–34.0)
MCHC: 31.8 g/dL (ref 30.0–36.0)
MCV: 87 fL (ref 78.0–100.0)
PLATELETS: 249 10*3/uL (ref 150–400)
RBC: 4.16 MIL/uL (ref 3.87–5.11)
RDW: 13.9 % (ref 11.5–15.5)
WBC: 26.5 10*3/uL — ABNORMAL HIGH (ref 4.0–10.5)

## 2017-11-12 LAB — BASIC METABOLIC PANEL
Anion gap: 8 (ref 5–15)
BUN: 32 mg/dL — AB (ref 8–23)
CHLORIDE: 106 mmol/L (ref 98–111)
CO2: 25 mmol/L (ref 22–32)
CREATININE: 1.17 mg/dL — AB (ref 0.44–1.00)
Calcium: 8.3 mg/dL — ABNORMAL LOW (ref 8.9–10.3)
GFR calc Af Amer: 57 mL/min — ABNORMAL LOW (ref >60)
GFR calc non Af Amer: 49 mL/min — ABNORMAL LOW (ref >60)
GLUCOSE: 105 mg/dL — AB (ref 70–99)
POTASSIUM: 3.3 mmol/L — AB (ref 3.5–5.1)
Sodium: 139 mmol/L (ref 135–145)

## 2017-11-12 LAB — STREP PNEUMONIAE URINARY ANTIGEN: Strep Pneumo Urinary Antigen: NEGATIVE

## 2017-11-12 LAB — HIV ANTIBODY (ROUTINE TESTING W REFLEX): HIV Screen 4th Generation wRfx: NONREACTIVE

## 2017-11-12 MED ORDER — POTASSIUM CHLORIDE CRYS ER 20 MEQ PO TBCR
40.0000 meq | EXTENDED_RELEASE_TABLET | Freq: Once | ORAL | Status: AC
  Administered 2017-11-12: 40 meq via ORAL
  Filled 2017-11-12: qty 2

## 2017-11-12 NOTE — Progress Notes (Signed)
[facility] Progress Note  Patient: Heidi Green ZOX:[id number]   PCP: Karle PlumberArvind, Moogali M, MD DOB: 01/19/1956   DOA: 11/11/2017   DOS: 11/12/2017   Date of Service: the patient was seen and examined on 11/12/2017  Subjective: Continues to have cough and shortness of breath.  No nausea no vomiting.  Has some chills.  No fever.  Brief hospital course: Pt. with PMH of HTN, CVA, recent admission for pneumonia; admitted on 11/11/2017, presented with complaint of fever and chills, was found to have right lower lobe pneumonia likely aspiration. Currently further plan is continue IV antibiotics.  Assessment and Plan: 1.  Aspiration pneumonia. Marland Kitchen. Physiology sepsis resolved. Started on IV vancomycin and Levaquin, MRSA PCR is positive.  Strep antigen is negative. Urine culture negative. Blood culture currently pending. Speech therapy consulted although patient refused MBS. Most likely this is aspiration pneumonia and the patient who already has history of dysphagia. Currently we will continue vancomycin until cultures come back and will likely transition to only Levaquin. Cannot use penicillin due to allergy.  2.  Essential hypertension. Acute kidney injury. Lactic acidosis Blood pressure medications are on hold. Gentle IV fluids are given. Monitor.  3.  Chronic pain syndrome. Patient on home pain medication which is likely contributing to patient's risk for aspiration. Recommend down titration outpatient.  4.  History of CVA with intracranial hemorrhage. Close monitoring for now. Patient is not on any antithrombotic therapy outpatient. Continue Lipitor.  Diet: per speechtherapy DVT Prophylaxis: subcutaneous Heparin  Advance goals of care discussion: full code  Family Communication: no family was present at bedside, at the time of interview.   Disposition:  Discharge to be determined.  Consultants: none Procedures: none  Antibiotics: Anti-infectives (From admission, onward)     Start     Dose/Rate Route Frequency Ordered Stop   11/12/17 1300  vancomycin (VANCOCIN) 1,250 mg in sodium chloride 0.9 % 250 mL IVPB     1,250 mg 166.7 mL/hr over 90 Minutes Intravenous Every 24 hours 11/11/17 1734     11/12/17 1000  levofloxacin (LEVAQUIN) IVPB 750 mg     750 mg 100 mL/hr over 90 Minutes Intravenous Every 24 hours 11/11/17 1733     11/11/17 0945  vancomycin (VANCOCIN) 2,000 mg in sodium chloride 0.9 % 500 mL IVPB     2,000 mg 250 mL/hr over 120 Minutes Intravenous  Once 11/11/17 0942 11/11/17 1848   11/11/17 0930  levofloxacin (LEVAQUIN) IVPB 750 mg     750 mg 100 mL/hr over 90 Minutes Intravenous  Once 11/11/17 0926 11/11/17 1413   11/11/17 0930  aztreonam (AZACTAM) 2 g in sodium chloride 0.9 % 100 mL IVPB     2 g 200 mL/hr over 30 Minutes Intravenous  Once 11/11/17 0926 11/11/17 1139   11/11/17 0930  vancomycin (VANCOCIN) IVPB 1000 mg/200 mL premix  Status:  Discontinued     1,000 mg 200 mL/hr over 60 Minutes Intravenous  Once 11/11/17 0926 11/11/17 0942       Objective: Physical Exam: Vitals:   11/11/17 2052 11/12/17 0432 11/12/17 1132 11/12/17 1444  BP: (!) 103/59 98/60  (!) 105/56  Pulse: 81 79  79  Resp: 18 18  12   Temp: 98.5 F (36.9 C) 97.7 F (36.5 C)  98.2 F (36.8 C)  TempSrc: Oral Oral  Oral  SpO2: 94% 90% 90% 90%  Weight:      Height:        Intake/Output Summary (Last 24 hours) at 11/12/2017 1805  Last data filed at 11/12/2017 1719 Gross per 24 hour  Intake 1726.98 ml  Output 800 ml  Net 926.98 ml   Filed Weights   11/11/17 1013  Weight: 104.3 kg (230 lb)   General: Alert, Awake and Oriented to Time, Place and Person. Appear in moderate distress, affect flat eyes: PERRL, Conjunctiva normal ENT: Oral Mucosa clear moist. Neck: no JVD, no Abnormal Mass Or lumps Cardiovascular: S1 and S2 Present, no Murmur, Peripheral Pulses Present Respiratory: normal respiratory effort, Bilateral Air entry equal and Decreased, no use of accessory  muscle, bilateral basal Crackles, no wheezes Abdomen: Bowel Sound present, Soft and no tenderness, no hernia Skin: no redness, no Rash, non induration Extremities: ono Pedal edema, no calf tenderness Neurologic: Grossly no focal neuro deficit. Bilaterally Equal motor strength  Data Reviewed: CBC: Recent Labs  Lab 11/11/17 1028 11/11/17 1904 11/12/17 0331  WBC 25.2* 35.1* 26.5*  NEUTROABS 23.3*  --   --   HGB 13.7 13.3 11.5*  HCT 42.7 42.0 36.2  MCV 86.1 87.0 87.0  PLT 272 272 249   Basic Metabolic Panel: Recent Labs  Lab 11/11/17 1028 11/11/17 1904 11/12/17 0331  NA 142  --  139  K 3.6  --  3.3*  CL 107  --  106  CO2 23  --  25  GLUCOSE 122*  --  105*  BUN 22  --  32*  CREATININE 1.06* 0.98 1.17*  CALCIUM 9.0  --  8.3*    Liver Function Tests: Recent Labs  Lab 11/11/17 1028  AST 39  ALT 24  ALKPHOS 84  BILITOT 1.3*  PROT 7.8  ALBUMIN 4.0   Recent Labs  Lab 11/11/17 1028  LIPASE 29   No results for input(s): AMMONIA in the last 168 hours. Coagulation Profile: No results for input(s): INR, PROTIME in the last 168 hours. Cardiac Enzymes: No results for input(s): CKTOTAL, CKMB, CKMBINDEX, TROPONINI in the last 168 hours. BNP (last 3 results) No results for input(s): PROBNP in the last 8760 hours. CBG: No results for input(s): GLUCAP in the last 168 hours. Studies: No results found.  Scheduled Meds: . atorvastatin  40 mg Oral Daily  . Chlorhexidine Gluconate Cloth  6 each Topical Q0600  . DULoxetine  60 mg Oral Daily  . enoxaparin (LOVENOX) injection  40 mg Subcutaneous Q24H  . mirabegron ER  25 mg Oral QHS  . mupirocin ointment  1 application Nasal BID  . pantoprazole  40 mg Oral Daily  . pregabalin  150 mg Oral BID  . rOPINIRole  2 mg Oral BID   Continuous Infusions: . sodium chloride 75 mL/hr at 11/12/17 1600  . levofloxacin (LEVAQUIN) IV Stopped (11/12/17 1033)  . vancomycin Stopped (11/12/17 1534)   PRN Meds: acetaminophen **OR**  acetaminophen, HYDROcodone-acetaminophen, ondansetron **OR** ondansetron (ZOFRAN) IV  Time spent: 35 minutes  Author: Lynden Oxford, MD [facility] Pager: [phone] 11/12/2017 6:05 PM  If 7PM-7AM, please contact night-coverage at [email or web address], password [facility]

## 2017-11-12 NOTE — Plan of Care (Signed)
Patient stable during 7 a to 7 p shift, no fever.  Medicated for chronic back pain x 2 with some improvement.  [facility], otherwise unable to convince patient to get up to chair or ambulate d/t her chronic back pain.

## 2017-11-12 NOTE — Progress Notes (Signed)
Pt refusing to come for MBS per travel technician.   SLP phoned her and explained clinical reasoning for evaluation given h/o CVA, risk for silent nature of dysphagia, and recurrent pna.  Pt verbalized understanding to reasoning and states she "still does not want to have it done".  Pt states "I have a right to refuse."  Explained to pt SLP was just educating her to reasoning for test- and advised if pt changes her mind, SLP can be reordered.  SLP to sign off, please reorder if desire.  Thanks.  Heidi Burnetamara Jayven Naill, MS [facility] SLP [phone]

## 2017-11-12 NOTE — Progress Notes (Signed)
Order for swallow eval received.  Pt has h/o severe dysphagia after CVA 07/2017 - therefore in lieu of clinical eval will proceed to MBS.  Spoke to MD and order placed with is agreement. Thanks.  Donavan Burnetamara Anaid Haney, MS [facility]CCC SLP [phone]

## 2017-11-13 ENCOUNTER — Inpatient Hospital Stay (HOSPITAL_COMMUNITY): Payer: Medicare HMO

## 2017-11-13 LAB — CBC
HCT: 38.1 % (ref 36.0–46.0)
Hemoglobin: 11.8 g/dL — ABNORMAL LOW (ref 12.0–15.0)
MCH: 27.1 pg (ref 26.0–34.0)
MCHC: 31 g/dL (ref 30.0–36.0)
MCV: 87.6 fL (ref 78.0–100.0)
PLATELETS: 260 10*3/uL (ref 150–400)
RBC: 4.35 MIL/uL (ref 3.87–5.11)
RDW: 13.7 % (ref 11.5–15.5)
WBC: 11.6 10*3/uL — AB (ref 4.0–10.5)

## 2017-11-13 LAB — URINE CULTURE: Culture: NO GROWTH

## 2017-11-13 LAB — BASIC METABOLIC PANEL
Anion gap: 7 (ref 5–15)
BUN: 22 mg/dL (ref 8–23)
CALCIUM: 8.4 mg/dL — AB (ref 8.9–10.3)
CO2: 26 mmol/L (ref 22–32)
CREATININE: 0.69 mg/dL (ref 0.44–1.00)
Chloride: 107 mmol/L (ref 98–111)
Glucose, Bld: 96 mg/dL (ref 70–99)
Potassium: 4.1 mmol/L (ref 3.5–5.1)
SODIUM: 140 mmol/L (ref 135–145)

## 2017-11-13 LAB — MAGNESIUM: MAGNESIUM: 2.2 mg/dL (ref 1.7–2.4)

## 2017-11-13 MED ORDER — POLYETHYLENE GLYCOL 3350 17 G PO PACK
17.0000 g | PACK | Freq: Every day | ORAL | Status: DC
  Administered 2017-11-13 – 2017-11-14 (×2): 17 g via ORAL
  Filled 2017-11-13 (×2): qty 1

## 2017-11-13 MED ORDER — SENNOSIDES-DOCUSATE SODIUM 8.6-50 MG PO TABS
2.0000 | ORAL_TABLET | Freq: Every day | ORAL | Status: DC
  Administered 2017-11-13: 2 via ORAL
  Filled 2017-11-13: qty 2

## 2017-11-13 NOTE — Evaluation (Signed)
Physical Therapy Evaluation Patient Details Name: Heidi BarmanLisa M Whitsell MRN: [id number] DOB: 08/08/1955 Today's Date: 11/13/2017   History of Present Illness  Pt is a 62 year old female with PMH of HTN, CVA with R sided residual deficits, chronic pain, recent admission for pneumonia; admitted on 11/11/2017 for right lower lobe pneumonia likely due to aspiration  Clinical Impression  Pt admitted with above diagnosis. Pt currently with functional limitations due to the deficits listed below (see PT Problem List).  Pt will benefit from skilled PT to increase their independence and safety with mobility to allow discharge to the venue listed below.  Pt assisted with ambulating short distance within her room, also requested using bathroom while OOB.  Pt reports she has equipment at home and assist at home if needed.    Preactivity: BP 135/80 mmHg During activity: BP: 129/80 mmHg, 68 bpm, SPO2 93% on room air     Follow Up Recommendations Home health PT(would best benefit from OP PT if able to get there)    Equipment Recommendations  None recommended by PT    Recommendations for Other Services       Precautions / Restrictions Precautions Precautions: Fall Restrictions Weight Bearing Restrictions: No      Mobility  Bed Mobility Overal bed mobility: Needs Assistance Bed Mobility: Supine to Sit;Sit to Supine     Supine to sit: Min guard;HOB elevated Sit to supine: Mod assist   General bed mobility comments: pt able to slowly get to EOB, assist for LEs onto bed likely due to fatigue and weakness  Transfers Overall transfer level: Needs assistance Equipment used: Rolling walker (2 wheeled) Transfers: Sit to/from Stand Sit to Stand: Min guard         General transfer comment: verbal cues for hand placement, min/guard for safety, increased time and effort  Ambulation/Gait Ambulation/Gait assistance: Min guard Gait Distance (Feet): 25 Feet Assistive device: Rolling walker (2 wheeled) Gait  Pattern/deviations: Step-to pattern;Decreased dorsiflexion - right;Shuffle;Decreased stance time - right;Decreased weight shift to right     General Gait Details: very slow short steps, pt leans mostly to stronger left side, unable to lift feet from floor - shuffling, reports mild dizziness, generalized weakness  Stairs            Wheelchair Mobility    Modified Rankin (Stroke Patients Only)       Balance Overall balance assessment: Needs assistance         Standing balance support: Bilateral upper extremity supported;During functional activity Standing balance-Leahy Scale: Poor Standing balance comment: pt reports she requires assist for pericare, requires bil UE support                             Pertinent Vitals/Pain Pain Assessment: 0-10 Pain Score: 8  Faces Pain Scale: Hurts little more Pain Location: head Pain Descriptors / Indicators: Aching Pain Intervention(s): Repositioned;Monitored during session;Limited activity within patient's tolerance;Patient requesting pain meds-RN notified    Home Living Family/patient expects to be discharged to:: Private residence Living Arrangements: Spouse/significant other   Type of Home: House       Home Layout: Bed/bath upstairs(has chair lift for stairs) Home Equipment: Walker - 2 wheels;Bedside commode;Wheelchair - manual      Prior Function Level of Independence: Independent with assistive device(s)         Comments: rollator, household ambulator, reports working with PT in the past after CVA, R foot drop present however pt does not  like current AFO so does not wear     Hand Dominance        Extremity/Trunk Assessment   Upper Extremity Assessment Upper Extremity Assessment: RUE deficits/detail RUE Deficits / Details: residual deficits from previous CVA    Lower Extremity Assessment Lower Extremity Assessment: Generalized weakness;RLE deficits/detail RLE Deficits / Details: residual  deficits from previous CVA, foot drop    Cervical / Trunk Assessment Cervical / Trunk Assessment: Kyphotic  Communication   Communication: Expressive difficulties(slow, dysarthric speech)  Cognition Arousal/Alertness: Awake/alert Behavior During Therapy: WFL for tasks assessed/performed Overall Cognitive Status: Within Functional Limits for tasks assessed                                        General Comments      Exercises     Assessment/Plan    PT Assessment Patient needs continued PT services  PT Problem List Decreased strength;Decreased mobility;Decreased activity tolerance;Decreased balance;Decreased knowledge of use of DME       PT Treatment Interventions DME instruction;Gait training;Therapeutic exercise;Therapeutic activities;Functional mobility training;Balance training;Patient/family education;Wheelchair mobility training    PT Goals (Current goals can be found in the Care Plan section)  Acute Rehab PT Goals Patient Stated Goal: go home, get back to her dog PT Goal Formulation: With patient Time For Goal Achievement: 11/27/17 Potential to Achieve Goals: Good    Frequency Min 3X/week   Barriers to discharge        Co-evaluation               AM-PAC PT "6 Clicks" Daily Activity  Outcome Measure Difficulty turning over in bed (including adjusting bedclothes, sheets and blankets)?: A Lot Difficulty moving from lying on back to sitting on the side of the bed? : A Lot Difficulty sitting down on and standing up from a chair with arms (e.g., wheelchair, bedside commode, etc,.)?: Unable Help needed moving to and from a bed to chair (including a wheelchair)?: A Little Help needed walking in hospital room?: A Little Help needed climbing 3-5 steps with a railing? : A Lot 6 Click Score: 13    End of Session Equipment Utilized During Treatment: Gait belt Activity Tolerance: Patient tolerated treatment well Patient left: in bed;with call  bell/phone within reach;with bed alarm set Nurse Communication: Mobility status;Patient requests pain meds PT Visit Diagnosis: Other abnormalities of gait and mobility (R26.89)    Time: 1523-1550 PT Time Calculation (min) (ACUTE ONLY): 27 min   Charges:   PT Evaluation $PT Eval Moderate Complexity: 1 Mod          Zenovia Jarred, PT, DPT 11/13/2017 Pager: [phone]  Maida Sale E 11/13/2017, 4:22 PM

## 2017-11-13 NOTE — Consult Note (Signed)
SLP Note  Patient Details Name: Heidi Green MRN: [id number] DOB: 05/22/1955   Per MD and pt, she is willing to participate in MBS today, but asks if it can be done without the use of barium. Procedure of MBS and need for barium to visualize oropharyngeal swallow on XRay was explained to pt. She is agreeable to proceed, as long as she doesn't have to take "too much". Scheduled with radiology for today. Report to follow:  Joshuan Bolander B. Murvin NatalBueche, [facility]P, CCC-SLP Speech Language Pathologist [phone]  Leigh AuroraBueche, Darrion Wyszynski Brown 11/13/2017, 10:32 AM

## 2017-11-13 NOTE — Consult Note (Signed)
Modified Barium Swallow Progress Note  Patient Details  Name: Heidi BarmanLisa M Green MRN: [id number] Date of Birth: 09/25/1955  Today's Date: 11/13/2017  Modified Barium Swallow completed.  Full report located under Chart Review in the Imaging Section.  Brief recommendations include the following:  Clinical Impression Pt presents with mild oropharyngeal dysphagia, characterized orally by poor mastication (due to missing dentition), and premature spillage over the tongue base. Pharyngeally, pt demonstrates delayed swallow reflex across consistencies, with trigger noted at the pyriform sinus on thin liquids, and at the vallecula on puree and solid consistencies. No post-swallow residue was noted on any consistency. Trace flash penetration was seen during the swallow of large and/or consecutive swallows of thin liquid via cup and straw. No penetration was noted when bolus size and rate was limited to small sips one at a time.   Recommend soft solids (due to poor dentition) and thin liquids via small individual sips (straw or cup). Safe swallow precautions were reviewed with pt, and [facility] will follow for diet tolerance assessment and education.    Swallow Evaluation Recommendations  SLP Diet Recommendations: Dysphagia 3 (Mech soft) solids;Thin liquid   Liquid Administration via: Cup;Straw(one small sip at a time)   Medication Administration: Whole meds with liquid   Supervision: Patient able to self feed   Compensations: Minimize environmental distractions;Slow rate;Small sips/bites   Postural Changes: Seated upright at 90 degrees   Oral Care Recommendations: Oral care BID  Heidi Green, [facility] HospitalMSP, CCC-SLP Speech Language Pathologist [phone]  Heidi Green, Heidi Green 11/13/2017,2:40 PM

## 2017-11-13 NOTE — Progress Notes (Addendum)
[facility] Progress Note  Patient: Heidi Green VHQ:[id number]   PCP: Karle Plumber, MD DOB: [birth year]   DOA: 11/11/2017   DOS: 11/13/2017   Date of Service: the patient was seen and examined on 11/13/2017  Subjective: Feeling better, no nausea no vomiting.  Hypoxic this morning.  Brief hospital course: Pt. with PMH of HTN, CVA, recent admission for pneumonia; admitted on 11/11/2017, presented with complaint of fever and chills, was found to have right lower lobe pneumonia likely aspiration. Currently further plan is continue IV antibiotics.  Assessment and Plan: 1.  Aspiration pneumonia. [facility]iology sepsis resolved. Started on IV vancomycin and Levaquin, MRSA PCR is positive.  Strep antigen is negative. Urine culture negative. Blood culture currently pending. Speech therapy consulted although patient refused MBS.  Now agreeable for MBS with suggest mild aspiration, on soft diet. Most likely this is aspiration pneumonia and the patient who already has history of dysphagia. Condition to Levaquin.  2.  Essential hypertension. Acute kidney injury. Chronic diastolic CHF Lactic acidosis Blood pressure medications are on hold. Gentle IV fluids are given. Monitor volume status while receiving IVF.  3.  Chronic pain syndrome. Patient on home pain medication which is likely contributing to patient's risk for aspiration. Recommend down titration outpatient.  4.  History of CVA with intracranial hemorrhage. Close monitoring for now. Patient is not on any antithrombotic therapy outpatient. Continue Lipitor.  Diet: per speechtherapy DVT Prophylaxis: subcutaneous Heparin  Advance goals of care discussion: full code  Family Communication: no family was present at bedside, at the time of interview.   Disposition:  Discharge to home, likely tomorrow.  Consultants: none Procedures: none  Antibiotics: Anti-infectives (From admission, onward)   Start     Dose/Rate Route  Frequency Ordered Stop   11/12/17 1300  vancomycin (VANCOCIN) 1,250 mg in sodium chloride 0.9 % 250 mL IVPB  Status:  Discontinued     1,250 mg 166.7 mL/hr over 90 Minutes Intravenous Every 24 hours 11/11/17 1734 11/13/17 0931   11/12/17 1000  levofloxacin (LEVAQUIN) IVPB 750 mg     750 mg 100 mL/hr over 90 Minutes Intravenous Every 24 hours 11/11/17 1733     11/11/17 0945  vancomycin (VANCOCIN) 2,000 mg in sodium chloride 0.9 % 500 mL IVPB     2,000 mg 250 mL/hr over 120 Minutes Intravenous  Once 11/11/17 0942 11/11/17 1848   11/11/17 0930  levofloxacin (LEVAQUIN) IVPB 750 mg     750 mg 100 mL/hr over 90 Minutes Intravenous  Once 11/11/17 0926 11/11/17 1413   11/11/17 0930  aztreonam (AZACTAM) 2 g in sodium chloride 0.9 % 100 mL IVPB     2 g 200 mL/hr over 30 Minutes Intravenous  Once 11/11/17 0926 11/11/17 1139   11/11/17 0930  vancomycin (VANCOCIN) IVPB 1000 mg/200 mL premix  Status:  Discontinued     1,000 mg 200 mL/hr over 60 Minutes Intravenous  Once 11/11/17 0926 11/11/17 0942       Objective: Physical Exam: Vitals:   11/12/17 2129 11/13/17 0514 11/13/17 0531 11/13/17 1717  BP:  (!) 174/86  (!) 148/76  Pulse: 82 79  66  Resp:  20  20  Temp:  98.2 F (36.8 C)    TempSrc:  Oral    SpO2: 93% (!) 89% 93% 96%  Weight:      Height:        Intake/Output Summary (Last 24 hours) at 11/13/2017 1855 Last data filed at 11/13/2017 1400 Gross per 24 hour  Intake 1318.22 ml  Output 1000 ml  Net 318.22 ml   Filed Weights   11/11/17 1013  Weight: 104.3 kg (230 lb)   General: Alert, Awake and Oriented to Time, Place and Person. Appear in moderate distress, affect flat eyes: PERRL, Conjunctiva normal ENT: Oral Mucosa clear moist. Neck: no JVD, no Abnormal Mass Or lumps Cardiovascular: S1 and S2 Present, no Murmur, Peripheral Pulses Present Respiratory: normal respiratory effort, Bilateral Air entry equal and Decreased, no use of accessory muscle, bilateral basal Crackles, no  wheezes Abdomen: Bowel Sound present, Soft and no tenderness, no hernia Skin: no redness, no Rash, non induration Extremities: ono Pedal edema, no calf tenderness Neurologic: Grossly no focal neuro deficit. Bilaterally Equal motor strength  Data Reviewed: CBC: Recent Labs  Lab 11/11/17 1028 11/11/17 1904 11/12/17 0331 11/13/17 0500  WBC 25.2* 35.1* 26.5* 11.6*  NEUTROABS 23.3*  --   --   --   HGB 13.7 13.3 11.5* 11.8*  HCT 42.7 42.0 36.2 38.1  MCV 86.1 87.0 87.0 87.6  PLT 272 272 249 260   Basic Metabolic Panel: Recent Labs  Lab 11/11/17 1028 11/11/17 1904 11/12/17 0331 11/13/17 0500  NA 142  --  139 140  K 3.6  --  3.3* 4.1  CL 107  --  106 107  CO2 23  --  25 26  GLUCOSE 122*  --  105* 96  BUN 22  --  32* 22  CREATININE 1.06* 0.98 1.17* 0.69  CALCIUM 9.0  --  8.3* 8.4*  MG  --   --   --  2.2    Liver Function Tests: Recent Labs  Lab 11/11/17 1028  AST 39  ALT 24  ALKPHOS 84  BILITOT 1.3*  PROT 7.8  ALBUMIN 4.0   Recent Labs  Lab 11/11/17 1028  LIPASE 29   No results for input(s): AMMONIA in the last 168 hours. Coagulation Profile: No results for input(s): INR, PROTIME in the last 168 hours. Cardiac Enzymes: No results for input(s): CKTOTAL, CKMB, CKMBINDEX, TROPONINI in the last 168 hours. BNP (last 3 results) No results for input(s): PROBNP in the last 8760 hours. CBG: No results for input(s): GLUCAP in the last 168 hours. Studies: Dg Swallowing Func-speech Pathology  Result Date: 11/13/2017 Objective Swallowing Evaluation: Type of Study: MBS-Modified Barium Swallow Study  Patient Details Name: Heidi Green MRN: [id number] Date of Birth: 07/08/1955 Today's Date: 11/13/2017 Time: SLP Start Time (ACUTE ONLY): 1400 -SLP Stop Time (ACUTE ONLY): 1430 SLP Time Calculation (min) (ACUTE ONLY): 30 min Past Medical History: Past Medical History: Diagnosis Date . Arthritis   in back . Back pain  . Chest pain 06/16/2012  negative cath with normal LV function .  Headache(784.0)  . Hypertension  . 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 Kidney stones  . Stroke [facility](HCC)  Past Surgical History: Past Surgical History: Procedure Laterality Date . ABDOMINAL HYSTERECTOMY   . BACK SURGERY    x 3 . LEFT HEART CATHETERIZATION WITH CORONARY ANGIOGRAM N/A 06/16/2012  Procedure: LEFT HEART CATHETERIZATION WITH CORONARY ANGIOGRAM;  Surgeon: Peter M SwazilandJordan, MD;  [facility];  Service: Cardiovascular;  Laterality: N/A; . LITHOTRIPSY   . TUBAL LIGATION   HPI: Heidi BarmanLisa M Plate is a 62 y.o. female with medical history of HTN who was recently admitted to the hospital for CAP from 6/22-6/23 to the internal medicine teaching service for CAP (RML) and d/c'd with a total of 5 day  course of Levaquin. Now return with recurrent pna.  PMH + for CVA with worsening right sided weakness 06/2017.  Pt underwent MBS 07/29/2016 after left basal ganglia cva with findings of severe dysphagia exacerbated by fatigue and AMS.  Pt was recommended to be npo at that time.  Pt denies dysphagia to MD but MD concerned for aspiration due to leukocytis and findings of imaging studies.  Determined to proceed with MBS given neuro hx and possible sensorimotor deficits.   Subjective: Pt seen in radiology for MBS Assessment / Plan / Recommendation CHL IP CLINICAL IMPRESSIONS 11/13/2017 Clinical Impression Pt presents with mild oropharyngeal dysphagia, characterized orally by poor mastication (due to missing dentition), and premature spillage over the tongue base. Pharyngeally, pt demonstrates delayed swallow reflex across consistencies, with trigger noted at the pyriform sinus on thin liquids, and at the vallecula on puree and solid consistencies. No post-swallow residue was noted on any consistency. Trace flash penetration was seen during the swallow of large and/or consecutive swallows of thin liquid via cup and straw. No penetration was noted when bolus size and rate was limited to small  sips one at a time. Recommend soft solids (due to poor dentition) and thin liquids via small individual sips (straw or cup). Safe swallow precautions were reviewed with pt, and sent in written form to be [facility] at [facility]. ST will follow for diet tolerance assessment and education.  SLP Visit Diagnosis Dysphagia, oropharyngeal phase (R13.12) Impact on safety and function Mild aspiration risk   CHL IP TREATMENT RECOMMENDATION 11/13/2017 Treatment Recommendations Therapy as outlined in treatment plan below   Prognosis 11/13/2017 Prognosis for Safe Diet Advancement Good CHL IP DIET RECOMMENDATION 11/13/2017 SLP Diet Recommendations Dysphagia 3 (Mech soft) solids;Thin liquid Liquid Administration via Cup;Straw Medication Administration Whole meds with liquid Compensations Minimize environmental distractions;Slow rate;Small sips/bites Postural Changes Seated upright at 90 degrees Stay at 45 degrees at least 30 minutes after meals   CHL IP OTHER RECOMMENDATIONS 11/13/2017 Oral Care Recommendations Oral care BID     CHL IP FREQUENCY AND DURATION 11/13/2017 Speech Therapy Frequency (ACUTE ONLY) min 1 x/week Treatment Duration 1 week      CHL IP ORAL PHASE 11/13/2017 Oral Phase Impaired Oral - Thin Cup Premature spillage Oral - Thin Straw Premature spillage Oral - Puree Premature spillage Oral - Regular Impaired mastication  CHL IP PHARYNGEAL PHASE 11/13/2017 Pharyngeal Phase Impaired Pharyngeal- Thin Cup Delayed swallow initiation-pyriform sinuses;Reduced airway/laryngeal closure;Penetration/Aspiration during swallow Pharyngeal Material enters airway, remains ABOVE vocal cords then ejected out Pharyngeal- Thin Straw Delayed swallow initiation-pyriform sinuses;Reduced airway/laryngeal closure;Penetration/Aspiration during swallow Pharyngeal Material enters airway, remains ABOVE vocal cords then ejected out Pharyngeal- Puree Delayed swallow initiation-vallecula Pharyngeal- Regular Delayed swallow initiation-vallecula  CHL IP CERVICAL  ESOPHAGEAL PHASE 11/13/2017 Cervical Esophageal Phase [facility] Celia B. Murvin Natal [facility], CCC-SLP Speech Language Pathologist [phone] Leigh Aurora 11/13/2017, 2:36 PM               Scheduled Meds: . atorvastatin  40 mg Oral Daily  . Chlorhexidine Gluconate Cloth  6 each Topical Q0600  . DULoxetine  60 mg Oral Daily  . enoxaparin (LOVENOX) injection  40 mg Subcutaneous Q24H  . mirabegron ER  25 mg Oral QHS  . mupirocin ointment  1 application Nasal BID  . pantoprazole  40 mg Oral Daily  . polyethylene glycol  17 g Oral Daily  . pregabalin  150 mg Oral BID  . rOPINIRole  2 mg Oral BID  . senna-docusate  2 tablet Oral QHS  Continuous Infusions: . levofloxacin (LEVAQUIN) IV 750 mg (11/13/17 1231)   PRN Meds: acetaminophen **OR** acetaminophen, HYDROcodone-acetaminophen, ondansetron **OR** ondansetron (ZOFRAN) IV  Time spent: 35 minutes  Author: Lynden Oxford, MD [facility] Pager: [phone] 11/13/2017 6:55 PM  If 7PM-7AM, please contact night-coverage at [email or web address], password [email or web address]

## 2017-11-14 LAB — CBC
HCT: 41 % (ref 36.0–46.0)
Hemoglobin: 12.8 g/dL (ref 12.0–15.0)
MCH: 27.4 pg (ref 26.0–34.0)
MCHC: 31.2 g/dL (ref 30.0–36.0)
MCV: 87.6 fL (ref 78.0–100.0)
Platelets: 257 10*3/uL (ref 150–400)
RBC: 4.68 MIL/uL (ref 3.87–5.11)
RDW: 13.3 % (ref 11.5–15.5)
WBC: 8 10*3/uL (ref 4.0–10.5)

## 2017-11-14 LAB — BASIC METABOLIC PANEL
ANION GAP: 7 (ref 5–15)
BUN: 15 mg/dL (ref 8–23)
CALCIUM: 8.8 mg/dL — AB (ref 8.9–10.3)
CHLORIDE: 104 mmol/L (ref 98–111)
CO2: 28 mmol/L (ref 22–32)
CREATININE: 0.76 mg/dL (ref 0.44–1.00)
GFR calc Af Amer: >60 mL/min (ref >60)
GFR calc non Af Amer: >60 mL/min (ref >60)
GLUCOSE: 93 mg/dL (ref 70–99)
Potassium: 4.3 mmol/L (ref 3.5–5.1)
Sodium: 139 mmol/L (ref 135–145)

## 2017-11-14 MED ORDER — LEVOFLOXACIN 750 MG PO TABS: 750.0000 mg | ORAL_TABLET | Freq: Every day | ORAL | 0 refills | Status: DC

## 2017-11-14 MED ORDER — POLYETHYLENE GLYCOL 3350 17 G PO PACK: 17.0000 g | PACK | Freq: Every day | ORAL | 0 refills | Status: DC

## 2017-11-14 NOTE — Progress Notes (Signed)
Pt selected [facility] dfor HHPT. Referral given to in house rep.

## 2017-11-14 NOTE — Discharge Summary (Addendum)
[facility] Discharge Summary   Patient: Heidi Green ZOX:[id number]   PCP: Karle Plumber, MD DOB: [birth year]   Date of admission: 11/11/2017   Date of discharge:  11/14/2017    Discharge Diagnoses:  Principal Problem:   Severe sepsis (HCC) Active Problems:   HTN (hypertension)   Class 2 obesity   HCAP (healthcare-associated pneumonia)   Acute lower UTI   AKI (acute kidney injury) (HCC)   Lactic acid acidosis   Admitted From: home Disposition:  home  Recommendations for Outpatient Follow-up:  1. Please follow up with PCP in1 week   Follow-up Information    [facility].   Specialty:  Home Health Services Why:  [facility] will follow you at home for HHPT. Contact information: [address] [place] [address] [phone]        Karle Plumber, MD. Schedule an appointment as soon as possible for a visit in 1 week(s).   Specialty:  Internal Medicine Contact information: [phone] [place] [address] [phone]          Diet recommendation: cardiac diet  Activity: The patient is advised to gradually reintroduce usual activities.  Discharge Condition: good  Code Status: full code  History of present illness: As per the H and P dictated on admission, "Heidi Green is a 62 y.o. female with medical history of HTN who was recently admitted to the hospital for CAP from 6/22-6/23 to the internal medicine teaching service for CAP (RML) and d/c'd with a total of 5 day course of Levaquin.  She returns due to fever of 102. She has been on Macrobid for a UTI. In the ED, she is found on imaging to have a RLL infiltrate. She has a mildly low  Pulse ox but has not had any respiratory symptoms. She does not choke on her food. No recent vomiting. "  Hospital Course:  Summary of her active problems in the hospital is as following. 1.  Aspiration pneumonia. sepsis Physiology resolved. Started on IV vancomycin and Levaquin, MRSA PCR is  positive.  Strep antigen is negative. Urine culture negative. Blood culture no growth to date. Speech therapy consulted although patient initially refused MBS, later on was agreeable for MBS with suggest mild aspiration, on soft diet. Most likely this is aspiration pneumonia and the patient who already has history of dysphagia. Speech therapy consulted, recommended dysphagia 3 diet.  Continue Levaquin.  2.  Essential hypertension. Acute kidney injury. Chronic diastolic CHF Lactic acidosis OSA Blood pressure medications are on hold in hospital, resume at home Gentle IV fluids were given. AKI Resolved with fluids Pt is hypoxic likely combination of chronic CHF and acute pneumonia.  Also suspect that the pt has diagnosed and untreated OSA which has lead to worsening hypoxia.   3.  Chronic pain syndrome. Patient on home pain medication which is likely contributing to patient's risk for aspiration. Recommend down titration outpatient.  4.  History of CVA with intracranial hemorrhage. Close monitoring for now. Patient is not on any antithrombotic therapy outpatient. Likely due to ICH.  Continue Lipitor.  All other chronic medical condition were stable during the hospitalization.  Patient was seen by physical therapy, who recommended home health, which was arranged by Child psychotherapist and case Production designer, theatre/television/film. On the day of the discharge the patient's vitals were stable , and no other acute medical condition were reported by patient. the patient was felt safe to be discharge at  home with home health.  Consultants: none Procedures: none  DISCHARGE MEDICATION: Allergies as of 11/14/2017      Reactions   Garamycin [gentamicin Sulfate] Hives, Shortness Of Breath   Other Anaphylaxis   HORSE SERUM   Penicillins Anaphylaxis, Itching, Swelling   Has patient had a PCN reaction causing immediate rash, facial/tongue/throat swelling, SOB or lightheadedness with hypotension: Yes Has patient had a PCN  reaction causing severe rash involving mucus membranes or skin necrosis: No Has patient had a PCN reaction that required hospitalization No Has patient had a PCN reaction occurring within the last 10 years: No If all of the above answers are "NO", then may proceed with Cephalosporin use. Reports taking amoxicillin after PCN rxn   Norvasc [amlodipine Besylate] Nausea And Vomiting      Medication List    STOP taking these medications   nitrofurantoin (macrocrystal-monohydrate) 100 MG capsule Commonly known as:  MACROBID     TAKE these medications   acetaminophen 325 MG tablet Commonly known as:  TYLENOL Take 2 tablets (650 mg total) by mouth every 4 (four) hours as needed for mild pain (or temp > 37.5 C (99.5 F)). Combination of acetaminophen in vicodin and tylenol should not exceed max dose of 4000mg  daily   atorvastatin 40 MG tablet Commonly known as:  LIPITOR Take 40 mg by mouth daily.   diclofenac 50 MG EC tablet Commonly known as:  VOLTAREN TAKE 1 TABLET PO TWICE A DAY   DULoxetine 60 MG capsule Commonly known as:  CYMBALTA TAKE 1 CAPSULE PO AT BEDITME   esomeprazole 20 MG capsule Commonly known as:  NEXIUM Take 20 mg by mouth daily as needed (GERD).   HYDROcodone-acetaminophen 7.5-325 MG tablet Commonly known as:  NORCO Take 1 tablet by mouth every 6 (six) hours as needed (pain).   levofloxacin 750 MG tablet Commonly known as:  LEVAQUIN Take 1 tablet (750 mg total) by mouth daily.   lisinopril 20 MG tablet Commonly known as:  PRINIVIL,ZESTRIL 20 mg daily.   LYRICA 150 MG capsule Generic drug:  pregabalin Take 150 mg by mouth 2 (two) times daily.   mirabegron ER 25 MG Tb24 tablet Commonly known as:  MYRBETRIQ Take 25 mg by mouth at bedtime.   polyethylene glycol packet Commonly known as:  MIRALAX / GLYCOLAX Take 17 g by mouth daily.   rOPINIRole 2 MG tablet Commonly known as:  REQUIP Take 2 mg by mouth 2 (two) times daily.   senna-docusate 8.6-50 MG  tablet Commonly known as:  Senokot-S Take 1 tablet by mouth 2 (two) times daily.   Vitamin D (Ergocalciferol) 50000 units Caps capsule Commonly known as:  DRISDOL Take 50,000 Units by mouth once a week.            Durable Medical Equipment  (From admission, onward)        Start     Ordered   11/14/17 1200  For home use only DME oxygen  Once    Question Answer Comment  Mode or (Route) Nasal cannula   Liters per Minute 2   Frequency Continuous (stationary and portable oxygen unit needed)   Oxygen delivery system Gas      11/14/17 1159     Allergies  Allergen Reactions  . Garamycin [Gentamicin Sulfate] Hives and Shortness Of Breath  . Other Anaphylaxis    HORSE SERUM  . Penicillins Anaphylaxis, Itching and Swelling    Has patient had a PCN reaction causing immediate rash, facial/tongue/throat swelling, SOB or  lightheadedness with hypotension: Yes Has patient had a PCN reaction causing severe rash involving mucus membranes or skin necrosis: No Has patient had a PCN reaction that required hospitalization No Has patient had a PCN reaction occurring within the last 10 years: No If all of the above answers are "NO", then may proceed with Cephalosporin use. Reports taking amoxicillin after PCN rxn  . Norvasc [Amlodipine Besylate] Nausea And Vomiting   Discharge Instructions    Diet - low sodium heart healthy   Complete by:  As directed    Discharge instructions   Complete by:  As directed    It is important that you read following instructions as well as go over your medication list with RN to help you understand your care after this hospitalization.  Discharge Instructions: Please follow-up with PCP in one week  Please request your primary care physician to go over all Hospital Tests and Procedure/Radiological results at the follow up,  Please get all Hospital records sent to your PCP by signing hospital release before you go home.   Do not take more than prescribed  Pain, Sleep and Anxiety Medications. You were cared for by a hospitalist during your hospital stay. If you have any questions about your discharge medications or the care you received while you were in the hospital after you are discharged, you can call the unit and ask to speak with the hospitalist on call if the hospitalist that took care of you is not available.  Once you are discharged, your primary care physician will handle any further medical issues. Please note that NO REFILLS for any discharge medications will be authorized once you are discharged, as it is imperative that you return to your primary care physician (or establish a relationship with a primary care physician if you do not have one) for your aftercare needs so that they can reassess your need for medications and monitor your lab values. You Must read complete instructions/literature along with all the possible adverse reactions/side effects for all the Medicines you take and that have been prescribed to you. Take any new Medicines after you have completely understood and accept all the possible adverse reactions/side effects. Wear Seat belts while driving. If you have smoked or chewed Tobacco in the last 2 yrs please stop smoking and/or stop any Recreational drug use.   Increase activity slowly   Complete by:  As directed      Discharge Exam: Filed Weights   11/11/17 1013  Weight: 104.3 kg (230 lb)   Vitals:   11/13/17 2048 11/14/17 0512  BP: (!) 144/74 (!) 152/83  Pulse: 68 67  Resp: 18 16  Temp: 98.9 F (37.2 C) 98.6 F (37 C)  SpO2: 93% 94%   General: Appear in no distress, no Rash; Oral Mucosa moist. Cardiovascular: S1 and S2 Present, no Murmur, no JVD Respiratory: Bilateral Air entry present and basal Crackles, no wheezes Abdomen: Bowel Sound present, Soft and no tenderness Extremities: trave Pedal edema, no calf tenderness Neurology: Grossly no focal neuro deficit.  The results of significant diagnostics  from this hospitalization (including imaging, microbiology, ancillary and laboratory) are listed below for reference.    Significant Diagnostic Studies: Ct Chest W Contrast  Result Date: 11/11/2017 CLINICAL DATA:  62 y/o  F; fever, headache, weakness. EXAM: CT CHEST, ABDOMEN, AND PELVIS WITH CONTRAST TECHNIQUE: Multidetector CT imaging of the chest, abdomen and pelvis was performed following the standard protocol during bolus administration of intravenous contrast. CONTRAST:  ISOVUE-300 IOPAMIDOL (  ISOVUE-300) INJECTION 61% COMPARISON:  02/28/2008 CT abdomen and pelvis. 11/11/2017 chest radiograph. FINDINGS: CT CHEST FINDINGS Cardiovascular: No significant vascular findings. Normal heart size. No pericardial effusion. Calcific aortic atherosclerosis. Mediastinum/Nodes: No enlarged mediastinal, hilar, or axillary lymph nodes. Thyroid gland, trachea, and esophagus demonstrate no significant findings. Lungs/Pleura: Peribronchial thickening, scattered mucous plugging, clustered ground-glass nodules in bronchovascular distribution predominantly within the dependent right lung and left posterior lung base with small areas of consolidation compatible with acute bronchopneumonia. No pleural effusion or pneumothorax. Additional mild platelike atelectasis in the lungs bilaterally. Musculoskeletal: No chest wall mass or suspicious bone lesions identified. CT ABDOMEN PELVIS FINDINGS Hepatobiliary: Hepatic steatosis no focal liver abnormality is seen. No gallstones, gallbladder wall thickening, or biliary dilatation. Pancreas: Unremarkable. No pancreatic ductal dilatation or surrounding inflammatory changes. Spleen: Normal in size without focal abnormality. Adrenals/Urinary Tract: Right kidney upper and lower pole cysts measuring up to 27 mm. No additional focal kidney lesion. No urinary stone disease or hydronephrosis. Normal adrenal glands. Normal bladder. Stomach/Bowel: Stomach is within normal limits. Appendix  appears normal. No evidence of bowel wall thickening, distention, or inflammatory changes. 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 Infrarenal abdominal aorta measures up to 24 mm. Calcific aortic atherosclerosis. Reproductive: Status post hysterectomy. No adnexal masses. Other: No abdominal wall hernia or abnormality. No abdominopelvic ascites. Musculoskeletal: No acute or significant osseous findings. Mild S-shaped curvature of the spine and multilevel degenerative changes greatest at the L3-S1 levels. IMPRESSION: 1. Acute bronchopneumonia greatest in the dependent right lung. 2. Hepatic steatosis. 3. 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 4. Aortic atherosclerosis. Electronically Signed   By: Mitzi Hansen M.D.   On: 11/11/2017 13:38   Ct Abdomen Pelvis W Contrast  Result Date: 11/11/2017 CLINICAL DATA:  62 y/o  F; fever, headache, weakness. EXAM: CT CHEST, ABDOMEN, AND PELVIS WITH CONTRAST TECHNIQUE: Multidetector CT imaging of the chest, abdomen and pelvis was performed following the standard protocol during bolus administration of intravenous contrast. CONTRAST:  ISOVUE-300 IOPAMIDOL (ISOVUE-300) INJECTION 61% COMPARISON:  02/28/2008 CT abdomen and pelvis. 11/11/2017 chest radiograph. FINDINGS: CT CHEST FINDINGS Cardiovascular: No significant vascular findings. Normal heart size. No pericardial effusion. Calcific aortic atherosclerosis. Mediastinum/Nodes: No enlarged mediastinal, hilar, or axillary lymph nodes. Thyroid gland, trachea, and esophagus demonstrate no significant findings. Lungs/Pleura: Peribronchial thickening, scattered mucous plugging, clustered ground-glass nodules in bronchovascular distribution predominantly within the dependent right lung and left posterior lung base with small areas of consolidation  compatible with acute bronchopneumonia. No pleural effusion or pneumothorax. Additional mild platelike atelectasis in the lungs bilaterally. Musculoskeletal: No chest wall mass or suspicious bone lesions identified. CT ABDOMEN PELVIS FINDINGS Hepatobiliary: Hepatic steatosis no focal liver abnormality is seen. No gallstones, gallbladder wall thickening, or biliary dilatation. Pancreas: Unremarkable. No pancreatic ductal dilatation or surrounding inflammatory changes. Spleen: Normal in size without focal abnormality. Adrenals/Urinary Tract: Right kidney upper and lower pole cysts measuring up to 27 mm. No additional focal kidney lesion. No urinary stone disease or hydronephrosis. Normal adrenal glands. Normal bladder. Stomach/Bowel: Stomach is within normal limits. Appendix appears normal. No evidence of bowel wall thickening, distention, or inflammatory changes. 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 Infrarenal abdominal aorta measures up to 24 mm. Calcific aortic atherosclerosis. Reproductive: Status post hysterectomy. No adnexal masses. Other: No abdominal wall  hernia or abnormality. No abdominopelvic ascites. Musculoskeletal: No acute or significant osseous findings. Mild S-shaped curvature of the spine and multilevel degenerative changes greatest at the L3-S1 levels. IMPRESSION: 1. Acute bronchopneumonia greatest in the dependent right lung. 2. Hepatic steatosis. 3. 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 4. Aortic atherosclerosis. Electronically Signed   By: Mitzi Hansen M.D.   On: 11/11/2017 13:38   Dg Chest Port 1 View  Result Date: 11/11/2017 CLINICAL DATA:  Weakness, fever, shortness of breath EXAM: PORTABLE CHEST 1 VIEW COMPARISON:  10/05/2017 FINDINGS: Cardiomegaly with vascular congestion. Mild elevation of the right  hemidiaphragm. Right basilar atelectasis or infiltrate. No confluent opacity on the left. No effusions. IMPRESSION: Cardiomegaly, vascular congestion. Right basilar atelectasis or consolidation. Electronically Signed   By: Charlett Nose M.D.   On: 11/11/2017 11:12   Dg Swallowing Func-speech Pathology  Result Date: 11/13/2017 Objective Swallowing Evaluation: Type of Study: MBS-Modified Barium Swallow Study  Patient Details Name: Heidi Green MRN: [id number] Date of Birth: 10/17/1955 Today's Date: 11/13/2017 Time: SLP Start Time (ACUTE ONLY): 1400 -SLP Stop Time (ACUTE ONLY): 1430 SLP Time Calculation (min) (ACUTE ONLY): 30 min Past Medical History: Past Medical History: Diagnosis Date . Arthritis   in back . Back pain  . Chest pain 06/16/2012  negative cath with normal LV function . Headache(784.0)  . Hypertension  . 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 Kidney stones  . Stroke [facility])  Past Surgical History: Past Surgical History: Procedure Laterality Date . ABDOMINAL HYSTERECTOMY   . BACK SURGERY    x 3 . LEFT HEART CATHETERIZATION WITH CORONARY ANGIOGRAM N/A 06/16/2012  Procedure: LEFT HEART CATHETERIZATION WITH CORONARY ANGIOGRAM;  Surgeon: Peter M Swaz[place], MD;  Location: [facility] CATH LAB;  Service: Cardiovascular;  Laterality: N/A; . LITHOTRIPSY   . TUBAL LIGATION   HPI: Heidi Green is a 62 y.o. female with medical history of HTN who was recently admitted to the hospital for CAP from 6/22-6/23 to the internal medicine teaching service for CAP (RML) and d/c'd with a total of 5 day course of Levaquin. Now return with recurrent pna.  PMH + for CVA with worsening right sided weakness 06/2017.  Pt underwent MBS 07/29/2016 after left basal ganglia cva with findings of severe dysphagia exacerbated by fatigue and AMS.  Pt was recommended to be npo at that time.  Pt denies dysphagia to MD but MD concerned for aspiration due to leukocytis and findings of imaging studies.  Determined to proceed  with MBS given neuro hx and possible sensorimotor deficits.   Subjective: Pt seen in radiology for MBS Assessment / Plan / Recommendation CHL IP CLINICAL IMPRESSIONS 11/13/2017 Clinical Impression Pt presents with mild oropharyngeal dysphagia, characterized orally by poor mastication (due to missing dentition), and premature spillage over the tongue base. Pharyngeally, pt demonstrates delayed swallow reflex across consistencies, with trigger noted at the pyriform sinus on thin liquids, and at the vallecula on puree and solid consistencies. No post-swallow residue was noted on any consistency. Trace flash penetration was seen during the swallow of large and/or consecutive swallows of thin liquid via cup and straw. No penetration was noted when bolus size and rate was limited to small sips one at a time. Recommend soft solids (due to poor dentition) and thin liquids via small individual sips (straw or cup). Safe swallow precautions were reviewed with pt, and sent in written form to be placed at [facility]. ST  will follow for diet tolerance assessment and education.  SLP Visit Diagnosis Dysphagia, oropharyngeal phase (R13.12) Impact on safety and function Mild aspiration risk   CHL IP TREATMENT RECOMMENDATION 11/13/2017 Treatment Recommendations Therapy as outlined in treatment plan below   Prognosis 11/13/2017 Prognosis for Safe Diet Advancement Good CHL IP DIET RECOMMENDATION 11/13/2017 SLP Diet Recommendations Dysphagia 3 (Mech soft) solids;Thin liquid Liquid Administration via Cup;Straw Medication Administration Whole meds with liquid Compensations Minimize environmental distractions;Slow rate;Small sips/bites Postural Changes Seated upright at 90 degrees Stay at 45 degrees at least 30 minutes after meals   CHL IP OTHER RECOMMENDATIONS 11/13/2017 Oral Care Recommendations Oral care BID     CHL IP FREQUENCY AND DURATION 11/13/2017 Speech Therapy Frequency (ACUTE ONLY) min 1 x/week Treatment Duration 1 week      CHL IP ORAL PHASE  11/13/2017 Oral Phase Impaired Oral - Thin Cup Premature spillage Oral - Thin Straw Premature spillage Oral - Puree Premature spillage Oral - Regular Impaired mastication  CHL IP PHARYNGEAL PHASE 11/13/2017 Pharyngeal Phase Impaired Pharyngeal- Thin Cup Delayed swallow initiation-pyriform sinuses;Reduced airway/laryngeal closure;Penetration/Aspiration during swallow Pharyngeal Material enters airway, remains ABOVE vocal cords then ejected out Pharyngeal- Thin Straw Delayed swallow initiation-pyriform sinuses;Reduced airway/laryngeal closure;Penetration/Aspiration during swallow Pharyngeal Material enters airway, remains ABOVE vocal cords then ejected out Pharyngeal- Puree Delayed swallow initiation-vallecula Pharyngeal- Regular Delayed swallow initiation-vallecula  CHL IP CERVICAL ESOPHAGEAL PHASE 11/13/2017 Cervical Esophageal Phase [facility] Celia B. Murvin NatalBueche, [facility] Center[facility]P, CCC-SLP Speech Language Pathologist [phone] Leigh AuroraBueche, Celia Brown 11/13/2017, 2:36 PM               Microbiology: Recent Results (from the past 240 hour(s))  Blood Culture (routine x 2)     Status: None (Preliminary result)   Collection Time: 11/11/17 10:28 AM  Result Value Ref Range Status   Specimen Description   Final    BLOOD LEFT ANTECUBITAL Performed at [facility], [address] [place], [place] [address]    Special Requests   Final    BOTTLES DRAWN AEROBIC AND ANAEROBIC Blood Culture adequate volume Performed at [facility], [address] [place], [place] [address]    Culture   Final    NO GROWTH 3 DAYS Performed at [facility], [address] [place], [place] [address]    Report Status PENDING  Incomplete  Blood Culture (routine x 2)     Status: None (Preliminary result)   Collection Time: 11/11/17  7:04 PM  Result Value Ref Range Status   Specimen Description BLOOD BLOOD LEFT HAND  Final   Special Requests   Final    AEROBIC BOTTLE ONLY Blood Culture results may not be optimal due to  an inadequate volume of blood received in culture bottles   Culture   Final    NO GROWTH 2 DAYS Performed at [facility], [address] [place], [place] [address]    Report Status PENDING  Incomplete  Urine culture     Status: None   Collection Time: 11/11/17  7:08 PM  Result Value Ref Range Status   Specimen Description   Final    URINE, CLEAN CATCH Performed at [facility], [address] [place], [place] [address]    Special Requests   Final    NONE Performed at [facility], [address] [place], [place] [address]    Culture   Final    NO GROWTH Performed at [facility], [address]  [place], [place] [address]    Report Status 11/13/2017 FINAL  Final  MRSA PCR Screening     Status: Abnormal   Collection Time: 11/11/17  7:09 PM  Result Value Ref Range Status   MRSA by PCR POSITIVE (A) NEGATIVE Final    Comment:        The GeneXpert MRSA Assay (FDA approved for NASAL specimens only), is one component of a comprehensive MRSA colonization surveillance program. It is not intended to diagnose MRSA infection nor to guide or monitor treatment for MRSA infections. RESULT CALLED TO, READ BACK BY AND VERIFIED WITH: Judie Petit SPECMENS RN [id number] 11/11/17 A NAVARRO Performed at [facility], [address] [place], [place] [address]   Culture, blood (routine x 2) Call MD if unable to obtain prior to antibiotics being given     Status: None (Preliminary result)   Collection Time: 11/11/17  7:11 PM  Result Value Ref Range Status   Specimen Description   Final    BLOOD RIGHT HAND Performed at [facility], [address] [place], [place] [address]    Special Requests   Final    AEROBIC BOTTLE ONLY Blood Culture adequate volume Performed at [facility], [address] [place], [place] [address]    Culture   Final    NO GROWTH 2 DAYS Performed at [facility], [address], [place], [place] [address]    Report Status PENDING  Incomplete     Labs: CBC: Recent Labs  Lab 11/11/17 1028 11/11/17 1904 11/12/17 0331 11/13/17 0500 11/14/17 0423  WBC 25.2* 35.1* 26.5* 11.6* 8.0  NEUTROABS 23.3*  --   --   --   --   HGB 13.7 13.3 11.5* 11.8* 12.8  HCT 42.7 42.0 36.2 38.1 41.0  MCV 86.1 87.0 87.0 87.6 87.6  PLT 272 272 249 260 257   Basic Metabolic Panel: Recent Labs  Lab 11/11/17 1028 11/11/17 1904 11/12/17 0331 11/13/17 0500 11/14/17 0423  NA 142  --  139 140 139  K 3.6  --  3.3* 4.1 4.3  CL 107  --  106 107 104  CO2 23  --  25 26 28   GLUCOSE 122*  --  105* 96 93  BUN 22  --  32* 22 15  CREATININE 1.06* 0.98 1.17* 0.69 0.76  CALCIUM 9.0  --  8.3* 8.4* 8.8*  MG  --   --   --  2.2  --    Liver Function Tests: Recent Labs  Lab 11/11/17 1028  AST 39  ALT 24  ALKPHOS 84  BILITOT 1.3*  PROT 7.8  ALBUMIN 4.0   Recent Labs  Lab 11/11/17 1028  LIPASE 29   Time spent: 35 minutes  Signed:  Lynden Oxford  [facility]  11/14/2017  , 3:09 PM

## 2017-11-14 NOTE — Care Management Note (Signed)
Case Management Note  Patient Details  Name: Elba BarmanLisa M Woznick MRN: [id number] Date of Birth: 09/19/1955  Subjective/Objective:                    Action/Plan: Plan to discharge home with spouse and [facility] for HHPT   Expected Discharge Date:  11/14/17               Expected Discharge Plan:  Home w Home Health Services  In-House Referral:     Discharge planning Services  CM Consult  Post Acute Care Choice:    Choice offered to:  Patient  DME Arranged:    DME Agency:     HH Arranged:  PT HH Agency:  [facility]  Status of Service:  Completed, signed off  If discussed at Long Length of Stay Meetings, dates discussed:    Additional CommentsGeni Bers:  Shaelynn Dragos, RN 11/14/2017, 9:53 AM

## 2017-11-14 NOTE — Progress Notes (Signed)
SATURATION QUALIFICATIONS: (This note is used to comply with regulatory documentation for home oxygen)  Patient Saturations on Room Air at Rest = 86%  Patient Saturations on Room Air while Ambulating = 80%  Patient Saturations on 2 Liters of oxygen while Ambulating = 89%  Please briefly explain why patient needs home oxygen:

## 2017-11-14 NOTE — Discharge Instructions (Signed)
Dysphagia Diet Level 3, Mechanically Advanced °The dysphagia level 3 diet includes foods that are soft, moist, and can be chopped into 1-inch chunks. This diet is helpful for people with mild swallowing difficulties. It reduces the risk of food getting caught in the windpipe, trachea, or lungs. °What do I need to know about this diet? °· You may eat foods that are soft and moist. °· If you were on the dysphagia level 1 or level 2 diets, you may eat any of the foods included on those lists. °· Avoid foods that are dry, hard, sticky, chewy, coarse, and crunchy. Also avoid large cuts of food. °· Take small bites. Each bite should contain 1 inch or less of food. °· Thicken liquids if instructed by your health care provider. Follow your health care provider's instructions on how to do this and to what consistency. °· See your dietitian or speech language pathologist regularly for help with your dietary changes. °What foods can I eat? °Grains °Moist breads without nuts or seeds. Biscuits, muffins, pancakes, and waffles well-moistened with syrup, jelly, margarine, or butter. Smooth cereals with plenty of milk to moisten them. Moist bread stuffing. Moist rice. °Vegetables °All cooked, soft vegetables. Shredded lettuce. Tender fried potatoes. °Fruits °All canned and cooked fruits. Soft, peeled fresh fruits, such as peaches, nectarines, kiwis, cantaloupe, honeydew melon, and watermelon without seeds. Soft berries, such as strawberries. °Meat and Other Protein Sources °Moist ground or finely diced or sliced meats. Solid, tender cuts of meat. Meatloaf. Hamburger with a bun. Sausage patty. Deli thin-sliced lunch meat. Chicken, egg, or tuna salad sandwich. Sloppy joe. Moist fish. Eggs prepared any way. Casseroles with small chunks of meats, ground meats, or tender meats. °Dairy °Cheese spreads without coarse large chunks. Shredded cheese. Cheese slices. Cottage cheese. Milk at the right texture. Smooth frappes. Yogurt without  nuts or coconut. Ask your health care provider whether you can have frozen desserts (such as malts or milk shakes) and thin liquids. °Sweets/Desserts °Soft, smooth, moist desserts. Non-chewy, smooth candy. Jam. Jelly. Honey. Preserves. Ask your health care provider whether you can have frozen desserts. °Fats and Oils °Butter. Oils. Margarine. Mayonnaise. Gravy. Spreads. °Other °All seasonings and sweeteners. All sauces without large chunks. °The items listed above may not be a complete list of recommended foods or beverages. Contact your dietitian for more options. °What foods are not recommended? °Grains °Coarse or dry cereals. Dry breads. Toast. Crackers. Tough, crusty breads, such French bread and baguettes. Tough, crisp fried potatoes. Potato skins. Dry bread stuffing. Granola. Popcorn. Chips. °Vegetables °All raw vegetables except shredded lettuce. Cooked corn. Rubbery or stiff cooked vegetables. Stringy vegetables, such as celery. °Fruits °Hard fruits that are difficult to chew, such as apples or pears. Stringy, high-pulp fruits, such as pineapple, papaya, or mango. Fruits with tough skins, such as grapes. Coconut. All dried fruits. Fruit leather. Fruit roll-ups. Fruit snacks. °Meat and Other Protein Sources °Dry or tough meats or poultry. Dry fish. Fish with bones. Peanut butter. All nuts and seeds. °Dairy °Any with nuts, seeds, chocolate chips, dried fruit, coconut, or pineapple. °Sweets/Desserts °Dry cakes. Chewy or dry cookies. Any with nuts, seeds, dry fruits, coconut, pineapple, or anything dry, sticky, or hard. Chewy caramel. Licorice. Taffy-type candies. Ask your health care provider whether you can have frozen desserts. °Fats and Oils °Any with chunks, nuts, seeds, or pineapple. Olives. Pickles. °Other °Soups with tough or large chunks of meats, poultry, or vegetables. Corn or clam chowder. °The items listed above may not be a   complete list of foods and beverages to avoid. Contact your dietitian for  more information. °This information is not intended to replace advice given to you by your health care provider. Make sure you discuss any questions you have with your health care provider. °Document Released: 04/01/2005 Document Revised: 09/07/2015 Document Reviewed: 03/15/2013 °Elsevier Interactive Patient Education © 2018 Elsevier Inc. ° °

## 2017-11-14 NOTE — Care Management Important Message (Signed)
Important Message  Patient Details  Name: Elba BarmanLisa M Enoch MRN: [id number] Date of Birth: 12/08/1955   Medicare Important Message Given:  Yes    Caren MacadamFuller, Ashle Stief 11/14/2017, 10:54 AMImportant Message  Patient Details  Name: Elba BarmanLisa M Rickel MRN: [id number] Date of Birth: 10/28/1955   Medicare Important Message Given:  Yes    Caren MacadamFuller, Eidan Muellner 11/14/2017, 10:54 AM

## 2017-11-16 LAB — CULTURE, BLOOD (ROUTINE X 2)
Culture: NO GROWTH
Special Requests: ADEQUATE

## 2017-11-17 LAB — CULTURE, BLOOD (ROUTINE X 2)
Culture: NO GROWTH
Culture: NO GROWTH
SPECIAL REQUESTS: ADEQUATE

## 2017-12-31 ENCOUNTER — Other Ambulatory Visit: Payer: Self-pay

## 2017-12-31 ENCOUNTER — Encounter (HOSPITAL_BASED_OUTPATIENT_CLINIC_OR_DEPARTMENT_OTHER): Payer: Self-pay

## 2017-12-31 ENCOUNTER — Emergency Department (HOSPITAL_BASED_OUTPATIENT_CLINIC_OR_DEPARTMENT_OTHER)
Admission: EM | Admit: 2017-12-31 | Discharge: 2017-12-31 | Disposition: A | Discharge status: Home / Self Care | Payer: Medicare HMO | Attending: Emergency Medicine | Admitting: Emergency Medicine

## 2017-12-31 DIAGNOSIS — Z79899 Other long term (current) drug therapy: Secondary | ICD-10-CM | POA: Diagnosis not present

## 2017-12-31 DIAGNOSIS — T8131XD Disruption of external operation (surgical) wound, not elsewhere classified, subsequent encounter: Secondary | ICD-10-CM | POA: Insufficient documentation

## 2017-12-31 DIAGNOSIS — S8012XD Contusion of left lower leg, subsequent encounter: Secondary | ICD-10-CM | POA: Insufficient documentation

## 2017-12-31 DIAGNOSIS — W228XXD Striking against or struck by other objects, subsequent encounter: Secondary | ICD-10-CM | POA: Insufficient documentation

## 2017-12-31 DIAGNOSIS — S81802D Unspecified open wound, left lower leg, subsequent encounter: Secondary | ICD-10-CM

## 2017-12-31 DIAGNOSIS — Z87891 Personal history of nicotine dependence: Secondary | ICD-10-CM | POA: Diagnosis not present

## 2017-12-31 DIAGNOSIS — Y658 Other specified misadventures during surgical and medical care: Secondary | ICD-10-CM | POA: Diagnosis not present

## 2017-12-31 DIAGNOSIS — I10 Essential (primary) hypertension: Secondary | ICD-10-CM | POA: Insufficient documentation

## 2017-12-31 DIAGNOSIS — T148XXA Other injury of unspecified body region, initial encounter: Secondary | ICD-10-CM

## 2017-12-31 MED ORDER — HYDROCODONE-ACETAMINOPHEN 5-325 MG PO TABS
1.0000 | ORAL_TABLET | Freq: Once | ORAL | Status: AC
  Administered 2017-12-31: 2 via ORAL
  Filled 2017-12-31: qty 1

## 2017-12-31 MED ORDER — HYDROCODONE-ACETAMINOPHEN 5-325 MG PO TABS
ORAL_TABLET | ORAL | Status: AC
  Filled 2017-12-31: qty 1

## 2017-12-31 NOTE — ED Triage Notes (Signed)
Pt states she [facility]CP took sutures out 9/9-area opened back up today-to triage in w/c-NAD

## 2017-12-31 NOTE — Discharge Instructions (Signed)
Take your regularly prescribed pain medications for pain.  Make sure you are keeping the wound clean and dry.  Follow-up with your primary care doctor next 24 to 48 hours for further evaluation.  Return to emergency department for any worsening pain, redness or swelling that streaks up and down the leg, fever or any other worsening or concerning symptoms.

## 2017-12-31 NOTE — ED Provider Notes (Addendum)
[facility] EMERGENCY DEPARTMENT Provider Note   CSN: [id number] Arrival date & time: 12/31/17  [id number]     History   Chief Complaint Chief Complaint  Patient presents with  . Wound Dehiscence    HPI Heidi Green is a 62 y.o. female has no history of back pain, chest pain, CVA, kidney stones who presents for evaluation of wound to the anterior aspect of left lower extremity.  She reports that on 12/13/2017, she was sitting in her wheelchair and turned the corner and hit the anterior aspect of her leg on the corner causing a laceration.  [facility] she had the laceration repaired.  Patient reports that on 12/22/2017 she had her sutures removed.  She reports she has not had any complications since then.  She reports that today, the area opened back up and was causing her worsening pain.  No new trauma, injury.  She is not currently on any blood thinners.  The patient denies any warmth, redness around the area, purulent drainage, fevers.  Patient is wheelchair-bound at baseline secondary to CVA.  The history is provided by the patient.    Past Medical History:  Diagnosis Date  . Arthritis    in back  . Back pain   . Chest pain 06/16/2012   negative cath with normal LV function  . Headache(784.0)   . Hypertension   . 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 Kidney stones   . Stroke [facility](HCC)     Patient Active Problem List   Diagnosis Date Noted  . HCAP (healthcare-associated pneumonia) 11/11/2017  . Acute lower UTI 11/11/2017  . AKI (acute kidney injury) (HCC) 11/11/2017  . Lactic acid acidosis 11/11/2017  . Severe sepsis (HCC) 11/11/2017  . CAP (community acquired pneumonia) 10/04/2017  . Class 2 obesity   . HTN (hypertension) 06/16/2012  . Dyslipidemia 06/16/2012  . Back pain 06/16/2012    Past Surgical History:  Procedure Laterality Date  . ABDOMINAL HYSTERECTOMY    . BACK SURGERY       x 3  . LEFT HEART CATHETERIZATION WITH CORONARY ANGIOGRAM N/A 06/16/2012   Procedure: LEFT HEART CATHETERIZATION WITH CORONARY ANGIOGRAM;  Surgeon: Peter M SwazilandJordan, MD;  [facility];  Service: Cardiovascular;  Laterality: N/A;  . LITHOTRIPSY    . TUBAL LIGATION       OB History   None      Home Medications    Prior to Admission medications   Medication Sig Start Date End Date Taking? Authorizing Provider  acetaminophen (TYLENOL) 325 MG tablet Take 2 tablets (650 mg total) by mouth every 4 (four) hours as needed for mild pain (or temp > 37.5 C (99.5 F)). Combination of acetaminophen in vicodin and tylenol should not exceed max dose of 4000mg  daily Patient not taking: Reported on 11/11/2017 08/01/16   Layne BentonBiby, Sharon L, NP  atorvastatin (LIPITOR) 40 MG tablet Take 40 mg by mouth daily. 01/13/17   [provider]  diclofenac (VOLTAREN) 50 MG EC tablet TAKE 1 TABLET PO TWICE A DAY 10/11/16   [provider]  DULoxetine (CYMBALTA) 60 MG capsule TAKE 1 CAPSULE PO AT BEDITME 10/11/16   [provider]  esomeprazole (NEXIUM) 20 MG capsule Take 20 mg by mouth daily as needed (GERD).    [provider]  HYDROcodone-acetaminophen (NORCO) 7.5-325 MG tablet Take 1 tablet by mouth every 6 (six) hours as needed (  pain).     [provider]  levofloxacin (LEVAQUIN) 750 MG tablet Take 1 tablet (750 mg total) by mouth daily. 11/14/17   Rolly Salter, MD  lisinopril (PRINIVIL,ZESTRIL) 20 MG tablet 20 mg daily.  01/13/17   [provider]  LYRICA 150 MG capsule Take 150 mg by mouth 2 (two) times daily. 09/06/17   [provider]  mirabegron ER (MYRBETRIQ) 25 MG TB24 tablet Take 25 mg by mouth at bedtime.    [provider]  polyethylene glycol (MIRALAX / GLYCOLAX) packet Take 17 g by mouth daily. 11/14/17   Rolly Salter, MD  rOPINIRole (REQUIP) 2 MG tablet Take 2 mg by mouth 2 (two) times daily.     [provider]   senna-docusate (SENOKOT-S) 8.6-50 MG tablet Take 1 tablet by mouth 2 (two) times daily. Patient not taking: Reported on 01/15/2017 08/01/16   Layne Benton, NP  Vitamin D, Ergocalciferol, (DRISDOL) 50000 units CAPS capsule Take 50,000 Units by mouth once a week. 10/31/17   [provider]    Family History Family History  Problem Relation Age of Onset  . Emphysema Mother   . Diabetes Mother     Social History Social History   Tobacco Use  . Smoking status: Former Smoker    Packs/day: 0.50    Years: 26.00    Pack years: 13.00    Types: Cigarettes    Last attempt to quit: 06/17/2012    Years since quitting: 5.5  . Smokeless tobacco: Never Used  Substance Use Topics  . Alcohol use: No  . Drug use: No     Allergies   Garamycin [gentamicin sulfate]; Other; Penicillins; and Norvasc [amlodipine besylate]   Review of Systems Review of Systems  Constitutional: Negative for fever.  Skin: Positive for wound. Negative for color change.  All other systems reviewed and are negative.    Physical Exam Updated Vital Signs BP 114/73 (BP Location: Right Arm)   Pulse 66   Temp 97.9 F (36.6 C) (Oral)   Resp 16   Ht 5\' 6"  (1.676 m)   Wt 102.1 kg   SpO2 95%   BMI 36.32 kg/m   Physical Exam  Constitutional: She appears well-developed and well-nourished.  HENT:  Head: Normocephalic and atraumatic.  Eyes: Conjunctivae and EOM are normal. Right eye exhibits no discharge. Left eye exhibits no discharge. No scleral icterus.  Cardiovascular:  Pulses:      Dorsalis pedis pulses are 2+ on the right side, and 2+ on the left side.  Pulmonary/Chest: Effort normal.  Neurological: She is alert.  Skin: Skin is warm and dry. Capillary refill takes less than 2 seconds.     Psychiatric: She has a normal mood and affect. Her speech is normal and behavior is normal.  Nursing note and vitals reviewed.    ED Treatments / Results  Labs (all labs ordered are listed, but only  abnormal results are displayed) Labs Reviewed - No data to display  EKG None  Radiology No results found.  Procedures Irrigation and debridement Date/Time: 12/31/2017 9:18 PM Performed by: Maxwell Caul, PA-C Authorized by: Maxwell Caul, PA-C  Consent: Verbal consent obtained. Consent given by: patient Patient understanding: patient states understanding of the procedure being performed Patient consent: the patient's understanding of the procedure matches consent given Procedure consent: procedure consent matches procedure scheduled Relevant documents: relevant documents present and verified Test results: test results available and properly labeled Site marked: the operative site was  marked Imaging studies: imaging studies available Patient identity confirmed: verbally with patient Patient tolerance: Patient tolerated the procedure well with no immediate complications Comments: Initial evaluation, patient had a small area of hematoma noted to the anterior aspect.  Further expiration of the wound showed hematoma that was deeper along the edges of the wounds lining.  Hematoma was removed using forceps.    (including critical care time)  Medications Ordered in ED Medications  HYDROcodone-acetaminophen (NORCO/VICODIN) 5-325 MG per tablet 1-2 tablet (has no administration in time range)     Initial Impression / Assessment and Plan / ED Course  I have reviewed the triage vital signs and the nursing notes.  Pertinent labs & imaging results that were available during my care of the patient were reviewed by me and considered in my medical decision making (see chart for details).     62 year old female who presents for evaluation of 1 to left lower extremity.  Initially had an injury on 12/13/2017.  Sutures removed on 12/22/2017.  No comp occasions since then.  Wound reopened today.  No surrounding warmth, erythema.  No fevers.  No purulent drainage.  She did notice a lot of  bleeding.  She is not currently on blood thinners. Patient is afebrile, non-toxic appearing, sitting comfortably on examination table. Vital signs reviewed and stable.  Patient is neurovascularly intact.  On exam, patient has a 3 cm wound noted anterior aspect of her left lower extremity.  No surrounding warmth, erythema.  My initial evaluation, there was some evidence of superficial hematoma.  Full irrigation of the wound showed further deep hematoma that was removed using irrigation and debridement.  Even that this wound has been almost a month out, no indication for suture repair here in the ED.  Will have wound heal by secondary closure.  Additionally, there are no signs of surrounding infection on today's exam.  No indications for antibiotics. Discussed patient with Dr. Criss Alvine who is agreeable to plan.  Encourage patient to follow-up with primary care doctor in the next 24 to 48 hours. Patient had ample opportunity for questions and discussion. All patient's questions were answered with full understanding. Strict return precautions discussed. Patient expresses understanding and agreement to plan.   Final Clinical Impressions(s) / ED Diagnoses   Final diagnoses:  Wound of left lower extremity, subsequent encounter  Hematoma    ED Discharge Orders    None       Rosana Hoes 01/01/18 0044    Pricilla Loveless, MD 01/02/18 0708    Maxwell Caul, PA-C 01/25/18 0040    Pricilla Loveless, MD 01/28/18 2201

## 2018-06-17 IMAGING — CT CT HEAD W/O CM
6 of 10 series · 17 of 47 positions shown, 18 images · non-contrast
Comparison: None.

CLINICAL DATA: Altered mental status.  Disoriented.  Combative.

EXAM:
CT HEAD WITHOUT CONTRAST
TECHNIQUE: Contiguous axial images were obtained from the base of the skull
through the vertex without intravenous contrast.

[Series 3: head w/o · axial · non-contrast · 0.45mm/px · z∈[-60,-0]mm · 3 of 25 slices shown (1 of 3)]
[im 7/25  brain]
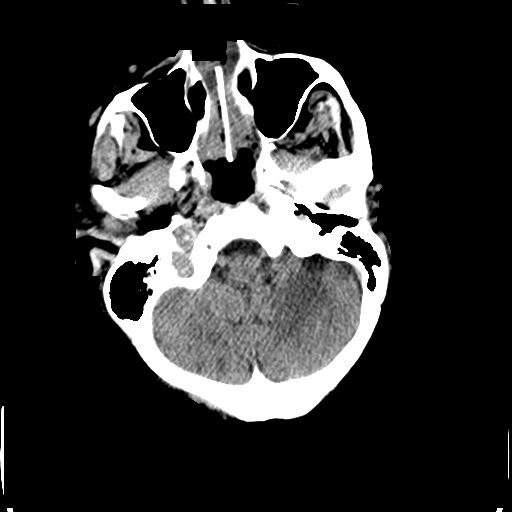
[im 13/25  brain]
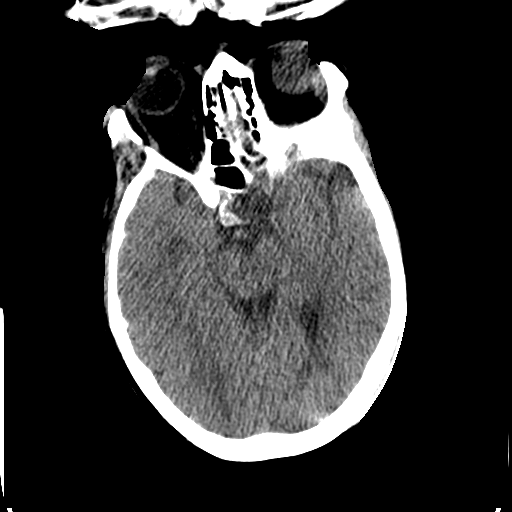
[im 19/25  brain]
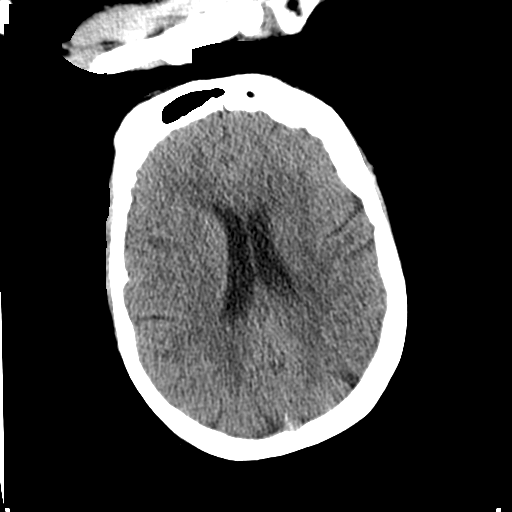

[Series 5: coronal · coronal · 0.24mm/px · 3 of 60 slices shown]
[im 15/60  brain]
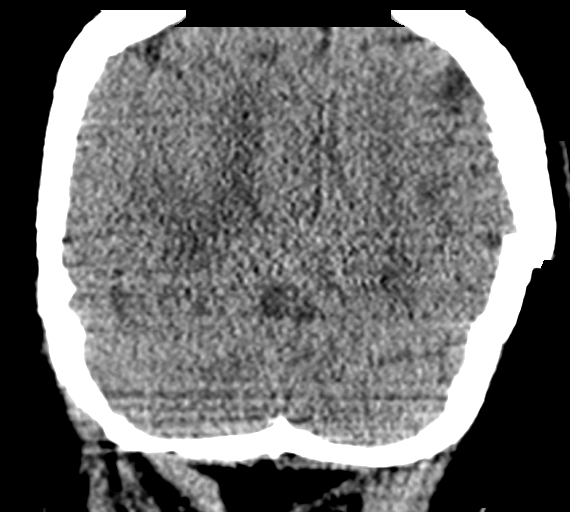
[im 30/60  brain]
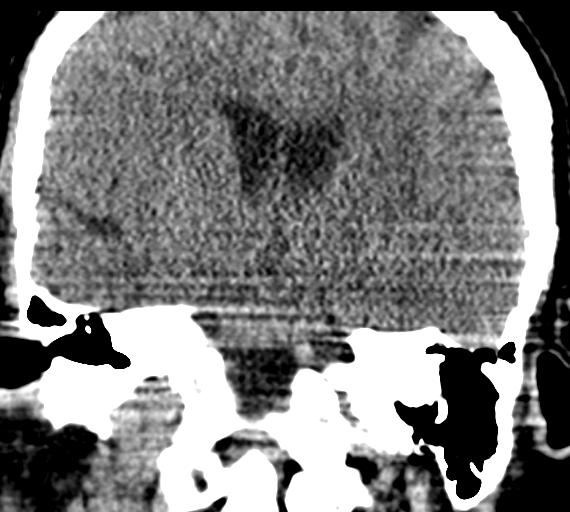
[im 45/60  brain]
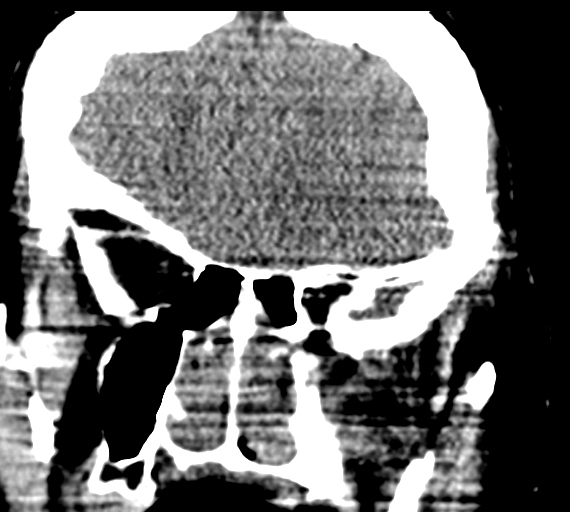

[Series 7: head w/o · axial · non-contrast · 0.45mm/px · z∈[-55,+20]mm · 3 of 31 slices shown, 4 images (2 of 3)]
[im 8/31  brain]
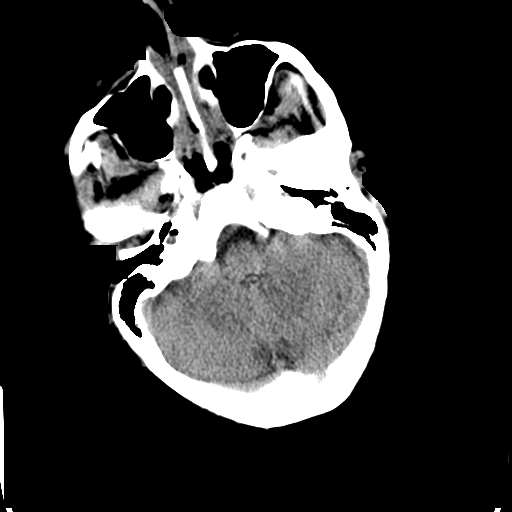
[im 8/31  bone]
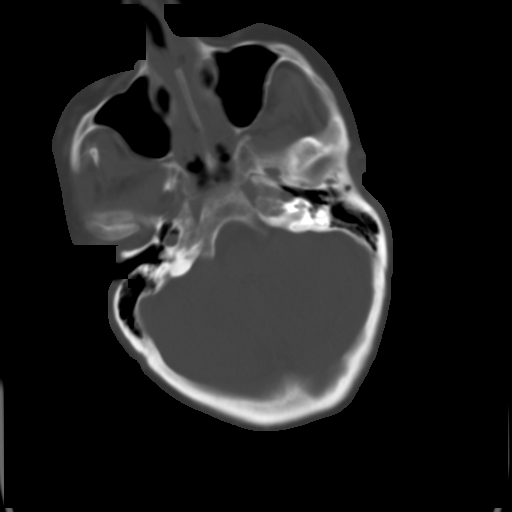
[im 16/31  brain]
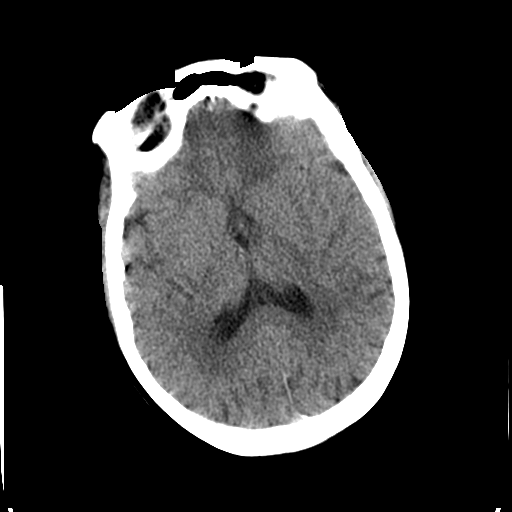
[im 23/31  brain]
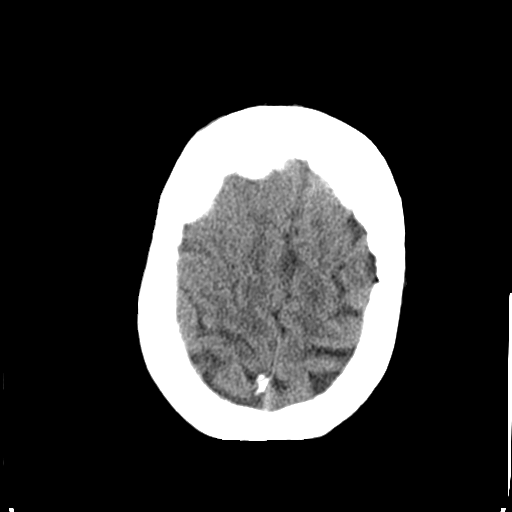

[Series 8: bone windows · axial · 0.45mm/px · z∈[-55,+20]mm · 3 of 31 slices shown]
[im 8/31  bone]
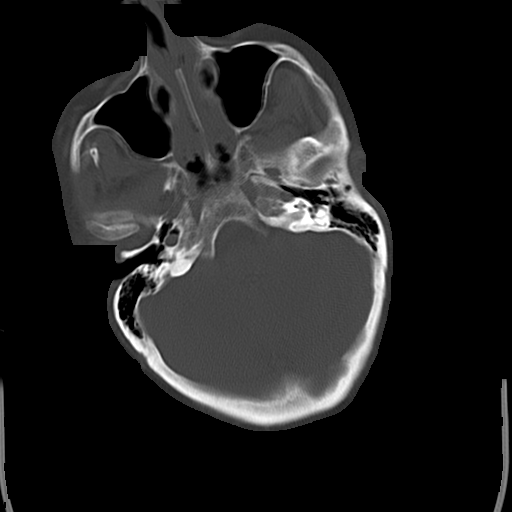
[im 16/31  bone]
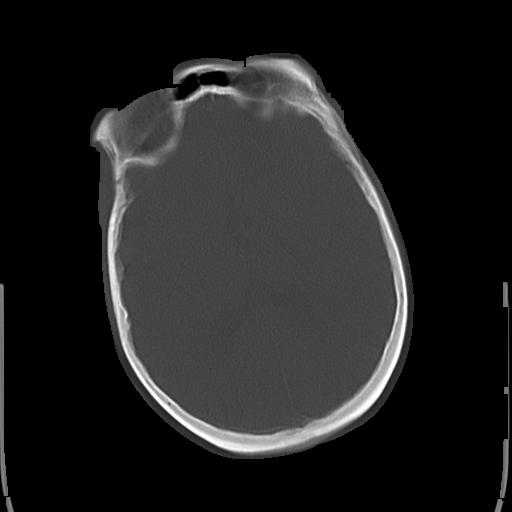
[im 23/31  bone]
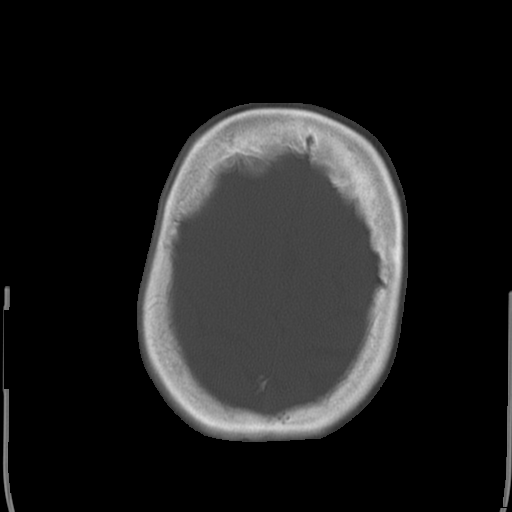

[Series 11: head w/o · axial · non-contrast · 0.45mm/px · z∈[-83,-13]mm · 3 of 30 slices shown (3 of 3)]
[im 8/30  brain]
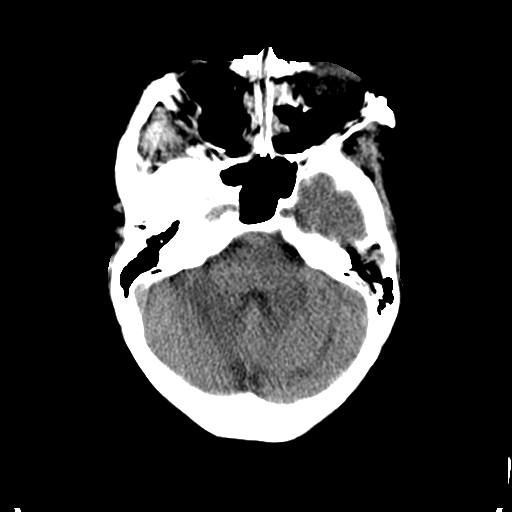
[im 15/30  brain]
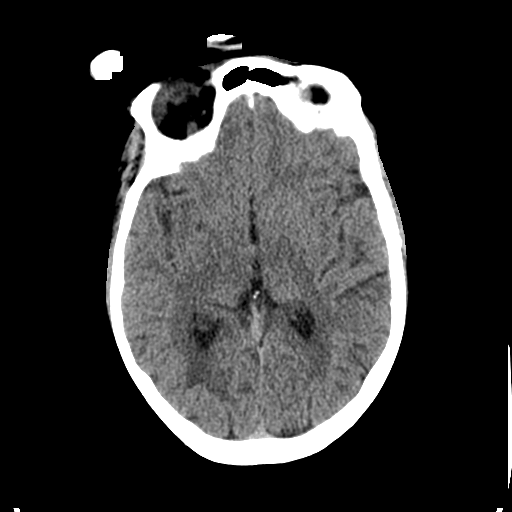
[im 22/30  brain]
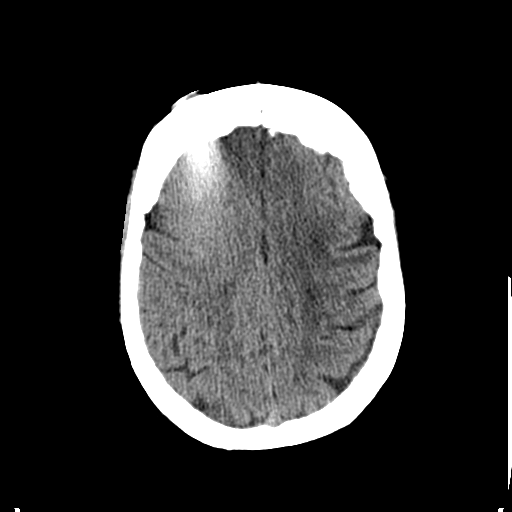

[Series 14: sagittal · sagittal · 0.30mm/px · 2 of 47 slices shown]
[im 16/47  brain]
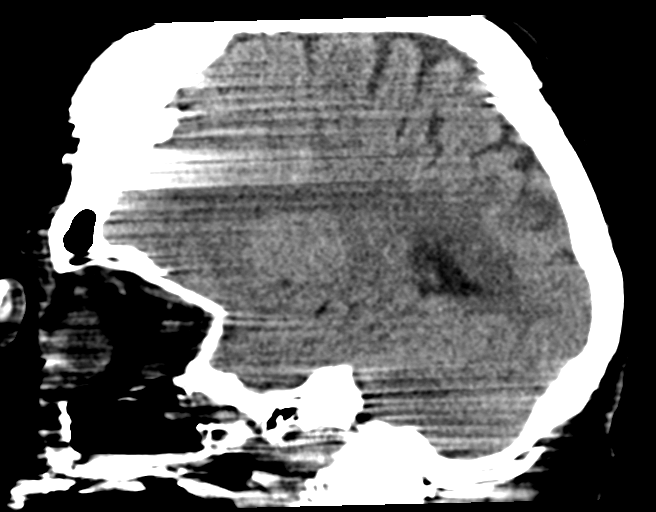
[im 31/47  brain]
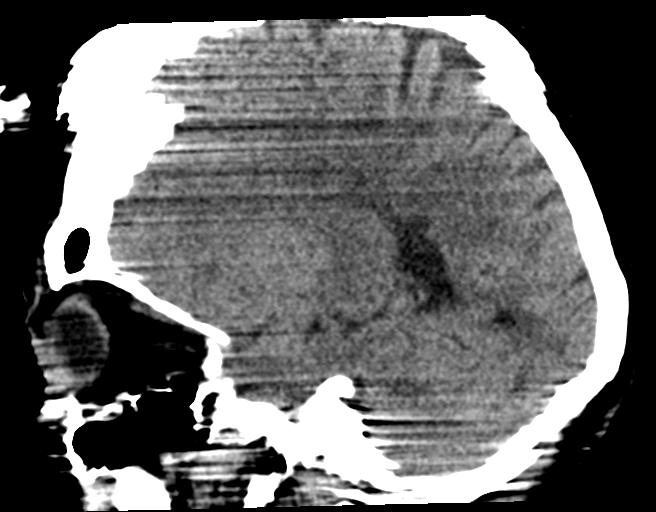

[17 of 47 positions shown; findings below may reference images not displayed]

FINDINGS: Brain: Motion degraded examination. Allowing for that, the brain
appears normal without evidence of atrophy, mass, hemorrhage,
hydrocephalus or extra-axial collection. One could question some
chronic small-vessel change of the white matter. Sensitivity is
limited.

Vascular: There is atherosclerotic calcification of the major
vessels at the base of the brain.

Skull: Negative

Sinuses/Orbits: Clear/normal

Other: None significant
IMPRESSION: Markedly motion degraded exam. No abnormality visible by CT. This
rules out any large intracranial hemorrhage, hydrocephalus or mass
effect/shift. There may be some chronic small-vessel changes of the
white matter, but the examination is quite limited.

## 2018-08-19 ENCOUNTER — Other Ambulatory Visit: Payer: Self-pay

## 2018-08-19 ENCOUNTER — Encounter (HOSPITAL_COMMUNITY): Payer: Self-pay

## 2018-08-19 ENCOUNTER — Inpatient Hospital Stay (HOSPITAL_COMMUNITY)
Admission: EM | Admit: 2018-08-19 | Discharge: 2018-08-21 | DRG: 871 | Disposition: A | Discharge status: Home / Self Care | Payer: Medicare HMO | Attending: Internal Medicine | Admitting: Internal Medicine

## 2018-08-19 ENCOUNTER — Emergency Department (HOSPITAL_COMMUNITY): Payer: Medicare HMO

## 2018-08-19 DIAGNOSIS — J181 Lobar pneumonia, unspecified organism: Secondary | ICD-10-CM | POA: Diagnosis not present

## 2018-08-19 DIAGNOSIS — Z883 Allergy status to other anti-infective agents status: Secondary | ICD-10-CM

## 2018-08-19 DIAGNOSIS — Z23 Encounter for immunization: Secondary | ICD-10-CM

## 2018-08-19 DIAGNOSIS — J9601 Acute respiratory failure with hypoxia: Secondary | ICD-10-CM | POA: Diagnosis present

## 2018-08-19 DIAGNOSIS — I872 Venous insufficiency (chronic) (peripheral): Secondary | ICD-10-CM | POA: Diagnosis present

## 2018-08-19 DIAGNOSIS — Z90710 Acquired absence of both cervix and uterus: Secondary | ICD-10-CM

## 2018-08-19 DIAGNOSIS — J189 Pneumonia, unspecified organism: Secondary | ICD-10-CM

## 2018-08-19 DIAGNOSIS — I69951 Hemiplegia and hemiparesis following unspecified cerebrovascular disease affecting right dominant side: Secondary | ICD-10-CM | POA: Diagnosis not present

## 2018-08-19 DIAGNOSIS — Z880 Allergy status to penicillin: Secondary | ICD-10-CM

## 2018-08-19 DIAGNOSIS — Z87891 Personal history of nicotine dependence: Secondary | ICD-10-CM

## 2018-08-19 DIAGNOSIS — Z79899 Other long term (current) drug therapy: Secondary | ICD-10-CM | POA: Diagnosis not present

## 2018-08-19 DIAGNOSIS — J690 Pneumonitis due to inhalation of food and vomit: Secondary | ICD-10-CM | POA: Diagnosis present

## 2018-08-19 DIAGNOSIS — Z6835 Body mass index (BMI) 35.0-35.9, adult: Secondary | ICD-10-CM | POA: Diagnosis not present

## 2018-08-19 DIAGNOSIS — M545 Low back pain, unspecified: Secondary | ICD-10-CM

## 2018-08-19 DIAGNOSIS — I4510 Unspecified right bundle-branch block: Secondary | ICD-10-CM | POA: Diagnosis present

## 2018-08-19 DIAGNOSIS — E669 Obesity, unspecified: Secondary | ICD-10-CM | POA: Diagnosis present

## 2018-08-19 DIAGNOSIS — Z20828 Contact with and (suspected) exposure to other viral communicable diseases: Secondary | ICD-10-CM | POA: Diagnosis present

## 2018-08-19 DIAGNOSIS — N390 Urinary tract infection, site not specified: Secondary | ICD-10-CM | POA: Diagnosis present

## 2018-08-19 DIAGNOSIS — Z888 Allergy status to other drugs, medicaments and biological substances status: Secondary | ICD-10-CM

## 2018-08-19 DIAGNOSIS — E876 Hypokalemia: Secondary | ICD-10-CM | POA: Diagnosis present

## 2018-08-19 DIAGNOSIS — G8929 Other chronic pain: Secondary | ICD-10-CM | POA: Diagnosis present

## 2018-08-19 DIAGNOSIS — I10 Essential (primary) hypertension: Secondary | ICD-10-CM | POA: Diagnosis present

## 2018-08-19 DIAGNOSIS — I959 Hypotension, unspecified: Secondary | ICD-10-CM

## 2018-08-19 DIAGNOSIS — E785 Hyperlipidemia, unspecified: Secondary | ICD-10-CM | POA: Diagnosis present

## 2018-08-19 DIAGNOSIS — A419 Sepsis, unspecified organism: Secondary | ICD-10-CM | POA: Diagnosis present

## 2018-08-19 DIAGNOSIS — L0390 Cellulitis, unspecified: Secondary | ICD-10-CM | POA: Diagnosis present

## 2018-08-19 DIAGNOSIS — L97822 Non-pressure chronic ulcer of other part of left lower leg with fat layer exposed: Secondary | ICD-10-CM | POA: Diagnosis present

## 2018-08-19 HISTORY — DX: Sleep apnea, unspecified: G47.30

## 2018-08-19 LAB — CBC WITH DIFFERENTIAL/PLATELET
Abs Immature Granulocytes: 0.09 10*3/uL — ABNORMAL HIGH (ref 0.00–0.07)
Basophils Absolute: 0 10*3/uL (ref 0.0–0.1)
Basophils Relative: 0 %
Eosinophils Absolute: 0 10*3/uL (ref 0.0–0.5)
Eosinophils Relative: 0 %
HCT: 37.6 % (ref 36.0–46.0)
Hemoglobin: 11.2 g/dL — ABNORMAL LOW (ref 12.0–15.0)
Immature Granulocytes: 1 %
Lymphocytes Relative: 6 %
Lymphs Abs: 0.9 10*3/uL (ref 0.7–4.0)
MCH: 22.6 pg — ABNORMAL LOW (ref 26.0–34.0)
MCHC: 29.8 g/dL — ABNORMAL LOW (ref 30.0–36.0)
MCV: 76 fL — ABNORMAL LOW (ref 80.0–100.0)
Monocytes Absolute: 0.6 10*3/uL (ref 0.1–1.0)
Monocytes Relative: 4 %
Neutro Abs: 12.6 10*3/uL — ABNORMAL HIGH (ref 1.7–7.7)
Neutrophils Relative %: 89 %
Platelets: 273 10*3/uL (ref 150–400)
RBC: 4.95 MIL/uL (ref 3.87–5.11)
RDW: 17.9 % — ABNORMAL HIGH (ref 11.5–15.5)
WBC: 14.3 10*3/uL — ABNORMAL HIGH (ref 4.0–10.5)
nRBC: 0 % (ref 0.0–0.2)

## 2018-08-19 LAB — RESPIRATORY PANEL BY PCR

## 2018-08-19 LAB — URINALYSIS, ROUTINE W REFLEX MICROSCOPIC
Bilirubin Urine: NEGATIVE
Glucose, UA: NEGATIVE mg/dL
Hgb urine dipstick: NEGATIVE
Ketones, ur: NEGATIVE mg/dL
Leukocytes,Ua: NEGATIVE
Nitrite: NEGATIVE
Protein, ur: NEGATIVE mg/dL
Specific Gravity, Urine: 1.012 (ref 1.005–1.030)
pH: 6 (ref 5.0–8.0)

## 2018-08-19 LAB — MRSA PCR SCREENING: MRSA by PCR: NEGATIVE

## 2018-08-19 LAB — COMPREHENSIVE METABOLIC PANEL
ALT: 22 U/L (ref 0–44)
AST: 29 U/L (ref 15–41)
Albumin: 4.2 g/dL (ref 3.5–5.0)
Alkaline Phosphatase: 93 U/L (ref 38–126)
Anion gap: 11 (ref 5–15)
BUN: 16 mg/dL (ref 8–23)
CO2: 24 mmol/L (ref 22–32)
Calcium: 9 mg/dL (ref 8.9–10.3)
Chloride: 106 mmol/L (ref 98–111)
Creatinine, Ser: 0.77 mg/dL (ref 0.44–1.00)
GFR calc Af Amer: >60 mL/min (ref >60)
GFR calc non Af Amer: >60 mL/min (ref >60)
Glucose, Bld: 100 mg/dL — ABNORMAL HIGH (ref 70–99)
Potassium: 3 mmol/L — ABNORMAL LOW (ref 3.5–5.1)
Sodium: 141 mmol/L (ref 135–145)
Total Bilirubin: 0.6 mg/dL (ref 0.3–1.2)
Total Protein: 8 g/dL (ref 6.5–8.1)

## 2018-08-19 LAB — SARS CORONAVIRUS 2 BY RT PCR (HOSPITAL ORDER, PERFORMED IN ~~LOC~~ HOSPITAL LAB): SARS Coronavirus 2: NEGATIVE

## 2018-08-19 LAB — LACTIC ACID, PLASMA
Lactic Acid, Venous: 1.5 mmol/L (ref 0.5–1.9)
Lactic Acid, Venous: 2.3 mmol/L (ref 0.5–1.9)

## 2018-08-19 LAB — INFLUENZA PANEL BY PCR (TYPE A & B)
Influenza A By PCR: NEGATIVE
Influenza B By PCR: NEGATIVE

## 2018-08-19 MED ORDER — HYDROCODONE-ACETAMINOPHEN 7.5-325 MG PO TABS
1.0000 | ORAL_TABLET | Freq: Four times a day (QID) | ORAL | Status: DC | PRN
  Administered 2018-08-19 – 2018-08-21 (×7): 1 via ORAL
  Filled 2018-08-19 (×8): qty 1

## 2018-08-19 MED ORDER — VITAMIN D (ERGOCALCIFEROL) 1.25 MG (50000 UNIT) PO CAPS: 50000.0000 [IU] | ORAL_CAPSULE | ORAL | Status: DC

## 2018-08-19 MED ORDER — PANTOPRAZOLE SODIUM 40 MG PO TBEC
40.0000 mg | DELAYED_RELEASE_TABLET | Freq: Every day | ORAL | Status: DC
  Administered 2018-08-19 – 2018-08-21 (×3): 40 mg via ORAL
  Filled 2018-08-19 (×3): qty 1

## 2018-08-19 MED ORDER — ENOXAPARIN SODIUM 40 MG/0.4ML ~~LOC~~ SOLN
40.0000 mg | SUBCUTANEOUS | Status: DC
  Administered 2018-08-20: 40 mg via SUBCUTANEOUS
  Filled 2018-08-19 (×2): qty 0.4

## 2018-08-19 MED ORDER — POTASSIUM CHLORIDE CRYS ER 10 MEQ PO TBCR
10.0000 meq | EXTENDED_RELEASE_TABLET | Freq: Two times a day (BID) | ORAL | Status: DC
  Administered 2018-08-19 – 2018-08-20 (×3): 10 meq via ORAL
  Filled 2018-08-19 (×5): qty 1

## 2018-08-19 MED ORDER — ROPINIROLE HCL 1 MG PO TABS
2.0000 mg | ORAL_TABLET | Freq: Two times a day (BID) | ORAL | Status: DC
  Administered 2018-08-19 – 2018-08-21 (×5): 2 mg via ORAL
  Filled 2018-08-19 (×5): qty 2

## 2018-08-19 MED ORDER — MIRABEGRON ER 25 MG PO TB24
25.0000 mg | ORAL_TABLET | Freq: Every day | ORAL | Status: DC
  Administered 2018-08-19 – 2018-08-20 (×2): 25 mg via ORAL
  Filled 2018-08-19 (×3): qty 1

## 2018-08-19 MED ORDER — POTASSIUM CHLORIDE CRYS ER 20 MEQ PO TBCR
40.0000 meq | EXTENDED_RELEASE_TABLET | ORAL | Status: AC
  Administered 2018-08-19 (×2): 40 meq via ORAL
  Filled 2018-08-19 (×2): qty 2

## 2018-08-19 MED ORDER — LISINOPRIL 20 MG PO TABS: 20.0000 mg | ORAL_TABLET | Freq: Every day | ORAL | Status: DC

## 2018-08-19 MED ORDER — SODIUM CHLORIDE 0.9 % IV SOLN
2.0000 g | Freq: Once | INTRAVENOUS | Status: AC
  Administered 2018-08-19: 2 g via INTRAVENOUS
  Filled 2018-08-19: qty 2

## 2018-08-19 MED ORDER — VANCOMYCIN HCL IN DEXTROSE 1-5 GM/200ML-% IV SOLN: 1000.0000 mg | Freq: Once | INTRAVENOUS | Status: DC

## 2018-08-19 MED ORDER — ACETAMINOPHEN 650 MG RE SUPP
650.0000 mg | Freq: Once | RECTAL | Status: AC
  Administered 2018-08-19: 06:00:00 650 mg via RECTAL
  Filled 2018-08-19: qty 1

## 2018-08-19 MED ORDER — SODIUM CHLORIDE 0.9 % IV BOLUS
1000.0000 mL | Freq: Once | INTRAVENOUS | Status: AC
  Administered 2018-08-19: 1000 mL via INTRAVENOUS

## 2018-08-19 MED ORDER — LEVOFLOXACIN IN D5W 500 MG/100ML IV SOLN
500.0000 mg | INTRAVENOUS | Status: DC
  Administered 2018-08-19 – 2018-08-21 (×3): 500 mg via INTRAVENOUS
  Filled 2018-08-19 (×4): qty 100

## 2018-08-19 MED ORDER — TRIAMCINOLONE ACETONIDE 0.1 % EX CREA
1.0000 "application " | TOPICAL_CREAM | Freq: Two times a day (BID) | CUTANEOUS | Status: DC
  Administered 2018-08-19 – 2018-08-21 (×3): 1 via TOPICAL
  Filled 2018-08-19: qty 15

## 2018-08-19 MED ORDER — PREGABALIN 75 MG PO CAPS
150.0000 mg | ORAL_CAPSULE | Freq: Two times a day (BID) | ORAL | Status: DC
  Administered 2018-08-19 – 2018-08-21 (×5): 150 mg via ORAL
  Filled 2018-08-19 (×5): qty 2

## 2018-08-19 MED ORDER — SODIUM CHLORIDE 0.9 % IV SOLN: 500.0000 mg | INTRAVENOUS | Status: DC

## 2018-08-19 MED ORDER — SODIUM CHLORIDE 0.9 % IV SOLN
1000.0000 mL | INTRAVENOUS | Status: DC
  Administered 2018-08-19 – 2018-08-20 (×3): 1000 mL via INTRAVENOUS

## 2018-08-19 MED ORDER — METRONIDAZOLE IN NACL 5-0.79 MG/ML-% IV SOLN
500.0000 mg | Freq: Once | INTRAVENOUS | Status: AC
  Administered 2018-08-19: 500 mg via INTRAVENOUS
  Filled 2018-08-19: qty 100

## 2018-08-19 MED ORDER — VANCOMYCIN HCL 10 G IV SOLR
2000.0000 mg | Freq: Once | INTRAVENOUS | Status: AC
  Administered 2018-08-19: 2000 mg via INTRAVENOUS
  Filled 2018-08-19: qty 2000

## 2018-08-19 NOTE — Progress Notes (Signed)
ANTICOAGULATION CONSULT NOTE - Initial Consult  Pharmacy Consult for LMWH Indication: VTE prophylaxis  Allergies  Allergen Reactions  . Garamycin [Gentamicin Sulfate] Hives and Shortness Of Breath  . Other Anaphylaxis    HORSE SERUM  . Penicillins Anaphylaxis, Itching and Swelling    Has patient had a PCN reaction causing immediate rash, facial/tongue/throat swelling, SOB or lightheadedness with hypotension: Yes Has patient had a PCN reaction causing severe rash involving mucus membranes or skin necrosis: No Has patient had a PCN reaction that required hospitalization No Has patient had a PCN reaction occurring within the last 10 years: No If all of the above answers are "NO", then may proceed with Cephalosporin use. Reports taking amoxicillin after PCN rxn  . Norvasc [Amlodipine Besylate] Nausea And Vomiting  . Buprenorphine Itching    Patient Measurements: Height: 5\' 8"  (172.7 cm) Weight: 231 lb 7.7 oz (105 kg) IBW/kg (Calculated) : 63.9   Vital Signs: Temp: 98.7 F (37.1 C) (05/06 0904) Temp Source: Oral (05/06 0904) BP: 145/67 (05/06 0904) Pulse Rate: 96 (05/06 0904)  Labs: Recent Labs    08/19/18 0521  HGB 11.2*  HCT 37.6  PLT 273  CREATININE 0.77    Estimated Creatinine Clearance: 91.2 mL/min (by C-G formula based on SCr of 0.77 mg/dL).   Medical History: Past Medical History:  Diagnosis Date  . Arthritis    in back  . Back pain   . Chest pain 06/16/2012   negative cath with normal LV function  . Headache(784.0)   . Hypertension   . 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 Kidney stones   . Stroke [facility])     Medications:  Medications Prior to Admission  Medication Sig Dispense Refill Last Dose  . diclofenac (VOLTAREN) 50 MG EC tablet Take 50 mg by mouth 2 (two) times daily.   0 08/18/2018 at Unknown time  . esomeprazole (NEXIUM) 40 MG capsule Take 40 mg by mouth daily.   08/18/2018 at Unknown time  . guaifenesin  (ROBITUSSIN) 100 MG/5ML syrup Take 200 mg by mouth 3 (three) times daily as needed for cough.   Past Week at Unknown time  . HYDROcodone-acetaminophen (NORCO) 7.5-325 MG tablet Take 1 tablet by mouth every 6 (six) hours as needed (pain).    08/18/2018 at Unknown time  . lisinopril (PRINIVIL,ZESTRIL) 20 MG tablet 20 mg daily.    08/18/2018 at Unknown time  . LYRICA 150 MG capsule Take 150 mg by mouth 2 (two) times daily.  3 08/18/2018 at Unknown time  . mirabegron ER (MYRBETRIQ) 25 MG TB24 tablet Take 25 mg by mouth at bedtime.   08/18/2018 at Unknown time  . rOPINIRole (REQUIP) 2 MG tablet Take 2 mg by mouth 2 (two) times daily.    08/18/2018 at Unknown time  . triamcinolone cream (KENALOG) 0.1 % Apply 1 application topically daily as needed for wound care. Apply to periwound with each dressing change daily   08/18/2018 at Unknown time  . Vitamin D, Ergocalciferol, (DRISDOL) 50000 units CAPS capsule Take 50,000 Units by mouth once a week.  3 08/16/2018  . acetaminophen (TYLENOL) 325 MG tablet Take 2 tablets (650 mg total) by mouth every 4 (four) hours as needed for mild pain (or temp > 37.5 C (99.5 F)). Combination of acetaminophen in vicodin and tylenol should not exceed max dose of 4000mg  daily (Patient not taking: Reported on 11/11/2017)   Not Taking at Unknown time  . levofloxacin (LEVAQUIN) 750  MG tablet Take 1 tablet (750 mg total) by mouth daily. (Patient not taking: Reported on 08/19/2018) 4 tablet 0 Not Taking at Unknown time  . polyethylene glycol (MIRALAX / GLYCOLAX) packet Take 17 g by mouth daily. (Patient not taking: Reported on 08/19/2018) 14 each 0 Not Taking at Unknown time  . potassium chloride (K-DUR) 10 MEQ tablet Take 10 mEq by mouth 2 (two) times daily.     Marland Kitchen. senna-docusate (SENOKOT-S) 8.6-50 MG tablet Take 1 tablet by mouth 2 (two) times daily. (Patient not taking: Reported on 01/15/2017)   Not Taking at Unknown time    Assessment: 63 yo F admitted w/ sepsis 2nd PNA.  Pharmacy consulted to dose  LMWH for VTE px.  Wt 105 kg.  PMH significant for stroke with intracranial hemorrhage and pt reported blood in her urine    Plan:  LMWH 40 qday for VTE px Pharmacy to sign off Thanks Herby AbrahamMichelle T. Bryan Goin, Pharm.D 08/19/2018 9:54 AM

## 2018-08-19 NOTE — Consult Note (Signed)
WOC Nurse wound consult note Reason for Consult: LLE wound, patient follow by wound care center at [facility] Wound type: trauma per patient with history of venous stasis  Pressure Injury POA: NA Measurement: 5cm x 2.5cm x 0.2cm  Wound bed: 100% fibrinous, yellow Drainage (amount, consistency, odor) none Periwound: intact  Dressing procedure/placement/frequency: Patient very adamant on the care for her wound. Discussed use of enzymatic debridement ointment which she refuses. Continue silver hydrofiber with foam topper. Kenalog cream to the periwound per her POC from [facility]. She reports she changes daily.   Orders updated.   Discussed POC with patient and bedside nurse.  Re consult if needed, will not follow at this time. Thanks  Yvette Roark [employer], RN,CWOCN, CNS, CWON-AP [phone])

## 2018-08-19 NOTE — ED Notes (Signed)
Patient had small watery bowel movement. Cleaned patient and changed sheets. Pt tolerated it well.

## 2018-08-19 NOTE — ED Notes (Signed)
Gave report to [place], RN for room [phone].

## 2018-08-19 NOTE — ED Triage Notes (Signed)
Pt reports with generalized weakness, SOB, fever, hematuria and polyuria since 0000 tonight. Pt at 90% on 2L via [place] at home per usual.

## 2018-08-19 NOTE — Progress Notes (Signed)
A consult was received from an ED physician for Vancomycin, aztreonam per pharmacy dosing.  The patient's profile has been reviewed for ht/wt/allergies/indication/available labs.   A one time order has been placed for Vancomycin 2gm iv x1 and Aztreonam 2gm iv x1.  Further antibiotics/pharmacy consults should be ordered by admitting physician if indicated.                       Thank you, Aleene Davidson Crowford 08/19/2018  4:14 AM

## 2018-08-19 NOTE — ED Notes (Signed)
Bed: JK93 Expected date:  Expected time:  Means of arrival:  Comments: EMS: sepsis ? COVID

## 2018-08-19 NOTE — ED Provider Notes (Signed)
[facility]-EMERGENCY DEPT Provider Note  CSN: [id number] Arrival date & time: 08/19/18 0348  Chief Complaint(s) Fever and Altered Mental Status  HPI Heidi Green is a 63 y.o. 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  Patient brought in by EMS who was called out by the patient's husband.  Given patient altered mental status, history was obtained by husband over the phone.  He reported that she was in her normal state of health earlier today.  States that she was working out in the yard.  Several hours ago patient began complaining of feeling hot and noted to have a fever.  She was also complaining of shortness of breath.  Patient had oxygen tank at home from prior episodes of pneumonia and was placed on 2 L nasal cannula by husband.  Husband also reported that the patient was complaining of blood in her urine since yesterday.  Also reported that throughout the day she was complaining of nausea.  He denies any emesis.  No known diarrhea.  He reported nonproductive cough.  Husband reported that the patient has been staying at home mostly during the pandemic.  She has gone to the wound clinic a few times last of which was last week.  No known COVID exposures.  Upon EMS arrival patient was satting 90% on 2 L nasal cannula.  Noted to be tachycardic and hypertensive.  Placed on nonrebreather.  Remainder of history, ROS, and physical exam limited due to patient's condition (AMS). Additional information was obtained from EMS and husband.   Level V Caveat.    HPI  Past Medical History Past Medical History:  Diagnosis Date  . Arthritis    in back  . Back pain   . Chest pain 06/16/2012   negative cath with normal LV function  . Headache(784.0)   . Hypertension   . 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 Kidney stones   .  Stroke [facility])    Patient Active Problem List   Diagnosis Date Noted  . HCAP (healthcare-associated pneumonia) 11/11/2017  . Acute lower UTI 11/11/2017  . AKI (acute kidney injury) (HCC) 11/11/2017  . Lactic acid acidosis 11/11/2017  . Severe sepsis (HCC) 11/11/2017  . CAP (community acquired pneumonia) 10/04/2017  . Class 2 obesity   . HTN (hypertension) 06/16/2012  . Dyslipidemia 06/16/2012  . Back pain 06/16/2012   Home Medication(s) Prior to Admission medications   Medication Sig Start Date End Date Taking? Authorizing Provider  acetaminophen (TYLENOL) 325 MG tablet Take 2 tablets (650 mg total) by mouth every 4 (four) hours as needed for mild pain (or temp > 37.5 C (99.5 F)). Combination of acetaminophen in vicodin and tylenol should not exceed max dose of 4000mg  daily Patient not taking: Reported on 11/11/2017 08/01/16   Layne Benton, NP  atorvastatin (LIPITOR) 40 MG tablet Take 40 mg by mouth daily. 01/13/17   [provider]  diclofenac (VOLTAREN) 50 MG EC tablet TAKE 1 TABLET PO TWICE A DAY 10/11/16   [provider]  DULoxetine (CYMBALTA) 60 MG capsule TAKE 1 CAPSULE PO AT BEDITME 10/11/16   [provider]  esomeprazole (NEXIUM) 20 MG capsule Take 20 mg by mouth daily as needed (GERD).    [provider]  HYDROcodone-acetaminophen (NORCO) 7.5-325 MG tablet Take 1 tablet by mouth every 6 (six) hours  as needed (pain).     [provider]  levofloxacin (LEVAQUIN) 750 MG tablet Take 1 tablet (750 mg total) by mouth daily. 11/14/17   Rolly Salter, MD  lisinopril (PRINIVIL,ZESTRIL) 20 MG tablet 20 mg daily.  01/13/17   [provider]  LYRICA 150 MG capsule Take 150 mg by mouth 2 (two) times daily. 09/06/17   [provider]  mirabegron ER (MYRBETRIQ) 25 MG TB24 tablet Take 25 mg by mouth at bedtime.    [provider]  polyethylene glycol (MIRALAX / GLYCOLAX) packet Take 17 g by mouth daily. 11/14/17   Rolly Salter,  MD  rOPINIRole (REQUIP) 2 MG tablet Take 2 mg by mouth 2 (two) times daily.     [provider]  senna-docusate (SENOKOT-S) 8.6-50 MG tablet Take 1 tablet by mouth 2 (two) times daily. Patient not taking: Reported on 01/15/2017 08/01/16   Layne Benton, NP  Vitamin D, Ergocalciferol, (DRISDOL) 50000 units CAPS capsule Take 50,000 Units by mouth once a week. 10/31/17   [provider]                                                                                                                                    Past Surgical History Past Surgical History:  Procedure Laterality Date  . ABDOMINAL HYSTERECTOMY    . BACK SURGERY     x 3  . LEFT HEART CATHETERIZATION WITH CORONARY ANGIOGRAM N/A 06/16/2012   Procedure: LEFT HEART CATHETERIZATION WITH CORONARY ANGIOGRAM;  Surgeon: Peter M Swaziland, MD;  Location: [facility] CATH LAB;  Service: Cardiovascular;  Laterality: N/A;  . LITHOTRIPSY    . TUBAL LIGATION     Family History Family History  Problem Relation Age of Onset  . Emphysema Mother   . Diabetes Mother     Social History Social History   Tobacco Use  . Smoking status: Former Smoker    Packs/day: 0.50    Years: 26.00    Pack years: 13.00    Types: Cigarettes    Last attempt to quit: 06/17/2012    Years since quitting: 6.1  . Smokeless tobacco: Never Used  Substance Use Topics  . Alcohol use: No  . Drug use: No   Allergies Garamycin [gentamicin sulfate]; Other; Penicillins; and Norvasc [amlodipine besylate]  Review of Systems Review of Systems  Unable to perform ROS: Mental status change    Physical Exam Vital Signs  I have reviewed the triage vital signs BP (!) 151/83   Pulse (!) 117   Temp (!) 103.3 F (39.6 C) (Rectal)   Resp (!) 28   Ht  (1.727 m)   Wt 105 kg   SpO2 97%   BMI 35.20 kg/m   Physical Exam Vitals signs reviewed.  Constitutional:      General: She is not in acute distress.    Appearance: She is well-developed. She is not  diaphoretic.  Comments: Smells of foul urine odor  HENT:     Head: Normocephalic and atraumatic.     Nose: Nose normal.  Eyes:     General: No scleral icterus.       Right eye: No discharge.        Left eye: No discharge.     Conjunctiva/sclera: Conjunctivae normal.     Pupils: Pupils are equal, round, and reactive to light.  Neck:     Musculoskeletal: Normal range of motion and neck supple.  Cardiovascular:     Rate and Rhythm: Regular rhythm. Tachycardia present.     Heart sounds: No murmur. No friction rub. No gallop.   Pulmonary:     Effort: Pulmonary effort is normal. Tachypnea present. No respiratory distress.     Breath sounds: No stridor. Examination of the right-lower field reveals rales. Examination of the left-lower field reveals rales. Rales present.  Abdominal:     General: There is no distension.     Palpations: Abdomen is soft.     Tenderness: There is abdominal tenderness in the suprapubic area. There is no guarding or rebound.  Musculoskeletal:        General: No tenderness.     Right lower leg: 1+ Edema present.     Left lower leg: 1+ Edema present.  Skin:    General: Skin is warm and dry.     Findings: No erythema or rash.       Neurological:     Mental Status: She is alert. She is disoriented.     ED Results and Treatments Labs (all labs ordered are listed, but only abnormal results are displayed) Labs Reviewed  COMPREHENSIVE METABOLIC PANEL - Abnormal; Notable for the following components:      Result Value   Potassium 3.0 (*)    Glucose, Bld 100 (*)    All other components within normal limits  CBC WITH DIFFERENTIAL/PLATELET - Abnormal; Notable for the following components:   WBC 14.3 (*)    Hemoglobin 11.2 (*)    MCV 76.0 (*)    MCH 22.6 (*)    MCHC 29.8 (*)    RDW 17.9 (*)    Neutro Abs 12.6 (*)    Abs Immature Granulocytes 0.09 (*)    All other components within normal limits  URINALYSIS, ROUTINE W REFLEX MICROSCOPIC - Abnormal;  Notable for the following components:   Color, Urine STRAW (*)    All other components within normal limits  SARS CORONAVIRUS 2 (HOSPITAL ORDER, PERFORMED IN [facility])  CULTURE, BLOOD (ROUTINE X 2)  CULTURE, BLOOD (ROUTINE X 2)  URINE CULTURE  LACTIC ACID, PLASMA  LACTIC ACID, PLASMA                                                                                                                         EKG  EKG Interpretation  Date/Time:  Wednesday Aug 19 2018 04:21:31 EDT Ventricular Rate:  112 PR Interval:  QRS Duration: 125 QT Interval:  335 QTC Calculation: 458 R Axis:   -11 Text Interpretation:  Sinus tachycardia Right bundle branch block Otherwise no significant change Confirmed by Drema Pryardama, Lilo Wallington [phone]) on 08/19/2018 6:29:52 AM      Radiology Dg Chest Port 1 View  Result Date: 08/19/2018 CLINICAL DATA:  Fever and hypoxia EXAM: PORTABLE CHEST 1 VIEW COMPARISON:  11/11/2017 FINDINGS: Very low lung volumes with bilateral interstitial and airspace opacity. Vascular pedicle widening accentuated by rotation. No Kerley lines, effusion, or pneumothorax. IMPRESSION: Low volume chest with bilateral opacity/pneumonia. Electronically Signed   By: Marnee SpringJonathon  Watts M.D.   On: 08/19/2018 05:14   Pertinent labs & imaging results that were available during my care of the patient were reviewed by me and considered in my medical decision making (see chart for details).  Medications Ordered in ED Medications  metroNIDAZOLE (FLAGYL) IVPB 500 mg (500 mg Intravenous New Bag/Given 08/19/18 0549)  0.9 %  sodium chloride infusion (has no administration in time range)  vancomycin (VANCOCIN) 2,000 mg in sodium chloride 0.9 % 500 mL IVPB (has no administration in time range)  acetaminophen (TYLENOL) suppository 650 mg (650 mg Rectal Given 08/19/18 0547)  aztreonam (AZACTAM) 2 g in sodium chloride 0.9 % 100 mL IVPB (2 g Intravenous New Bag/Given 08/19/18 0546)  sodium chloride 0.9 % bolus 1,000  mL (1,000 mLs Intravenous New Bag/Given 08/19/18 0547)                                                                                                                                    Procedures Ultrasound ED Peripheral IV (Provider) Date/Time: 08/19/2018 5:23 AM Performed by: Nira Connardama, Adaisha Campise Eduardo, MD Authorized by: Nira Connardama, Otelia Hettinger Eduardo, MD   Procedure details:    Indications: multiple failed IV attempts     Skin Prep: chlorhexidine gluconate     Location:  Left AC   Angiocath:  20 G   Bedside Ultrasound Guided: Yes     Images: archived     Patient tolerated procedure without complications: Yes     Dressing applied: Yes   Ultrasound ED Peripheral IV (Provider) Date/Time: 08/19/2018 5:23 AM Performed by: Nira Connardama, Rutha Melgoza Eduardo, MD Authorized by: Nira Connardama, Neri Vieyra Eduardo, MD   Procedure details:    Indications: multiple failed IV attempts     Skin Prep: chlorhexidine gluconate     Location:  Right forearm   Angiocath:  20 G   Bedside Ultrasound Guided: Yes     Images: archived     Dressing applied: Yes   .Critical Care Performed by: Nira Connardama, Breanda Greenlaw Eduardo, MD Authorized by: Nira Connardama, Kadarius Cuffe Eduardo, MD     CRITICAL CARE Performed by: Amadeo GarnetPedro Eduardo Mikhala Kenan Total critical care time: 45 minutes Critical care time was exclusive of separately billable procedures and treating other patients. Critical care was necessary to treat or prevent imminent or life-threatening deterioration. Critical care was time spent personally by me on the  following activities: development of treatment plan with patient and/or surrogate as well as nursing, discussions with consultants, evaluation of patient's response to treatment, examination of patient, obtaining history from patient or surrogate, ordering and performing treatments and interventions, ordering and review of laboratory studies, ordering and review of radiographic studies, pulse oximetry and re-evaluation of patient's condition.  (including  critical care time)  Medical Decision Making / ED Course I have reviewed the nursing notes for this encounter and the patient's prior records (if available in EHR or on provided paperwork).    Patient noted to be febrile, tachycardic and with altered mental status.  Code sepsis was initiated and patient septic work-up was started.  She was given empiric antibiotics for unknown source but likely from urinary tract infection, but given respiratory symptoms will also cover for respiratory source.  Patient satting well on 4 L nasal cannula.   Multiple failed peripheral IV placements.  Ultrasound PIV's x2 placed by myself.  Labs notable for leukocytosis.  No significant electrolyte derangements or renal insufficiency.  Lactic acid less than 2.  Source appears to be respiratory with bilateral lower lobe pneumonia.  Rapid COVID negative.  UA negative.   Will admit for continued management. Husband updated.  Final Clinical Impression(s) / ED Diagnoses Final diagnoses:  Community acquired bilateral lower lobe pneumonia (HCC)      This chart was dictated using voice recognition software.  Despite best efforts to proofread,  errors can occur which can change the documentation meaning.   Nira Conn, MD 08/19/18 [phone]

## 2018-08-19 NOTE — H&P (Signed)
History and Physical    Heidi Green BJY:[id number] DOB: [birth year] DOA: 08/19/2018  PCP: Cipriano Bunker, MD Patient coming from: Home lives with her husband  Chief Complaint: Fever and cough and shaking HPI: Heidi Green is a 63 y.o. female with medical history significant of stroke with intracranial hemorrhage with right-sided deficits, UTI, AKI, healthcare associated pneumonia, admitted with subjective fever and change in mental status.  History obtained from the records and the patient and the patient's husband.  Patient's husband reported that she had labored breathing and cough and shortness of breath since last night.  Patient had couple of episodes of nausea and vomiting at home.  However the labored breathing and cough had started before the vomiting episode.  They had oxygen at home which she had not been used for months.  She was prescribed oxygen in August 2019 when she was admitted to the hospital with pneumonia.  She has not been needing to use the oxygen since that time.  Husband put her on the oxygen when patient started coughing with labored breathing and fever.  He reported the fever was less than 100 at home.  He denied any sick contacts.  He works from home.  Patient went to wound care doctor in March.  Patient reports that she has a motorized wheelchair that she moves around at home.  She moves around in a regular wheelchair inside the house.  She has right-sided deficits from the stroke and with multiple lower back surgeries her left leg is also weak.  She does not walk at home. when I saw her she was complaining of lower back pain which is chronic.  She reported that she is so weak that she is not able to move her legs.  There was a concern for hematuria husband reported patient said that she has blood in her urine but he is not sure of it.  However UA in the ER came back negative for hematuria. She follows up at [facility] for wound care.  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  She saw her wound care physician on 08/12/2018.  5.5 x 2.5 x 0.2 cm there was a  granulation tissue and necrotic tissue within the wound bed   ED Course:  She received vanco  Flagyl and aztreonam will her temperature was 103.3.  Blood pressure was 162/90 which came down to 98/83 she was 95% on 4 L.  Chest x-ray showed low volume chest with bilateral opacity could be pneumonia.  41 potassium 3.0 BUN 16 creatinine 0.77 AST 29 ALT 22 lactic acid was 1.5 white count 14.3 hemoglobin 11.2 platelet count is 273  Review of Systems: As per HPI otherwise all other systems reviewed and are negative Ambulatory Status: Patient does not walk at home she uses a regular wheelchair at home and a motorized wheelchair when she is out of the house.  Past Medical History:  Diagnosis Date  . Arthritis    in back  . Back pain   . Chest pain 06/16/2012   negative cath with normal LV function  . Headache(784.0)   . Hypertension   . 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 Kidney stones   . Stroke [facility])     Past Surgical History:  Procedure Laterality Date  . ABDOMINAL HYSTERECTOMY    . BACK SURGERY     x 3  .  LEFT HEART CATHETERIZATION WITH CORONARY ANGIOGRAM N/A 06/16/2012   Procedure: LEFT HEART CATHETERIZATION WITH CORONARY ANGIOGRAM;  Surgeon: Peter M SwazilandJordan, MD;  [facility];  Service: Cardiovascular;  Laterality: N/A;  . LITHOTRIPSY    . TUBAL LIGATION      Social History   Socioeconomic History  . Marital status: Married    Spouse name: Brett CanalesSteve  . Number of children: 2  . Years of education: 1914  . Highest education level: Not on file  Occupational History    Comment: retired [employer]  Social Needs  . Financial resource strain: Not on file  . Food insecurity:    Worry: Not on file    Inability: Not on file  . Transportation needs:    Medical: Not on file     Non-medical: Not on file  Tobacco Use  . Smoking status: Former Smoker    Packs/day: 0.50    Years: 26.00    Pack years: 13.00    Types: Cigarettes    Last attempt to quit: 06/17/2012    Years since quitting: 6.1  . Smokeless tobacco: Never Used  Substance and Sexual Activity  . Alcohol use: No  . Drug use: No  . Sexual activity: Not Currently  Lifestyle  . Physical activity:    Days per week: Not on file    Minutes per session: Not on file  . Stress: Not on file  Relationships  . Social connections:    Talks on phone: Not on file    Gets together: Not on file    Attends religious service: Not on file    Active member of club or organization: Not on file    Attends meetings of clubs or organizations: Not on file    Relationship status: Not on file  . Intimate partner violence:    Fear of current or ex partner: Not on file    Emotionally abused: Not on file    Physically abused: Not on file    Forced sexual activity: Not on file  Other Topics Concern  . Not on file  Social History Narrative   Lives at home with husband    Allergies  Allergen Reactions  . Garamycin [Gentamicin Sulfate] Hives and Shortness Of Breath  . Other Anaphylaxis    HORSE SERUM  . Penicillins Anaphylaxis, Itching and Swelling    Has patient had a PCN reaction causing immediate rash, facial/tongue/throat swelling, SOB or lightheadedness with hypotension: Yes Has patient had a PCN reaction causing severe rash involving mucus membranes or skin necrosis: No Has patient had a PCN reaction that required hospitalization No Has patient had a PCN reaction occurring within the last 10 years: No If all of the above answers are "NO", then may proceed with Cephalosporin use. Reports taking amoxicillin after PCN rxn  . Norvasc [Amlodipine Besylate] Nausea And Vomiting  . Buprenorphine Itching    Family History  Problem Relation Age of Onset  . Emphysema Mother   . Diabetes Mother       Prior to  Admission medications   Medication Sig Start Date End Date Taking? Authorizing Provider  acetaminophen (TYLENOL) 325 MG tablet Take 2 tablets (650 mg total) by mouth every 4 (four) hours as needed for mild pain (or temp > 37.5 C (99.5 F)). Combination of acetaminophen in vicodin and tylenol should not exceed max dose of 4000mg  daily Patient not taking: Reported on 11/11/2017 08/01/16   Layne BentonBiby, Sharon L, NP  atorvastatin (LIPITOR) 40 MG tablet  Take 40 mg by mouth daily. 01/13/17   [provider]  diclofenac (VOLTAREN) 50 MG EC tablet TAKE 1 TABLET PO TWICE A DAY 10/11/16   [provider]  DULoxetine (CYMBALTA) 60 MG capsule TAKE 1 CAPSULE PO AT BEDITME 10/11/16   [provider]  esomeprazole (NEXIUM) 20 MG capsule Take 20 mg by mouth daily as needed (GERD).    [provider]  esomeprazole (NEXIUM) 40 MG capsule Take 40 mg by mouth daily. 08/18/18   [provider]  HYDROcodone-acetaminophen (NORCO) 7.5-325 MG tablet Take 1 tablet by mouth every 6 (six) hours as needed (pain).     [provider]  levofloxacin (LEVAQUIN) 750 MG tablet Take 1 tablet (750 mg total) by mouth daily. 11/14/17   Rolly Salter, MD  lisinopril (PRINIVIL,ZESTRIL) 20 MG tablet 20 mg daily.  01/13/17   [provider]  LYRICA 150 MG capsule Take 150 mg by mouth 2 (two) times daily. 09/06/17   [provider]  mirabegron ER (MYRBETRIQ) 25 MG TB24 tablet Take 25 mg by mouth at bedtime.    [provider]  polyethylene glycol (MIRALAX / GLYCOLAX) packet Take 17 g by mouth daily. 11/14/17   Rolly Salter, MD  potassium chloride (K-DUR) 10 MEQ tablet Take 10 mEq by mouth 2 (two) times daily. 05/29/18   [provider]  rOPINIRole (REQUIP) 2 MG tablet Take 2 mg by mouth 2 (two) times daily.     [provider]  senna-docusate (SENOKOT-S) 8.6-50 MG tablet Take 1 tablet by mouth 2 (two) times daily. Patient not taking: Reported on 01/15/2017  08/01/16   Layne Benton, NP  triamcinolone cream (KENALOG) 0.1 % Apply 1 application topically daily as needed for wound care. Apply to periwound with each dressing change daily 08/12/18   [provider]  Vitamin D, Ergocalciferol, (DRISDOL) 50000 units CAPS capsule Take 50,000 Units by mouth once a week. 10/31/17   [provider]    Physical Exam: Vitals:   08/19/18 0415 08/19/18 0445 08/19/18 0626 08/19/18 0700  BP: (!) 162/90 (!) 151/83 (!) 154/84 (!) 178/88  Pulse: (!) 116 (!) 117 (!) 108 (!) 106  Resp: (!) 28 (!) 28 (!) 31 (!) 22  Temp: (!) 103.3 F (39.6 C)  (!) 102 F (38.9 C)   TempSrc: Rectal  Oral   SpO2: 92% 97% 99% 95%  Weight:      Height:         . General:  Appears in mild distress due to back pain . Eyes:  grossly normal hearing, lips & tongue, dry . Neck: no LAD, masses or thyromegaly . Cardiovascular:  RRR, no m/r/g. No LE edema.  Marland Kitchen Respiratory: Bilateral rhonchi , no w/r/r. Normal respiratory effort. . Abdomen:  soft, NABS generalized abdominal tenderness . Skin:  no rash or induration seen on limited exam . Musculoskeletal: 1+ lower extremity edema chronic dressing on the left lower leg at the site of wound . Psychiatric:  grossly normal mood and affect, speech fluent and appropriate, AOx3 Neurologic:  CN 2-12 grossly intact, wiggles her toes Labs on Admission: I have personally reviewed following labs and imaging studies  CBC: Recent Labs  Lab 08/19/18 0521  WBC 14.3*  NEUTROABS 12.6*  HGB 11.2*  HCT 37.6  MCV 76.0*  PLT 273   Basic Metabolic Panel: Recent Labs  Lab 08/19/18 0521  NA 141  K 3.0*  CL 106  CO2 24  GLUCOSE 100*  BUN  16  CREATININE 0.77  CALCIUM 9.0   GFR: Estimated Creatinine Clearance: 91.2 mL/min (by C-G formula based on SCr of 0.77 mg/dL). Liver Function Tests: Recent Labs  Lab 08/19/18 0521  AST 29  ALT 22  ALKPHOS 93  BILITOT 0.6  PROT 8.0  ALBUMIN 4.2   No results for input(s): LIPASE,  AMYLASE in the last 168 hours. No results for input(s): AMMONIA in the last 168 hours. Coagulation Profile: No results for input(s): INR, PROTIME in the last 168 hours. Cardiac Enzymes: No results for input(s): CKTOTAL, CKMB, CKMBINDEX, TROPONINI in the last 168 hours. BNP (last 3 results) No results for input(s): PROBNP in the last 8760 hours. HbA1C: No results for input(s): HGBA1C in the last 72 hours. CBG: No results for input(s): GLUCAP in the last 168 hours. Lipid Profile: No results for input(s): CHOL, HDL, LDLCALC, TRIG, CHOLHDL, LDLDIRECT in the last 72 hours. Thyroid Function Tests: No results for input(s): TSH, T4TOTAL, FREET4, T3FREE, THYROIDAB in the last 72 hours. Anemia Panel: No results for input(s): VITAMINB12, FOLATE, FERRITIN, TIBC, IRON, RETICCTPCT in the last 72 hours. Urine analysis:    Component Value Date/Time   COLORURINE STRAW (A) 08/19/2018 0402   APPEARANCEUR CLEAR 08/19/2018 0402   LABSPEC 1.012 08/19/2018 0402   PHURINE 6.0 08/19/2018 0402   GLUCOSEU NEGATIVE 08/19/2018 0402   HGBUR NEGATIVE 08/19/2018 0402   BILIRUBINUR NEGATIVE 08/19/2018 0402   KETONESUR NEGATIVE 08/19/2018 0402   PROTEINUR NEGATIVE 08/19/2018 0402   UROBILINOGEN 1.0 02/28/2008 1445   NITRITE NEGATIVE 08/19/2018 0402   LEUKOCYTESUR NEGATIVE 08/19/2018 0402    Creatinine Clearance: Estimated Creatinine Clearance: 91.2 mL/min (by C-G formula based on SCr of 0.77 mg/dL).  Sepsis Labs: @LABRCNTIP (procalcitonin:4,lacticidven:4) ) Recent Results (from the past 240 hour(s))  SARS Coronavirus 2 [facility] order, Performed in [facility] hospital lab)     Status: None   Collection Time: 08/19/18  4:08 AM  Result Value Ref Range Status   SARS Coronavirus 2 NEGATIVE NEGATIVE Final    Comment: (NOTE) If result is NEGATIVE SARS-CoV-2 target nucleic acids are NOT DETECTED. The SARS-CoV-2 RNA is generally detectable in upper and lower  respiratory specimens during the acute phase of  infection. The lowest  concentration of SARS-CoV-2 viral copies this assay can detect is 250  copies / mL. A negative result does not preclude SARS-CoV-2 infection  and should not be used as the sole basis for treatment or other  patient management decisions.  A negative result may occur with  improper specimen collection / handling, submission of specimen other  than nasopharyngeal swab, presence of viral mutation(s) within the  areas targeted by this assay, and inadequate number of viral copies  (<250 copies / mL). A negative result must be combined with clinical  observations, patient history, and epidemiological information. If result is POSITIVE SARS-CoV-2 target nucleic acids are DETECTED. The SARS-CoV-2 RNA is generally detectable in upper and lower  respiratory specimens dur ing the acute phase of infection.  Positive  results are indicative of active infection with SARS-CoV-2.  Clinical  correlation with patient history and other diagnostic information is  necessary to determine patient infection status.  Positive results do  not rule out bacterial infection or co-infection with other viruses. If result is PRESUMPTIVE POSTIVE SARS-CoV-2 nucleic acids MAY BE PRESENT.   A presumptive positive result was obtained on the submitted specimen  and confirmed on repeat testing.  While 2019 novel coronavirus  (SARS-CoV-2) nucleic acids may be present in the submitted  sample  additional confirmatory testing may be necessary for epidemiological  and / or clinical management purposes  to differentiate between  SARS-CoV-2 and other Sarbecovirus currently known to infect humans.  If clinically indicated additional testing with an alternate test  methodology [phone]) is advised. The SARS-CoV-2 RNA is generally  detectable in upper and lower respiratory sp ecimens during the acute  phase of infection. The expected result is Negative. Fact Sheet for Patients:   BoilerBrush.com.cy Fact Sheet for Healthcare Providers: https://pope.com/ This test is not yet approved or cleared by the [place] FDA and has been authorized for detection and/or diagnosis of SARS-CoV-2 by FDA under an Emergency Use Authorization (EUA).  This EUA will remain in effect (meaning this test can be used) for the duration of the COVID-19 declaration under Section 564(b)(1) of the Act, 21 U.S.C. section 360bbb-3(b)(1), unless the authorization is terminated or revoked sooner. Performed at [facility], [address], [place], [place] [address]      Radiological Exams on Admission: Dg Chest Port 1 View  Result Date: 08/19/2018 CLINICAL DATA:  Fever and hypoxia EXAM: PORTABLE CHEST 1 VIEW COMPARISON:  11/11/2017 FINDINGS: Very low lung volumes with bilateral interstitial and airspace opacity. Vascular pedicle widening accentuated by rotation. No Kerley lines, effusion, or pneumothorax. IMPRESSION: Low volume chest with bilateral opacity/pneumonia. Electronically Signed   By: Marnee Spring M.D.   On: 08/19/2018 05:14    Assessment/Plan Active Problems:   Sepsis due to pneumonia (HCC)   #1 sepsis secondary to CAP- patient presented with fever shortness of breath and cough and tremors.  She was found to be tachycardic tachypneic hypotensive and had a temp of 103.3 while in the ER.  COVID ruled out.  Check flu panel and respiratory virus panel.  Obtain blood cultures procalcitonin.  Check MRSA PCR.  I will treat her with levofloxacin and with Romycin.  Patient has anaphylactic reaction to penicillin.  She received Vanco and aztreonam in the ER.  Follow-up blood cultures.  UA negative.  Patient does not use oxygen at home though she has oxygen at home since her last hospital stay in September 2019.  This patient meets inpatient criteria due to sepsis with hypotension fever tachypnea tachycardia with leukocytosis and  findings of pneumonia and hypoxia.  #2  Chronic back pain with 3 back surgeries patient has spinal tenderness to lower lumbar sacral spine area which is chronic.  Continue home meds.  Norco, Lyrica, Requip.  She reported she has a lot of pain and is too weak to move her lower extremities.  At baseline the right lower extremity is weaker than the left.  #3 hypertension blood pressure soft hold lisinopril  #4 hypokalemia replete and recheck.  #5 chronic left lower extremity wound followed at wound clinic at [facility] continue Aquacel to the wound and change dressing daily with triamcinolone to the periwound area.  Estimated body mass index is 35.2 kg/m as calculated from the following:   Height as of this encounter:  (1.727 m).   Weight as of this encounter: 105 kg.   DVT prophylaxis: Lovenox Code Status: Full code Family Communication: Discussed with husband at [phone]  disposition Plan: Pending clinical improvement Consults called: None Admission status: Inpatient   Alwyn Ren MD [facility]  If 7PM-7AM, please contact night-coverage [email or web address] Password [email or web address]  08/19/2018, 8:08 AM

## 2018-08-19 NOTE — ED Notes (Signed)
ED TO INPATIENT HANDOFF REPORT  ED Nurse Name and Phone #:  Terrilyn Saver [phone]  S Name/Age/Gender Heidi Green 63 y.o. female Room/Bed: WA12/WA12  Code Status   Code Status: Prior  Home/SNF/Other Home Patient oriented to: self, place, time and situation Is this baseline? Yes   Triage Complete: Triage complete  Chief Complaint Fever; Altered Mental Status  Triage Note Pt reports with generalized weakness, SOB, fever, hematuria and polyuria since 0000 tonight. Pt at 90% on 2L via [place] at home per usual.    Allergies Allergies  Allergen Reactions  . Garamycin [Gentamicin Sulfate] Hives and Shortness Of Breath  . Other Anaphylaxis    HORSE SERUM  . Penicillins Anaphylaxis, Itching and Swelling    Has patient had a PCN reaction causing immediate rash, facial/tongue/throat swelling, SOB or lightheadedness with hypotension: Yes Has patient had a PCN reaction causing severe rash involving mucus membranes or skin necrosis: No Has patient had a PCN reaction that required hospitalization No Has patient had a PCN reaction occurring within the last 10 years: No If all of the above answers are "NO", then may proceed with Cephalosporin use. Reports taking amoxicillin after PCN rxn  . Norvasc [Amlodipine Besylate] Nausea And Vomiting    Level of Care/Admitting Diagnosis ED Disposition    ED Disposition Condition Comment   Admit  Hospital Area: [facility] [[id number]]  Level of Care: Telemetry [5]  Admit to tele based on following criteria: Complex arrhythmia (Bradycardia/Tachycardia)  Covid Evaluation: N/A  Diagnosis: Sepsis due to pneumonia [facility]) [[id number]]  Admitting Physician: Briscoe Deutscher [[id number]]  Attending Physician: Briscoe Deutscher [[id number]]  Estimated length of stay: past midnight tomorrow  Certification:: I certify this patient will need inpatient services for at least 2 midnights  PT Class (Do Not Modify): Inpatient [101]  PT Acc Code (Do Not  Modify): Private [1]       B Medical/Surgery History Past Medical History:  Diagnosis Date  . Arthritis    in back  . Back pain   . Chest pain 06/16/2012   negative cath with normal LV function  . Headache(784.0)   . Hypertension   . 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 Kidney stones   . Stroke [facility])    Past Surgical History:  Procedure Laterality Date  . ABDOMINAL HYSTERECTOMY    . BACK SURGERY     x 3  . LEFT HEART CATHETERIZATION WITH CORONARY ANGIOGRAM N/A 06/16/2012   Procedure: LEFT HEART CATHETERIZATION WITH CORONARY ANGIOGRAM;  Surgeon: Peter M Swaziland, MD;  Location: [facility] CATH LAB;  Service: Cardiovascular;  Laterality: N/A;  . LITHOTRIPSY    . TUBAL LIGATION       A IV Location/Drains/Wounds Patient Lines/Drains/Airways Status   Active Line/Drains/Airways    Name:   Placement date:   Placement time:   Site:   Days:   Peripheral IV 08/19/18 Left;Upper Arm   08/19/18    0510    Arm   less than 1   Peripheral IV 08/19/18 Right Forearm   08/19/18    0520    Forearm   less than 1   Wound / Incision (Open or Dehisced) 12/31/17 Incision - Dehisced Leg Left    12/31/17    2049    Leg   231          Intake/Output Last 24 hours No intake or output data in the 24 hours ending 08/19/18 0745  Labs/Imaging Results for orders placed or performed during the hospital encounter of 08/19/18 (from the past 48 hour(s))  Urinalysis, Routine w reflex microscopic     Status: Abnormal   Collection Time: 08/19/18  4:02 AM  Result Value Ref Range   Color, Urine STRAW (A) YELLOW   APPearance CLEAR CLEAR   Specific Gravity, Urine 1.012 1.005 - 1.030   pH 6.0 5.0 - 8.0   Glucose, UA NEGATIVE NEGATIVE mg/dL   Hgb urine dipstick NEGATIVE NEGATIVE   Bilirubin Urine NEGATIVE NEGATIVE   Ketones, ur NEGATIVE NEGATIVE mg/dL   Protein, ur NEGATIVE NEGATIVE mg/dL   Nitrite NEGATIVE NEGATIVE   Leukocytes,Ua NEGATIVE NEGATIVE    Comment: Performed  at [facility], [address] [place], [place] [address]  SARS Coronavirus 2 [facility](Hospital order, Performed in [facility] hospital lab)     Status: None   Collection Time: 08/19/18  4:08 AM  Result Value Ref Range   SARS Coronavirus 2 NEGATIVE NEGATIVE    Comment: (NOTE) If result is NEGATIVE SARS-CoV-2 target nucleic acids are NOT DETECTED. The SARS-CoV-2 RNA is generally detectable in upper and lower  respiratory specimens during the acute phase of infection. The lowest  concentration of SARS-CoV-2 viral copies this assay can detect is 250  copies / mL. A negative result does not preclude SARS-CoV-2 infection  and should not be used as the sole basis for treatment or other  patient management decisions.  A negative result may occur with  improper specimen collection / handling, submission of specimen other  than nasopharyngeal swab, presence of viral mutation(s) within the  areas targeted by this assay, and inadequate number of viral copies  (<250 copies / mL). A negative result must be combined with clinical  observations, patient history, and epidemiological information. If result is POSITIVE SARS-CoV-2 target nucleic acids are DETECTED. The SARS-CoV-2 RNA is generally detectable in upper and lower  respiratory specimens dur ing the acute phase of infection.  Positive  results are indicative of active infection with SARS-CoV-2.  Clinical  correlation with patient history and other diagnostic information is  necessary to determine patient infection status.  Positive results do  not rule out bacterial infection or co-infection with other viruses. If result is PRESUMPTIVE POSTIVE SARS-CoV-2 nucleic acids MAY BE PRESENT.   A presumptive positive result was obtained on the submitted specimen  and confirmed on repeat testing.  While 2019 novel coronavirus  (SARS-CoV-2) nucleic acids may be present in the submitted sample  additional confirmatory testing may be  necessary for epidemiological  and / or clinical management purposes  to differentiate between  SARS-CoV-2 and other Sarbecovirus currently known to infect humans.  If clinically indicated additional testing with an alternate test  methodology [phone] is advised. The SARS-CoV-2 RNA is generally  detectable in upper and lower respiratory sp ecimens during the acute  phase of infection. The expected result is Negative. Fact Sheet for Patients:  BoilerBrush.com.cyhttps://www.fda.gov/media/136312/download Fact Sheet for Healthcare Providers: https://pope.com/https://www.fda.gov/media/136313/download This test is not yet approved or cleared by the [place] FDA and has been authorized for detection and/or diagnosis of SARS-CoV-2 by FDA under an Emergency Use Authorization (EUA).  This EUA will remain in effect (meaning this test can be used) for the duration of the COVID-19 declaration under Section 564(b)(1) of the Act, 21 U.S.C. section 360bbb-3(b)(1), unless the authorization is terminated or revoked sooner. Performed at [facility], [address] [place], [place] [address]   Lactic acid, plasma  Status: None   Collection Time: 08/19/18  5:21 AM  Result Value Ref Range   Lactic Acid, Venous 1.5 0.5 - 1.9 mmol/L    Comment: Performed at [facility], [address], [place], [place] [address]  Comprehensive metabolic panel     Status: Abnormal   Collection Time: 08/19/18  5:21 AM  Result Value Ref Range   Sodium 141 135 - 145 mmol/L   Potassium 3.0 (L) 3.5 - 5.1 mmol/L   Chloride 106 98 - 111 mmol/L   CO2 24 22 - 32 mmol/L   Glucose, Bld 100 (H) 70 - 99 mg/dL   BUN 16 8 - 23 mg/dL   Creatinine, Ser 1.85 0.44 - 1.00 mg/dL   Calcium 9.0 8.9 - 63.1 mg/dL   Total Protein 8.0 6.5 - 8.1 g/dL   Albumin 4.2 3.5 - 5.0 g/dL   AST 29 15 - 41 U/L   ALT 22 0 - 44 U/L   Alkaline Phosphatase 93 38 - 126 U/L   Total Bilirubin 0.6 0.3 - 1.2 mg/dL   GFR calc non Af Amer >60 >60  mL/min   GFR calc Af Amer >60 >60 mL/min   Anion gap 11 5 - 15    Comment: Performed at [facility], [address], [place], [place] [address]  CBC WITH DIFFERENTIAL     Status: Abnormal   Collection Time: 08/19/18  5:21 AM  Result Value Ref Range   WBC 14.3 (H) 4.0 - 10.5 K/uL   RBC 4.95 3.87 - 5.11 MIL/uL   Hemoglobin 11.2 (L) 12.0 - 15.0 g/dL   HCT 63.7 85.8 - 85.0 %   MCV 76.0 (L) 80.0 - 100.0 fL   MCH 22.6 (L) 26.0 - 34.0 pg   MCHC 29.8 (L) 30.0 - 36.0 g/dL   RDW 27.7 (H) 41.2 - 87.8 %   Platelets 273 150 - 400 K/uL   nRBC 0.0 0.0 - 0.2 %   Neutrophils Relative % 89 %   Neutro Abs 12.6 (H) 1.7 - 7.7 K/uL   Lymphocytes Relative 6 %   Lymphs Abs 0.9 0.7 - 4.0 K/uL   Monocytes Relative 4 %   Monocytes Absolute 0.6 0.1 - 1.0 K/uL   Eosinophils Relative 0 %   Eosinophils Absolute 0.0 0.0 - 0.5 K/uL   Basophils Relative 0 %   Basophils Absolute 0.0 0.0 - 0.1 K/uL   Immature Granulocytes 1 %   Abs Immature Granulocytes 0.09 (H) 0.00 - 0.07 K/uL    Comment: Performed at [facility], [address], [place], [place] [address]   Dg Chest Port 1 View  Result Date: 08/19/2018 CLINICAL DATA:  Fever and hypoxia EXAM: PORTABLE CHEST 1 VIEW COMPARISON:  11/11/2017 FINDINGS: Very low lung volumes with bilateral interstitial and airspace opacity. Vascular pedicle widening accentuated by rotation. No Kerley lines, effusion, or pneumothorax. IMPRESSION: Low volume chest with bilateral opacity/pneumonia. Electronically Signed   By: Marnee Spring M.D.   On: 08/19/2018 05:14    Pending Labs Unresulted Labs (From admission, onward)    Start     Ordered   08/19/18 0406  Urine culture  ONCE - STAT,   STAT    Question:  Patient immune status  Answer:  Normal   08/19/18 0408   08/19/18 0402  Lactic acid, plasma  Now then every 2 hours,   STAT     08/19/18 0408   08/19/18 0402  Blood Culture (routine x 2)  BLOOD CULTURE X 2,   STAT    Question:  Patient  immune status  Answer:  Normal   08/19/18 0408          Vitals/Pain Today's Vitals   08/19/18 0445 08/19/18 0626 08/19/18 0651 08/19/18 0700  BP: (!) 151/83 (!) 154/84  (!) 178/88  Pulse: (!) 117 (!) 108  (!) 106  Resp: (!) 28 (!) 31  (!) 22  Temp:  (!) 102 F (38.9 C)    TempSrc:  Oral    SpO2: 97% 99%  95%  Weight:      Height:      PainSc:   6      Isolation Precautions No active isolations  Medications Medications  0.9 %  sodium chloride infusion (has no administration in time range)  vancomycin (VANCOCIN) 2,000 mg in sodium chloride 0.9 % 500 mL IVPB (2,000 mg Intravenous New Bag/Given 08/19/18 0635)  acetaminophen (TYLENOL) suppository 650 mg (650 mg Rectal Given 08/19/18 0547)  aztreonam (AZACTAM) 2 g in sodium chloride 0.9 % 100 mL IVPB (0 g Intravenous Stopped 08/19/18 1610)  metroNIDAZOLE (FLAGYL) IVPB 500 mg (500 mg Intravenous New Bag/Given 08/19/18 0549)  sodium chloride 0.9 % bolus 1,000 mL (1,000 mLs Intravenous New Bag/Given 08/19/18 0547)    Mobility walks High fall risk   Focused Assessments N/A    R Recommendations: See Admitting Provider Note  Report given to:   Additional Notes: N/A

## 2018-08-20 DIAGNOSIS — E876 Hypokalemia: Secondary | ICD-10-CM

## 2018-08-20 DIAGNOSIS — M545 Low back pain: Secondary | ICD-10-CM

## 2018-08-20 DIAGNOSIS — J189 Pneumonia, unspecified organism: Secondary | ICD-10-CM

## 2018-08-20 DIAGNOSIS — A419 Sepsis, unspecified organism: Principal | ICD-10-CM

## 2018-08-20 DIAGNOSIS — J9601 Acute respiratory failure with hypoxia: Secondary | ICD-10-CM

## 2018-08-20 DIAGNOSIS — G8929 Other chronic pain: Secondary | ICD-10-CM

## 2018-08-20 LAB — COMPREHENSIVE METABOLIC PANEL
ALT: 16 U/L (ref 0–44)
AST: 20 U/L (ref 15–41)
Albumin: 3.1 g/dL — ABNORMAL LOW (ref 3.5–5.0)
Alkaline Phosphatase: 63 U/L (ref 38–126)
Anion gap: 6 (ref 5–15)
BUN: 15 mg/dL (ref 8–23)
CO2: 22 mmol/L (ref 22–32)
Calcium: 8 mg/dL — ABNORMAL LOW (ref 8.9–10.3)
Chloride: 112 mmol/L — ABNORMAL HIGH (ref 98–111)
Creatinine, Ser: 0.65 mg/dL (ref 0.44–1.00)
GFR calc Af Amer: >60 mL/min (ref >60)
GFR calc non Af Amer: >60 mL/min (ref >60)
Glucose, Bld: 100 mg/dL — ABNORMAL HIGH (ref 70–99)
Potassium: 3.5 mmol/L (ref 3.5–5.1)
Sodium: 140 mmol/L (ref 135–145)
Total Bilirubin: 0.6 mg/dL (ref 0.3–1.2)
Total Protein: 6.2 g/dL — ABNORMAL LOW (ref 6.5–8.1)

## 2018-08-20 LAB — CBC
HCT: 29.9 % — ABNORMAL LOW (ref 36.0–46.0)
Hemoglobin: 9 g/dL — ABNORMAL LOW (ref 12.0–15.0)
MCH: 23.3 pg — ABNORMAL LOW (ref 26.0–34.0)
MCHC: 30.1 g/dL (ref 30.0–36.0)
MCV: 77.3 fL — ABNORMAL LOW (ref 80.0–100.0)
Platelets: 227 10*3/uL (ref 150–400)
RBC: 3.87 MIL/uL (ref 3.87–5.11)
RDW: 18.3 % — ABNORMAL HIGH (ref 11.5–15.5)
WBC: 18 10*3/uL — ABNORMAL HIGH (ref 4.0–10.5)
nRBC: 0 % (ref 0.0–0.2)

## 2018-08-20 NOTE — Progress Notes (Addendum)
PROGRESS NOTE    Heidi BarmanLisa M Green  QMV:[id number]:[id number] DOB: 07/20/1955 DOA: 08/19/2018 PCP: Cipriano BunkerKumar, Pardeep, MD    Brief Narrative:  63 yo female who presented with fever, cough and shaking. She has the past medical history of hemorrhagic CVA with right sided deficit.  Reported a fairly acute onset of shortness of breath and cough which became worsening.  On her initial physical examination her blood pressure was 162/90, heart rate 116, respiratory rate 28-31, temperature 39.6 C, oxygen saturation 97% on supplemental oxygen.  Her lungs had bilateral rhonchi, heart S1-S2 present rhythmic, the abdomen soft nontender, no lower extremity edema, she had a wound on the left leg with dressing in place.  Sodium 141, potassium 3.0, chloride 106, bicarb 24, glucose 100, BUN 16, creatinine 0.77, white count 14.3, hemoglobin 11.2, hematocrit 37.6, platelets 273.  SARS COVID-19 negative, influenza A/B, respiratory viral panel negative.  Urine analysis negative for infection.  Her chest film had right rotation, hypoinflation, alveolar infiltrate at the right middle lobe and left lower lobe, positive scoliosis.  EKG 112 bpm, normal axis, sinus rhythm with a right bundle branch block, no significant ST segment or T wave changes.   Patient was admitted to the hospital with working diagnosis of acute hypoxic respiratory failure due to community acquired pneumonia, right middle lobe and left lower lobe.  Assessment & Plan:   Active Problems:   Hypokalemia   Community acquired bilateral lower lobe pneumonia (HCC)   Sepsis due to pneumonia (HCC)   Hypotension   Chronic low back pain   1. Acute hypoxic respiratory failure, due to community acquired pneumonia, suspected aspiration pneumonia, right middle lobe and left lower lobe/ complicated with sepsis (present on admission). T max 38 C on admission, oxygenation today is 97% on room air, wbc is worsening to 18 from 14. Patient did not receive steroids on admission, record  personally reviewed. Chest film with bilateral pulmonary infiltrates. Will continue antibiotic therapy with levofloxacin, patient allergic to penicillin. Continue to follow up on cultures and cell count. Will stop IV fluids for now, follow a restrictive IV fluids strategy.   2. Hx of hemorrhagic CVA. Patient with right sided weakness, uses motorized scooter at home. She declines swallow evaluation, denies difficulty swallowing.   3. Chronic back pain. Continue pain control.  4. Right leg wound. Ulcerated wound, oval shape, no signs of local infection, continue local wound care.   5. HTN. contionue to hold lisinopril for now, to prevent hypotension.   6. Hypokalemia. Continue K correction with Kcl, K today is 3,5, renal function preserved with serum cr at 0,65.   7, Obesity. Calculated BMI is 35,2  DVT prophylaxis: enoxaparin   Code Status:  full Family Communication: no family at the bedside  Disposition Plan/ discharge barriers: pending clinical improvement  Body mass index is 35.2 kg/m. Malnutrition Type:      Malnutrition Characteristics:      Nutrition Interventions:     RN Pressure Injury Documentation:     Consultants:     Procedures:     Antimicrobials:   Levofloxacin IV    Subjective: Patient is feeling better, but not yet back to baseline wbc count. No nausea or vomiting, denies any chest pain or dyspnea, she is very anxious about being discharged.   Objective: Vitals:   08/19/18 0809 08/19/18 0904 08/19/18 2039 08/20/18 0539  BP:  (!) 145/67 (!) 116/56 138/79  Pulse:  96 85 75  Resp:  20 16 16   Temp: (!) 100.4  F (38 C) 98.7 F (37.1 C) 98.7 F (37.1 C) 98.4 F (36.9 C)  TempSrc: Oral Oral Oral Oral  SpO2:  95% 95% 96%  Weight:      Height:        Intake/Output Summary (Last 24 hours) at 08/20/2018 1336 Last data filed at 08/20/2018 1000 Gross per 24 hour  Intake 2792.35 ml  Output -  Net 2792.35 ml   Filed Weights   08/19/18 0413   Weight: 105 kg    Examination:   General: deconditioned  Neurology: Awake and alert, non focal  E ENT: mild pallor, no icterus, oral mucosa moist Cardiovascular: No JVD. S1-S2 present, rhythmic, no gallops, rubs, or murmurs. No lower extremity edema. Pulmonary: positive breath sounds bilaterally, decreased air movement, no wheezing or rhonchi, but positive right base rales. Gastrointestinal. Abdomen with no organomegaly, non tender, no rebound or guarding Skin. No rashes Musculoskeletal: no joint deformities     Data Reviewed: I have personally reviewed following labs and imaging studies  CBC: Recent Labs  Lab 08/19/18 0521 08/20/18 0440  WBC 14.3* 18.0*  NEUTROABS 12.6*  --   HGB 11.2* 9.0*  HCT 37.6 29.9*  MCV 76.0* 77.3*  PLT 273 227   Basic Metabolic Panel: Recent Labs  Lab 08/19/18 0521 08/20/18 0440  NA 141 140  K 3.0* 3.5  CL 106 112*  CO2 24 22  GLUCOSE 100* 100*  BUN 16 15  CREATININE 0.77 0.65  CALCIUM 9.0 8.0*   GFR: Estimated Creatinine Clearance: 91.2 mL/min (by C-G formula based on SCr of 0.65 mg/dL). Liver Function Tests: Recent Labs  Lab 08/19/18 0521 08/20/18 0440  AST 29 20  ALT 22 16  ALKPHOS 93 63  BILITOT 0.6 0.6  PROT 8.0 6.2*  ALBUMIN 4.2 3.1*   No results for input(s): LIPASE, AMYLASE in the last 168 hours. No results for input(s): AMMONIA in the last 168 hours. Coagulation Profile: No results for input(s): INR, PROTIME in the last 168 hours. Cardiac Enzymes: No results for input(s): CKTOTAL, CKMB, CKMBINDEX, TROPONINI in the last 168 hours. BNP (last 3 results) No results for input(s): PROBNP in the last 8760 hours. HbA1C: No results for input(s): HGBA1C in the last 72 hours. CBG: No results for input(s): GLUCAP in the last 168 hours. Lipid Profile: No results for input(s): CHOL, HDL, LDLCALC, TRIG, CHOLHDL, LDLDIRECT in the last 72 hours. Thyroid Function Tests: No results for input(s): TSH, T4TOTAL, FREET4, T3FREE,  THYROIDAB in the last 72 hours. Anemia Panel: No results for input(s): VITAMINB12, FOLATE, FERRITIN, TIBC, IRON, RETICCTPCT in the last 72 hours.    Radiology Studies: I have reviewed all of the imaging during this hospital visit personally     Scheduled Meds: . enoxaparin (LOVENOX) injection  40 mg Subcutaneous Q24H  . mirabegron ER  25 mg Oral QHS  . pantoprazole  40 mg Oral Daily  . potassium chloride  10 mEq Oral BID  . pregabalin  150 mg Oral BID  . rOPINIRole  2 mg Oral BID  . triamcinolone cream  1 application Topical BID  . [START ON 08/23/2018] Vitamin D (Ergocalciferol)  50,000 Units Oral Weekly   Continuous Infusions: . sodium chloride 1,000 mL (08/20/18 0534)  . levofloxacin (LEVAQUIN) IV 500 mg (08/20/18 0900)     LOS: 1 day        Sharryn Belding Annett Gula, MD

## 2018-08-21 LAB — CBC WITH DIFFERENTIAL/PLATELET
Abs Immature Granulocytes: 0.05 10*3/uL (ref 0.00–0.07)
Basophils Absolute: 0.1 10*3/uL (ref 0.0–0.1)
Basophils Relative: 1 %
Eosinophils Absolute: 0.3 10*3/uL (ref 0.0–0.5)
Eosinophils Relative: 3 %
HCT: 36.5 % (ref 36.0–46.0)
Hemoglobin: 10.5 g/dL — ABNORMAL LOW (ref 12.0–15.0)
Immature Granulocytes: 1 %
Lymphocytes Relative: 30 %
Lymphs Abs: 2.9 10*3/uL (ref 0.7–4.0)
MCH: 22.6 pg — ABNORMAL LOW (ref 26.0–34.0)
MCHC: 28.8 g/dL — ABNORMAL LOW (ref 30.0–36.0)
MCV: 78.7 fL — ABNORMAL LOW (ref 80.0–100.0)
Monocytes Absolute: 0.6 10*3/uL (ref 0.1–1.0)
Monocytes Relative: 7 %
Neutro Abs: 5.7 10*3/uL (ref 1.7–7.7)
Neutrophils Relative %: 58 %
Platelets: 244 10*3/uL (ref 150–400)
RBC: 4.64 MIL/uL (ref 3.87–5.11)
RDW: 18.4 % — ABNORMAL HIGH (ref 11.5–15.5)
WBC: 9.6 10*3/uL (ref 4.0–10.5)
nRBC: 0 % (ref 0.0–0.2)

## 2018-08-21 LAB — URINE CULTURE
Culture: 40000 — AB
Special Requests: NORMAL

## 2018-08-21 MED ORDER — LEVOFLOXACIN 500 MG PO TABS: 500.0000 mg | ORAL_TABLET | Freq: Every day | ORAL | Status: DC

## 2018-08-21 MED ORDER — LEVOFLOXACIN 500 MG PO TABS: 500.0000 mg | ORAL_TABLET | Freq: Every day | ORAL | 0 refills | Status: AC

## 2018-08-21 MED ORDER — PNEUMOCOCCAL VAC POLYVALENT 25 MCG/0.5ML IJ INJ
0.5000 mL | INJECTION | INTRAMUSCULAR | Status: DC
  Filled 2018-08-21: qty 0.5

## 2018-08-21 NOTE — Progress Notes (Signed)
Order to DC cardiac monitoring today @1617 .   Nurse notified of 5 bt run of PVC's @ 2111. On-call MD Blount notified by Colin Broach. No response from MD.   Paged on-call MD Blount to ask if I should still DC cardiac monitoring due to PVC's @ around midnight. No response from MD. Will DC cardiac monitoring.   Trinda Pascal RN

## 2018-08-21 NOTE — Discharge Summary (Signed)
Physician Discharge Summary  Heidi BarmanLisa M Green ZOX:[id number] DOB: 11/12/1955 DOA: 08/19/2018  PCP: Heidi Green, Pardeep, MD  Admit date: 08/19/2018 Discharge date: 08/21/2018  Admitted From: Home  Disposition:  Home   Recommendations for Outpatient Follow-up and new medication changes:  1. Follow up with Heidi Green in 7 days.  2. Patient will continue taking levofloxacin for antibiotic therapy for 5 more days. (allergic to penicillin). 3. Ordered pneumococcal vaccine 23 valent, due to recurrent pneumonia.    Home Health: no   Equipment/Devices: no    Discharge Condition: stable  CODE STATUS:full  Diet recommendation: heart healthy   Brief/Interim Summary: 63 yo female who presented with fever, cough and shaking. She has the past medical history of hemorrhagic CVA with right sided deficit.  Reported a fairly acute onset of shortness of breath and cough which became worsening.  On her initial physical examination her blood pressure was 162/90, heart rate 116, respiratory rate 28-31, temperature 39.6 C, oxygen saturation 97% on supplemental oxygen.  Her lungs had bilateral rhonchi, heart S1-S2 present rhythmic, the abdomen soft nontender, no lower extremity edema, she had a wound on the left leg with dressing in place.  Sodium 141, potassium 3.0, chloride 106, bicarb 24, glucose 100, BUN 16, creatinine 0.77, white count 14.3, hemoglobin 11.2, hematocrit 37.6, platelets 273.  SARS COVID-19 negative, influenza A/B, respiratory viral panel negative.  Urine analysis negative for infection.  Her chest film had right rotation, hypoinflation, alveolar infiltrate at the right middle lobe and left lower lobe, positive scoliosis.  EKG 112 bpm, normal axis, sinus rhythm with a right bundle branch block, no significant ST segment or T wave changes.   Patient was admitted to the hospital with working diagnosis of acute hypoxic respiratory failure due to community acquired pneumonia, right middle lobe and left lower  lobe.  1.  Acute hypoxic respiratory failure due to community-acquired pneumonia, suspected aspiration, right middle lobe, left lower lobe, complicated by sepsis, present on admission.  Patient was admitted to the medical ward she was placed on a remote telemetry monitor, received IV fluids and antibiotic therapy with IV levofloxacin.  Patient is allergic to penicillin.  Patient responded well to medical therapy, her cultures have been no growth, she has remained afebrile, her discharge white cell count is 9.6.  She declined a swallow evaluation.  Patient will receive pneumococcal vaccine before discharge.  2.  History of hemorrhagic CVA.  Patient had right-sided weakness, uses motorized scooter at home.  She did decline a swallow evaluation.  Denied any difficulty swallowing.  3.  Chronic back pain.  Continue as needed analgesics.  4.  Right leg wound.  No signs of local infection, continue local wound care.  5.  Hypertension.  Her antihypertensive agents were held during her hospitalization, at discharge she will resume lisinopril.  6.  Hypokalemia.  Potassium was corrected with potassium chloride, her kidney function remained normal.  Her discharge potassium is 3.5.  7.  Obesity.  Calculated BMI 35 point.   Discharge Diagnoses:  Active Problems:   Hypokalemia   Community acquired bilateral lower lobe pneumonia (HCC)   Sepsis due to pneumonia (HCC)   Hypotension   Chronic low back pain    Discharge Instructions   Allergies as of 08/21/2018      Reactions   Garamycin [gentamicin Sulfate] Hives, Shortness Of Breath   Other Anaphylaxis   HORSE SERUM   Penicillins Anaphylaxis, Itching, Swelling   Has patient had a PCN reaction causing immediate rash, facial/tongue/throat  swelling, SOB or lightheadedness with hypotension: Yes Has patient had a PCN reaction causing severe rash involving mucus membranes or skin necrosis: No Has patient had a PCN reaction that required hospitalization  No Has patient had a PCN reaction occurring within the last 10 years: No If all of the above answers are "NO", then may proceed with Cephalosporin use. Reports taking amoxicillin after PCN rxn   Norvasc [amlodipine Besylate] Nausea And Vomiting   Buprenorphine Itching      Medication List    STOP taking these medications   acetaminophen 325 MG tablet Commonly known as:  TYLENOL   polyethylene glycol 17 g packet Commonly known as:  MIRALAX / GLYCOLAX   senna-docusate 8.6-50 MG tablet Commonly known as:  Senokot-S     TAKE these medications   diclofenac 50 MG EC tablet Commonly known as:  VOLTAREN Take 50 mg by mouth 2 (two) times daily.   esomeprazole 40 MG capsule Commonly known as:  NEXIUM Take 40 mg by mouth daily.   guaifenesin 100 MG/5ML syrup Commonly known as:  ROBITUSSIN Take 200 mg by mouth 3 (three) times daily as needed for cough.   HYDROcodone-acetaminophen 7.5-325 MG tablet Commonly known as:  NORCO Take 1 tablet by mouth every 6 (six) hours as needed (pain).   levofloxacin 500 MG tablet Commonly known as:  LEVAQUIN Take 1 tablet (500 mg total) by mouth daily for 5 days. Start taking on:  Aug 22, 2018 What changed:    medication strength  how much to take   lisinopril 20 MG tablet Commonly known as:  ZESTRIL 20 mg daily.   Lyrica 150 MG capsule Generic drug:  pregabalin Take 150 mg by mouth 2 (two) times daily.   mirabegron ER 25 MG Tb24 tablet Commonly known as:  MYRBETRIQ Take 25 mg by mouth at bedtime.   potassium chloride 10 MEQ tablet Commonly known as:  K-DUR Take 10 mEq by mouth 2 (two) times daily.   rOPINIRole 2 MG tablet Commonly known as:  REQUIP Take 2 mg by mouth 2 (two) times daily.   triamcinolone cream 0.1 % Commonly known as:  KENALOG Apply 1 application topically daily as needed for wound care. Apply to periwound with each dressing change daily   Vitamin D (Ergocalciferol) 1.25 MG (50000 UT) Caps capsule Commonly  known as:  DRISDOL Take 50,000 Units by mouth once a week.       Allergies  Allergen Reactions  . Garamycin [Gentamicin Sulfate] Hives and Shortness Of Breath  . Other Anaphylaxis    HORSE SERUM  . Penicillins Anaphylaxis, Itching and Swelling    Has patient had a PCN reaction causing immediate rash, facial/tongue/throat swelling, SOB or lightheadedness with hypotension: Yes Has patient had a PCN reaction causing severe rash involving mucus membranes or skin necrosis: No Has patient had a PCN reaction that required hospitalization No Has patient had a PCN reaction occurring within the last 10 years: No If all of the above answers are "NO", then may proceed with Cephalosporin use. Reports taking amoxicillin after PCN rxn  . Norvasc [Amlodipine Besylate] Nausea And Vomiting  . Buprenorphine Itching    Consultations:  None    Procedures/Studies: Dg Chest Port 1 View  Result Date: 08/19/2018 CLINICAL DATA:  Fever and hypoxia EXAM: PORTABLE CHEST 1 VIEW COMPARISON:  11/11/2017 FINDINGS: Very low lung volumes with bilateral interstitial and airspace opacity. Vascular pedicle widening accentuated by rotation. No Kerley lines, effusion, or pneumothorax. IMPRESSION: Low volume chest with bilateral  opacity/pneumonia. Electronically Signed   By: Marnee Spring M.D.   On: 08/19/2018 05:14      Procedures:   Subjective: Patient is feeling much better, no nausea or vomiting, no fever or chills. No difficulty swallowing.   Discharge Exam: Vitals:   08/20/18 2213 08/21/18 0500  BP: (!) 149/77 (!) 146/90  Pulse: 78 (!) 54  Resp: 20 20  Temp: 98.6 F (37 C) 98.4 F (36.9 C)  SpO2: 96% 98%   Vitals:   08/20/18 0539 08/20/18 1445 08/20/18 2213 08/21/18 0500  BP: 138/79 (!) 157/87 (!) 149/77 (!) 146/90  Pulse: 75 80 78 (!) 54  Resp: Temp: 98.4 F (36.9 C) 99 F (37.2 C) 98.6 F (37 C) 98.4 F (36.9 C)  TempSrc: Oral Oral Oral Oral  SpO2: 96% 97% 96% 98%  Weight:       Height:        General: Not in pain or dyspnea  Neurology: Awake and alert, non focal/ right sided weakness (chronic)  E ENT: no pallor, no icterus, oral mucosa moist Cardiovascular: No JVD. S1-S2 present, rhythmic, no gallops, rubs, or murmurs. No lower extremity edema. Pulmonary: positive breath sounds bilaterally, adequate air movement, no wheezing or rhonchi, bibasilar rales. Gastrointestinal. Abdomen with no organomegaly, non tender, no rebound or guarding Skin. No rashes Musculoskeletal: no joint deformities   The results of significant diagnostics from this hospitalization (including imaging, microbiology, ancillary and laboratory) are listed below for reference.     Microbiology: Recent Results (from the past 240 hour(s))  Urine culture     Status: Abnormal   Collection Time: 08/19/18  4:02 AM  Result Value Ref Range Status   Specimen Description   Final    URINE, CATHETERIZED Performed at [facility], [address] [place], [place] [address]    Special Requests   Final    Normal Performed at [facility], [address], [place], [place] [address]    Culture 40,000 COLONIES/mL ESCHERICHIA COLI (A)  Final   Report Status 08/21/2018 FINAL  Final   Organism ID, Bacteria ESCHERICHIA COLI (A)  Final      Susceptibility   Escherichia coli - MIC*    AMPICILLIN >=32 RESISTANT Resistant     CEFAZOLIN <=4 SENSITIVE Sensitive     CEFTRIAXONE <=1 SENSITIVE Sensitive     CIPROFLOXACIN >=4 RESISTANT Resistant     GENTAMICIN >=16 RESISTANT Resistant     IMIPENEM <=0.25 SENSITIVE Sensitive     NITROFURANTOIN <=16 SENSITIVE Sensitive     TRIMETH/SULFA >=320 RESISTANT Resistant     AMPICILLIN/SULBACTAM >=32 RESISTANT Resistant     PIP/TAZO <=4 SENSITIVE Sensitive     Extended ESBL NEGATIVE Sensitive     * 40,000 COLONIES/mL ESCHERICHIA COLI  SARS Coronavirus 2 [facility] order, Performed in [facility] hospital lab)     Status: None    Collection Time: 08/19/18  4:08 AM  Result Value Ref Range Status   SARS Coronavirus 2 NEGATIVE NEGATIVE Final    Comment: (NOTE) If result is NEGATIVE SARS-CoV-2 target nucleic acids are NOT DETECTED. The SARS-CoV-2 RNA is generally detectable in upper and lower  respiratory specimens during the acute phase of infection. The lowest  concentration of SARS-CoV-2 viral copies this assay can detect is 250  copies / mL. A negative result does not preclude SARS-CoV-2 infection  and should not be used as the sole basis for treatment or other  patient management decisions.  A  negative result may occur with  improper specimen collection / handling, submission of specimen other  than nasopharyngeal swab, presence of viral mutation(s) within the  areas targeted by this assay, and inadequate number of viral copies  (<250 copies / mL). A negative result must be combined with clinical  observations, patient history, and epidemiological information. If result is POSITIVE SARS-CoV-2 target nucleic acids are DETECTED. The SARS-CoV-2 RNA is generally detectable in upper and lower  respiratory specimens dur ing the acute phase of infection.  Positive  results are indicative of active infection with SARS-CoV-2.  Clinical  correlation with patient history and other diagnostic information is  necessary to determine patient infection status.  Positive results do  not rule out bacterial infection or co-infection with other viruses. If result is PRESUMPTIVE POSTIVE SARS-CoV-2 nucleic acids MAY BE PRESENT.   A presumptive positive result was obtained on the submitted specimen  and confirmed on repeat testing.  While 2019 novel coronavirus  (SARS-CoV-2) nucleic acids may be present in the submitted sample  additional confirmatory testing may be necessary for epidemiological  and / or clinical management purposes  to differentiate between  SARS-CoV-2 and other Sarbecovirus currently known to infect humans.   If clinically indicated additional testing with an alternate test  methodology [phone]) is advised. The SARS-CoV-2 RNA is generally  detectable in upper and lower respiratory sp ecimens during the acute  phase of infection. The expected result is Negative. Fact Sheet for Patients:  BoilerBrush.com.cy Fact Sheet for Healthcare Providers: https://pope.com/ This test is not yet approved or cleared by the [place] FDA and has been authorized for detection and/or diagnosis of SARS-CoV-2 by FDA under an Emergency Use Authorization (EUA).  This EUA will remain in effect (meaning this test can be used) for the duration of the COVID-19 declaration under Section 564(b)(1) of the Act, 21 U.S.C. section 360bbb-3(b)(1), unless the authorization is terminated or revoked sooner. Performed at [facility], [address] [place], [place] [address]   Blood Culture (routine x 2)     Status: None (Preliminary result)   Collection Time: 08/19/18  5:29 AM  Result Value Ref Range Status   Specimen Description   Final    BLOOD BLOOD RIGHT FOREARM Performed at [facility], [address] [place], [place] [address]    Special Requests   Final    BOTTLES DRAWN AEROBIC AND ANAEROBIC Blood Culture adequate volume Performed at [facility], [address] [place], [place] [address]    Culture   Final    NO GROWTH 2 DAYS Performed at [facility], [address], [place], [place] [address]    Report Status PENDING  Incomplete  Blood Culture (routine x 2)     Status: None (Preliminary result)   Collection Time: 08/19/18  5:29 AM  Result Value Ref Range Status   Specimen Description   Final    BLOOD LEFT ARM Performed at [facility], [address], [place], [place] [address]    Special Requests   Final    BOTTLES DRAWN AEROBIC AND ANAEROBIC Blood Culture adequate  volume Performed at [facility], [address] [place], [place] [address]    Culture   Final    NO GROWTH 2 DAYS Performed at [facility], [address], [place], [place] [address]    Report Status PENDING  Incomplete  Respiratory Panel by PCR     Status:  None   Collection Time: 08/19/18  9:20 AM  Result Value Ref Range Status   Adenovirus NOT DETECTED NOT DETECTED Final   Coronavirus 229E NOT DETECTED NOT DETECTED Final    Comment: (NOTE) The Coronavirus on the Respiratory Panel, DOES NOT test for the novel  Coronavirus (2019 nCoV)    Coronavirus HKU1 NOT DETECTED NOT DETECTED Final   Coronavirus NL63 NOT DETECTED NOT DETECTED Final   Coronavirus OC43 NOT DETECTED NOT DETECTED Final   Metapneumovirus NOT DETECTED NOT DETECTED Final   Rhinovirus / Enterovirus NOT DETECTED NOT DETECTED Final   Influenza A NOT DETECTED NOT DETECTED Final   Influenza B NOT DETECTED NOT DETECTED Final   Parainfluenza Virus 1 NOT DETECTED NOT DETECTED Final   Parainfluenza Virus 2 NOT DETECTED NOT DETECTED Final   Parainfluenza Virus 3 NOT DETECTED NOT DETECTED Final   Parainfluenza Virus 4 NOT DETECTED NOT DETECTED Final   Respiratory Syncytial Virus NOT DETECTED NOT DETECTED Final   Bordetella pertussis NOT DETECTED NOT DETECTED Final   Chlamydophila pneumoniae NOT DETECTED NOT DETECTED Final   Mycoplasma pneumoniae NOT DETECTED NOT DETECTED Final    Comment: Performed at [facility], [address] [place], [place] [address]  MRSA PCR Screening     Status: None   Collection Time: 08/19/18  9:20 AM  Result Value Ref Range Status   MRSA by PCR NEGATIVE NEGATIVE Final    Comment:        The GeneXpert MRSA Assay (FDA approved for NASAL specimens only), is one component of a comprehensive MRSA colonization surveillance program. It is not intended to diagnose MRSA infection nor to guide or monitor treatment for MRSA infections. Performed at [facility], [address], [place], [place] [address]      Labs: BNP (last 3 results) No results for input(s): BNP in the last 8760 hours. Basic Metabolic Panel: Recent Labs  Lab 08/19/18 0521 08/20/18 0440  NA 141 140  K 3.0* 3.5  CL 106 112*  CO2 24 22  GLUCOSE 100* 100*  BUN 16 15  CREATININE 0.77 0.65  CALCIUM 9.0 8.0*   Liver Function Tests: Recent Labs  Lab 08/19/18 0521 08/20/18 0440  AST 29 20  ALT 22 16  ALKPHOS 93 63  BILITOT 0.6 0.6  PROT 8.0 6.2*  ALBUMIN 4.2 3.1*   No results for input(s): LIPASE, AMYLASE in the last 168 hours. No results for input(s): AMMONIA in the last 168 hours. CBC: Recent Labs  Lab 08/19/18 0521 08/20/18 0440 08/21/18 0838  WBC 14.3* 18.0* 9.6  NEUTROABS 12.6*  --  5.7  HGB 11.2* 9.0* 10.5*  HCT 37.6 29.9* 36.5  MCV 76.0* 77.3* 78.7*  PLT 273 227 244   Cardiac Enzymes: No results for input(s): CKTOTAL, CKMB, CKMBINDEX, TROPONINI in the last 168 hours. BNP: Invalid input(s): POCBNP CBG: No results for input(s): GLUCAP in the last 168 hours. D-Dimer No results for input(s): DDIMER in the last 72 hours. Hgb A1c No results for input(s): HGBA1C in the last 72 hours. Lipid Profile No results for input(s): CHOL, HDL, LDLCALC, TRIG, CHOLHDL, LDLDIRECT in the last 72 hours. Thyroid function studies No results for input(s): TSH, T4TOTAL, T3FREE, THYROIDAB in the last 72 hours.  Invalid input(s): FREET3 Anemia work up No results for input(s): VITAMINB12, FOLATE, FERRITIN, TIBC, IRON, RETICCTPCT in the last 72 hours. Urinalysis    Component Value Date/Time   COLORURINE STRAW (A) 08/19/2018 0402   APPEARANCEUR CLEAR 08/19/2018  0402   LABSPEC 1.012 08/19/2018 0402   PHURINE 6.0 08/19/2018 0402   GLUCOSEU NEGATIVE 08/19/2018 0402   HGBUR NEGATIVE 08/19/2018 0402   BILIRUBINUR NEGATIVE 08/19/2018 0402   KETONESUR NEGATIVE 08/19/2018 0402   PROTEINUR NEGATIVE 08/19/2018 0402   UROBILINOGEN 1.0 02/28/2008 1445    NITRITE NEGATIVE 08/19/2018 0402   LEUKOCYTESUR NEGATIVE 08/19/2018 0402   Sepsis Labs Invalid input(s): PROCALCITONIN,  WBC,  LACTICIDVEN Microbiology Recent Results (from the past 240 hour(s))  Urine culture     Status: Abnormal   Collection Time: 08/19/18  4:02 AM  Result Value Ref Range Status   Specimen Description   Final    URINE, CATHETERIZED Performed at [facility], [address], [place], [place] [address]    Special Requests   Final    Normal Performed at [facility], [address] [place], [place] [address]    Culture 40,000 COLONIES/mL ESCHERICHIA COLI (A)  Final   Report Status 08/21/2018 FINAL  Final   Organism ID, Bacteria ESCHERICHIA COLI (A)  Final      Susceptibility   Escherichia coli - MIC*    AMPICILLIN >=32 RESISTANT Resistant     CEFAZOLIN <=4 SENSITIVE Sensitive     CEFTRIAXONE <=1 SENSITIVE Sensitive     CIPROFLOXACIN >=4 RESISTANT Resistant     GENTAMICIN >=16 RESISTANT Resistant     IMIPENEM <=0.25 SENSITIVE Sensitive     NITROFURANTOIN <=16 SENSITIVE Sensitive     TRIMETH/SULFA >=320 RESISTANT Resistant     AMPICILLIN/SULBACTAM >=32 RESISTANT Resistant     PIP/TAZO <=4 SENSITIVE Sensitive     Extended ESBL NEGATIVE Sensitive     * 40,000 COLONIES/mL ESCHERICHIA COLI  SARS Coronavirus 2 [facility] order, Performed in [facility] hospital lab)     Status: None   Collection Time: 08/19/18  4:08 AM  Result Value Ref Range Status   SARS Coronavirus 2 NEGATIVE NEGATIVE Final    Comment: (NOTE) If result is NEGATIVE SARS-CoV-2 target nucleic acids are NOT DETECTED. The SARS-CoV-2 RNA is generally detectable in upper and lower  respiratory specimens during the acute phase of infection. The lowest  concentration of SARS-CoV-2 viral copies this assay can detect is 250  copies / mL. A negative result does not preclude SARS-CoV-2 infection  and should not be used as the sole basis for treatment or other   patient management decisions.  A negative result may occur with  improper specimen collection / handling, submission of specimen other  than nasopharyngeal swab, presence of viral mutation(s) within the  areas targeted by this assay, and inadequate number of viral copies  (<250 copies / mL). A negative result must be combined with clinical  observations, patient history, and epidemiological information. If result is POSITIVE SARS-CoV-2 target nucleic acids are DETECTED. The SARS-CoV-2 RNA is generally detectable in upper and lower  respiratory specimens dur ing the acute phase of infection.  Positive  results are indicative of active infection with SARS-CoV-2.  Clinical  correlation with patient history and other diagnostic information is  necessary to determine patient infection status.  Positive results do  not rule out bacterial infection or co-infection with other viruses. If result is PRESUMPTIVE POSTIVE SARS-CoV-2 nucleic acids MAY BE PRESENT.   A presumptive positive result was obtained on the submitted specimen  and confirmed on repeat testing.  While 2019 novel coronavirus  (SARS-CoV-2) nucleic acids may be present in the submitted sample  additional confirmatory testing may be necessary for epidemiological  and / or clinical management purposes  to differentiate between  SARS-CoV-2 and other Sarbecovirus currently known to infect humans.  If clinically indicated additional testing with an alternate test  methodology [phone]) is advised. The SARS-CoV-2 RNA is generally  detectable in upper and lower respiratory sp ecimens during the acute  phase of infection. The expected result is Negative. Fact Sheet for Patients:  BoilerBrush.com.cy Fact Sheet for Healthcare Providers: https://pope.com/ This test is not yet approved or cleared by the [place] FDA and has been authorized for detection and/or diagnosis of SARS-CoV-2  by FDA under an Emergency Use Authorization (EUA).  This EUA will remain in effect (meaning this test can be used) for the duration of the COVID-19 declaration under Section 564(b)(1) of the Act, 21 U.S.C. section 360bbb-3(b)(1), unless the authorization is terminated or revoked sooner. Performed at [facility], [address], [place], [place] [address]   Blood Culture (routine x 2)     Status: None (Preliminary result)   Collection Time: 08/19/18  5:29 AM  Result Value Ref Range Status   Specimen Description   Final    BLOOD BLOOD RIGHT FOREARM Performed at [facility], [address], [place], [place] [address]    Special Requests   Final    BOTTLES DRAWN AEROBIC AND ANAEROBIC Blood Culture adequate volume Performed at [facility], [address] [place], [place] [address]    Culture   Final    NO GROWTH 2 DAYS Performed at [facility], [address] [place], [place] [address]    Report Status PENDING  Incomplete  Blood Culture (routine x 2)     Status: None (Preliminary result)   Collection Time: 08/19/18  5:29 AM  Result Value Ref Range Status   Specimen Description   Final    BLOOD LEFT ARM Performed at [facility], [address] [place], [place] [address]    Special Requests   Final    BOTTLES DRAWN AEROBIC AND ANAEROBIC Blood Culture adequate volume Performed at [facility], [address] [place], [place] [address]    Culture   Final    NO GROWTH 2 DAYS Performed at [facility], [address] [place], [place] [address]    Report Status PENDING  Incomplete  Respiratory Panel by PCR     Status: None   Collection Time: 08/19/18  9:20 AM  Result Value Ref Range Status   Adenovirus NOT DETECTED NOT DETECTED Final   Coronavirus 229E NOT DETECTED NOT DETECTED Final    Comment: (NOTE) The Coronavirus on the Respiratory Panel, DOES NOT test for the  novel  Coronavirus (2019 nCoV)    Coronavirus HKU1 NOT DETECTED NOT DETECTED Final   Coronavirus NL63 NOT DETECTED NOT DETECTED Final   Coronavirus OC43 NOT DETECTED NOT DETECTED Final   Metapneumovirus NOT DETECTED NOT DETECTED Final   Rhinovirus / Enterovirus NOT DETECTED NOT DETECTED Final   Influenza A NOT DETECTED NOT DETECTED Final   Influenza B NOT DETECTED NOT DETECTED Final   Parainfluenza Virus 1 NOT DETECTED NOT DETECTED Final   Parainfluenza Virus 2 NOT DETECTED NOT DETECTED Final   Parainfluenza Virus 3 NOT DETECTED NOT DETECTED Final   Parainfluenza Virus 4 NOT DETECTED NOT DETECTED Final   Respiratory Syncytial Virus NOT DETECTED NOT DETECTED Final   Bordetella pertussis NOT DETECTED NOT DETECTED Final   Chlamydophila pneumoniae NOT DETECTED NOT DETECTED Final  Mycoplasma pneumoniae NOT DETECTED NOT DETECTED Final    Comment: Performed at [facility], [address], [place], [place] [address]  MRSA PCR Screening     Status: None   Collection Time: 08/19/18  9:20 AM  Result Value Ref Range Status   MRSA by PCR NEGATIVE NEGATIVE Final    Comment:        The GeneXpert MRSA Assay (FDA approved for NASAL specimens only), is one component of a comprehensive MRSA colonization surveillance program. It is not intended to diagnose MRSA infection nor to guide or monitor treatment for MRSA infections. Performed at [facility], [address] [place], [place] [address]      Time coordinating discharge: 45 minutes  SIGNED:   Coralie Keens, MD  [facility] 08/21/2018, 10:48 AM

## 2018-08-24 LAB — CULTURE, BLOOD (ROUTINE X 2)
Culture: NO GROWTH
Culture: NO GROWTH
Special Requests: ADEQUATE
Special Requests: ADEQUATE

## 2019-05-31 ENCOUNTER — Other Ambulatory Visit: Payer: Self-pay

## 2019-05-31 ENCOUNTER — Emergency Department (HOSPITAL_COMMUNITY)
Admission: EM | Admit: 2019-05-31 | Discharge: 2019-05-31 | Discharge status: Against Medical Advice | Payer: Medicare Other | Attending: Emergency Medicine | Admitting: Emergency Medicine

## 2019-05-31 ENCOUNTER — Emergency Department (HOSPITAL_COMMUNITY): Payer: Medicare Other

## 2019-05-31 ENCOUNTER — Encounter (HOSPITAL_COMMUNITY): Payer: Self-pay

## 2019-05-31 DIAGNOSIS — R509 Fever, unspecified: Secondary | ICD-10-CM | POA: Diagnosis present

## 2019-05-31 DIAGNOSIS — J189 Pneumonia, unspecified organism: Secondary | ICD-10-CM | POA: Diagnosis not present

## 2019-05-31 DIAGNOSIS — I10 Essential (primary) hypertension: Secondary | ICD-10-CM | POA: Diagnosis not present

## 2019-05-31 DIAGNOSIS — Z20822 Contact with and (suspected) exposure to covid-19: Secondary | ICD-10-CM | POA: Insufficient documentation

## 2019-05-31 DIAGNOSIS — Z87891 Personal history of nicotine dependence: Secondary | ICD-10-CM | POA: Diagnosis not present

## 2019-05-31 DIAGNOSIS — Z79899 Other long term (current) drug therapy: Secondary | ICD-10-CM | POA: Insufficient documentation

## 2019-05-31 LAB — CBC WITH DIFFERENTIAL/PLATELET
Abs Immature Granulocytes: 0 10*3/uL (ref 0.00–0.07)
Basophils Absolute: 0 10*3/uL (ref 0.0–0.1)
Basophils Relative: 0 %
Eosinophils Absolute: 0 10*3/uL (ref 0.0–0.5)
Eosinophils Relative: 0 %
HCT: 46.2 % — ABNORMAL HIGH (ref 36.0–46.0)
Hemoglobin: 14.1 g/dL (ref 12.0–15.0)
Lymphocytes Relative: 7 %
Lymphs Abs: 1 10*3/uL (ref 0.7–4.0)
MCH: 25.6 pg — ABNORMAL LOW (ref 26.0–34.0)
MCHC: 30.5 g/dL (ref 30.0–36.0)
MCV: 83.8 fL (ref 80.0–100.0)
Monocytes Absolute: 0.7 10*3/uL (ref 0.1–1.0)
Monocytes Relative: 5 %
Neutro Abs: 12.8 10*3/uL — ABNORMAL HIGH (ref 1.7–7.7)
Neutrophils Relative %: 88 %
Platelets: 191 10*3/uL (ref 150–400)
RBC: 5.51 MIL/uL — ABNORMAL HIGH (ref 3.87–5.11)
RDW: 23.6 % — ABNORMAL HIGH (ref 11.5–15.5)
WBC: 14.6 10*3/uL — ABNORMAL HIGH (ref 4.0–10.5)
nRBC: 0 % (ref 0.0–0.2)
nRBC: 0 /100 WBC

## 2019-05-31 LAB — URINALYSIS, ROUTINE W REFLEX MICROSCOPIC
Bilirubin Urine: NEGATIVE
Glucose, UA: NEGATIVE mg/dL
Hgb urine dipstick: NEGATIVE
Ketones, ur: NEGATIVE mg/dL
Leukocytes,Ua: NEGATIVE
Nitrite: NEGATIVE
Protein, ur: NEGATIVE mg/dL
Specific Gravity, Urine: 1.018 (ref 1.005–1.030)
pH: 5 (ref 5.0–8.0)

## 2019-05-31 LAB — POC SARS CORONAVIRUS 2 AG -  ED: SARS Coronavirus 2 Ag: NEGATIVE

## 2019-05-31 LAB — COMPREHENSIVE METABOLIC PANEL
ALT: 21 U/L (ref 0–44)
AST: 29 U/L (ref 15–41)
Albumin: 4 g/dL (ref 3.5–5.0)
Alkaline Phosphatase: 65 U/L (ref 38–126)
Anion gap: 14 (ref 5–15)
BUN: 18 mg/dL (ref 8–23)
CO2: 19 mmol/L — ABNORMAL LOW (ref 22–32)
Calcium: 9.2 mg/dL (ref 8.9–10.3)
Chloride: 111 mmol/L (ref 98–111)
Creatinine, Ser: 0.96 mg/dL (ref 0.44–1.00)
GFR calc Af Amer: >60 mL/min (ref >60)
GFR calc non Af Amer: >60 mL/min (ref >60)
Glucose, Bld: 99 mg/dL (ref 70–99)
Potassium: 3.7 mmol/L (ref 3.5–5.1)
Sodium: 144 mmol/L (ref 135–145)
Total Bilirubin: 1 mg/dL (ref 0.3–1.2)
Total Protein: 6.9 g/dL (ref 6.5–8.1)

## 2019-05-31 LAB — RESPIRATORY PANEL BY RT PCR (FLU A&B, COVID)
Influenza A by PCR: NEGATIVE
Influenza B by PCR: NEGATIVE
SARS Coronavirus 2 by RT PCR: NEGATIVE

## 2019-05-31 LAB — LACTIC ACID, PLASMA
Lactic Acid, Venous: 2.5 mmol/L (ref 0.5–1.9)
Lactic Acid, Venous: 2.3 mmol/L (ref 0.5–1.9)

## 2019-05-31 MED ORDER — LEVOFLOXACIN 500 MG PO TABS: 500.0000 mg | ORAL_TABLET | Freq: Every day | ORAL | 0 refills | Status: AC

## 2019-05-31 MED ORDER — SODIUM CHLORIDE 0.9 % IV BOLUS
1000.0000 mL | Freq: Once | INTRAVENOUS | Status: AC
  Administered 2019-05-31: 1000 mL via INTRAVENOUS

## 2019-05-31 MED ORDER — ACETAMINOPHEN 500 MG PO TABS
1000.0000 mg | ORAL_TABLET | Freq: Once | ORAL | Status: AC
  Administered 2019-05-31: 1000 mg via ORAL
  Filled 2019-05-31: qty 2

## 2019-05-31 MED ORDER — LEVOFLOXACIN 500 MG PO TABS: 500.0000 mg | ORAL_TABLET | Freq: Every day | ORAL | 0 refills | Status: DC

## 2019-05-31 MED ORDER — LEVOFLOXACIN IN D5W 750 MG/150ML IV SOLN
750.0000 mg | Freq: Once | INTRAVENOUS | Status: AC
  Administered 2019-05-31: 750 mg via INTRAVENOUS
  Filled 2019-05-31: qty 150

## 2019-05-31 NOTE — ED Notes (Signed)
Pt assisted with phone to call her husband

## 2019-05-31 NOTE — ED Provider Notes (Signed)
[facility] EMERGENCY DEPARTMENT Provider Note   CSN: [id number] Arrival date & time:        History Chief Complaint  Patient presents with  . Fever    Heidi Green is a 64 y.o. female.  Presents to ER with chief complaint fever, shortness of breath.  History obtained from patient and husband.  Yesterday in normal state of health, no medical complaints.  This morning patient having generalized illness, hurting all over, no specific pain, no nausea, no vomiting.  Having mild cough, difficulty breathing.  Has not taken any medications this morning.  Recently completed antibiotics for UTI as outpatient (Bactrim).  Has chronic left lower leg wound, no recent changes, no redness.   PMH HTN, hemorrhagic CVA  HPI     Past Medical History:  Diagnosis Date  . Arthritis    in back  . Back pain   . Chest pain 06/16/2012   negative cath with normal LV function  . Headache(784.0)   . Hypertension   . 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 Kidney stones   . Sleep apnea   . Stroke [facility])     Patient Active Problem List   Diagnosis Date Noted  . Sepsis due to pneumonia (HCC) 08/19/2018  . Hypotension 08/19/2018  . Chronic low back pain 08/19/2018  . HCAP (healthcare-associated pneumonia) 11/11/2017  . Acute lower UTI 11/11/2017  . AKI (acute kidney injury) (HCC) 11/11/2017  . Lactic acid acidosis 11/11/2017  . Severe sepsis (HCC) 11/11/2017  . Community acquired bilateral lower lobe pneumonia 10/04/2017  . Hypokalemia 08/01/2016  . Class 2 obesity   . HTN (hypertension) 06/16/2012  . Dyslipidemia 06/16/2012  . Back pain 06/16/2012    Past Surgical History:  Procedure Laterality Date  . ABDOMINAL HYSTERECTOMY    . BACK SURGERY     x 3  . Left ACL repair  2004  . LEFT HEART CATHETERIZATION WITH CORONARY ANGIOGRAM N/A 06/16/2012   Procedure: LEFT HEART CATHETERIZATION WITH CORONARY ANGIOGRAM;  Surgeon: Peter M  [place], MD;  Location: [facility] CATH LAB;  Service: Cardiovascular;  Laterality: N/A;  . lipoma excision from neck  2006  . LITHOTRIPSY    . TUBAL LIGATION       OB History   No obstetric history on file.     Family History  Problem Relation Age of Onset  . Emphysema Mother   . Diabetes Mother     Social History   Tobacco Use  . Smoking status: Former Smoker    Packs/day: 0.50    Years: 26.00    Pack years: 13.00    Types: Cigarettes    Quit date: 06/17/2012    Years since quitting: 6.9  . Smokeless tobacco: Never Used  Substance Use Topics  . Alcohol use: No  . Drug use: No    Home Medications Prior to Admission medications   Medication Sig Start Date End Date Taking? Authorizing Provider  diclofenac (VOLTAREN) 50 MG EC tablet Take 50 mg by mouth 2 (two) times daily.  10/11/16   [provider]  esomeprazole (NEXIUM) 40 MG capsule Take 40 mg by mouth daily. 08/18/18   [provider]  guaifenesin (ROBITUSSIN) 100 MG/5ML syrup Take 200 mg by mouth 3 (three) times daily as needed for cough.    [provider]  HYDROcodone-acetaminophen (NORCO) 7.5-325 MG tablet Take 1 tablet by mouth every 6 (six) hours as needed (pain).  [provider]  lisinopril (PRINIVIL,ZESTRIL) 20 MG tablet 20 mg daily.  01/13/17   [provider]  LYRICA 150 MG capsule Take 150 mg by mouth 2 (two) times daily. 09/06/17   [provider]  mirabegron ER (MYRBETRIQ) 25 MG TB24 tablet Take 25 mg by mouth at bedtime.    [provider]  potassium chloride (K-DUR) 10 MEQ tablet Take 10 mEq by mouth 2 (two) times daily. 05/29/18   [provider]  rOPINIRole (REQUIP) 2 MG tablet Take 2 mg by mouth 2 (two) times daily.     [provider]  triamcinolone cream (KENALOG) 0.1 % Apply 1 application topically daily as needed for wound care. Apply to periwound with each dressing change daily 08/12/18   [provider]  Vitamin D,  Ergocalciferol, (DRISDOL) 50000 units CAPS capsule Take 50,000 Units by mouth once a week. 10/31/17   [provider]    Allergies    Garamycin [gentamicin sulfate], Other, Penicillins, Norvasc [amlodipine besylate], and Buprenorphine  Review of Systems   Review of Systems  Constitutional: Positive for chills, fatigue and fever.  HENT: Negative for ear pain and sore throat.   Eyes: Negative for pain and visual disturbance.  Respiratory: Positive for cough and shortness of breath.   Cardiovascular: Negative for chest pain and palpitations.  Gastrointestinal: Negative for abdominal pain and vomiting.  Genitourinary: Negative for dysuria and hematuria.  Musculoskeletal: Negative for arthralgias and back pain.  Skin: Negative for color change and rash.  Neurological: Negative for seizures and syncope.  All other systems reviewed and are negative.   Physical Exam Updated Vital Signs BP 126/66 (BP Location: Left Arm)   Pulse (!) 127   Temp (!) 102.9 F (39.4 C) (Oral)   Resp (!) 22   Ht 5\' 5"  (1.651 m)   Wt 105 kg   SpO2 92%   BMI 38.52 kg/m   Physical Exam Constitutional:      Appearance: She is obese.  HENT:     Head: Normocephalic and atraumatic.     Nose: Nose normal.     Mouth/Throat:     Mouth: Mucous membranes are moist.     Pharynx: Oropharynx is clear.  Eyes:     Pupils: Pupils are equal, round, and reactive to light.  Cardiovascular:     Rate and Rhythm: Tachycardia present.     Pulses: Normal pulses.     Heart sounds: Normal heart sounds.  Pulmonary:     Comments: Some tachypnea, mild increased work of breathing, speaking in full sentences, coarse breath sounds bilaterally Abdominal:     General: Abdomen is flat. Bowel sounds are normal.  Musculoskeletal:        General: No tenderness, deformity or signs of injury.     Cervical back: Normal range of motion and neck supple. No rigidity.     Comments: LLE: 2cm lower leg chronic wound healing well, no  purulence, no erythema, no fluctuance  Skin:    General: Skin is warm and dry.     Capillary Refill: Capillary refill takes less than 2 seconds.  Neurological:     General: No focal deficit present.     Mental Status: She is alert and oriented to person, place, and time.  Psychiatric:        Mood and Affect: Mood normal.        Behavior: Behavior normal.     ED Results / Procedures / Treatments   Labs (all labs ordered  are listed, but only abnormal results are displayed) Labs Reviewed  CULTURE, BLOOD (ROUTINE X 2)  CULTURE, BLOOD (ROUTINE X 2)  CBC WITH DIFFERENTIAL/PLATELET  COMPREHENSIVE METABOLIC PANEL  URINALYSIS, ROUTINE W REFLEX MICROSCOPIC  LACTIC ACID, PLASMA  LACTIC ACID, PLASMA  POC SARS CORONAVIRUS 2 AG -  ED    EKG None  Radiology No results found.  Procedures Procedures (including critical care time)  Medications Ordered in ED Medications  acetaminophen (TYLENOL) tablet 1,000 mg (has no administration in time range)    ED Course  I have reviewed the triage vital signs and the nursing notes.  Pertinent labs & imaging results that were available during my care of the patient were reviewed by me and considered in my medical decision making (see chart for details).  Clinical Course as of May 30 1536  Mon May 31, 2019  0730 At bedside soon after arrival, febrile, somewhat tachypnic but not in distress; high clinical suspicion for covid, will give check broad infectious work up   [RD]  Wallace D/w Annie Main, husband, provided update   [RD]    Clinical Course User Index [RD] Lucrezia Starch, MD   MDM Rules/Calculators/A&P                      64 year old lady who presented to ER with complaints of cough, generalized illness.  Here noted to be febrile, tachycardic, tachypneic.  Initially on 6 L nasal cannula but weaned down to room air.  Symptoms improved after antipyretic.  Work-up notable for negative Covid.  CXR with concern for pneumonia.  Suspect her  symptoms are related to community-acquired pneumonia.  Has bad penicillin allergy.  Gave Levaquin.  Given her initial tachypnea, elevation in lactic, leukocytosis, recommended admission for further observation.  Patient adamant to be discharged.  I reviewed my concerns and risks of discharge, patient as well as husband acknowledged these risks including further deterioration and death.  Signed out AMA.  Provided Rx for p.o. Levaquin, stressed importance of close PCP follow-up and return for any worsening.  Final Clinical Impression(s) / ED Diagnoses Final diagnoses:  Community acquired pneumonia, unspecified laterality    Rx / DC Orders ED Discharge Orders    None       Lucrezia Starch, MD 05/31/19 1540

## 2019-05-31 NOTE — Discharge Instructions (Addendum)
Take antibiotic as prescribed. We recommended admission but you chose to go home.  In lieu of this decision, please call your primary doctor to get a close follow-up appointment.  Recommend using your oxygen at home 2 L unless otherwise recommended by your primary doctor.  If you change your mind, you develop any difficulty in breathing, recurrent fevers or other new concerning symptom, return to ER for reassessment.  Recommend Tylenol as needed for fever control.

## 2019-05-31 NOTE — ED Notes (Signed)
[phone] Heidi Green husband wants an update.Marland Kitchen

## 2019-05-31 NOTE — ED Triage Notes (Signed)
Pt from home with ems, initial complaint was SOB, upon ems arrival pt denies SOB but resp 25-30. Pt arrived to ED on 6L [place] with sats at 95%. Pt febrile. Hx of pneumonia last year. Tachycardic 120-130. Hx of CVA with right sided deficits and slurred speech. Pt alert, oriented x3

## 2019-05-31 NOTE — ED Notes (Signed)
Purewick applied and pt brief changed. Pt repositioned.

## 2019-05-31 NOTE — ED Notes (Signed)
Pt removed [place] and mask, RN reapplied both, pt stating she is ready to go home. RN to call husband and give update on POC

## 2019-05-31 NOTE — ED Notes (Signed)
Pts brief saturated with malodorous urine upon arrival.

## 2019-06-05 LAB — CULTURE, BLOOD (ROUTINE X 2)
Culture: NO GROWTH
Culture: NO GROWTH
Special Requests: ADEQUATE
Special Requests: ADEQUATE

## 2019-06-10 ENCOUNTER — Encounter (HOSPITAL_COMMUNITY): Payer: Self-pay

## 2019-06-30 ENCOUNTER — Other Ambulatory Visit: Payer: Self-pay

## 2019-06-30 ENCOUNTER — Other Ambulatory Visit (HOSPITAL_COMMUNITY): Payer: Self-pay | Admitting: Family Medicine

## 2019-06-30 ENCOUNTER — Ambulatory Visit (HOSPITAL_COMMUNITY)
Admission: RE | Admit: 2019-06-30 | Discharge: 2019-06-30 | Disposition: A | Discharge status: Home / Self Care | Payer: Medicare Other | Source: Ambulatory Visit | Attending: Vascular Surgery | Admitting: Vascular Surgery

## 2019-06-30 DIAGNOSIS — L97909 Non-pressure chronic ulcer of unspecified part of unspecified lower leg with unspecified severity: Secondary | ICD-10-CM | POA: Diagnosis not present

## 2019-12-19 ENCOUNTER — Emergency Department (HOSPITAL_COMMUNITY): Payer: Medicare Other

## 2019-12-19 ENCOUNTER — Inpatient Hospital Stay (HOSPITAL_COMMUNITY): Payer: Medicare Other

## 2019-12-19 ENCOUNTER — Other Ambulatory Visit: Payer: Self-pay

## 2019-12-19 ENCOUNTER — Inpatient Hospital Stay (HOSPITAL_COMMUNITY)
Admission: EM | Admit: 2019-12-19 | Discharge: 2019-12-22 | DRG: 871 | Disposition: A | Discharge status: Home Health | Payer: Medicare Other | Attending: Family Medicine | Admitting: Family Medicine

## 2019-12-19 ENCOUNTER — Encounter (HOSPITAL_COMMUNITY): Payer: Self-pay

## 2019-12-19 DIAGNOSIS — I10 Essential (primary) hypertension: Secondary | ICD-10-CM | POA: Diagnosis present

## 2019-12-19 DIAGNOSIS — E785 Hyperlipidemia, unspecified: Secondary | ICD-10-CM | POA: Diagnosis present

## 2019-12-19 DIAGNOSIS — G894 Chronic pain syndrome: Secondary | ICD-10-CM | POA: Diagnosis present

## 2019-12-19 DIAGNOSIS — G4733 Obstructive sleep apnea (adult) (pediatric): Secondary | ICD-10-CM | POA: Diagnosis present

## 2019-12-19 DIAGNOSIS — L89616 Pressure-induced deep tissue damage of right heel: Secondary | ICD-10-CM | POA: Diagnosis present

## 2019-12-19 DIAGNOSIS — M479 Spondylosis, unspecified: Secondary | ICD-10-CM | POA: Diagnosis present

## 2019-12-19 DIAGNOSIS — Z6841 Body Mass Index (BMI) 40.0 and over, adult: Secondary | ICD-10-CM

## 2019-12-19 DIAGNOSIS — Z888 Allergy status to other drugs, medicaments and biological substances status: Secondary | ICD-10-CM

## 2019-12-19 DIAGNOSIS — I69351 Hemiplegia and hemiparesis following cerebral infarction affecting right dominant side: Secondary | ICD-10-CM

## 2019-12-19 DIAGNOSIS — N170 Acute kidney failure with tubular necrosis: Secondary | ICD-10-CM | POA: Diagnosis present

## 2019-12-19 DIAGNOSIS — G2581 Restless legs syndrome: Secondary | ICD-10-CM | POA: Diagnosis present

## 2019-12-19 DIAGNOSIS — J180 Bronchopneumonia, unspecified organism: Secondary | ICD-10-CM | POA: Diagnosis present

## 2019-12-19 DIAGNOSIS — J9601 Acute respiratory failure with hypoxia: Secondary | ICD-10-CM | POA: Diagnosis present

## 2019-12-19 DIAGNOSIS — A419 Sepsis, unspecified organism: Principal | ICD-10-CM | POA: Diagnosis present

## 2019-12-19 DIAGNOSIS — Z87891 Personal history of nicotine dependence: Secondary | ICD-10-CM

## 2019-12-19 DIAGNOSIS — R4182 Altered mental status, unspecified: Secondary | ICD-10-CM | POA: Diagnosis not present

## 2019-12-19 DIAGNOSIS — I69320 Aphasia following cerebral infarction: Secondary | ICD-10-CM

## 2019-12-19 DIAGNOSIS — N179 Acute kidney failure, unspecified: Secondary | ICD-10-CM | POA: Diagnosis not present

## 2019-12-19 DIAGNOSIS — I6930 Unspecified sequelae of cerebral infarction: Secondary | ICD-10-CM

## 2019-12-19 DIAGNOSIS — R6520 Severe sepsis without septic shock: Secondary | ICD-10-CM | POA: Diagnosis present

## 2019-12-19 DIAGNOSIS — L03116 Cellulitis of left lower limb: Secondary | ICD-10-CM | POA: Diagnosis present

## 2019-12-19 DIAGNOSIS — L03115 Cellulitis of right lower limb: Secondary | ICD-10-CM | POA: Diagnosis present

## 2019-12-19 DIAGNOSIS — Z833 Family history of diabetes mellitus: Secondary | ICD-10-CM

## 2019-12-19 DIAGNOSIS — Z825 Family history of asthma and other chronic lower respiratory diseases: Secondary | ICD-10-CM

## 2019-12-19 DIAGNOSIS — R0902 Hypoxemia: Secondary | ICD-10-CM

## 2019-12-19 DIAGNOSIS — L539 Erythematous condition, unspecified: Secondary | ICD-10-CM

## 2019-12-19 DIAGNOSIS — Z881 Allergy status to other antibiotic agents status: Secondary | ICD-10-CM

## 2019-12-19 DIAGNOSIS — Z880 Allergy status to penicillin: Secondary | ICD-10-CM | POA: Diagnosis not present

## 2019-12-19 DIAGNOSIS — Z20822 Contact with and (suspected) exposure to covid-19: Secondary | ICD-10-CM | POA: Diagnosis present

## 2019-12-19 DIAGNOSIS — R059 Cough, unspecified: Secondary | ICD-10-CM

## 2019-12-19 DIAGNOSIS — L0390 Cellulitis, unspecified: Secondary | ICD-10-CM | POA: Diagnosis present

## 2019-12-19 LAB — BLOOD GAS, ARTERIAL
Acid-base deficit: 0.2 mmol/L (ref 0.0–2.0)
Bicarbonate: 23.1 mmol/L (ref 20.0–28.0)
FIO2: 36
O2 Saturation: 94.4 %
Patient temperature: 99.8
pCO2 arterial: 36.2 mmHg (ref 32.0–48.0)
pH, Arterial: 7.424 (ref 7.350–7.450)
pO2, Arterial: 73.7 mmHg — ABNORMAL LOW (ref 83.0–108.0)

## 2019-12-19 LAB — CBC WITH DIFFERENTIAL/PLATELET
Abs Immature Granulocytes: 0.1 10*3/uL — ABNORMAL HIGH (ref 0.00–0.07)
Basophils Absolute: 0.1 10*3/uL (ref 0.0–0.1)
Basophils Relative: 0 %
Eosinophils Absolute: 0.2 10*3/uL (ref 0.0–0.5)
Eosinophils Relative: 1 %
HCT: 44.6 % (ref 36.0–46.0)
Hemoglobin: 14.2 g/dL (ref 12.0–15.0)
Immature Granulocytes: 1 %
Lymphocytes Relative: 11 %
Lymphs Abs: 2.1 10*3/uL (ref 0.7–4.0)
MCH: 30 pg (ref 26.0–34.0)
MCHC: 31.8 g/dL (ref 30.0–36.0)
MCV: 94.1 fL (ref 80.0–100.0)
Monocytes Absolute: 0.9 10*3/uL (ref 0.1–1.0)
Monocytes Relative: 5 %
Neutro Abs: 15.3 10*3/uL — ABNORMAL HIGH (ref 1.7–7.7)
Neutrophils Relative %: 82 %
Platelets: 243 10*3/uL (ref 150–400)
RBC: 4.74 MIL/uL (ref 3.87–5.11)
RDW: 15.2 % (ref 11.5–15.5)
WBC: 18.6 10*3/uL — ABNORMAL HIGH (ref 4.0–10.5)
nRBC: 0 % (ref 0.0–0.2)

## 2019-12-19 LAB — LACTIC ACID, PLASMA: Lactic Acid, Venous: 0.9 mmol/L (ref 0.5–1.9)

## 2019-12-19 LAB — URINALYSIS, ROUTINE W REFLEX MICROSCOPIC
Bilirubin Urine: NEGATIVE
Glucose, UA: NEGATIVE mg/dL
Hgb urine dipstick: NEGATIVE
Ketones, ur: NEGATIVE mg/dL
Leukocytes,Ua: NEGATIVE
Nitrite: NEGATIVE
Protein, ur: NEGATIVE mg/dL
Specific Gravity, Urine: 1.012 (ref 1.005–1.030)
pH: 9 — ABNORMAL HIGH (ref 5.0–8.0)

## 2019-12-19 LAB — LIPASE, BLOOD: Lipase: 25 U/L (ref 11–51)

## 2019-12-19 LAB — COMPREHENSIVE METABOLIC PANEL
ALT: 17 U/L (ref 0–44)
AST: 19 U/L (ref 15–41)
Albumin: 3.9 g/dL (ref 3.5–5.0)
Alkaline Phosphatase: 68 U/L (ref 38–126)
Anion gap: 8 (ref 5–15)
BUN: 24 mg/dL — ABNORMAL HIGH (ref 8–23)
CO2: 26 mmol/L (ref 22–32)
Calcium: 8.9 mg/dL (ref 8.9–10.3)
Chloride: 106 mmol/L (ref 98–111)
Creatinine, Ser: 1.73 mg/dL — ABNORMAL HIGH (ref 0.44–1.00)
GFR calc Af Amer: 35 mL/min — ABNORMAL LOW (ref >60)
GFR calc non Af Amer: 30 mL/min — ABNORMAL LOW (ref >60)
Glucose, Bld: 96 mg/dL (ref 70–99)
Potassium: 4.7 mmol/L (ref 3.5–5.1)
Sodium: 140 mmol/L (ref 135–145)
Total Bilirubin: 0.7 mg/dL (ref 0.3–1.2)
Total Protein: 7.1 g/dL (ref 6.5–8.1)

## 2019-12-19 LAB — BRAIN NATRIURETIC PEPTIDE: B Natriuretic Peptide: 118.3 pg/mL — ABNORMAL HIGH (ref 0.0–100.0)

## 2019-12-19 LAB — PROTIME-INR
INR: 1.2 (ref 0.8–1.2)
Prothrombin Time: 14.6 seconds (ref 11.4–15.2)

## 2019-12-19 LAB — APTT: aPTT: 39 seconds — ABNORMAL HIGH (ref 24–36)

## 2019-12-19 LAB — SARS CORONAVIRUS 2 BY RT PCR (HOSPITAL ORDER, PERFORMED IN ~~LOC~~ HOSPITAL LAB): SARS Coronavirus 2: NEGATIVE

## 2019-12-19 MED ORDER — LACTATED RINGERS IV SOLN: INTRAVENOUS | Status: DC

## 2019-12-19 MED ORDER — ORAL CARE MOUTH RINSE
15.0000 mL | Freq: Two times a day (BID) | OROMUCOSAL | Status: DC
  Administered 2019-12-20 – 2019-12-21 (×3): 15 mL via OROMUCOSAL

## 2019-12-19 MED ORDER — SODIUM CHLORIDE 0.9% FLUSH
3.0000 mL | Freq: Two times a day (BID) | INTRAVENOUS | Status: DC
  Administered 2019-12-19 – 2019-12-21 (×5): 3 mL via INTRAVENOUS

## 2019-12-19 MED ORDER — CHLORHEXIDINE GLUCONATE CLOTH 2 % EX PADS
6.0000 | MEDICATED_PAD | Freq: Every day | CUTANEOUS | Status: DC
  Administered 2019-12-20: 6 via TOPICAL

## 2019-12-19 MED ORDER — VANCOMYCIN HCL IN DEXTROSE 1-5 GM/200ML-% IV SOLN
1000.0000 mg | Freq: Once | INTRAVENOUS | Status: AC
  Administered 2019-12-19: 1000 mg via INTRAVENOUS
  Filled 2019-12-19: qty 200

## 2019-12-19 MED ORDER — LACTATED RINGERS IV BOLUS (SEPSIS)
1000.0000 mL | Freq: Once | INTRAVENOUS | Status: AC
  Administered 2019-12-19: 1000 mL via INTRAVENOUS

## 2019-12-19 MED ORDER — HEPARIN SODIUM (PORCINE) 5000 UNIT/ML IJ SOLN
5000.0000 [IU] | Freq: Three times a day (TID) | INTRAMUSCULAR | Status: DC
  Administered 2019-12-19 – 2019-12-22 (×8): 5000 [IU] via SUBCUTANEOUS
  Filled 2019-12-19 (×8): qty 1

## 2019-12-19 MED ORDER — ONDANSETRON HCL 4 MG/2ML IJ SOLN: 4.0000 mg | Freq: Four times a day (QID) | INTRAMUSCULAR | Status: DC | PRN

## 2019-12-19 MED ORDER — CHLORHEXIDINE GLUCONATE 0.12 % MT SOLN
15.0000 mL | Freq: Two times a day (BID) | OROMUCOSAL | Status: DC
  Administered 2019-12-21 (×2): 15 mL via OROMUCOSAL
  Filled 2019-12-19 (×4): qty 15

## 2019-12-19 MED ORDER — LACTATED RINGERS IV BOLUS (SEPSIS)
800.0000 mL | Freq: Once | INTRAVENOUS | Status: AC
  Administered 2019-12-19: 800 mL via INTRAVENOUS

## 2019-12-19 MED ORDER — ACETAMINOPHEN 650 MG RE SUPP: 650.0000 mg | Freq: Four times a day (QID) | RECTAL | Status: DC | PRN

## 2019-12-19 MED ORDER — ONDANSETRON HCL 4 MG PO TABS: 4.0000 mg | ORAL_TABLET | Freq: Four times a day (QID) | ORAL | Status: DC | PRN

## 2019-12-19 MED ORDER — ACETAMINOPHEN 325 MG PO TABS: 650.0000 mg | ORAL_TABLET | Freq: Four times a day (QID) | ORAL | Status: DC | PRN

## 2019-12-19 MED ORDER — CLINDAMYCIN PHOSPHATE 600 MG/50ML IV SOLN
600.0000 mg | Freq: Three times a day (TID) | INTRAVENOUS | Status: DC
  Administered 2019-12-19 – 2019-12-20 (×2): 600 mg via INTRAVENOUS
  Filled 2019-12-19 (×2): qty 50

## 2019-12-19 MED ORDER — SODIUM CHLORIDE 0.9 % IV SOLN
2.0000 g | Freq: Once | INTRAVENOUS | Status: AC
  Administered 2019-12-19: 2 g via INTRAVENOUS
  Filled 2019-12-19: qty 2

## 2019-12-19 MED ORDER — METRONIDAZOLE IN NACL 5-0.79 MG/ML-% IV SOLN
500.0000 mg | Freq: Once | INTRAVENOUS | Status: AC
  Administered 2019-12-19: 500 mg via INTRAVENOUS
  Filled 2019-12-19: qty 100

## 2019-12-19 NOTE — Progress Notes (Signed)
A consult was received from an ED physician for vancomycin and aztreonam per pharmacy dosing.  The patient's profile has been reviewed for ht/wt/allergies/indication/available labs.   A one time order has been placed for vancomycin 1gm and aztreonam 2gm.    Further antibiotics/pharmacy consults should be ordered by admitting physician if indicated.                       Thank you, Arley Phenix RPh 12/19/2019, 3:38 PM

## 2019-12-19 NOTE — ED Provider Notes (Addendum)
Heidi Green Provider Note   CSN: [id number] Arrival date & time: 12/19/19  1457     History Chief Complaint  Patient presents with  . Fever  . Altered Mental Status    Heidi Green is a 64 y.o. female.  The history is provided by the patient and medical records. No language interpreter was used.  Fever Max temp prior to arrival:  103 Temp source:  Oral Severity:  Severe Onset quality:  Gradual Duration:  2 days Timing:  Constant Progression:  Waxing and waning Chronicity:  New Relieved by:  Nothing Worsened by:  Nothing Ineffective treatments:  None tried Associated symptoms: chills, confusion, cough and rash   Associated symptoms: no chest pain, no congestion, no diarrhea, no dysuria, no headaches, no myalgias, no nausea, no rhinorrhea and no vomiting   Altered Mental Status Presenting symptoms: confusion   Associated symptoms: fever, rash and weakness (at baseline)   Associated symptoms: no abdominal pain, no agitation, no headaches, no light-headedness, no nausea, no palpitations, no seizures and no vomiting        Past Medical History:  Diagnosis Date  . Arthritis    in back  . Back pain   . Chest pain 06/16/2012   negative cath with normal LV function  . Headache(784.0)   . Hypertension   . 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 Kidney stones   . Sleep apnea   . Stroke [facility])     Patient Active Problem List   Diagnosis Date Noted  . Sepsis due to pneumonia (HCC) 08/19/2018  . Hypotension 08/19/2018  . Chronic low back pain 08/19/2018  . HCAP (healthcare-associated pneumonia) 11/11/2017  . Acute lower UTI 11/11/2017  . AKI (acute kidney injury) (HCC) 11/11/2017  . Lactic acid acidosis 11/11/2017  . Severe sepsis (HCC) 11/11/2017  . Community acquired bilateral lower lobe pneumonia 10/04/2017  . Hypokalemia 08/01/2016  . Class 2 obesity   . HTN (hypertension) 06/16/2012  .  Dyslipidemia 06/16/2012  . Back pain 06/16/2012    Past Surgical History:  Procedure Laterality Date  . ABDOMINAL HYSTERECTOMY    . BACK SURGERY     x 3  . Left ACL repair  2004  . LEFT HEART CATHETERIZATION WITH CORONARY ANGIOGRAM N/A 06/16/2012   Procedure: LEFT HEART CATHETERIZATION WITH CORONARY ANGIOGRAM;  Surgeon: Peter M Swaziland, MD;  Location: [facility] CATH LAB;  Service: Cardiovascular;  Laterality: N/A;  . lipoma excision from neck  2006  . LITHOTRIPSY    . TUBAL LIGATION       OB History   No obstetric history on file.     Family History  Problem Relation Age of Onset  . Emphysema Mother   . Diabetes Mother     Social History   Tobacco Use  . Smoking status: Former Smoker    Packs/day: 0.50    Years: 26.00    Pack years: 13.00    Types: Cigarettes    Quit date: 06/17/2012    Years since quitting: 7.5  . Smokeless tobacco: Never Used  Substance Use Topics  . Alcohol use: No  . Drug use: No    Home Medications Prior to Admission medications   Medication Sig Start Date End Date Taking? Authorizing Provider  diclofenac (VOLTAREN) 50 MG EC tablet Take 50 mg by mouth 2 (two) times daily.  10/11/16   [provider]  esomeprazole (NEXIUM) 40 MG capsule Take 40 mg  by mouth daily. 08/18/18   [provider]  guaifenesin (ROBITUSSIN) 100 MG/5ML syrup Take 200 mg by mouth 3 (three) times daily as needed for cough.    [provider]  HYDROcodone-acetaminophen (NORCO) 7.5-325 MG tablet Take 1 tablet by mouth every 6 (six) hours as needed (pain).     [provider]  lisinopril (PRINIVIL,ZESTRIL) 20 MG tablet 20 mg daily.  01/13/17   [provider]  LYRICA 150 MG capsule Take 150 mg by mouth 2 (two) times daily. 09/06/17   [provider]  mirabegron ER (MYRBETRIQ) 25 MG TB24 tablet Take 25 mg by mouth at bedtime.    [provider]  potassium chloride (K-DUR) 10 MEQ tablet Take 10 mEq by mouth 2 (two) times daily.  05/29/18   [provider]  rOPINIRole (REQUIP) 2 MG tablet Take 2 mg by mouth 2 (two) times daily.     [provider]  triamcinolone cream (KENALOG) 0.1 % Apply 1 application topically daily as needed for wound care. Apply to periwound with each dressing change daily 08/12/18   [provider]  Vitamin D, Ergocalciferol, (DRISDOL) 50000 units CAPS capsule Take 50,000 Units by mouth once a week. 10/31/17   [provider]    Allergies    Garamycin [gentamicin sulfate], Other, Penicillins, Norvasc [amlodipine besylate], and Buprenorphine  Review of Systems   Review of Systems  Constitutional: Positive for chills, fatigue and fever. Negative for diaphoresis.  HENT: Negative for congestion and rhinorrhea.   Eyes: Negative for visual disturbance.  Respiratory: Positive for cough and shortness of breath. Negative for chest tightness and wheezing.   Cardiovascular: Negative for chest pain, palpitations and leg swelling.  Gastrointestinal: Negative for abdominal pain, constipation, diarrhea, nausea and vomiting.  Genitourinary: Negative for dysuria, flank pain and frequency.  Musculoskeletal: Negative for back pain, myalgias, neck pain and neck stiffness.  Skin: Positive for rash and wound.  Neurological: Positive for speech difficulty and weakness (at baseline). Negative for seizures, light-headedness, numbness and headaches.  Psychiatric/Behavioral: Positive for confusion. Negative for agitation.  All other systems reviewed and are negative.   Physical Exam Updated Vital Signs BP (!) 135/59 (BP Location: Right Arm)   Pulse (!) 104   Temp 99.8 F (37.7 C) (Oral)   Resp (!) 25   Ht 5\' 6"  (1.676 m)   Wt 99.8 kg   SpO2 97%   BMI 35.51 kg/m   Physical Exam Vitals and nursing note reviewed.  Constitutional:      General: She is not in acute distress.    Appearance: She is well-developed. She is obese. She is ill-appearing and toxic-appearing. She is  not diaphoretic.  HENT:     Head: Normocephalic and atraumatic.     Nose: Nose normal. No congestion or rhinorrhea.     Mouth/Throat:     Mouth: Mucous membranes are dry.     Pharynx: No oropharyngeal exudate or posterior oropharyngeal erythema.  Eyes:     Extraocular Movements: Extraocular movements intact.     Conjunctiva/sclera: Conjunctivae normal.     Pupils: Pupils are equal, round, and reactive to light.  Cardiovascular:     Rate and Rhythm: Regular rhythm. Tachycardia present.     Heart sounds: No murmur heard.   Pulmonary:     Effort: Pulmonary effort is normal. No respiratory distress.     Breath sounds: Rhonchi present. No wheezing or rales.  Chest:     Chest wall: No tenderness.  Abdominal:  General: Abdomen is flat. There is no distension.     Palpations: Abdomen is soft.     Tenderness: There is no abdominal tenderness. There is no right CVA tenderness, left CVA tenderness, guarding or rebound.  Musculoskeletal:        General: Swelling and tenderness present.     Cervical back: Neck supple. No tenderness.     Right lower leg: Edema present.     Left lower leg: Tenderness present. Edema present.     Comments: Bilateral leg erythema left worse than right with some chronic wounds on the left shin area.  Skin:    General: Skin is warm and dry.     Capillary Refill: Capillary refill takes less than 2 seconds.     Findings: Erythema present.  Neurological:     Mental Status: She is alert. Mental status is at baseline.     Cranial Nerves: No dysarthria or facial asymmetry.     Sensory: No sensory deficit.     Motor: Weakness and abnormal muscle tone present. No tremor.     Comments: Mild to moderate aphasia and right-sided weakness in her arm and leg which husband reports is slightly worse than her baseline but patient thinks is at her baseline     ED Results / Procedures / Treatments   Labs (all labs ordered are listed, but only abnormal results are  displayed) Labs Reviewed  COMPREHENSIVE METABOLIC PANEL - Abnormal; Notable for the following components:      Result Value   BUN 24 (*)    Creatinine, Ser 1.73 (*)    GFR calc non Af Amer 30 (*)    GFR calc Af Amer 35 (*)    All other components within normal limits  CBC WITH DIFFERENTIAL/PLATELET - Abnormal; Notable for the following components:   WBC 18.6 (*)    Neutro Abs 15.3 (*)    Abs Immature Granulocytes 0.10 (*)    All other components within normal limits  APTT - Abnormal; Notable for the following components:   aPTT 39 (*)    All other components within normal limits  URINALYSIS, ROUTINE W REFLEX MICROSCOPIC - Abnormal; Notable for the following components:   pH 9.0 (*)    All other components within normal limits  BLOOD GAS, ARTERIAL - Abnormal; Notable for the following components:   pO2, Arterial 73.7 (*)    All other components within normal limits  BRAIN NATRIURETIC PEPTIDE - Abnormal; Notable for the following components:   B Natriuretic Peptide 118.3 (*)    All other components within normal limits  SARS CORONAVIRUS 2 BY RT PCR (HOSPITAL ORDER, PERFORMED IN [place] [facility])  CULTURE, BLOOD (ROUTINE X 2)  CULTURE, BLOOD (ROUTINE X 2)  URINE CULTURE  MRSA PCR SCREENING  LACTIC ACID, PLASMA  PROTIME-INR  LIPASE, BLOOD  HIV ANTIBODY (ROUTINE TESTING W REFLEX)  CORTISOL-AM, BLOOD  PROCALCITONIN  PROCALCITONIN  BASIC METABOLIC PANEL  CBC  SODIUM, URINE, RANDOM  CREATININE, URINE, RANDOM    EKG EKG Interpretation  Date/Time:  Sunday December 19 2019 16:15:10 EDT Ventricular Rate:  102 PR Interval:    QRS Duration: 119 QT Interval:  343 QTC Calculation: 447 R Axis:   46 Text Interpretation: Sinus tachycardia Multiple ventricular premature complexes Borderline prolonged PR interval Incomplete right bundle branch block Low voltage, precordial leads When compared to prior, similar tachycardia with new PVCs. No STEMI Confirmed by Theda Belfast  [phone]) on 12/19/2019 4:42:23 PM   Radiology DG Tibia/Fibula  Left  Result Date: 12/19/2019 CLINICAL DATA:  Left lower leg swelling and infection. EXAM: LEFT TIBIA AND FIBULA - 2 VIEW COMPARISON:  Left knee dated 07/24/2016 FINDINGS: Diffuse soft tissue swelling. No soft tissue gas, bone destruction or periosteal reaction. Previously demonstrated left knee degenerative changes without significant change. Interval small knee effusion. IMPRESSION: 1. Diffuse soft tissue swelling with no soft tissue gas or underlying bone abnormality 2. Interval small left knee effusion. 3. Stable left knee degenerative changes. Electronically Signed   By: Beckie SaltsSteven  Reid M.D.   On: 12/19/2019 17:07   DG Tibia/Fibula Right  Result Date: 12/19/2019 CLINICAL DATA:  Right leg swelling and infection. EXAM: RIGHT TIBIA AND FIBULA - 2 VIEW COMPARISON:  None. FINDINGS: Diffuse soft tissue swelling. No bone destruction, periosteal reaction or soft tissue gas. Normal appearing right knee and ankle. IMPRESSION: Diffuse soft tissue swelling without underlying bony abnormality. Electronically Signed   By: Beckie SaltsSteven  Reid M.D.   On: 12/19/2019 17:08   CT Head Wo Contrast  Result Date: 12/19/2019 CLINICAL DATA:  Altered mental status, sepsis EXAM: CT HEAD WITHOUT CONTRAST TECHNIQUE: Contiguous axial images were obtained from the base of the skull through the vertex without intravenous contrast. COMPARISON:  10/04/2017 FINDINGS: Brain: Normal anatomic configuration. Moderate subcortical and periventricular white matter changes are present, more prevalent within the a frontal subcortical white matter, stable since prior examination and likely the sequela of small vessel ischemia. Tiny remote infarcts are noted within the cerebellar hemispheres bilaterally. No abnormal intra or extra-axial mass lesion or fluid collection. No abnormal mass effect or midline shift. No evidence of acute intracranial hemorrhage or infarct. Ventricular size is normal.  Cerebellum unremarkable. Vascular: Unremarkable Skull: Intact Sinuses/Orbits: Paranasal sinuses are clear. Orbits are unremarkable. Other: Mastoid air cells and middle ear cavities are clear. IMPRESSION: 1. No acute intracranial abnormality. 2. Stable chronic small vessel ischemic changes. Electronically Signed   By: Helyn NumbersAshesh  Parikh MD   On: 12/19/2019 16:58   CT CHEST WO CONTRAST  Result Date: 12/19/2019 CLINICAL DATA:  Altered mental status, fever, sepsis, respiratory distress EXAM: CT CHEST WITHOUT CONTRAST TECHNIQUE: Multidetector CT imaging of the chest was performed following the standard protocol without IV contrast. COMPARISON:  None. FINDINGS: Cardiovascular: Cardiac size within normal limits. Mild coronary artery calcification. No pericardial effusion. Central pulmonary arteries are of normal caliber. Thoracic aorta is age-appropriate with mild atherosclerotic calcification noted. No aortic aneurysm identified. Mediastinum/Nodes: No pathologic thoracic adenopathy. Visualized thyroid is unremarkable. Moderate hiatal hernia noted. Lungs/Pleura: There is nodular infiltrate within the posterior segment of the right upper lobe, right middle lobe, and aerated portions of the right lower lobe, in keeping with acute bronchopneumonia in the appropriate clinical setting. Superimposed bibasilar atelectasis, right greater than left. There is bronchial wall thickening, asymmetrically more severe involving the right lung, in keeping with airway inflammation. No pneumothorax or pleural effusion. No central obstructing lesion. 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 Its patency is not well assessed on this noncontrast examination. Musculoskeletal: No chest wall mass or suspicious bone lesions identified. IMPRESSION: Right lung bronchopneumonia with superimposed bibasilar atelectasis. Aortic  Atherosclerosis (ICD10-I70.0). Electronically Signed   By: Helyn NumbersAshesh  Parikh MD   On: 12/19/2019 20:42   US RENAL  Result Date: 12/19/2019 CLINICAL DATA:  Acute kidney injury, urinary tract infection, sepsis. EXAM: RENAL / URINARY TRACT ULTRASOUND COMPLETE COMPARISON:  None. FINDINGS: Right Kidney:  Renal measurements: 13.7 x 6.3 x 7.9 cm = volume: 355 mL. Echogenicity within normal limits. LEFT renal cysts, largest measuring 3.4 cm. No suspicious mass or hydronephrosis visualized. Left Kidney: Renal measurements: 12.7 x 5.9 x 5.8 cm = volume: 225 mL. Echogenicity within normal limits. No mass or hydronephrosis visualized. Bladder: Appears normal for degree of bladder distention. Other: None. IMPRESSION: 1. No acute findings. No hydronephrosis. 2. RIGHT renal cysts. Electronically Signed   By: Bary Richard M.D.   On: 12/19/2019 21:17   DG Chest Port 1 View  Result Date: 12/19/2019 CLINICAL DATA:  Bilateral lower leg swelling and erythema.  Sepsis. EXAM: PORTABLE CHEST 1 VIEW COMPARISON:  07/13/2019 FINDINGS: Poor inspiration. Grossly stable mild cardiomegaly. Interval curvilinear density in the right mid and lower lung zone. Small amount of ill-defined linear density at the medial left lung base without significant change. Positional dextroconvex scoliosis. IMPRESSION: 1. Interval curvilinear atelectasis in the right mid and lower lung zone. 2. Stable mild left basilar atelectasis or scarring. Electronically Signed   By: Beckie Salts M.D.   On: 12/19/2019 17:09    Procedures Procedures (including critical care time)  CRITICAL CARE Performed by: Canary Brim Atanacio Melnyk Total critical care time: 35 minutes Critical care time was exclusive of separately billable procedures and treating other patients. Critical care was necessary to treat or prevent imminent or life-threatening deterioration. Critical care was time spent personally by me on the following activities: development of treatment plan with patient  and/or surrogate as well as nursing, discussions with consultants, evaluation of patient's response to treatment, examination of patient, obtaining history from patient or surrogate, ordering and performing treatments and interventions, ordering and review of laboratory studies, ordering and review of radiographic studies, pulse oximetry and re-evaluation of patient's condition.    Medications Ordered in ED Medications  heparin injection 5,000 Units (5,000 Units Subcutaneous Given 12/19/19 2137)  sodium chloride flush (NS) 0.9 % injection 3 mL (3 mLs Intravenous Given 12/19/19 2137)  acetaminophen (TYLENOL) tablet 650 mg (has no administration in time range)    Or  acetaminophen (TYLENOL) suppository 650 mg (has no administration in time range)  ondansetron (ZOFRAN) tablet 4 mg (has no administration in time range)    Or  ondansetron (ZOFRAN) injection 4 mg (has no administration in time range)  lactated ringers infusion ( Intravenous Stopped 12/19/19 2137)  clindamycin (CLEOCIN) IVPB 600 mg (0 mg Intravenous Stopped 12/19/19 2207)  Chlorhexidine Gluconate Cloth 2 % PADS 6 each (has no administration in time range)  chlorhexidine (PERIDEX) 0.12 % solution 15 mL (15 mLs Mouth Rinse Not Given 12/19/19 2224)  MEDLINE mouth rinse (has no administration in time range)  lactated ringers bolus 1,000 mL (0 mLs Intravenous Stopped 12/19/19 1832)    And  lactated ringers bolus 800 mL (0 mLs Intravenous Stopped 12/19/19 1858)  aztreonam (AZACTAM) 2 g in sodium chloride 0.9 % 100 mL IVPB (0 g Intravenous Stopped 12/19/19 1717)  metroNIDAZOLE (FLAGYL) IVPB 500 mg (0 mg Intravenous Stopped 12/19/19 2002)  vancomycin (VANCOCIN) IVPB 1000 mg/200 mL premix (0 mg Intravenous Stopped 12/19/19 1841)    ED Course  I have reviewed the triage vital signs and the nursing notes.  Pertinent labs & imaging results that were available during my care of the patient were reviewed by me and considered in my medical decision making (see  chart for details).    MDM Rules/Calculators/A&P  Heidi Green is a 64 y.o. 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  Patient brought in by EMS.  I spoke with husband who provided further history.  He reports that patient has felt tired the last few days but today was feeling worse including having a new cough that was nonproductive.  Patient was more short of breath and was using her home 2 to 3 L nasal cannula but could not maintain her oxygen saturations above 86%.  Patient's legs also felt more red and warm at the site of her chronic leg wounds on the left shin and slightly on the right shin.  Patient denies any urinary symptoms, constipation, or diarrhea.  She denies headache, neck pain, neck stiffness.  Husband was concerned she was acting fatigued and tired and slightly confused and altered.  Patient was found to have fever 103 by EMS and was tachycardic and tachypneic and needed to be on 12 L by nonrebreather to maintain her oxygen saturations in the 90s.  On my evaluation, she does have coarse breath sounds with rhonchi, minimal rales.  No murmur.  Chest and abdomen nontender.  Normal bowel sounds.  Legs are edematous with erythema bilaterally left worse than right.  Some bandages were removed and she has some chronic wounds on the left shins.  She does have palpable DP pulses bilaterally.  Patient has normal sensation in the feet but has some weakness on the right compared to left which she reports is at her baseline.  Grip strength also decreased in the right compared to left which she reports is at baseline.  Good pulses present in her upper extremities.  Pupils are symmetric and reactive  normal extract movements.  Patient has some mild aphasia and husband reports it is slightly worse than normal today.  He reports no recent traumas.  No nuchal rigidity.  Patient was made a code sepsis shortly after arrival given the vital signs, increased 12 L oxygen requirement, and potential sources of infection with cough or pneumonia or cellulitis in her legs.  We will get x-rays of her legs to look for evidence of osteomyelitis or subcutaneous gas.  We will get chest x-ray to look for pneumonia.  We will test her for Covid given the ongoing pandemic although she has been vaccinated reportedly.  We will get other labs to look for sepsis or other organ dysfunction.  We will get a head CT given her history of stroke and the worsened aphasia and slight altered mental status.  I spoke with husband who agreed with the plan of care and will anticipate admission after work-up has been completed.  We will start broad-spectrum antibiotics given the concern for sepsis.          5:57 PM Patient's work-up is returned and Covid test is negative.  X-ray shows atelectasis but no confirming evidence of pneumonia.  Urinalysis does not show infection.  X-ray shows soft tissue swelling of the legs but no evidence of osteomyelitis or subcutaneous gas to see lactic acid was not elevated but patient does have elevated leukocytosis.  Creatinine elevated compared to baseline.  Suspect cellulitis is the primary cause of symptoms.  Although Covid is negative, I am still somewhat concerned about Covid.  We will call for  admission for sepsis from likely cellulitis source with worsened oxygen requirement.     Final Clinical Impression(s) / ED Diagnoses Final diagnoses:  Sepsis, due to unspecified organism, unspecified whether acute organ dysfunction present (HCC)  Cough  Hypoxia  Leg erythema    Rx / DC Orders ED Discharge Orders    None     Clinical Impression: 1. Sepsis, due to unspecified organism, unspecified  whether acute organ dysfunction present (HCC)   2. Cough   3. Hypoxia   4. Leg erythema   5. AKI (acute kidney injury) (HCC)     Disposition: Admit  This note was prepared with assistance of Dragon voice recognition software. Occasional wrong-word or sound-a-like substitutions may have occurred due to the inherent limitations of voice recognition software.     Jalexis Breed, Canary Brim, MD 12/19/19 2330    Brenn Deziel, Canary Brim, MD 01/27/20 1355

## 2019-12-19 NOTE — ED Notes (Signed)
EMS placed 20 gauge IV to right pinkie, positional and doesn't flush well. Attempted without success to start PIV to L. Upper arm. Charge RN to attempt ultrasound guided IV.

## 2019-12-19 NOTE — ED Triage Notes (Signed)
Pt arrived via [facility] from home. Husband called about altered mental status and fever. She has had recent infection in her left leg and finished abx 2 days ago.  Was 86% on RA. EMS sat her up and put her on 12L NRB and O2 sat was 97%.  Vitals per EMS Temp: 103.7 HR: 107 bpm RR: 40 CO2 32

## 2019-12-19 NOTE — ED Notes (Signed)
ED TO INPATIENT HANDOFF REPORT  Name/Age/Gender Heidi BarmanLisa M Green 64 y.o. female  Code Status    Code Status Orders  (From admission, onward)         Start     Ordered   12/19/19 2003  Full code  Continuous        12/19/19 2005        Code Status History    Date Active Date Inactive Code Status Order ID Comments User Context   08/19/2018 1248 08/21/2018 1520 Full Code [id number]  Alwyn RenMathews, Elizabeth G, MD Inpatient   11/11/2017 1637 11/14/2017 1845 Full Code [id number]  Calvert Cantorizwan, Saima, MD ED   10/04/2017 1509 10/05/2017 1537 Full Code [id number]  Burna CashSantos-Sanchez, Idalys, MD ED   07/24/2016 1840 08/01/2016 1818 Full Code [id number]  Ulice DashSmith, David R, PA-C Inpatient   07/18/2016 0705 07/19/2016 1511 Full Code [id number]  Eduard ClosKakrakandy, Arshad N, MD ED   06/16/2012 0758 06/17/2012 2038 Full Code [id number]  Laveda Normanti, Chris N, MD Inpatient   Advance Care Planning Activity      Home/SNF/Other Home  Chief Complaint Acute respiratory failure with hypoxia (HCC) [J96.01] Severe sepsis with acute organ dysfunction (HCC) [A41.9, R65.20]  Level of Care/Admitting Diagnosis ED Disposition    ED Disposition Condition Comment   Admit  [facility] [[id number]  Level of Care: Stepdown [14]  Admit to SDU based on following criteria: Respiratory Distress:  Frequent assessment and/or intervention to maintain adequate ventilation/respiration, pulmonary toilet, and respiratory treatment.  Admit to SDU based on following criteria: Severe physiological/psychological symptoms:  Any diagnosis requiring assessment & intervention at least every 4 hours on an ongoing basis to obtain desired patient outcomes including stability and rehabilitation  [facility] or [facility] if equivalent level of care is available:: Yes  Covid Evaluation: Confirmed COVID Negative  Diagnosis: Severe sepsis with acute organ dysfunction [facility](HCC) [[id number]]) [[id number]]  Admitting Physician: Charlsie QuestPATEL, VISHAL R [[id number]]  Attending  Physician: Charlsie QuestPATEL, VISHAL R [[id number]]  Estimated length of stay: past midnight tomorrow  Certification:: I certify this patient will need inpatient services for at least 2 midnights       Medical History Past Medical History:  Diagnosis Date  . Arthritis    in back  . Back pain   . Chest pain 06/16/2012   negative cath with normal LV function  . Headache(784.0)   . Hypertension   . 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 Kidney stones   . Sleep apnea   . Stroke [facility](HCC)     Allergies Allergies  Allergen Reactions  . Garamycin [Gentamicin Sulfate] Hives and Shortness Of Breath  . Other Anaphylaxis    Reaction to HORSE SERUM  . Penicillins Anaphylaxis, Itching and Swelling    Has patient had a PCN reaction causing immediate rash, facial/tongue/throat swelling, SOB or lightheadedness with hypotension: Yes Has patient had a PCN reaction causing severe rash involving mucus membranes or skin necrosis: No Has patient had a PCN reaction that required hospitalization No Has patient had a PCN reaction occurring within the last 10 years: No If all of the above answers are "NO", then may proceed with Cephalosporin use. Reports taking amoxicillin after PCN rxn  . Norvasc [Amlodipine Besylate] Nausea And Vomiting  . Buprenorphine Itching  . Atorvastatin Nausea And Vomiting  . Rosuvastatin Diarrhea and Other (See Comments)    Spasms in hand     IV Location/Drains/Wounds Patient Lines/Drains/Airways Status  Active Line/Drains/Airways    Name Placement date Placement time Site Days   Peripheral IV 12/19/19 Right Other (Comment) 12/19/19  --  Other (Comment)  less than 1   Peripheral IV 12/19/19 Left Forearm 12/19/19  1610  Forearm  less than 1   External Urinary Catheter 05/31/19  0815  --  202   Wound / Incision (Open or Dehisced) 12/31/17 Incision - Dehisced Leg Left  12/31/17  2049  Leg  718   Wound / Incision (Open or Dehisced) 08/19/18  Non-pressure wound Pretibial Left;Mid 08/19/18  1202  Pretibial  487          Labs/Imaging Results for orders placed or performed during the hospital encounter of 12/19/19 (from the past 48 hour(s))  SARS Coronavirus 2 by RT PCR (hospital order, performed in [facility] hospital lab) Nasopharyngeal Nasopharyngeal Swab     Status: None   Collection Time: 12/19/19  3:31 PM   Specimen: Nasopharyngeal Swab  Result Value Ref Range   SARS Coronavirus 2 NEGATIVE NEGATIVE    Comment: (NOTE) SARS-CoV-2 target nucleic acids are NOT DETECTED.  The SARS-CoV-2 RNA is generally detectable in upper and lower respiratory specimens during the acute phase of infection. The lowest concentration of SARS-CoV-2 viral copies this assay can detect is 250 copies / mL. A negative result does not preclude SARS-CoV-2 infection and should not be used as the sole basis for treatment or other patient management decisions.  A negative result may occur with improper specimen collection / handling, submission of specimen other than nasopharyngeal swab, presence of viral mutation(s) within the areas targeted by this assay, and inadequate number of viral copies (<250 copies / mL). A negative result must be combined with clinical observations, patient history, and epidemiological information.  Fact Sheet for Patients:   BoilerBrush.com.cy  Fact Sheet for Healthcare Providers: https://pope.com/  This test is not yet approved or  cleared by the [place] FDA and has been authorized for detection and/or diagnosis of SARS-CoV-2 by FDA under an Emergency Use Authorization (EUA).  This EUA will remain in effect (meaning this test can be used) for the duration of the COVID-19 declaration under Section 564(b)(1) of the Act, 21 U.S.C. section 360bbb-3(b)(1), unless the authorization is terminated or revoked sooner.  Performed at [facility], [address], [place], [place] [address]   Urinalysis, Routine w reflex microscopic Urine, Clean Catch     Status: Abnormal   Collection Time: 12/19/19  3:32 PM  Result Value Ref Range   Color, Urine YELLOW YELLOW   APPearance CLEAR CLEAR   Specific Gravity, Urine 1.012 1.005 - 1.030   pH 9.0 (H) 5.0 - 8.0   Glucose, UA NEGATIVE NEGATIVE mg/dL   Hgb urine dipstick NEGATIVE NEGATIVE   Bilirubin Urine NEGATIVE NEGATIVE   Ketones, ur NEGATIVE NEGATIVE mg/dL   Protein, ur NEGATIVE NEGATIVE mg/dL   Nitrite NEGATIVE NEGATIVE   Leukocytes,Ua NEGATIVE NEGATIVE    Comment: Performed at [facility], [address] [place], [place] [address]  Blood gas, arterial     Status: Abnormal   Collection Time: 12/19/19  3:33 PM  Result Value Ref Range   FIO2 36.00    pH, Arterial 7.424 7.35 - 7.45   pCO2 arterial 36.2 32 - 48 mmHg   pO2, Arterial 73.7 (L) 83 - 108 mmHg   Bicarbonate 23.1 20.0 - 28.0 mmol/L   Acid-base deficit 0.2 0.0 - 2.0 mmol/L   O2 Saturation 94.4 %  Patient temperature 99.8    Allens test (pass/fail) PASS PASS    Comment: Performed at [facility], [address], [place], [place] [address]  Brain natriuretic peptide     Status: Abnormal   Collection Time: 12/19/19  3:33 PM  Result Value Ref Range   B Natriuretic Peptide 118.3 (H) 0.0 - 100.0 pg/mL    Comment: Performed at [facility], [address], [place], [place] [address]  Lactic acid, plasma     Status: None   Collection Time: 12/19/19  4:12 PM  Result Value Ref Range   Lactic Acid, Venous 0.9 0.5 - 1.9 mmol/L    Comment: Performed at [facility], [address] [place], [place] [address]  Comprehensive metabolic panel     Status: Abnormal   Collection Time: 12/19/19  4:12 PM  Result Value Ref Range   Sodium 140 135 - 145 mmol/L   Potassium 4.7 3.5 - 5.1 mmol/L   Chloride 106 98 - 111 mmol/L   CO2 26 22 - 32 mmol/L   Glucose, Bld 96 70  - 99 mg/dL    Comment: Glucose reference range applies only to samples taken after fasting for at least 8 hours.   BUN 24 (H) 8 - 23 mg/dL   Creatinine, Ser 3.53 (H) 0.44 - 1.00 mg/dL   Calcium 8.9 8.9 - 61.4 mg/dL   Total Protein 7.1 6.5 - 8.1 g/dL   Albumin 3.9 3.5 - 5.0 g/dL   AST 19 15 - 41 U/L   ALT 17 0 - 44 U/L   Alkaline Phosphatase 68 38 - 126 U/L   Total Bilirubin 0.7 0.3 - 1.2 mg/dL   GFR calc non Af Amer 30 (L) >60 mL/min   GFR calc Af Amer 35 (L) >60 mL/min   Anion gap 8 5 - 15    Comment: Performed at [facility], [address] [place], [place] [address]  CBC WITH DIFFERENTIAL     Status: Abnormal   Collection Time: 12/19/19  4:12 PM  Result Value Ref Range   WBC 18.6 (H) 4.0 - 10.5 K/uL   RBC 4.74 3.87 - 5.11 MIL/uL   Hemoglobin 14.2 12.0 - 15.0 g/dL   HCT 00.8 36 - 46 %   MCV 94.1 80.0 - 100.0 fL   MCH 30.0 26.0 - 34.0 pg   MCHC 31.8 30.0 - 36.0 g/dL   RDW 67.6 19.5 - 09.3 %   Platelets 243 150 - 400 K/uL   nRBC 0.0 0.0 - 0.2 %   Neutrophils Relative % 82 %   Neutro Abs 15.3 (H) 1.7 - 7.7 K/uL   Lymphocytes Relative 11 %   Lymphs Abs 2.1 0.7 - 4.0 K/uL   Monocytes Relative 5 %   Monocytes Absolute 0.9 0 - 1 K/uL   Eosinophils Relative 1 %   Eosinophils Absolute 0.2 0 - 0 K/uL   Basophils Relative 0 %   Basophils Absolute 0.1 0 - 0 K/uL   Immature Granulocytes 1 %   Abs Immature Granulocytes 0.10 (H) 0.00 - 0.07 K/uL    Comment: Performed at [facility], [address] [place], [place] [address]  Protime-INR     Status: None   Collection Time: 12/19/19  4:12 PM  Result Value Ref Range   Prothrombin Time 14.6 11.4 - 15.2 seconds   INR 1.2 0.8 - 1.2    Comment: (NOTE) INR goal varies based on device  and disease states. Performed at [facility], [address] [place], [place] [address]   APTT     Status: Abnormal   Collection Time: 12/19/19  4:12 PM  Result Value Ref Range   aPTT 39 (H)  24 - 36 seconds    Comment:        IF BASELINE aPTT IS ELEVATED, SUGGEST PATIENT RISK ASSESSMENT BE USED TO DETERMINE APPROPRIATE ANTICOAGULANT THERAPY. Performed at [facility], [address] [place], [place] [address]   Lipase, blood     Status: None   Collection Time: 12/19/19  4:12 PM  Result Value Ref Range   Lipase 25 11 - 51 U/L    Comment: Performed at [facility], [address], [place], [place] [address]   DG Tibia/Fibula Left  Result Date: 12/19/2019 CLINICAL DATA:  Left lower leg swelling and infection. EXAM: LEFT TIBIA AND FIBULA - 2 VIEW COMPARISON:  Left knee dated 07/24/2016 FINDINGS: Diffuse soft tissue swelling. No soft tissue gas, bone destruction or periosteal reaction. Previously demonstrated left knee degenerative changes without significant change. Interval small knee effusion. IMPRESSION: 1. Diffuse soft tissue swelling with no soft tissue gas or underlying bone abnormality 2. Interval small left knee effusion. 3. Stable left knee degenerative changes. Electronically Signed   By: Beckie Salts M.D.   On: 12/19/2019 17:07   DG Tibia/Fibula Right  Result Date: 12/19/2019 CLINICAL DATA:  Right leg swelling and infection. EXAM: RIGHT TIBIA AND FIBULA - 2 VIEW COMPARISON:  None. FINDINGS: Diffuse soft tissue swelling. No bone destruction, periosteal reaction or soft tissue gas. Normal appearing right knee and ankle. IMPRESSION: Diffuse soft tissue swelling without underlying bony abnormality. Electronically Signed   By: Beckie Salts M.D.   On: 12/19/2019 17:08   CT Head Wo Contrast  Result Date: 12/19/2019 CLINICAL DATA:  Altered mental status, sepsis EXAM: CT HEAD WITHOUT CONTRAST TECHNIQUE: Contiguous axial images were obtained from the base of the skull through the vertex without intravenous contrast. COMPARISON:  10/04/2017 FINDINGS: Brain: Normal anatomic configuration. Moderate subcortical and periventricular white matter changes  are present, more prevalent within the a frontal subcortical white matter, stable since prior examination and likely the sequela of small vessel ischemia. Tiny remote infarcts are noted within the cerebellar hemispheres bilaterally. No abnormal intra or extra-axial mass lesion or fluid collection. No abnormal mass effect or midline shift. No evidence of acute intracranial hemorrhage or infarct. Ventricular size is normal. Cerebellum unremarkable. Vascular: Unremarkable Skull: Intact Sinuses/Orbits: Paranasal sinuses are clear. Orbits are unremarkable. Other: Mastoid air cells and middle ear cavities are clear. IMPRESSION: 1. No acute intracranial abnormality. 2. Stable chronic small vessel ischemic changes. Electronically Signed   By: Helyn Numbers MD   On: 12/19/2019 16:58   DG Chest Port 1 View  Result Date: 12/19/2019 CLINICAL DATA:  Bilateral lower leg swelling and erythema.  Sepsis. EXAM: PORTABLE CHEST 1 VIEW COMPARISON:  07/13/2019 FINDINGS: Poor inspiration. Grossly stable mild cardiomegaly. Interval curvilinear density in the right mid and lower lung zone. Small amount of ill-defined linear density at the medial left lung base without significant change. Positional dextroconvex scoliosis. IMPRESSION: 1. Interval curvilinear atelectasis in the right mid and lower lung zone. 2. Stable mild left basilar atelectasis or scarring. Electronically Signed   By: Beckie Salts M.D.   On: 12/19/2019 17:09    Pending Labs [employer] (From admission, onward)          Start  Ordered   12/20/19 0500  HIV Antibody (routine testing w rflx)  (HIV Antibody (Routine testing w reflex) panel)  Tomorrow morning,   R        12/19/19 2005   12/20/19 0500  Cortisol-am, blood  Tomorrow morning,   R        12/19/19 2005   12/20/19 0500  Basic metabolic panel  Tomorrow morning,   R        12/19/19 2005   12/20/19 0500  CBC  Tomorrow morning,   R        12/19/19 2005   12/19/19 2006  Procalcitonin  Daily,   R       12/19/19 2005   12/19/19 2005  Sodium, urine, random  Add-on,   AD        12/19/19 2005   12/19/19 2005  Creatinine, urine, random  Add-on,   AD        12/19/19 2005   12/19/19 1532  Blood Culture (routine x 2)  (Septic presentation on arrival (screening labs, nursing and treatment orders for obvious sepsis))  BLOOD CULTURE X 2,   STAT      12/19/19 1535   12/19/19 1532  Urine culture  (Septic presentation on arrival (screening labs, nursing and treatment orders for obvious sepsis))  ONCE - STAT,   STAT        12/19/19 1535          Vitals/Pain Today's Vitals   12/19/19 1853 12/19/19 1900 12/19/19 1930 12/19/19 2000  BP: (!) 122/55 107/76 118/69 (!) 98/52  Pulse: 90 86 87 90  Resp: 20 (!) 21 (!) 21 (!) 21  Temp: 99 F (37.2 C)     TempSrc: Oral     SpO2: 100% 99% 95% 98%  Weight:      Height:      PainSc: Asleep       Isolation Precautions No active isolations  Medications Medications  heparin injection 5,000 Units (has no administration in time range)  sodium chloride flush (NS) 0.9 % injection 3 mL (has no administration in time range)  acetaminophen (TYLENOL) tablet 650 mg (has no administration in time range)    Or  acetaminophen (TYLENOL) suppository 650 mg (has no administration in time range)  ondansetron (ZOFRAN) tablet 4 mg (has no administration in time range)    Or  ondansetron (ZOFRAN) injection 4 mg (has no administration in time range)  lactated ringers infusion (has no administration in time range)  clindamycin (CLEOCIN) IVPB 600 mg (has no administration in time range)  lactated ringers bolus 1,000 mL (0 mLs Intravenous Stopped 12/19/19 1832)    And  lactated ringers bolus 800 mL (0 mLs Intravenous Stopped 12/19/19 1858)  aztreonam (AZACTAM) 2 g in sodium chloride 0.9 % 100 mL IVPB (0 g Intravenous Stopped 12/19/19 1717)  metroNIDAZOLE (FLAGYL) IVPB 500 mg (0 mg Intravenous Stopped 12/19/19 2002)  vancomycin (VANCOCIN) IVPB 1000 mg/200 mL premix (0 mg  Intravenous Stopped 12/19/19 1841)    Mobility walks with device

## 2019-12-19 NOTE — H&P (Signed)
History and Physical    Heidi Green ZOX:[id number] DOB: 1956/02/23 DOA: 12/19/2019  PCP: Cipriano Bunker, MD  Patient coming from: Home  I have personally briefly reviewed patient's old medical records in [facility]  Chief Complaint: Fevers, chills, altered mental status  HPI: Heidi Green is a 64 y.o. 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  History is limited from patient due to altered mental status and aphasia therefore majority history is obtained from EDP, chart review, and husband by phone.  Husband states that she has right-sided weakness and aphasia at baseline due to prior stroke.  He says she was at her baseline until yesterday morning when she appeared to be weaker than usual but seemed to improve during the day.  This morning she appeared significantly more generally weak, somnolent, and altered.  EMS were called and on their arrival her temperature was 103.7 Fahrenheit with O2 saturation 86% on room air.  She was placed on 12 L NRB with improvement in O2 sat to 97%.  She was brought to the ED for further evaluation and management.  Husband does state that patient was recently seen by her PCP for lower extremity swelling and erythema.  She was started on a 7-day course of Bactrim which she has just completed.  He says he has not seen significant change in appearance of her legs but does state that they have felt more warm than usual.  ED Course:  Initial vitals showed BP 112/48, pulse 107, RR 26, temp 99.8 Fahrenheit, SPO2 reportedly 86% on room air.  Patient was placed on 6 L of O2 via [place] with improved O2 saturation >95%.  Labs show WBC 18.6, hemoglobin 14.2, platelets 243,000, sodium 140,  potassium 4.7, bicarb 26, BUN 24, creatinine 1.73 (0.73 on 07/13/2019), serum glucose 96, LFTs within normal limits, lactic acid 0.9, BNP 118.3.  Lipase 25.  ABG showed pH 7.44, PCO2 36.2, PO2 73.7.  Urinalysis was negative for UTI.  Blood and urine cultures were obtained and pending.  SARS-CoV-2 PCR is negative.  CT head without contrast is negative for acute intracranial abnormality.  Stable chronic small vessel ischemic changes are seen.  Left tibia-fibula x-ray shows diffuse soft tissue swelling without evidence of the soft tissue gas or underlying bony abnormality. Small left knee effusion and stable left knee degenerative changes noted.  Right tibia-fibula x-ray shows diffuse soft tissue swelling without underlying bony abnormality.  Portable chest x-ray shows interval curvilinear atelectasis in the right mid and lower lung zone and stable mild left basilar atelectasis versus scarring.  Patient was given vancomycin, metronidazole, aztreonam, 1.8 L LR followed by maintenance IV fluids.  The hospitalist service was consulted to admit for further evaluation and management.   Review of Systems:  Unable to obtain full review of systems due to altered mental status/aphasia.   Past Medical History:  Diagnosis Date  . Arthritis    in back  . Back pain   . Chest pain 06/16/2012   negative cath with normal LV function  . Headache(784.0)   . Hypertension   . 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 Kidney stones   . Sleep apnea   . Stroke [facility])  Past Surgical History:  Procedure Laterality Date  . ABDOMINAL HYSTERECTOMY    . BACK SURGERY     x 3  . Left ACL repair  2004  . LEFT HEART CATHETERIZATION WITH CORONARY ANGIOGRAM N/A 06/16/2012   Procedure: LEFT HEART CATHETERIZATION WITH CORONARY ANGIOGRAM;  Surgeon: Peter M Swazil[place], MD;  Location: [facility] CATH LAB;  Service: Cardiovascular;  Laterality: N/A;  . lipoma excision from neck  2006  .  LITHOTRIPSY    . TUBAL LIGATION      Social History:  reports that she quit smoking about 7 years ago. Her smoking use included cigarettes. She has a 13.00 pack-year smoking history. She has never used smokeless tobacco. She reports that she does not drink alcohol and does not use drugs.  Allergies  Allergen Reactions  . Garamycin [Gentamicin Sulfate] Hives and Shortness Of Breath  . Other Anaphylaxis    Reaction to HORSE SERUM  . Penicillins Anaphylaxis, Itching and Swelling    Has patient had a PCN reaction causing immediate rash, facial/tongue/throat swelling, SOB or lightheadedness with hypotension: Yes Has patient had a PCN reaction causing severe rash involving mucus membranes or skin necrosis: No Has patient had a PCN reaction that required hospitalization No Has patient had a PCN reaction occurring within the last 10 years: No If all of the above answers are "NO", then may proceed with Cephalosporin use. Reports taking amoxicillin after PCN rxn  . Norvasc [Amlodipine Besylate] Nausea And Vomiting  . Buprenorphine Itching  . Atorvastatin Nausea And Vomiting  . Rosuvastatin Diarrhea and Other (See Comments)    Spasms in hand     Family History  Problem Relation Age of Onset  . Emphysema Mother   . Diabetes Mother      Prior to Admission medications   Medication Sig Start Date End Date Taking? Authorizing Provider  diclofenac (VOLTAREN) 50 MG EC tablet Take 50 mg by mouth 2 (two) times daily.  10/11/16   [provider]  esomeprazole (NEXIUM) 40 MG capsule Take 40 mg by mouth daily. 08/18/18   [provider]  guaifenesin (ROBITUSSIN) 100 MG/5ML syrup Take 200 mg by mouth 3 (three) times daily as needed for cough.    [provider]  HYDROcodone-acetaminophen (NORCO) 7.5-325 MG tablet Take 1 tablet by mouth every 6 (six) hours as needed (pain).     [provider]  lisinopril (PRINIVIL,ZESTRIL) 20 MG tablet 20 mg daily.  01/13/17    [provider]  LYRICA 150 MG capsule Take 150 mg by mouth 2 (two) times daily. 09/06/17   [provider]  mirabegron ER (MYRBETRIQ) 25 MG TB24 tablet Take 25 mg by mouth at bedtime.    [provider]  potassium chloride (K-DUR) 10 MEQ tablet Take 10 mEq by mouth 2 (two) times daily. 05/29/18   [provider]  rOPINIRole (REQUIP) 2 MG tablet Take 2 mg by mouth 2 (two) times daily.     [provider]  triamcinolone cream (KENALOG) 0.1 % Apply 1 application topically daily as needed for wound care. Apply to periwound with each dressing change daily 08/12/18   [provider]  Vitamin D, Ergocalciferol, (DRISDOL) 50000 units CAPS capsule Take 50,000 Units by mouth once a week. 10/31/17   [provider]    Physical Exam: Vitals:   12/19/19 1612 12/19/19 1630 12/19/19 1708 12/19/19 1853  BP: 93/71 109/69 140/65 (!) 122/55  Pulse: (!) 101 (!) 105 88 90  Resp: (!) 23 15 18  20  Temp:   98.5 F (36.9 C) 99 F (37.2 C)  TempSrc:   Oral Oral  SpO2: 93% 91% 98% 100%  Weight:   99.8 kg   Height:   5' (1.524 m)    Constitutional: Obese woman resting supine in bed, somnolent, calm, comfortable, in no acute distress Eyes: PERRL, lids and conjunctivae normal ENMT: Mucous membranes are dry. Posterior pharynx clear of any exudate or lesions.Normal dentition.  Neck: normal, supple, no masses. Respiratory: Coarse upper airway breath sounds with rhonchi.  Normal respiratory effort. No accessory muscle use.  Cardiovascular: Regular rate and rhythm, no murmurs / rubs / gallops.  Bilateral nonpitting lower extremity edema. 2+ pedal pulses. Abdomen: no tenderness, no masses palpated. No hepatosplenomegaly. Bowel sounds positive.  Musculoskeletal: no clubbing / cyanosis. No joint deformity upper and lower extremities. Good ROM, no contractures. Normal muscle tone.  Skin: Edematous nonpitting legs bilaterally with erythema above the ankle almost up  to the knees.  Left anterior shin with chronic wounds that have bandages in place. Neurologic: Limited due to AMS.  Grip strength decreased on the right, moves right upper and lower extremities against gravity.  Moves her left upper and lower extremities spontaneously. Psychiatric: Somnolent, minimally interactive but will occasionally nod head yes or no and follows simple commands.  Limited assessment also due to aphasia.   Labs on Admission: I have personally reviewed following labs and imaging studies  CBC: Recent Labs  Lab 12/19/19 1612  WBC 18.6*  NEUTROABS 15.3*  HGB 14.2  HCT 44.6  MCV 94.1  PLT 243   Basic Metabolic Panel: Recent Labs  Lab 12/19/19 1612  NA 140  K 4.7  CL 106  CO2 26  GLUCOSE 96  BUN 24*  CREATININE 1.73*  CALCIUM 8.9   GFR: Estimated Creatinine Clearance: 34.4 mL/min (A) (by C-G formula based on SCr of 1.73 mg/dL (H)). Liver Function Tests: Recent Labs  Lab 12/19/19 1612  AST 19  ALT 17  ALKPHOS 68  BILITOT 0.7  PROT 7.1  ALBUMIN 3.9   Recent Labs  Lab 12/19/19 1612  LIPASE 25   No results for input(s): AMMONIA in the last 168 hours. Coagulation Profile: Recent Labs  Lab 12/19/19 1612  INR 1.2   Cardiac Enzymes: No results for input(s): CKTOTAL, CKMB, CKMBINDEX, TROPONINI in the last 168 hours. BNP (last 3 results) No results for input(s): PROBNP in the last 8760 hours. HbA1C: No results for input(s): HGBA1C in the last 72 hours. CBG: No results for input(s): GLUCAP in the last 168 hours. Lipid Profile: No results for input(s): CHOL, HDL, LDLCALC, TRIG, CHOLHDL, LDLDIRECT in the last 72 hours. Thyroid Function Tests: No results for input(s): TSH, T4TOTAL, FREET4, T3FREE, THYROIDAB in the last 72 hours. Anemia Panel: No results for input(s): VITAMINB12, FOLATE, FERRITIN, TIBC, IRON, RETICCTPCT in the last 72 hours. Urine analysis:    Component Value Date/Time   COLORURINE YELLOW 12/19/2019 1532   APPEARANCEUR CLEAR  12/19/2019 1532   LABSPEC 1.012 12/19/2019 1532   PHURINE 9.0 (H) 12/19/2019 1532   GLUCOSEU NEGATIVE 12/19/2019 1532   HGBUR NEGATIVE 12/19/2019 1532   BILIRUBINUR NEGATIVE 12/19/2019 1532   KETONESUR NEGATIVE 12/19/2019 1532   PROTEINUR NEGATIVE 12/19/2019 1532   UROBILINOGEN 1.0 02/28/2008 1445   NITRITE NEGATIVE 12/19/2019 1532   LEUKOCYTESUR NEGATIVE 12/19/2019 1532    Radiological Exams on Admission: DG Tibia/Fibula Left  Result Date: 12/19/2019 CLINICAL DATA:  Left lower leg swelling and infection. EXAM: LEFT TIBIA AND FIBULA -  2 VIEW COMPARISON:  Left knee dated 07/24/2016 FINDINGS: Diffuse soft tissue swelling. No soft tissue gas, bone destruction or periosteal reaction. Previously demonstrated left knee degenerative changes without significant change. Interval small knee effusion. IMPRESSION: 1. Diffuse soft tissue swelling with no soft tissue gas or underlying bone abnormality 2. Interval small left knee effusion. 3. Stable left knee degenerative changes. Electronically Signed   By: Beckie Salts M.D.   On: 12/19/2019 17:07   DG Tibia/Fibula Right  Result Date: 12/19/2019 CLINICAL DATA:  Right leg swelling and infection. EXAM: RIGHT TIBIA AND FIBULA - 2 VIEW COMPARISON:  None. FINDINGS: Diffuse soft tissue swelling. No bone destruction, periosteal reaction or soft tissue gas. Normal appearing right knee and ankle. IMPRESSION: Diffuse soft tissue swelling without underlying bony abnormality. Electronically Signed   By: Beckie Salts M.D.   On: 12/19/2019 17:08   CT Head Wo Contrast  Result Date: 12/19/2019 CLINICAL DATA:  Altered mental status, sepsis EXAM: CT HEAD WITHOUT CONTRAST TECHNIQUE: Contiguous axial images were obtained from the base of the skull through the vertex without intravenous contrast. COMPARISON:  10/04/2017 FINDINGS: Brain: Normal anatomic configuration. Moderate subcortical and periventricular white matter changes are present, more prevalent within the a frontal  subcortical white matter, stable since prior examination and likely the sequela of small vessel ischemia. Tiny remote infarcts are noted within the cerebellar hemispheres bilaterally. No abnormal intra or extra-axial mass lesion or fluid collection. No abnormal mass effect or midline shift. No evidence of acute intracranial hemorrhage or infarct. Ventricular size is normal. Cerebellum unremarkable. Vascular: Unremarkable Skull: Intact Sinuses/Orbits: Paranasal sinuses are clear. Orbits are unremarkable. Other: Mastoid air cells and middle ear cavities are clear. IMPRESSION: 1. No acute intracranial abnormality. 2. Stable chronic small vessel ischemic changes. Electronically Signed   By: Helyn Numbers MD   On: 12/19/2019 16:58   DG Chest Port 1 View  Result Date: 12/19/2019 CLINICAL DATA:  Bilateral lower leg swelling and erythema.  Sepsis. EXAM: PORTABLE CHEST 1 VIEW COMPARISON:  07/13/2019 FINDINGS: Poor inspiration. Grossly stable mild cardiomegaly. Interval curvilinear density in the right mid and lower lung zone. Small amount of ill-defined linear density at the medial left lung base without significant change. Positional dextroconvex scoliosis. IMPRESSION: 1. Interval curvilinear atelectasis in the right mid and lower lung zone. 2. Stable mild left basilar atelectasis or scarring. Electronically Signed   By: Beckie Salts M.D.   On: 12/19/2019 17:09    EKG: Independently reviewed. Sinus tachycardia, PVCs, low voltage, incomplete RBBB.  PVC is new when compared to prior.  Assessment/Plan Principal Problem:   Severe sepsis (HCC) Active Problems:   HTN (hypertension)   Dyslipidemia   AKI (acute kidney injury) (HCC)   Sepsis due to cellulitis (HCC)   Acute respiratory failure with hypoxia (HCC)   History of CVA with residual deficit  Heidi Green is a 64 y.o. 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  Severe sepsis due to cellulitis: Patient with sepsis suspected due to cellulitis.  She was tachycardic with pulse 106, tachypneic with respiratory rate 27, febrile to 103.7 Fahrenheit with EMS and with leukocytosis of 18.6.  She has associated new acute kidney injury and MAP of 64 while in the ED.  She has failed outpatient oral antibiotics of Bactrim. -Continue IV  clindamycin (has reported severe penicillin allergy, no known prior cephalosporin use) -Follow blood and urine cultures -Continue IV fluid hydration overnight -SARS-CoV-2 PCR is negative  Acute respiratory failure with hypoxia: Does not require consistent supplemental oxygen at home per husband.  She is requiring 6 L O2 via [place] on admission.  CXR is without obvious etiology.  SARS-CoV-2 PCR is negative. -Obtain CT chest without contrast -Continue supplemental oxygen -Continue CPAP nightly  Acute kidney injury: Creatinine 1.73 on admission compared to 0.73 on 07/13/2019.  In the setting of sepsis, medications (lisinopril, diclofenac, and recent Bactrim use). -Continue IV fluid hydration -Check renal ultrasound -Check urine sodium, creatinine -Monitor strict I/O's -Hold lisinopril, avoid NSAIDs  History of CVA with residual right-sided hemiparesis and aphasia: Chronic and appears stable/at baseline.  Not currently on aspirin or statin therapy.  Hypertension: Hold home lisinopril due to hypotension.  Continue IV fluid hydration.  Hyperlipidemia: Holding Zetia until able to take oral medications reliably.  Chronic pain: Hold home Norco in setting of acute respiratory count with hypoxia.  OSA: Continue CPAP nightly.  DVT prophylaxis: Subcutaneous heparin Code Status: Full code, confirmed with patient's husband Family Communication: Discussed with patient's husband  Elmon ElseStephen Waldrep by phone [phone] Disposition Plan: From home, discharge pending improvement in sepsis physiology, further work-up and management Consults called: None Admission status:  Status is: Inpatient  Remains inpatient appropriate because:Hemodynamically unstable, Altered mental status, Ongoing diagnostic testing needed not appropriate for outpatient work up, IV treatments appropriate due to intensity of illness or inability to take PO and Inpatient level of care appropriate due to severity of illness   Dispo: The patient is from: Home              Anticipated d/c is to: TBD pending clinical progress              Anticipated d/c date is: > 3 days              Patient currently is not medically stable to d/c.   Darreld McleanVishal Ixel Boehning MD [facility]  If 7PM-7AM, please contact night-coverage [email or web address]  12/19/2019, 7:40 PM

## 2019-12-20 DIAGNOSIS — J9601 Acute respiratory failure with hypoxia: Secondary | ICD-10-CM

## 2019-12-20 DIAGNOSIS — N179 Acute kidney failure, unspecified: Secondary | ICD-10-CM

## 2019-12-20 DIAGNOSIS — E785 Hyperlipidemia, unspecified: Secondary | ICD-10-CM

## 2019-12-20 DIAGNOSIS — I6930 Unspecified sequelae of cerebral infarction: Secondary | ICD-10-CM

## 2019-12-20 DIAGNOSIS — I10 Essential (primary) hypertension: Secondary | ICD-10-CM

## 2019-12-20 LAB — BASIC METABOLIC PANEL
Anion gap: 12 (ref 5–15)
BUN: 18 mg/dL (ref 8–23)
CO2: 21 mmol/L — ABNORMAL LOW (ref 22–32)
Calcium: 8.8 mg/dL — ABNORMAL LOW (ref 8.9–10.3)
Chloride: 106 mmol/L (ref 98–111)
Creatinine, Ser: 1.21 mg/dL — ABNORMAL HIGH (ref 0.44–1.00)
GFR calc Af Amer: 54 mL/min — ABNORMAL LOW (ref >60)
GFR calc non Af Amer: 47 mL/min — ABNORMAL LOW (ref >60)
Glucose, Bld: 90 mg/dL (ref 70–99)
Potassium: 4.4 mmol/L (ref 3.5–5.1)
Sodium: 139 mmol/L (ref 135–145)

## 2019-12-20 LAB — CBC
HCT: 41.8 % (ref 36.0–46.0)
Hemoglobin: 13.3 g/dL (ref 12.0–15.0)
MCH: 29.7 pg (ref 26.0–34.0)
MCHC: 31.8 g/dL (ref 30.0–36.0)
MCV: 93.3 fL (ref 80.0–100.0)
Platelets: 218 10*3/uL (ref 150–400)
RBC: 4.48 MIL/uL (ref 3.87–5.11)
RDW: 15.3 % (ref 11.5–15.5)
WBC: 18 10*3/uL — ABNORMAL HIGH (ref 4.0–10.5)
nRBC: 0 % (ref 0.0–0.2)

## 2019-12-20 LAB — CORTISOL-AM, BLOOD: Cortisol - AM: 23 ug/dL — ABNORMAL HIGH (ref 6.7–22.6)

## 2019-12-20 LAB — CREATININE, URINE, RANDOM: Creatinine, Urine: 70.87 mg/dL

## 2019-12-20 LAB — HIV ANTIBODY (ROUTINE TESTING W REFLEX): HIV Screen 4th Generation wRfx: NONREACTIVE

## 2019-12-20 LAB — SODIUM, URINE, RANDOM: Sodium, Ur: 155 mmol/L

## 2019-12-20 LAB — MRSA PCR SCREENING: MRSA by PCR: NEGATIVE

## 2019-12-20 LAB — URINE CULTURE

## 2019-12-20 LAB — PROCALCITONIN
Procalcitonin: <0.1 ng/mL
Procalcitonin: <0.1 ng/mL

## 2019-12-20 MED ORDER — PREGABALIN 75 MG PO CAPS
150.0000 mg | ORAL_CAPSULE | Freq: Two times a day (BID) | ORAL | Status: DC
  Administered 2019-12-20 – 2019-12-22 (×4): 150 mg via ORAL
  Filled 2019-12-20 (×5): qty 2

## 2019-12-20 MED ORDER — MIRABEGRON ER 25 MG PO TB24
50.0000 mg | ORAL_TABLET | Freq: Every day | ORAL | Status: DC
  Administered 2019-12-20 – 2019-12-21 (×2): 50 mg via ORAL
  Filled 2019-12-20 (×4): qty 2

## 2019-12-20 MED ORDER — FERROUS SULFATE 325 (65 FE) MG PO TABS
325.0000 mg | ORAL_TABLET | Freq: Two times a day (BID) | ORAL | Status: DC
  Administered 2019-12-20 – 2019-12-22 (×4): 325 mg via ORAL
  Filled 2019-12-20 (×4): qty 1

## 2019-12-20 MED ORDER — ROPINIROLE HCL 1 MG PO TABS
2.0000 mg | ORAL_TABLET | Freq: Two times a day (BID) | ORAL | Status: DC
  Administered 2019-12-20 – 2019-12-22 (×5): 2 mg via ORAL
  Filled 2019-12-20 (×5): qty 2

## 2019-12-20 MED ORDER — ZOLPIDEM TARTRATE 5 MG PO TABS
5.0000 mg | ORAL_TABLET | Freq: Once | ORAL | Status: AC
  Administered 2019-12-20: 5 mg via ORAL
  Filled 2019-12-20: qty 1

## 2019-12-20 MED ORDER — LEVOFLOXACIN IN D5W 750 MG/150ML IV SOLN
750.0000 mg | INTRAVENOUS | Status: DC
  Administered 2019-12-20: 750 mg via INTRAVENOUS
  Filled 2019-12-20 (×2): qty 150

## 2019-12-20 MED ORDER — EZETIMIBE 10 MG PO TABS
10.0000 mg | ORAL_TABLET | Freq: Every day | ORAL | Status: DC
  Administered 2019-12-20 – 2019-12-21 (×2): 10 mg via ORAL
  Filled 2019-12-20 (×3): qty 1

## 2019-12-20 MED ORDER — PANTOPRAZOLE SODIUM 40 MG PO TBEC
40.0000 mg | DELAYED_RELEASE_TABLET | Freq: Every day | ORAL | Status: DC
  Administered 2019-12-20 – 2019-12-22 (×3): 40 mg via ORAL
  Filled 2019-12-20 (×3): qty 1

## 2019-12-20 MED ORDER — HYDROCODONE-ACETAMINOPHEN 7.5-325 MG PO TABS
1.0000 | ORAL_TABLET | ORAL | Status: DC | PRN
  Administered 2019-12-20 – 2019-12-22 (×8): 1 via ORAL
  Filled 2019-12-20 (×8): qty 1

## 2019-12-20 NOTE — Progress Notes (Signed)
PROGRESS NOTE  MARIACRISTINA Green  PNT:[id number] DOB: 09/04/1955 DOA: 12/19/2019 PCP: Cipriano Bunker, MD  Brief Narrative: Heidi Green is a 64 y.o. 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  EMS were called and on their arrival her temperature was 103.7 Fahrenheit with O2 saturation 86% on room air.  She was placed on 12 L NRB with improvement in O2 sat to 97%.  She was brought to the ED for further evaluation and management.  Initial vitals showed BP 112/48, pulse 107, RR 26, temp 99.8 Fahrenheit, SPO2 reportedly 86% on room air.  Patient was placed on 6 L of O2 via [place] with improved O2 saturation >95%.  Labs show WBC 18.6, hemoglobin 14.2, platelets 243,000, sodium 140, potassium 4.7, bicarb 26, BUN 24, creatinine 1.73 (0.73 on 07/13/2019), serum glucose 96, LFTs within normal limits, lactic acid 0.9, BNP 118.3.  Lipase 25.  ABG showed pH 7.44, PCO2 36.2, PO2 73.7.  Urinalysis was negative for UTI.  Blood and urine cultures were obtained and pending. CT head without contrast is negative for acute intracranial abnormality.  Stable chronic small vessel ischemic changes are seen. LE XR's showed no soft tissue gas or underlying bony abnormalities. Portable chest x-ray shows interval curvilinear atelectasis in the right mid and lower lung zone and stable mild left basilar atelectasis versus scarring.  Patient was given vancomycin, metronidazole, aztreonam, and IV fluids, admitted for severe sepsis, possibly due to cellulitis of bilateral legs. CT chest performed to investigate acute hypoxic respiratory failure and indicates right lung bronchopneumonia.   Assessment &  Plan: Principal Problem:   Severe sepsis (HCC) Active Problems:   HTN (hypertension)   Dyslipidemia   AKI (acute kidney injury) (HCC)   Sepsis due to cellulitis (HCC)   Acute respiratory failure with hypoxia (HCC)   History of CVA with residual deficit   Severe sepsis with acute organ dysfunction (HCC)  Severe sepsis and acute hypoxemic respiratory failure due to right-sided bronchopneumonia:  Cellulitis present on presentation, though pt's legs are now nontender and chronicity as well as failure to respond to full course of bactrim suggests against this being the cause of sepsis.  Patient with sepsis suspected due to cellulitis. SARS-CoV-2 PCR is negative - Monitor leukocytosis of 18.6.   - Switch to 5 day course of levaquin as this resolved a bout of pneumonia previously for patient and she has significant PCN allergy. MRSA PCR negative.  - Follow blood and urine cultures - Decrease IVF, start diet.  OSA:  - Continue CPAP qHS.  Acute kidney injury, ATN: SCr 1.73 on admission, improved with IVF, though not yet back to baseline. Due to ACE, bactrim, NSAIDs and sepsis. FENa ~2.7% suggestive of ATN. Renal U/S with normal echogenicity, no hydronephrosis. Some cysts.  - Continue holding ACE inhibitor, avoid NSAIDs.  - IVF as above - Monitor in AM  History of CVA with residual right-sided hemiparesis and aphasia: Chronic and appears stable/at baseline.  Not currently on aspirin or statin therapy. - PT/OT  Hypertension: - Hold home lisinopril due to hypotension.  Continue IV fluid hydration.  Hyperlipidemia: - Continue zetia  Chronic pain: - Continue home medication: Norco   Obesity: Estimated body mass index is  53.39 kg/m as calculated from the following:   Height as of this encounter: 5' (1.524 m).   Weight as of this encounter: 124 kg.  DVT prophylaxis: Heparin Code Status: Full Family Communication: None at bedside Disposition Plan:  Status is:  Inpatient  Remains inpatient appropriate because:Inpatient level of care appropriate due to severity of illness  Dispo: The patient is from: Home              Anticipated d/c is to: Home              Anticipated d/c date is: 2 days              Patient currently is not medically stable to d/c.  Consultants:   None  Procedures:   None  Antimicrobials:  Vancomycin, aztreonam, flagyl  Clindamycin   Subjective: Irritated by people saying she was crazy on admission. Very hungry and irritated about being NPO. Denies any shortness of breath or wheezing. Denies any pain in the legs, says redness is at baseline, didn't change much with bactrim as an outpatient.   Objective: Vitals:   12/20/19 1000 12/20/19 1100 12/20/19 1200 12/20/19 1300  BP: (!) 151/77 (!) 114/53 125/60 137/71  Pulse: 72 78 75 67  Resp: (!) 30 (!) 30 (!) 29 (!) 26  Temp:    98.8 F (37.1 C)  TempSrc:    Oral  SpO2: 94% 96% 94% 95%  Weight:      Height:        Intake/Output Summary (Last 24 hours) at 12/20/2019 1420 Last data filed at 12/20/2019 1100 Gross per 24 hour  Intake 3350.04 ml  Output 600 ml  Net 2750.04 ml   Filed Weights   12/19/19 1708 12/19/19 2100 12/20/19 0500  Weight: 99.8 kg 125.7 kg 124 kg    Gen: 64 y.o. female in no distress Pulm: Non-labored breathing 6L O2 with right-sided coarse sounds, when taken to room air during encounter SpO2 drops into low 80%'s.  CV: Regular rate and rhythm. No murmur, rub, or gallop. No JVD, Trace pedal edema. GI: Abdomen soft, non-tender, non-distended, with normoactive bowel sounds. No organomegaly or masses felt. Ext: Warm, no deformities Skin: Bilateraly lower legs with bright red ill-defined erythema with edema that is NONTENDER. Some shallow wounds on lateral LLE without exudate. No fluctuance or induration.  Neuro: Alert and oriented. Aphasia at her baseline per pt. R hemiparesis. No new focal neurological deficits. Psych: Judgement and insight  appear normal. Mood & affect appropriate.   Data Reviewed: I have personally reviewed following labs and imaging studies  CBC: Recent Labs  Lab 12/19/19 1612 12/20/19 0323  WBC 18.6* 18.0*  NEUTROABS 15.3*  --   HGB 14.2 13.3  HCT 44.6 41.8  MCV 94.1 93.3  PLT 243 218   Basic Metabolic Panel: Recent Labs  Lab 12/19/19 1612 12/20/19 0323  NA 140 139  K 4.7 4.4  CL 106 106  CO2 26 21*  GLUCOSE 96 90  BUN 24* 18  CREATININE 1.73* 1.21*  CALCIUM 8.9 8.8*   GFR: Estimated Creatinine Clearance: 56.3 mL/min (A) (by C-G formula based on SCr of 1.21 mg/dL (H)). Liver Function Tests: Recent Labs  Lab 12/19/19 1612  AST 19  ALT 17  ALKPHOS 68  BILITOT 0.7  PROT 7.1  ALBUMIN 3.9   Recent Labs  Lab 12/19/19 1612  LIPASE 25   No results for input(s): AMMONIA in the last 168 hours. Coagulation Profile: Recent Labs  Lab  12/19/19 1612  INR 1.2   Cardiac Enzymes: No results for input(s): CKTOTAL, CKMB, CKMBINDEX, TROPONINI in the last 168 hours. BNP (last 3 results) No results for input(s): PROBNP in the last 8760 hours. HbA1C: No results for input(s): HGBA1C in the last 72 hours. CBG: No results for input(s): GLUCAP in the last 168 hours. Lipid Profile: No results for input(s): CHOL, HDL, LDLCALC, TRIG, CHOLHDL, LDLDIRECT in the last 72 hours. Thyroid Function Tests: No results for input(s): TSH, T4TOTAL, FREET4, T3FREE, THYROIDAB in the last 72 hours. Anemia Panel: No results for input(s): VITAMINB12, FOLATE, FERRITIN, TIBC, IRON, RETICCTPCT in the last 72 hours. Urine analysis:    Component Value Date/Time   COLORURINE YELLOW 12/19/2019 1532   APPEARANCEUR CLEAR 12/19/2019 1532   LABSPEC 1.012 12/19/2019 1532   PHURINE 9.0 (H) 12/19/2019 1532   GLUCOSEU NEGATIVE 12/19/2019 1532   HGBUR NEGATIVE 12/19/2019 1532   BILIRUBINUR NEGATIVE 12/19/2019 1532   KETONESUR NEGATIVE 12/19/2019 1532   PROTEINUR NEGATIVE 12/19/2019 1532   UROBILINOGEN 1.0 02/28/2008  1445   NITRITE NEGATIVE 12/19/2019 1532   LEUKOCYTESUR NEGATIVE 12/19/2019 1532   Recent Results (from the past 240 hour(s))  SARS Coronavirus 2 by RT PCR (hospital order, performed in [facility] hospital lab) Nasopharyngeal Nasopharyngeal Swab     Status: None   Collection Time: 12/19/19  3:31 PM   Specimen: Nasopharyngeal Swab  Result Value Ref Range Status   SARS Coronavirus 2 NEGATIVE NEGATIVE Final    Comment: (NOTE) SARS-CoV-2 target nucleic acids are NOT DETECTED.  The SARS-CoV-2 RNA is generally detectable in upper and lower respiratory specimens during the acute phase of infection. The lowest concentration of SARS-CoV-2 viral copies this assay can detect is 250 copies / mL. A negative result does not preclude SARS-CoV-2 infection and should not be used as the sole basis for treatment or other patient management decisions.  A negative result may occur with improper specimen collection / handling, submission of specimen other than nasopharyngeal swab, presence of viral mutation(s) within the areas targeted by this assay, and inadequate number of viral copies (<250 copies / mL). A negative result must be combined with clinical observations, patient history, and epidemiological information.  Fact Sheet for Patients:   BoilerBrush.com.cy  Fact Sheet for Healthcare Providers: https://pope.com/  This test is not yet approved or  cleared by the [place] FDA and has been authorized for detection and/or diagnosis of SARS-CoV-2 by FDA under an Emergency Use Authorization (EUA).  This EUA will remain in effect (meaning this test can be used) for the duration of the COVID-19 declaration under Section 564(b)(1) of the Act, 21 U.S.C. section 360bbb-3(b)(1), unless the authorization is terminated or revoked sooner.  Performed at [facility], [address], [place], [place] [address]   Urine culture      Status: Abnormal   Collection Time: 12/19/19  3:32 PM   Specimen: In/Out Cath Urine  Result Value Ref Range Status   Specimen Description   Final    IN/OUT CATH URINE Performed at [facility], [address] [place], [place] [address]    Special Requests   Final    NONE Performed at [facility], [address], [place], [place] [address]    Culture MULTIPLE SPECIES PRESENT, SUGGEST RECOLLECTION (A)  Final   Report Status 12/20/2019 FINAL  Final  Blood Culture (routine x 2)     Status: None (Preliminary result)   Collection Time: 12/19/19  4:12 PM  Specimen: BLOOD  Result Value Ref Range Status   Specimen Description   Final    BLOOD BLOOD LEFT FOREARM Performed at [facility], [address] [place], [place] [address]    Special Requests   Final    BOTTLES DRAWN AEROBIC AND ANAEROBIC Blood Culture adequate volume Performed at [facility], [address] [place], [place] [address]    Culture   Final    NO GROWTH < 24 HOURS Performed at [facility], [address] [place], [place] [address]    Report Status PENDING  Incomplete  Blood Culture (routine x 2)     Status: None (Preliminary result)   Collection Time: 12/19/19  4:12 PM   Specimen: BLOOD  Result Value Ref Range Status   Specimen Description   Final    BLOOD RIGHT ANTECUBITAL Performed at [facility], [address], [place], [place] [address]    Special Requests   Final    BOTTLES DRAWN AEROBIC ONLY Blood Culture adequate volume Performed at [facility], [address], [place], [place] [address]    Culture   Final    NO GROWTH < 24 HOURS Performed at [facility], [address], [place], [place] [address]    Report Status PENDING  Incomplete  MRSA PCR Screening     Status: None   Collection Time: 12/19/19 10:23 PM   Specimen: Nasal Mucosa; Nasopharyngeal  Result Value Ref  Range Status   MRSA by PCR NEGATIVE NEGATIVE Final    Comment:        The GeneXpert MRSA Assay (FDA approved for NASAL specimens only), is one component of a comprehensive MRSA colonization surveillance program. It is not intended to diagnose MRSA infection nor to guide or monitor treatment for MRSA infections. Performed at [facility], [address], [place], [place] [address]       Radiology Studies: DG Tibia/Fibula Left  Result Date: 12/19/2019 CLINICAL DATA:  Left lower leg swelling and infection. EXAM: LEFT TIBIA AND FIBULA - 2 VIEW COMPARISON:  Left knee dated 07/24/2016 FINDINGS: Diffuse soft tissue swelling. No soft tissue gas, bone destruction or periosteal reaction. Previously demonstrated left knee degenerative changes without significant change. Interval small knee effusion. IMPRESSION: 1. Diffuse soft tissue swelling with no soft tissue gas or underlying bone abnormality 2. Interval small left knee effusion. 3. Stable left knee degenerative changes. Electronically Signed   By: Beckie Salts M.D.   On: 12/19/2019 17:07   DG Tibia/Fibula Right  Result Date: 12/19/2019 CLINICAL DATA:  Right leg swelling and infection. EXAM: RIGHT TIBIA AND FIBULA - 2 VIEW COMPARISON:  None. FINDINGS: Diffuse soft tissue swelling. No bone destruction, periosteal reaction or soft tissue gas. Normal appearing right knee and ankle. IMPRESSION: Diffuse soft tissue swelling without underlying bony abnormality. Electronically Signed   By: Beckie Salts M.D.   On: 12/19/2019 17:08   CT Head Wo Contrast  Result Date: 12/19/2019 CLINICAL DATA:  Altered mental status, sepsis EXAM: CT HEAD WITHOUT CONTRAST TECHNIQUE: Contiguous axial images were obtained from the base of the skull through the vertex without intravenous contrast. COMPARISON:  10/04/2017 FINDINGS: Brain: Normal anatomic configuration. Moderate subcortical and periventricular white matter changes are present, more prevalent  within the a frontal subcortical white matter, stable since prior examination and likely the sequela of small vessel ischemia. Tiny remote infarcts are noted within the cerebellar hemispheres bilaterally. No abnormal intra or extra-axial  mass lesion or fluid collection. No abnormal mass effect or midline shift. No evidence of acute intracranial hemorrhage or infarct. Ventricular size is normal. Cerebellum unremarkable. Vascular: Unremarkable Skull: Intact Sinuses/Orbits: Paranasal sinuses are clear. Orbits are unremarkable. Other: Mastoid air cells and middle ear cavities are clear. IMPRESSION: 1. No acute intracranial abnormality. 2. Stable chronic small vessel ischemic changes. Electronically Signed   By: Helyn Numbers MD   On: 12/19/2019 16:58   CT CHEST WO CONTRAST  Result Date: 12/19/2019 CLINICAL DATA:  Altered mental status, fever, sepsis, respiratory distress EXAM: CT CHEST WITHOUT CONTRAST TECHNIQUE: Multidetector CT imaging of the chest was performed following the standard protocol without IV contrast. COMPARISON:  None. FINDINGS: Cardiovascular: Cardiac size within normal limits. Mild coronary artery calcification. No pericardial effusion. Central pulmonary arteries are of normal caliber. Thoracic aorta is age-appropriate with mild atherosclerotic calcification noted. No aortic aneurysm identified. Mediastinum/Nodes: No pathologic thoracic adenopathy. Visualized thyroid is unremarkable. Moderate hiatal hernia noted. Lungs/Pleura: There is nodular infiltrate within the posterior segment of the right upper lobe, right middle lobe, and aerated portions of the right lower lobe, in keeping with acute bronchopneumonia in the appropriate clinical setting. Superimposed bibasilar atelectasis, right greater than left. There is bronchial wall thickening, asymmetrically more severe involving the right lung, in keeping with airway inflammation. No pneumothorax or pleural effusion. No central obstructing lesion.  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 Its patency is not well assessed on this noncontrast examination. Musculoskeletal: No chest wall mass or suspicious bone lesions identified. IMPRESSION: Right lung bronchopneumonia with superimposed bibasilar atelectasis. Aortic Atherosclerosis (ICD10-I70.0). Electronically Signed   By: Helyn Numbers MD   On: 12/19/2019 20:42   US RENAL  Result Date: 12/19/2019 CLINICAL DATA:  Acute kidney injury, urinary tract infection, sepsis. EXAM: RENAL / URINARY TRACT ULTRASOUND COMPLETE COMPARISON:  None. FINDINGS: Right Kidney: Renal measurements: 13.7 x 6.3 x 7.9 cm = volume: 355 mL. Echogenicity within normal limits. LEFT renal cysts, largest measuring 3.4 cm. No suspicious mass or hydronephrosis visualized. Left Kidney: Renal measurements: 12.7 x 5.9 x 5.8 cm = volume: 225 mL. Echogenicity within normal limits. No mass or hydronephrosis visualized. Bladder: Appears normal for degree of bladder distention. Other: None. IMPRESSION: 1. No acute findings. No hydronephrosis. 2. RIGHT renal cysts. Electronically Signed   By: Bary Richard M.D.   On: 12/19/2019 21:17   DG Chest Port 1 View  Result Date: 12/19/2019 CLINICAL DATA:  Bilateral lower leg swelling and erythema.  Sepsis. EXAM: PORTABLE CHEST 1 VIEW COMPARISON:  07/13/2019 FINDINGS: Poor inspiration. Grossly stable mild cardiomegaly. Interval curvilinear density in the right mid and lower lung zone. Small amount of ill-defined linear density at the medial left lung base without significant change. Positional dextroconvex scoliosis. IMPRESSION: 1. Interval curvilinear atelectasis in the right mid and lower lung zone. 2. Stable mild left basilar atelectasis or scarring. Electronically Signed   By: Beckie Salts M.D.   On: 12/19/2019 17:09    Scheduled Meds:  chlorhexidine  15 mL Mouth Rinse BID    Chlorhexidine Gluconate Cloth  6 each Topical Daily   ezetimibe  10 mg Oral Q supper   ferrous sulfate  325 mg Oral BID WC   heparin  5,000 Units Subcutaneous Q8H   mouth rinse  15 mL Mouth Rinse q12n4p   mirabegron ER  50 mg Oral Q supper  pantoprazole  40 mg Oral Daily   pregabalin  150 mg Oral BID   rOPINIRole  2 mg Oral BID   sodium chloride flush  3 mL Intravenous Q12H   Continuous Infusions:  clindamycin (CLEOCIN) IV Stopped (12/20/19 0537)     LOS: 1 day   Time spent: 35 minutes.  Tyrone Nine, MD [facility] [email or web address] 12/20/2019, 2:20 PM

## 2019-12-20 NOTE — Progress Notes (Signed)
Pharmacy Antibiotic Note  Heidi Green is a 64 y.o. female admitted on 12/19/2019 with pneumonia.  Pharmacy has been consulted for Levaquin dosing.  Patient has hx anaphylaxis with PCN.  No documentation on cephalosporin use per chart review.  Plan: Levaquin 750mg  IV q24h for CrCl>6ml/min MD ordered x 5 days Pharmacy will sign off- no dose adjustments anticipated but will monitor peripherally via electronic surveillance software.   Height: 5' (152.4 cm) Weight: 124 kg (273 lb 5.9 oz) IBW/kg (Calculated) : 45.5  Temp (24hrs), Avg:98.9 F (37.2 C), Min:97.4 F (36.3 C), Max:99.8 F (37.7 C)  Recent Labs  Lab 12/19/19 1612 12/20/19 0323  WBC 18.6* 18.0*  CREATININE 1.73* 1.21*  LATICACIDVEN 0.9  --     Estimated Creatinine Clearance: 56.3 mL/min (A) (by C-G formula based on SCr of 1.21 mg/dL (H)).    Allergies  Allergen Reactions  . Garamycin [Gentamicin Sulfate] Hives and Shortness Of Breath  . Other Anaphylaxis    Reaction to HORSE SERUM  . Penicillins Anaphylaxis, Itching and Swelling    Has patient had a PCN reaction causing immediate rash, facial/tongue/throat swelling, SOB or lightheadedness with hypotension: Yes Has patient had a PCN reaction causing severe rash involving mucus membranes or skin necrosis: No Has patient had a PCN reaction that required hospitalization No Has patient had a PCN reaction occurring within the last 10 years: No If all of the above answers are "NO", then may proceed with Cephalosporin use. Reports taking amoxicillin after PCN rxn  . Norvasc [Amlodipine Besylate] Nausea And Vomiting  . Buprenorphine Itching  . Atorvastatin Nausea And Vomiting  . Rosuvastatin Diarrhea and Other (See Comments)    Spasms in hand     Thank you for allowing pharmacy to be a part of this patient's care.  02/19/20 PharmD, BCPS 12/20/2019 2:45 PM

## 2019-12-20 NOTE — TOC Initial Note (Signed)
Transition of Care [facility]) - Initial/Assessment Note    Patient Details  Name: Heidi Green MRN: [id number] Date of Birth: Sep 16, 1955  Transition of Care [facility]) CM/SW Contact:    Golda Acre, RN Phone Number: 12/20/2019, 7:52 AM  Clinical Narrative:                 From the chart:  64 y.o. 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  History is limited from patient due to altered mental status and aphasia therefore majority history is obtained from EDP, chart review, and husband by phone.  Husband states that she has right-sided weakness and aphasia at baseline due to prior stroke.  He says she was at her baseline until yesterday morning when she appeared to be weaker than usual but seemed to improve during the day.  This morning she appeared significantly more generally weak, somnolent, and altered.  EMS were called and on their arrival her temperature was 103.7 Fahrenheit with O2 saturation 86% on room air.  She was placed on 12 L NRB with improvement in O2 sat to 97% o2 at 6l/min via [place], hypotensive, iv cleocin, iv lr at 100cc/hr,wbc 18.0 Plan is to return to home with husband. Expected Discharge Plan: Home/Self Care Barriers to Discharge: Continued Medical Work up   Patient Goals and CMS Choice Patient states their goals for this hospitalization and ongoing recovery are:: to go home CMS Medicare.gov Compare Post Acute Care list provided to:: Patient    Expected Discharge Plan and Services Expected Discharge Plan: Home/Self Care   Discharge Planning Services: CM Consult   Living arrangements for the past 2 months: Single Family Home                                      Prior Living  Arrangements/Services Living arrangements for the past 2 months: Single Family Home Lives with:: Spouse Patient language and need for interpreter reviewed:: Yes Do you feel safe going back to the place where you live?: Yes      Need for Family Participation in Patient Care: Yes (Comment) Care giver support system in place?: Yes (comment) (husband)   Criminal Activity/Legal Involvement Pertinent to Current Situation/Hospitalization: No - Comment as needed  Activities of Daily Living   ADL Screening (condition at time of admission) Is the patient deaf or have difficulty hearing?: No Does the patient have difficulty seeing, even when wearing glasses/contacts?: No Does the patient have difficulty concentrating, remembering, or making decisions?: No Patient able to express need for assistance with ADLs?: Yes Does the patient have difficulty dressing or bathing?: No Independently performs ADLs?: No Communication: Independent Dressing (OT): Independent Grooming: Independent Feeding: Independent Bathing: Needs assistance Is this a change from baseline?: Change from baseline, expected to last <3 days Toileting: Needs assistance Is this a change from baseline?: Change from baseline, expected to last <3 days In/Out Bed: Needs assistance Is this a change from baseline?: Change from baseline, expected to last <3 days Walks in Home: Needs assistance Is this a change from baseline?: Pre-admission baseline Does the patient have difficulty walking or climbing stairs?: No Weakness of Legs: Right Weakness of Arms/Hands: Both  Permission Sought/Granted                  Emotional Assessment Appearance:: Appears stated age Attitude/Demeanor/Rapport: Engaged Affect (typically observed): Calm Orientation: : Oriented to Place, Oriented to Self, Oriented to Situation, Oriented to  Time Alcohol / Substance Use: Not Applicable Psych Involvement: No (comment)  Admission diagnosis:  Cough  [R05] Hypoxia [R09.02] Acute respiratory failure with hypoxia (HCC) [J96.01] AKI (acute kidney injury) (HCC) [N17.9] Leg erythema [L53.9] Severe sepsis with acute organ dysfunction (HCC) [A41.9, R65.20] Sepsis, due to unspecified organism, unspecified whether acute organ dysfunction present [facility]) [A41.9] Patient Active Problem List   Diagnosis Date Noted  . Acute respiratory failure with hypoxia (HCC) 12/19/2019  . History of CVA with residual deficit 12/19/2019  . Severe sepsis with acute organ dysfunction (HCC) 12/19/2019  . Sepsis due to cellulitis (HCC) 08/19/2018  . Hypotension 08/19/2018  . Chronic low back pain 08/19/2018  . HCAP (healthcare-associated pneumonia) 11/11/2017  . Acute lower UTI 11/11/2017  . AKI (acute kidney injury) (HCC) 11/11/2017  . Lactic acid acidosis 11/11/2017  . Severe sepsis (HCC) 11/11/2017  . Community acquired bilateral lower lobe pneumonia 10/04/2017  . Hypokalemia 08/01/2016  . Class 2 obesity   . HTN (hypertension) 06/16/2012  . Dyslipidemia 06/16/2012  . Back pain 06/16/2012   PCP:  Cipriano Bunker, MD Pharmacy:   [id number] - [place], [place] - [address] [place] [address] Phone: [phone] Fax: [phone]     Social Determinants of Health (SDOH) Interventions    Readmission Risk Interventions No flowsheet data found.

## 2019-12-20 NOTE — Progress Notes (Signed)
Patient states that she takes Ambien at night for sleep. PCP was notified to have an order for Ambien. Awaiting any new orders.

## 2019-12-20 NOTE — Consult Note (Signed)
WOC Nurse Consult Note: Reason for Consult: multiple wound Wound type: Right heel: deep tissue pressure injury LLE pretibial: 2 areas; one partial thickness and one full thickness.  Patient self reports trauma while in motorized chair Pressure Injury POA: Yes Measurement: LLE medial; 2cm x 1.0cm x 0.1cm; 0.5cm x 0.5cm x 0.2cm  Right heel; 3cm x 3cm x 0cm  Wound bed: LLE x 2 both clean; pink, moist R heel: intact; blood blister resolving  Drainage (amount, consistency, odor) none Periwound: intact; hemosiderin staining LLE Dressing procedure/placement/frequency: Single layer of xeroform to the LLE wounds, top with foam. Change xeroform beginning 12/21/19 daily.   Prevalon boot to the right LE to offload the right heel Silicone foam to the right heel to protect   Discussed POC with patient and bedside nurse.  Re consult if needed, will not follow at this time. Thanks  Sharine Cadle [employer], RN,CWOCN, CNS, CWON-AP [phone])

## 2019-12-21 LAB — BASIC METABOLIC PANEL
Anion gap: 9 (ref 5–15)
BUN: 16 mg/dL (ref 8–23)
CO2: 21 mmol/L — ABNORMAL LOW (ref 22–32)
Calcium: 8.7 mg/dL — ABNORMAL LOW (ref 8.9–10.3)
Chloride: 109 mmol/L (ref 98–111)
Creatinine, Ser: 0.95 mg/dL (ref 0.44–1.00)
GFR calc Af Amer: >60 mL/min (ref >60)
GFR calc non Af Amer: >60 mL/min (ref >60)
Glucose, Bld: 107 mg/dL — ABNORMAL HIGH (ref 70–99)
Potassium: 4 mmol/L (ref 3.5–5.1)
Sodium: 139 mmol/L (ref 135–145)

## 2019-12-21 LAB — CBC WITH DIFFERENTIAL/PLATELET
Abs Immature Granulocytes: 0.07 10*3/uL (ref 0.00–0.07)
Basophils Absolute: 0.1 10*3/uL (ref 0.0–0.1)
Basophils Relative: 1 %
Eosinophils Absolute: 0.2 10*3/uL (ref 0.0–0.5)
Eosinophils Relative: 2 %
HCT: 41.5 % (ref 36.0–46.0)
Hemoglobin: 13.5 g/dL (ref 12.0–15.0)
Immature Granulocytes: 1 %
Lymphocytes Relative: 23 %
Lymphs Abs: 2.7 10*3/uL (ref 0.7–4.0)
MCH: 30.1 pg (ref 26.0–34.0)
MCHC: 32.5 g/dL (ref 30.0–36.0)
MCV: 92.4 fL (ref 80.0–100.0)
Monocytes Absolute: 0.8 10*3/uL (ref 0.1–1.0)
Monocytes Relative: 7 %
Neutro Abs: 7.9 10*3/uL — ABNORMAL HIGH (ref 1.7–7.7)
Neutrophils Relative %: 66 %
Platelets: 191 10*3/uL (ref 150–400)
RBC: 4.49 MIL/uL (ref 3.87–5.11)
RDW: 14.7 % (ref 11.5–15.5)
WBC: 11.7 10*3/uL — ABNORMAL HIGH (ref 4.0–10.5)
nRBC: 0 % (ref 0.0–0.2)

## 2019-12-21 MED ORDER — ALBUTEROL SULFATE (2.5 MG/3ML) 0.083% IN NEBU: 2.5000 mg | INHALATION_SOLUTION | RESPIRATORY_TRACT | Status: DC | PRN

## 2019-12-21 MED ORDER — ZOLPIDEM TARTRATE 5 MG PO TABS
5.0000 mg | ORAL_TABLET | Freq: Every evening | ORAL | Status: AC | PRN
  Administered 2019-12-21: 5 mg via ORAL
  Filled 2019-12-21: qty 1

## 2019-12-21 MED ORDER — LEVOFLOXACIN 750 MG PO TABS
750.0000 mg | ORAL_TABLET | Freq: Every day | ORAL | Status: DC
  Administered 2019-12-21: 750 mg via ORAL
  Filled 2019-12-21: qty 1

## 2019-12-21 NOTE — Evaluation (Signed)
Occupational Therapy Evaluation Patient Details Name: Heidi Green MRN: [id number] DOB: 1955/12/28 Today's Date: 12/21/2019    History of Present Illness Heidi Green is a 64 y.o. 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  EMS were called and on their arrival her temperature was 103.7 Fahrenheit with O2 saturation 86% on room air.  She was placed on 12 L NRB with improvement in O2 sat to 97%.  She was brought to the ED for further evaluation and management   Clinical Impression   Pt admitted with the above. Pt currently with functional limitations due to the deficits listed below (see OT Problem List).  Pt will benefit from skilled OT to increase their safety and independence with ADL and functional mobility for ADL to facilitate discharge to venue listed below.   Pt with decreased safety awareness during therapy session.  States husband is in the home but did not provide detail on level of A he is able to provide    Follow Up Recommendations  SNF;Home health OT;Supervision/Assistance - 24 hour    Equipment Recommendations  3 in 1 bedside commode;Hospital bed (WIDE bsc and new hospital bed per pt)    Recommendations for Other Services       Precautions / Restrictions Precautions Precautions: Fall      Mobility Bed Mobility Overal bed mobility: Needs Assistance Bed Mobility: Sit to Supine       Sit to supine: +2 for physical assistance;+2 for safety/equipment;Max assist      Transfers Overall transfer level: Needs assistance   Transfers: Sit to/from Stand;Stand Pivot Transfers Sit to Stand: Mod assist;+2 safety/equipment;+2 physical  assistance Stand pivot transfers: Mod assist;+2 physical assistance;+2 safety/equipment       General transfer comment: VC for safety but pt with decreased safety awareness during OT/ PT session    Balance Overall balance assessment: Needs assistance Sitting-balance support: Single extremity supported Sitting balance-Leahy Scale: Fair     Standing balance support: Single extremity supported Standing balance-Leahy Scale: Poor                             ADL either performed or assessed with clinical judgement   ADL Overall ADL's : Needs assistance/impaired             Lower Body Bathing: Maximal assistance;Sit to/from stand;Sitting/lateral leans;+2 for safety/equipment           Toilet Transfer: Stand-pivot;+2 for safety/equipment;Moderate assistance;BSC;Cueing for sequencing;Cueing for safety Toilet Transfer Details (indicate cue type and reason): pt did not want therapist to A.Pts safety awareness decreased with this transfer and chance of falling high Toileting- Clothing Manipulation and Hygiene: +2 for safety/equipment;Total assistance Toileting - Clothing Manipulation Details (indicate cue type and reason): pt reports having a toilet aid at home       General ADL Comments: pt will need significant A with ADL activity upon DC.  Pt refusing SNF but would benefit from [facility] SNF to increase I with ADL activity.     Vision Patient Visual Report: No change from baseline              Pertinent Vitals/Pain Pain Assessment: No/denies pain  Hand Dominance     Extremity/Trunk Assessment Upper Extremity Assessment Upper Extremity Assessment: RUE deficits/detail RUE Deficits / Details: decreased AROM shoulder flexion, wrist extension and overall function           Communication Communication Communication: Expressive difficulties   Cognition Arousal/Alertness: Awake/alert Behavior During Therapy: Impulsive;WFL for tasks assessed/performed Overall  Cognitive Status: No family/caregiver present to determine baseline cognitive functioning                                 General Comments: pt with decreased safety awarenss and level of A she needed with transfer to [facility].   General Comments               Home Living Family/patient expects to be discharged to:: Private residence Living Arrangements: Spouse/significant other Available Help at Discharge: Family Type of Home: House Home Access: Stairs to enter           [employer] Shower/Tub: Other (comment) (pt explained tub bench but not sure exactly if it is a bench or a shower chair of some sort)         Home Equipment: Walker - 2 wheels;Hospital bed;Bedside commode   Additional Comments: pt needs / wants wide BSC and a hospital bed      Prior Functioning/Environment Level of Independence: Needs assistance    ADL's / Homemaking Assistance Needed: pt reports being mod I with simple ADL activity including toileting            OT Problem List: Decreased strength;Impaired balance (sitting and/or standing);Decreased activity tolerance;Decreased knowledge of use of DME or AE;Obesity      OT Treatment/Interventions: Self-care/ADL training;Patient/family education;Therapeutic activities    OT Goals(Current goals can be found in the care plan section) Acute Rehab OT Goals Patient Stated Goal: home OT Goal Formulation: With patient Time For Goal Achievement: 01/04/20 Potential to Achieve Goals: Good ADL Goals Pt Will Perform Lower Body Bathing: with supervision;sitting/lateral leans;sit to/from stand Pt Will Perform Lower Body Dressing: sitting/lateral leans;sit to/from stand;with supervision Pt Will Transfer to Toilet: with supervision;bedside commode Pt Will Perform Toileting - Clothing Manipulation and hygiene: with supervision;sit to/from stand;sitting/lateral leans  OT Frequency: Min 2X/week   Barriers to D/C:            Co-evaluation               AM-PAC OT "6 Clicks" Daily Activity     Outcome Measure Help from another person eating meals?: A Little Help from another person taking care of personal grooming?: A Little Help from another person toileting, which includes using toliet, bedpan, or urinal?: A Lot Help from another person bathing (including washing, rinsing, drying)?: A Lot Help from another person to put on and taking off regular upper body clothing?: A Little Help from another person to put on and taking off regular lower body clothing?: A Lot 6 Click Score: 15   End of Session Nurse Communication: Mobility status  Activity Tolerance: Patient tolerated treatment well Patient left: in bed;with call bell/phone within reach  OT Visit Diagnosis: Unsteadiness on feet (R26.81);Repeated falls (R29.6);Muscle weakness (generalized) (M62.81)                Time: 4315-4008 OT Time Calculation (min): 21 min Charges:  OT Evaluation $OT Eval Moderate Complexity: 1 Mod  Lise Auer, OT Acute Rehabilitation Services [phone] Office- [phone]     Hebert Dooling, [place]  D 12/21/2019, 4:12 PM

## 2019-12-21 NOTE — Progress Notes (Signed)
PROGRESS NOTE  [name]  WUJ:[id number] DOB: [birth year] DOA: 12/19/2019 PCP: Cipriano Bunker, MD  Brief Narrative: Heidi Green is a 64 y.o. 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  EMS were called and on their arrival her temperature was 103.7 Fahrenheit with O2 saturation 86% on room air.  She was placed on 12 L NRB with improvement in O2 sat to 97%.  She was brought to the ED for further evaluation and management.  Initial vitals showed BP 112/48, pulse 107, RR 26, temp 99.8 Fahrenheit, SPO2 reportedly 86% on room air.  Patient was placed on 6 L of O2 via  [place] with improved O2 saturation >95%.  Labs show WBC 18.6, hemoglobin 14.2, platelets 243,000, sodium 140, potassium 4.7, bicarb 26, BUN 24, creatinine 1.73 (0.73 on 07/13/2019), serum glucose 96, LFTs within normal limits, lactic acid 0.9, BNP 118.3.  Lipase 25.  ABG showed pH 7.44, PCO2 36.2, PO2 73.7.  Urinalysis was negative for UTI.  Blood and urine cultures were obtained and pending. CT head without contrast is negative for acute intracranial abnormality.  Stable chronic small vessel ischemic changes are seen. LE XR's showed no soft tissue gas or underlying bony abnormalities. Portable chest x-ray shows interval curvilinear atelectasis in the right mid and lower lung zone and stable mild left basilar atelectasis versus scarring.  Patient was given vancomycin, metronidazole, aztreonam, and IV fluids, admitted for severe sepsis, possibly due to cellulitis of bilateral legs. CT chest performed to investigate acute hypoxic respiratory failure and indicates right lung bronchopneumonia.   Assessment &  Plan: Principal Problem:   Severe sepsis (HCC) Active Problems:   HTN (hypertension)   Dyslipidemia   AKI (acute kidney injury) (HCC)   Sepsis due to cellulitis (HCC)   Acute respiratory failure with hypoxia (HCC)   History of CVA with residual deficit   Severe sepsis with acute organ dysfunction (HCC)  Severe sepsis and acute hypoxemic respiratory failure due to right-sided bronchopneumonia:  Cellulitis present on presentation, though pt's legs are now nontender and chronicity as well as failure to respond to full course of bactrim suggests against this being the cause of sepsis.  SARS-CoV-2 PCR is negative - Wean oxygen as tolerated. Failed weaning attempt to 4L today, back up to 6L. - Add flutter valve, incentive spirometry. - Continue levaquin as this resolved a bout of pneumonia previously for patient and she has significant PCN allergy (anaphylaxis). MRSA PCR negative. Leukocytosis improving. - With some wheezing, d/w PCCM Anders Simmonds, NP, who reviewed CT. Will get SLP evaluation and trial albuterol prn. - Follow blood and urine cultures  OSA:  - Continue CPAP qHS.  Acute kidney injury, ATN: SCr 1.73 on admission, improved with IVF. Due to ACE, bactrim, NSAIDs and sepsis. FENa ~2.7% suggestive of ATN. Renal U/S with normal echogenicity, no hydronephrosis. Some cysts.  - Continue holding ACE inhibitor, avoid NSAIDs. CrCl is >55ml/min, though creatinine still not quite to baseline. - Avoid nephrotoxins.  History of CVA with residual right-sided hemiparesis and aphasia: Chronic and appears stable/at baseline.  Not currently on aspirin or statin therapy. - PT/OT/SLP.  Hypertension: - Hold home lisinopril due to AKI  Hyperlipidemia: - Continue zetia  Chronic pain: -  Continue home medication: Norco   Obesity: Estimated body mass index is 53.39 kg/m as calculated from the following:   Height as of this encounter: 5' (1.524 m).   Weight as of this encounter: 124  kg.  DVT prophylaxis: Heparin Code Status: Full Family Communication: None at bedside. Spoke with husband by phone. Disposition Plan:  Status is: Inpatient  Remains inpatient appropriate because:Inpatient level of care appropriate due to severity of illness  Dispo: The patient is from: Home              Anticipated d/c is to: Home              Anticipated d/c date is: 2 days              Patient currently is not medically stable to d/c.  Consultants:   None  Procedures:   None  Antimicrobials:  Vancomycin, aztreonam, flagyl  Clindamycin   Levaquin x5 days.  Subjective: Has been eating, no choking, normal urine output. Denies shortness of breath at rest, but does seem to get winded easier when moving around in bed. Some cough.   Objective: Vitals:   12/21/19 1018 12/21/19 1228 12/21/19 1233 12/21/19 1334  BP:    (!) 137/100  Pulse:    84  Resp:    17  Temp:    98.2 F (36.8 C)  TempSrc:    Oral  SpO2: 91% (!) 87% 92% 95%  Weight:      Height:        Intake/Output Summary (Last 24 hours) at 12/21/2019 1601 Last data filed at 12/21/2019 1400 Gross per 24 hour  Intake 1468.22 ml  Output 1100 ml  Net 368.22 ml   Filed Weights   12/19/19 1708 12/19/19 2100 12/20/19 0500  Weight: 99.8 kg 125.7 kg 124 kg   Gen: 64 y.o. female in no distress Pulm: Nonlabored at rest, right-sided expiratory wheezing/still coarse, no dependent crackles. On 6L O2. CV: Regular rate and rhythm. No murmur, rub, or gallop. No JVD, stable dependent edema. GI: Abdomen soft, non-tender, non-distended, with normoactive bowel sounds.  Ext: Warm, no deformities Skin: No new rashes, lesions or ulcers on visualized skin. Neuro: Alert and oriented. No focal neurological deficits. Psych: Judgement and insight appear fair. Mood euthymic & affect congruent. Behavior is appropriate.    Data Reviewed: I have personally reviewed following labs and imaging studies  CBC: Recent Labs  Lab  12/19/19 1612 12/20/19 0323 12/21/19 0525  WBC 18.6* 18.0* 11.7*  NEUTROABS 15.3*  --  7.9*  HGB 14.2 13.3 13.5  HCT 44.6 41.8 41.5  MCV 94.1 93.3 92.4  PLT 243 218 191   Basic Metabolic Panel: Recent Labs  Lab 12/19/19 1612 12/20/19 0323 12/21/19 0525  NA 140 139 139  K 4.7 4.4 4.0  CL 106 106 109  CO2 26 21* 21*  GLUCOSE 96 90 107*  BUN 24* 18 16  CREATININE 1.73* 1.21* 0.95  CALCIUM 8.9 8.8* 8.7*   GFR: Estimated Creatinine Clearance: 71.7 mL/min (by C-G formula based on SCr of 0.95 mg/dL). Liver Function Tests: Recent Labs  Lab 12/19/19 1612  AST 19  ALT 17  ALKPHOS 68  BILITOT 0.7  PROT 7.1  ALBUMIN 3.9   Recent Labs  Lab 12/19/19 1612  LIPASE 25   No results for input(s): AMMONIA in the last 168 hours. Coagulation Profile: Recent Labs  Lab 12/19/19 1612  INR 1.2   Cardiac Enzymes: No results for input(s): CKTOTAL, CKMB,  CKMBINDEX, TROPONINI in the last 168 hours. BNP (last 3 results) No results for input(s): PROBNP in the last 8760 hours. HbA1C: No results for input(s): HGBA1C in the last 72 hours. CBG: No results for input(s): GLUCAP in the last 168 hours. Lipid Profile: No results for input(s): CHOL, HDL, LDLCALC, TRIG, CHOLHDL, LDLDIRECT in the last 72 hours. Thyroid Function Tests: No results for input(s): TSH, T4TOTAL, FREET4, T3FREE, THYROIDAB in the last 72 hours. Anemia Panel: No results for input(s): VITAMINB12, FOLATE, FERRITIN, TIBC, IRON, RETICCTPCT in the last 72 hours. Urine analysis:    Component Value Date/Time   COLORURINE YELLOW 12/19/2019 1532   APPEARANCEUR CLEAR 12/19/2019 1532   LABSPEC 1.012 12/19/2019 1532   PHURINE 9.0 (H) 12/19/2019 1532   GLUCOSEU NEGATIVE 12/19/2019 1532   HGBUR NEGATIVE 12/19/2019 1532   BILIRUBINUR NEGATIVE 12/19/2019 1532   KETONESUR NEGATIVE 12/19/2019 1532   PROTEINUR NEGATIVE 12/19/2019 1532   UROBILINOGEN 1.0 02/28/2008 1445   NITRITE NEGATIVE 12/19/2019 1532   LEUKOCYTESUR  NEGATIVE 12/19/2019 1532   Recent Results (from the past 240 hour(s))  SARS Coronavirus 2 by RT PCR (hospital order, performed in [facility] hospital lab) Nasopharyngeal Nasopharyngeal Swab     Status: None   Collection Time: 12/19/19  3:31 PM   Specimen: Nasopharyngeal Swab  Result Value Ref Range Status   SARS Coronavirus 2 NEGATIVE NEGATIVE Final    Comment: (NOTE) SARS-CoV-2 target nucleic acids are NOT DETECTED.  The SARS-CoV-2 RNA is generally detectable in upper and lower respiratory specimens during the acute phase of infection. The lowest concentration of SARS-CoV-2 viral copies this assay can detect is 250 copies / mL. A negative result does not preclude SARS-CoV-2 infection and should not be used as the sole basis for treatment or other patient management decisions.  A negative result may occur with improper specimen collection / handling, submission of specimen other than nasopharyngeal swab, presence of viral mutation(s) within the areas targeted by this assay, and inadequate number of viral copies (<250 copies / mL). A negative result must be combined with clinical observations, patient history, and epidemiological information.  Fact Sheet for Patients:   BoilerBrush.com.cy  Fact Sheet for Healthcare Providers: https://pope.com/  This test is not yet approved or  cleared by the [place] FDA and has been authorized for detection and/or diagnosis of SARS-CoV-2 by FDA under an Emergency Use Authorization (EUA).  This EUA will remain in effect (meaning this test can be used) for the duration of the COVID-19 declaration under Section 564(b)(1) of the Act, 21 U.S.C. section 360bbb-3(b)(1), unless the authorization is terminated or revoked sooner.  Performed at [facility], [address], [place], [place] [address]   Urine culture     Status: Abnormal   Collection Time: 12/19/19  3:32 PM    Specimen: In/Out Cath Urine  Result Value Ref Range Status   Specimen Description   Final    IN/OUT CATH URINE Performed at [facility], [address] [place], [place] [address]    Special Requests   Final    NONE Performed at [facility], [address], [place], [place] [address]    Culture MULTIPLE SPECIES PRESENT, SUGGEST RECOLLECTION (A)  Final   Report Status 12/20/2019 FINAL  Final  Blood Culture (routine x 2)     Status: None (Preliminary result)   Collection Time: 12/19/19  4:12 PM   Specimen: BLOOD  Result Value Ref Range Status   Specimen Description  Final    BLOOD BLOOD LEFT FOREARM Performed at [facility], [address] [place], [place] [address]    Special Requests   Final    BOTTLES DRAWN AEROBIC AND ANAEROBIC Blood Culture adequate volume Performed at [facility], [address] [place], [place] [address]    Culture   Final    NO GROWTH 2 DAYS Performed at [facility], [address], [place], [place] [address]    Report Status PENDING  Incomplete  Blood Culture (routine x 2)     Status: None (Preliminary result)   Collection Time: 12/19/19  4:12 PM   Specimen: BLOOD  Result Value Ref Range Status   Specimen Description   Final    BLOOD RIGHT ANTECUBITAL Performed at [facility], [address], [place], [place] [address]    Special Requests   Final    BOTTLES DRAWN AEROBIC ONLY Blood Culture adequate volume Performed at [facility], [address] [place], [place] [address]    Culture   Final    NO GROWTH 2 DAYS Performed at [facility], [address], [place], [place] [address]    Report Status PENDING  Incomplete  MRSA PCR Screening     Status: None   Collection Time: 12/19/19 10:23 PM   Specimen: Nasal Mucosa; Nasopharyngeal  Result Value Ref Range Status   MRSA by PCR NEGATIVE NEGATIVE Final    Comment:         The GeneXpert MRSA Assay (FDA approved for NASAL specimens only), is one component of a comprehensive MRSA colonization surveillance program. It is not intended to diagnose MRSA infection nor to guide or monitor treatment for MRSA infections. Performed at [facility], [address], [place], [place] [address]       Radiology Studies: DG Tibia/Fibula Left  Result Date: 12/19/2019 CLINICAL DATA:  Left lower leg swelling and infection. EXAM: LEFT TIBIA AND FIBULA - 2 VIEW COMPARISON:  Left knee dated 07/24/2016 FINDINGS: Diffuse soft tissue swelling. No soft tissue gas, bone destruction or periosteal reaction. Previously demonstrated left knee degenerative changes without significant change. Interval small knee effusion. IMPRESSION: 1. Diffuse soft tissue swelling with no soft tissue gas or underlying bone abnormality 2. Interval small left knee effusion. 3. Stable left knee degenerative changes. Electronically Signed   By: Beckie Salts M.D.   On: 12/19/2019 17:07   DG Tibia/Fibula Right  Result Date: 12/19/2019 CLINICAL DATA:  Right leg swelling and infection. EXAM: RIGHT TIBIA AND FIBULA - 2 VIEW COMPARISON:  None. FINDINGS: Diffuse soft tissue swelling. No bone destruction, periosteal reaction or soft tissue gas. Normal appearing right knee and ankle. IMPRESSION: Diffuse soft tissue swelling without underlying bony abnormality. Electronically Signed   By: Beckie Salts M.D.   On: 12/19/2019 17:08   CT Head Wo Contrast  Result Date: 12/19/2019 CLINICAL DATA:  Altered mental status, sepsis EXAM: CT HEAD WITHOUT CONTRAST TECHNIQUE: Contiguous axial images were obtained from the base of the skull through the vertex without intravenous contrast. COMPARISON:  10/04/2017 FINDINGS: Brain: Normal anatomic configuration. Moderate subcortical and periventricular white matter changes are present, more prevalent within the a frontal subcortical white matter, stable since prior  examination and likely the sequela of small vessel ischemia. Tiny remote infarcts are noted within the cerebellar hemispheres bilaterally. No abnormal intra or extra-axial mass lesion or fluid collection. No abnormal mass effect or midline shift. No evidence of acute  intracranial hemorrhage or infarct. Ventricular size is normal. Cerebellum unremarkable. Vascular: Unremarkable Skull: Intact Sinuses/Orbits: Paranasal sinuses are clear. Orbits are unremarkable. Other: Mastoid air cells and middle ear cavities are clear. IMPRESSION: 1. No acute intracranial abnormality. 2. Stable chronic small vessel ischemic changes. Electronically Signed   By: Helyn NumbersAshesh  Parikh MD   On: 12/19/2019 16:58   CT CHEST WO CONTRAST  Result Date: 12/19/2019 CLINICAL DATA:  Altered mental status, fever, sepsis, respiratory distress EXAM: CT CHEST WITHOUT CONTRAST TECHNIQUE: Multidetector CT imaging of the chest was performed following the standard protocol without IV contrast. COMPARISON:  None. FINDINGS: Cardiovascular: Cardiac size within normal limits. Mild coronary artery calcification. No pericardial effusion. Central pulmonary arteries are of normal caliber. Thoracic aorta is age-appropriate with mild atherosclerotic calcification noted. No aortic aneurysm identified. Mediastinum/Nodes: No pathologic thoracic adenopathy. Visualized thyroid is unremarkable. Moderate hiatal hernia noted. Lungs/Pleura: There is nodular infiltrate within the posterior segment of the right upper lobe, right middle lobe, and aerated portions of the right lower lobe, in keeping with acute bronchopneumonia in the appropriate clinical setting. Superimposed bibasilar atelectasis, right greater than left. There is bronchial wall thickening, asymmetrically more severe involving the right lung, in keeping with airway inflammation. No pneumothorax or pleural effusion. No central obstructing lesion. 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 Its patency is not well assessed on this noncontrast examination. Musculoskeletal: No chest wall mass or suspicious bone lesions identified. IMPRESSION: Right lung bronchopneumonia with superimposed bibasilar atelectasis. Aortic Atherosclerosis (ICD10-I70.0). Electronically Signed   By: Helyn NumbersAshesh  Parikh MD   On: 12/19/2019 20:42   US RENAL  Result Date: 12/19/2019 CLINICAL DATA:  Acute kidney injury, urinary tract infection, sepsis. EXAM: RENAL / URINARY TRACT ULTRASOUND COMPLETE COMPARISON:  None. FINDINGS: Right Kidney: Renal measurements: 13.7 x 6.3 x 7.9 cm = volume: 355 mL. Echogenicity within normal limits. LEFT renal cysts, largest measuring 3.4 cm. No suspicious mass or hydronephrosis visualized. Left Kidney: Renal measurements: 12.7 x 5.9 x 5.8 cm = volume: 225 mL. Echogenicity within normal limits. No mass or hydronephrosis visualized. Bladder: Appears normal for degree of bladder distention. Other: None. IMPRESSION: 1. No acute findings. No hydronephrosis. 2. RIGHT renal cysts. Electronically Signed   By: Bary RichardStan  Maynard M.D.   On: 12/19/2019 21:17   DG Chest Port 1 View  Result Date: 12/19/2019 CLINICAL DATA:  Bilateral lower leg swelling and erythema.  Sepsis. EXAM: PORTABLE CHEST 1 VIEW COMPARISON:  07/13/2019 FINDINGS: Poor inspiration. Grossly stable mild cardiomegaly. Interval curvilinear density in the right mid and lower lung zone. Small amount of ill-defined linear density at the medial left lung base without significant change. Positional dextroconvex scoliosis. IMPRESSION: 1. Interval curvilinear atelectasis in the right mid and lower lung zone. 2. Stable mild left basilar atelectasis or scarring. Electronically Signed   By: Beckie SaltsSteven  Reid M.D.   On: 12/19/2019 17:09    Scheduled Meds: . chlorhexidine  15 mL Mouth Rinse BID  . Chlorhexidine Gluconate Cloth  6 each Topical Daily  . ezetimibe  10 mg  Oral Q supper  . ferrous sulfate  325 mg Oral BID WC  . heparin  5,000 Units Subcutaneous Q8H  . levofloxacin  750 mg Oral Q supper  . mouth rinse  15 mL Mouth Rinse q12n4p  . mirabegron ER  50 mg Oral Q supper  . pantoprazole  40 mg Oral Daily  .  pregabalin  150 mg Oral BID  . rOPINIRole  2 mg Oral BID  . sodium chloride flush  3 mL Intravenous Q12H   Continuous Infusions:    LOS: 2 days   Time spent: 35 minutes.  Tyrone Nine, MD [facility] [email or web address] 12/21/2019, 4:01 PM

## 2019-12-21 NOTE — Evaluation (Signed)
Clinical/Bedside Swallow Evaluation Patient Details  Name: Heidi Green MRN: [id number] Date of Birth: [birth year]  Today's Date: 12/21/2019 Time: SLP Start Time (ACUTE ONLY): 1645 SLP Stop Time (ACUTE ONLY): 1701 SLP Time Calculation (min) (ACUTE ONLY): 16 min  Past Medical History:  Past Medical History:  Diagnosis Date  . Arthritis    in back  . Back pain   . Chest pain 06/16/2012   negative cath with normal LV function  . Headache(784.0)   . Hypertension   . 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 Kidney stones   . Sleep apnea   . Stroke [facility])    Past Surgical History:  Past Surgical History:  Procedure Laterality Date  . ABDOMINAL HYSTERECTOMY    . BACK SURGERY     x 3  . Left ACL repair  2004  . LEFT HEART CATHETERIZATION WITH CORONARY ANGIOGRAM N/A 06/16/2012   Procedure: LEFT HEART CATHETERIZATION WITH CORONARY ANGIOGRAM;  Surgeon: Peter M Swaziland, MD;  Location: [facility] CATH LAB;  Service: Cardiovascular;  Laterality: N/A;  . lipoma excision from neck  2006  . LITHOTRIPSY    . TUBAL LIGATION     HPI:  KATOYA AMATO is a 64 y.o. 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  EMS was called and pt had temperature of 103.7 Fahrenheit with O2 saturation 86% on room air.  Right lung bronchopneumonia with superimposed bibasilar atelectasis CT chest 12/19/2019.   Assessment / Plan / Recommendation Clinical Impression  Patient without focal CN deficits and she absolutely denies dysphagia as soon as SLP informed her of evaluation needed.  Initially pt did not pass 3 ounce Yale water test due to cough within 15 seconds,  however 2nd trial did not result in cough.  With consumption of pills with water - pt demonstrated post-swallow cough with both boluses. This cough did not appear to cause discomfort or changes in vitals but may indicate laryngeal penetration/possible aspiration.  Pt adamantly denies cough is due to aspiration however and states she is a Engineer, civil (consulting) and knows better.  Pt refused to consume anything more than a few bites of bananas, sips of water and her medications with water provided by RN. NO coughing or difficulties overtly apparent with banana.  SLP can not rule out aspiration but given pt's overt denial and decreased awareness, suspect she will not follow swallow compensations or any modifications.  Given pt is refusing to eat or drink currently per RN and pt's spouse and nutrition/hydration likely takes precedent for her recovery.  Recommend continue regular/thin diet as tolerated.  If pt develops recurrent pulmonary infections and is willing to participate, MBS may be beneficial.  Of note, she did undergo MBS in 11/2017 with mild oral deficits present and no aspiration with recommendation for dys3/thin diet.     SLP Visit Diagnosis: Dysphagia, unspecified (R13.10);Dysphagia, oral phase (R13.11)    Aspiration Risk  Mild aspiration risk    Diet Recommendation Regular;Thin liquid (unless pt agreeable to MBS and agreeable to compensation strategy usage)   Liquid Administration via: Cup;Straw Medication Administration: Other (Comment) (as tolerated, consider with puree or icecream if problematic) Compensations: Slow rate;Small sips/bites Postural Changes:  Seated upright at 90 degrees;Remain upright for at least 30 minutes after po intake    Other  Recommendations Oral Care Recommendations: Oral care BID   Follow up Recommendations None      Frequency and Duration     n/a       Prognosis   n /a     Swallow Study   General Date of Onset: 12/21/19 HPI: MINELA BRIDGEWATER is a 64 y.o. 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  EMS was called and pt had temperature of 103.7 Fahrenheit with O2 saturation 86% on room air.  Right lung bronchopneumonia with superimposed bibasilar atelectasis CT chest 12/19/2019. Type of Study: Bedside Swallow Evaluation Diet Prior to this Study: Regular;Thin liquids Temperature Spikes Noted: No Respiratory Status: Nasal cannula History of Recent Intubation: No Behavior/Cognition: Alert;Cooperative;Pleasant mood Oral Cavity Assessment: Within Functional Limits Oral Care Completed by SLP: No Oral Cavity - Dentition: Other (Comment);Edentulous (edentulous) Vision: Functional for self-feeding Self-Feeding Abilities: Able to feed self Patient Positioning: Upright in bed Baseline Vocal Quality: Normal Volitional Cough: Strong Volitional Swallow: Able to elicit    Oral/Motor/Sensory Function Overall Oral Motor/Sensory Function: Within functional limits   Ice Chips Ice chips: Not tested   Thin Liquid Other Comments: Patient did not pass 3 ounce Yale water test with first attempt due to cough within 15 seconds following 3 ounce consumption. 2nd trial was passed without cough.  Small boluses consumed via straw were not followed by coughing.    Nectar Thick Nectar Thick Liquid: Not tested   Honey Thick Honey Thick Liquid: Not tested   Puree Puree: Not tested Other Comments: pt declined to consume pudding or applesauce   Solid     Solid: Within functional limits Presentation: Self Fed      Chales Abrahams 12/21/2019,5:53 PM   Rolena Infante, MS [facility] SLP Acute Rehab Services Office [phone]

## 2019-12-21 NOTE — Progress Notes (Signed)
Penicillin Allergy Assessment    Patient history of penicillin or beta lactam allergy: Heidi Green reports an allergy to penicillin when she had a tooth infection in [place] when she was 64 years old. She reports that the dentist gave her penicillin for 1 week and she had no issues. After the first week, the dentist extended her prescription for another week because she got some rice stuck in her tooth- On the second round of penicillin, she experienced swelling around her eyes and shortness of breath within an hour of the first dose being given.    I asked Heidi Green if she had tolerated any medications such as amoxicillin, cephalexin, or cefdinir since that time. She believes that she tolerated amoxicillin as well as cephalosporins.    She does not want to be re-challenged with penicillin antibiotics but would be willing to try cephalosporins.    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   Sharin Mons, PharmD, BCPS, BCIDP Infectious Diseases Clinical Pharmacist Phone: [phone] 12/21/2019 2:40 PM

## 2019-12-21 NOTE — Evaluation (Addendum)
Physical Therapy Evaluation Patient Details Name: Heidi Green MRN: [id number] DOB: 1956/03/09 Today's Date: 12/21/2019   History of Present Illness  64 yo female admitted with sepsis. Hx of CVA with R residual weakness, chronic back pain, OA, obesity, anemia, chronic leg wounds  Clinical Impression  On eval, pt required Min-Mod +2 for mobility. She was able to perform a modified stand pivot x 2, bed<>bsc. Pt demonstrates unsafe technique but she insists on performing tasks in her preferred manner. O2 89% on RA during session. Pt c/o previous stay at [facility] after her CVA in 2018. It is unlikely that she will agree to ST rehab at [facility]. Should pt return home, recommend HHPT if she is agreeable.     Follow Up Recommendations SNF (HHPT and 24 hour supervision/assist if pt declines placement)    Equipment Recommendations  Hospital Bed; Bariatric 3n1    Recommendations for Other Services OT consult     Precautions / Restrictions Precautions Precautions: Fall Restrictions Weight Bearing Restrictions: No      Mobility  Bed Mobility Overal bed mobility: Needs Assistance Bed Mobility: Supine to Sit;Sit to Supine     Supine to sit: Mod assist;+2 for safety/equipment Sit to supine: Mod assist;+2 for safety/equipment   General bed mobility comments: Assist for trunk and bil LEs. Increased time. Heavy reliance on bedrail.  Transfers Overall transfer level: Needs assistance   Transfers: Stand Pivot Transfers Sit to Stand: Min assist;+2 safety/equipment;+2 physical assistance Stand pivot transfers: Min assist;+2 physical assistance;+2 safety/equipment       General transfer comment: Mod stand pivot, bed<>bsc. Pt only able to complete ~3/4 of pivotal turn before plopping on sitting surface. Decreased safety awareness. Cues for safety.  Ambulation/Gait             General Gait Details: nonambulatory  Stairs            Wheelchair Mobility    Modified Rankin (Stroke Patients  Only)       Balance Overall balance assessment: Needs assistance Sitting-balance support: Single extremity supported Sitting balance-Leahy Scale: Fair     Standing balance support: Bilateral upper extremity supported Standing balance-Leahy Scale: Poor Standing balance comment: pt relies heavily on either bedrails or armrests                             Pertinent Vitals/Pain Pain Assessment: Faces Faces Pain Scale: Hurts little more Pain Location: back -chronic Pain Descriptors / Indicators: Sore;Discomfort Pain Intervention(s): Monitored during session;Repositioned    Home Living Family/patient expects to be discharged to:: Unsure Living Arrangements: Spouse/significant other Available Help at Discharge: Family Type of Home: House Home Access: Stairs to enter;Ramped entrance       Home Equipment: Environmental consultant - 2 wheels;Hospital bed;Bedside commode (stair glide/lift) Additional Comments: pt needs / wants wide BSC and a hospital bed    Prior Function Level of Independence: Needs assistance   Gait / Transfers Assistance Needed: nonambulatory. per pt, she was able to perform transfers to/from [facility] with Mod Ind  ADL's / Homemaking Assistance Needed: pt reports being mod I with simple ADL activity including toileting        Hand Dominance        Extremity/Trunk Assessment   Upper Extremity Assessment Upper Extremity Assessment: Defer to OT evaluation RUE Deficits / Details: decreased AROM shoulder flexion, wrist extension and overall function    Lower Extremity Assessment Lower Extremity Assessment: RLE deficits/detail RLE Deficits / Details: residual  deficits/R hemi/R foot drop    Cervical / Trunk Assessment Cervical / Trunk Assessment: Normal  Communication   Communication: Expressive difficulties  Cognition Arousal/Alertness: Awake/alert Behavior During Therapy: WFL for tasks assessed/performed Overall Cognitive Status: No family/caregiver present to  determine baseline cognitive functioning                                 General Comments: pt with decreased safety awareness and level of A she needed with transfer to [facility].      General Comments      Exercises     Assessment/Plan    PT Assessment Patient needs continued PT services  PT Problem List Decreased strength;Decreased mobility;Decreased activity tolerance;Decreased balance;Decreased safety awareness;Pain;Decreased knowledge of use of DME       PT Treatment Interventions DME instruction;Therapeutic activities;Therapeutic exercise;Patient/family education;Functional mobility training;Balance training    PT Goals (Current goals can be found in the Care Plan section)  Acute Rehab PT Goals Patient Stated Goal: home PT Goal Formulation: With patient Time For Goal Achievement: 01/04/20 Potential to Achieve Goals: Fair    Frequency Min 3X/week   Barriers to discharge        Co-evaluation               AM-PAC PT "6 Clicks" Mobility  Outcome Measure Help needed turning from your back to your side while in a flat bed without using bedrails?: A Lot Help needed moving from lying on your back to sitting on the side of a flat bed without using bedrails?: A Lot Help needed moving to and from a bed to a chair (including a wheelchair)?: A Lot Help needed standing up from a chair using your arms (e.g., wheelchair or bedside chair)?: A Lot Help needed to walk in hospital room?: Total Help needed climbing 3-5 steps with a railing? 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

## 2019-12-22 LAB — BASIC METABOLIC PANEL
Anion gap: 13 (ref 5–15)
BUN: 14 mg/dL (ref 8–23)
CO2: 20 mmol/L — ABNORMAL LOW (ref 22–32)
Calcium: 9 mg/dL (ref 8.9–10.3)
Chloride: 106 mmol/L (ref 98–111)
Creatinine, Ser: 0.78 mg/dL (ref 0.44–1.00)
GFR calc Af Amer: >60 mL/min (ref >60)
GFR calc non Af Amer: >60 mL/min (ref >60)
Glucose, Bld: 100 mg/dL — ABNORMAL HIGH (ref 70–99)
Potassium: 3.7 mmol/L (ref 3.5–5.1)
Sodium: 139 mmol/L (ref 135–145)

## 2019-12-22 LAB — CBC
HCT: 44.2 % (ref 36.0–46.0)
Hemoglobin: 14.3 g/dL (ref 12.0–15.0)
MCH: 29.3 pg (ref 26.0–34.0)
MCHC: 32.4 g/dL (ref 30.0–36.0)
MCV: 90.6 fL (ref 80.0–100.0)
Platelets: 216 10*3/uL (ref 150–400)
RBC: 4.88 MIL/uL (ref 3.87–5.11)
RDW: 14.4 % (ref 11.5–15.5)
WBC: 10.2 10*3/uL (ref 4.0–10.5)
nRBC: 0 % (ref 0.0–0.2)

## 2019-12-22 MED ORDER — ALBUTEROL SULFATE HFA 108 (90 BASE) MCG/ACT IN AERS: 2.0000 | INHALATION_SPRAY | Freq: Four times a day (QID) | RESPIRATORY_TRACT | 2 refills | Status: AC | PRN

## 2019-12-22 MED ORDER — LEVOFLOXACIN 750 MG PO TABS: 750.0000 mg | ORAL_TABLET | Freq: Every day | ORAL | 0 refills | Status: DC

## 2019-12-22 NOTE — Progress Notes (Signed)
SATURATION QUALIFICATIONS: (This note is used to comply with regulatory documentation for home oxygen)  Patient Saturations on Room Air at Rest = 92%  Patient Saturations on Room Air while Ambulating = 88% (transferring to [facility], does not ambulate)  Patient Saturations on 2 Liters of oxygen while Ambulating = 94%  Please briefly explain why patient needs home oxygen: Short of breath at times during daily cares. Wears home O2 @ 2LNC occasionally.

## 2019-12-22 NOTE — Progress Notes (Signed)
Patient called out and states that she need to use the restroom. Wanted to go to bathroom but has right sided weakness due to a previous stroke- prior to arrival this hospital stay- agreed to use [facility]- urged the need to show proof of self care and transfer to bsc from left side. Showed CNA and RN the removal of depend, twist and transfer and transfer back to bed. Able to place self in bed for comfort. Minimal assistance needed from CNA and or RN.  Left leg wound xerofoam changed as well. PRN pain med given too. Advised patient that she needs to eat prior to going home today

## 2019-12-22 NOTE — Care Management (Signed)
    Durable Medical Equipment  (From admission, onward)         Start     Ordered   12/22/19 1318  For home use only DME Hospital bed  Once       Question Answer Comment  Length of Need Lifetime   The above medical condition requires: Patient requires the ability to reposition frequently   Bed type Semi-electric   Trapeze Bar Yes   Support Surface: Gel Overlay      12/22/19 1317   12/22/19 1315  For home use only DME 3 n 1  Once       Comments: Bariatric 3n1-patient has body habitus.   12/22/19 1315

## 2019-12-22 NOTE — TOC Transition Note (Signed)
Transition of Care [facility]) - CM/SW Discharge Note   Patient Details  Name: Heidi Green MRN: [id number] Date of Birth: 1955-05-06  Transition of Care [facility]) CM/SW Contact:  Lanier Clam, RN Phone Number: 12/22/2019, 1:15 PM   Clinical Narrative: d/c home w/HHC-HHRN/PT/SLP-[facility] able to accept-spouse agree. DME ordered-hospital bed,bariatric 3n1-body habitus-[employer] health rep Ian Malkin aware of delivery. Patient can d/c home w/family & not wait for dme delivery.No further CM needs.       Barriers to Discharge: Continued Medical Work up   Patient Goals and CMS Choice Patient states their goals for this hospitalization and ongoing recovery are:: to go home CMS Medicare.gov Compare Post Acute Care list provided to:: Patient    Discharge Placement                       Discharge Plan and Services   Discharge Planning Services: CM Consult                                 Social Determinants of Health (SDOH) Interventions     Readmission Risk Interventions No flowsheet data found.

## 2019-12-22 NOTE — Discharge Summary (Signed)
Physician Discharge Summary  Heidi Green OZH:[id number] DOB: 02/27/1956 DOA: 12/19/2019  PCP: Cipriano BunkerKumar, Pardeep, MD  Admit date: 12/19/2019 Discharge date: 12/22/2019  Time spent: 45 minutes  Recommendations for Outpatient Follow-up:  1. Complete course of Levaquin in the outpatient setting 2. Will need chest x-ray in about 1 month 3. Recommend CBC and differential as well as Chem-12 in 1 week at PCP office 4. Recommend continued management of strokelike symptoms as an outpatient in addition  Discharge Diagnoses:  Principal Problem:   Severe sepsis (HCC) Active Problems:   HTN (hypertension)   Dyslipidemia   AKI (acute kidney injury) (HCC)   Sepsis due to cellulitis (HCC)   Acute respiratory failure with hypoxia (HCC)   History of CVA with residual deficit   Severe sepsis with acute organ dysfunction [facility](HCC)   Discharge Condition: Improved  Diet recommendation: Heart healthy  Filed Weights   12/19/19 2100 12/20/19 0500 12/22/19 0500  Weight: 125.7 kg 124 kg 121 kg    History of present illness:  64 year old white female 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 HTN HLD chronic lower extremity wounds IDA OSA on CPAP chronically Chronic pain syndrome RLS admitted 12/19/2019 fever chills hypoxia with T-max of 103.7 Needed 6 L of oxygen on admission WBC was 18 bicarb 26 BUN/creatinine 24/1.7 ABG showed pH of 7.4 PCO2 36 Patient was given vancomycin Flagyl aztreonam and fluids for severe sepsis  Hospital Course:  Severe sepsis on admission Initial thought was lower extremity cellulitis however patient was found to have right-sided bronchopneumonia on CT scan performed earlier in admission She was given Levaquin because she apparently had anaphylaxis to penicillin Leukocytosis completely improved PCCM saw the patient recommended SLP eval-patient completely refuted SLP follow-up and management and wanted to tolerate her own diet Patient was recommended that dysphagia 3  diet however elected to eat what ever she wanted and her own type of diet She needed maybe 6 L of oxygen earlier in hospital stay but gradually came down to about 2 L on discharge and then was on room air by the time of discharge and will resume nightly oxygen with the CPAP  OSA on CPAP Continue nasal oxygen 2 L Get x-ray in the outpatient setting and titrate as needed  ATN and AKI on admission creatinine was 1.7 it improved with IV fluid It was felt to be secondary to prerenal azotemia secondary to ACE NSAIDs sepsis Fina was 2.7 suggestive of ATN Renal ultrasound was normal Lisinopril to possibly be resumed in the outpatient setting  History of CVA with right hemiparesis Chronic stable and appears at baseline not currently on aspirin or statin therapy Home health ordered on discharge  Lower extremity wounds followed by [place] pain specialist Patient has been seen by them for several years and has been doing fair She will continue dressings as per recommendations from wound nurse and will continue to follow with them in the outpatient setting  Hyperlipidemia continue statin  Obesity BMI 53 Severely limits the patient's mobility in addition to ROM Home health has been ordered in the outpatient setting  HTN Note that we have held lisinopril due to AKI will need outpatient management of the same     Discharge Exam: Vitals:   12/21/19 2030 12/22/19 0552  BP: 132/66 132/69  Pulse: 86 79  Resp: (!) 22 19  Temp: 98.4 F (36.9 C) 98.1 F (36.7 C)  SpO2: 93% 92%    General: Awake alert coherent no distress EOMI NCAT no focal  deficit Cardiovascular: S1-S2 no murmur rub or gallop no crackles no rales Respiratory: Clear no added sound no rales no rhonchi Abdomen soft nontender nondistended no rebound Limited ROM on the right side in addition to slightly weaker with decreased straight arm raise straight leg raise on the right side Skin of the lower extremity on the left has  multiple small wounds in the venous area distribution medially which are less than 2 cm  Discharge Instructions    Allergies as of 12/22/2019      Reactions   Garamycin [gentamicin Sulfate] Hives, Shortness Of Breath   Other Anaphylaxis   Reaction to HORSE SERUM   Penicillins Anaphylaxis, Itching, Swelling   Swelling and shortness of breath after taking penicillin for a tooth infection when patient was 24. Per her account, she believes that she has taken amoxicillin and cephalosporins since that time and had no issues.    Norvasc [amlodipine Besylate] Nausea And Vomiting   Buprenorphine Itching   Atorvastatin Nausea And Vomiting   Rosuvastatin Diarrhea, Other (See Comments)   Spasms in hand      Medication List    STOP taking these medications   diclofenac 50 MG EC tablet Commonly known as: VOLTAREN   lisinopril 20 MG tablet Commonly known as: ZESTRIL   sulfamethoxazole-trimethoprim 800-160 MG tablet Commonly known as: BACTRIM DS     TAKE these medications   albuterol 108 (90 Base) MCG/ACT inhaler Commonly known as: VENTOLIN HFA Inhale 2 puffs into the lungs every 6 (six) hours as needed for wheezing or shortness of breath.   esomeprazole 40 MG capsule Commonly known as: NEXIUM Take 40 mg by mouth daily with supper.   ezetimibe 10 MG tablet Commonly known as: ZETIA Take 10 mg by mouth daily with supper.   FeroSul 325 (65 FE) MG tablet Generic drug: ferrous sulfate Take 325 mg by mouth 2 (two) times daily with a meal.   HYDROcodone-acetaminophen 7.5-325 MG tablet Commonly known as: NORCO Take 1 tablet by mouth 4 (four) times daily.   levofloxacin 750 MG tablet Commonly known as: LEVAQUIN Take 1 tablet (750 mg total) by mouth daily with supper.   Myrbetriq 50 MG Tb24 tablet Generic drug: mirabegron ER Take 50 mg by mouth daily with supper.   pregabalin 150 MG capsule Commonly known as: LYRICA Take 150 mg by mouth 2 (two) times daily.   rOPINIRole 2 MG  tablet Commonly known as: REQUIP Take 2 mg by mouth 2 (two) times daily.   Santyl ointment Generic drug: collagenase Apply 1 application topically daily. Apply to legs with each dressing change   Vitamin D (Ergocalciferol) 1.25 MG (50000 UNIT) Caps capsule Commonly known as: DRISDOL Take 50,000 Units by mouth every Sunday.   zolpidem 10 MG tablet Commonly known as: AMBIEN Take 5 mg by mouth at bedtime.            Discharge Care Instructions  (From admission, onward)         Start     Ordered   12/22/19 0000  Discharge wound care:       Comments: 12/21/19 0500   Wound care  Daily      Comments: Single layer of xeroform over the LLE wounds; top with foam.  Ok to lift foam to replace the xeroform daily.  12/20/19 1010  12/20/19 0958   Foam dressing  Once      Comments: Silicone foam dressings to the right heel change every 3 days. Assess under dressings each shift  for any acute changes in the wounds.   12/21/19 0500   Wound care  Daily      Comments: Single layer of xeroform over the LLE wounds; top with foam.  Ok to lift foam to replace the xeroform daily.  12/20/19 1010  12/20/19 0958   Foam dressing  Once      Comments: Silicone foam dressings to the right heel change every 3 days. Assess under dressings each shift for any acute changes in the wounds.   12/21/19 0500   Wound care  Daily      Comments: Single layer of xeroform over the LLE wounds; top with foam.  Ok to lift foam to replace the xeroform daily.  12/20/19 1010  12/20/19 0958   Foam dressing  Once      Comments: Silicone foam dressings to the right heel change every 3 days. Assess under dressings each shift for any acute changes in the wounds.   12/22/19 1201         Allergies  Allergen Reactions  . Garamycin [Gentamicin Sulfate] Hives and Shortness Of Breath  . Other Anaphylaxis    Reaction to HORSE SERUM  . Penicillins Anaphylaxis, Itching and Swelling    Swelling and shortness of  breath after taking penicillin for a tooth infection when patient was 24. Per her account, she believes that she has taken amoxicillin and cephalosporins since that time and had no issues.   . Norvasc [Amlodipine Besylate] Nausea And Vomiting  . Buprenorphine Itching  . Atorvastatin Nausea And Vomiting  . Rosuvastatin Diarrhea and Other (See Comments)    Spasms in hand       The results of significant diagnostics from this hospitalization (including imaging, microbiology, ancillary and laboratory) are listed below for reference.    Significant Diagnostic Studies: DG Tibia/Fibula Left  Result Date: 12/19/2019 CLINICAL DATA:  Left lower leg swelling and infection. EXAM: LEFT TIBIA AND FIBULA - 2 VIEW COMPARISON:  Left knee dated 07/24/2016 FINDINGS: Diffuse soft tissue swelling. No soft tissue gas, bone destruction or periosteal reaction. Previously demonstrated left knee degenerative changes without significant change. Interval small knee effusion. IMPRESSION: 1. Diffuse soft tissue swelling with no soft tissue gas or underlying bone abnormality 2. Interval small left knee effusion. 3. Stable left knee degenerative changes. Electronically Signed   By: Beckie Salts M.D.   On: 12/19/2019 17:07   DG Tibia/Fibula Right  Result Date: 12/19/2019 CLINICAL DATA:  Right leg swelling and infection. EXAM: RIGHT TIBIA AND FIBULA - 2 VIEW COMPARISON:  None. FINDINGS: Diffuse soft tissue swelling. No bone destruction, periosteal reaction or soft tissue gas. Normal appearing right knee and ankle. IMPRESSION: Diffuse soft tissue swelling without underlying bony abnormality. Electronically Signed   By: Beckie Salts M.D.   On: 12/19/2019 17:08   CT Head Wo Contrast  Result Date: 12/19/2019 CLINICAL DATA:  Altered mental status, sepsis EXAM: CT HEAD WITHOUT CONTRAST TECHNIQUE: Contiguous axial images were obtained from the base of the skull through the vertex without intravenous contrast. COMPARISON:  10/04/2017  FINDINGS: Brain: Normal anatomic configuration. Moderate subcortical and periventricular white matter changes are present, more prevalent within the a frontal subcortical white matter, stable since prior examination and likely the sequela of small vessel ischemia. Tiny remote infarcts are noted within the cerebellar hemispheres bilaterally. No abnormal intra or extra-axial mass lesion or fluid collection. No abnormal mass effect or midline shift. No evidence of acute intracranial hemorrhage or infarct. Ventricular size is normal. Cerebellum unremarkable. Vascular:  Unremarkable Skull: Intact Sinuses/Orbits: Paranasal sinuses are clear. Orbits are unremarkable. Other: Mastoid air cells and middle ear cavities are clear. IMPRESSION: 1. No acute intracranial abnormality. 2. Stable chronic small vessel ischemic changes. Electronically Signed   By: Helyn Numbers MD   On: 12/19/2019 16:58   CT CHEST WO CONTRAST  Result Date: 12/19/2019 CLINICAL DATA:  Altered mental status, fever, sepsis, respiratory distress EXAM: CT CHEST WITHOUT CONTRAST TECHNIQUE: Multidetector CT imaging of the chest was performed following the standard protocol without IV contrast. COMPARISON:  None. FINDINGS: Cardiovascular: Cardiac size within normal limits. Mild coronary artery calcification. No pericardial effusion. Central pulmonary arteries are of normal caliber. Thoracic aorta is age-appropriate with mild atherosclerotic calcification noted. No aortic aneurysm identified. Mediastinum/Nodes: No pathologic thoracic adenopathy. Visualized thyroid is unremarkable. Moderate hiatal hernia noted. Lungs/Pleura: There is nodular infiltrate within the posterior segment of the right upper lobe, right middle lobe, and aerated portions of the right lower lobe, in keeping with acute bronchopneumonia in the appropriate clinical setting. Superimposed bibasilar atelectasis, right greater than left. There is bronchial wall thickening, asymmetrically more  severe involving the right lung, in keeping with airway inflammation. No pneumothorax or pleural effusion. No central obstructing lesion. 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 Its patency is not well assessed on this noncontrast examination. Musculoskeletal: No chest wall mass or suspicious bone lesions identified. IMPRESSION: Right lung bronchopneumonia with superimposed bibasilar atelectasis. Aortic Atherosclerosis (ICD10-I70.0). Electronically Signed   By: Helyn Numbers MD   On: 12/19/2019 20:42   US RENAL  Result Date: 12/19/2019 CLINICAL DATA:  Acute kidney injury, urinary tract infection, sepsis. EXAM: RENAL / URINARY TRACT ULTRASOUND COMPLETE COMPARISON:  None. FINDINGS: Right Kidney: Renal measurements: 13.7 x 6.3 x 7.9 cm = volume: 355 mL. Echogenicity within normal limits. LEFT renal cysts, largest measuring 3.4 cm. No suspicious mass or hydronephrosis visualized. Left Kidney: Renal measurements: 12.7 x 5.9 x 5.8 cm = volume: 225 mL. Echogenicity within normal limits. No mass or hydronephrosis visualized. Bladder: Appears normal for degree of bladder distention. Other: None. IMPRESSION: 1. No acute findings. No hydronephrosis. 2. RIGHT renal cysts. Electronically Signed   By: Bary Richard M.D.   On: 12/19/2019 21:17   DG Chest Port 1 View  Result Date: 12/19/2019 CLINICAL DATA:  Bilateral lower leg swelling and erythema.  Sepsis. EXAM: PORTABLE CHEST 1 VIEW COMPARISON:  07/13/2019 FINDINGS: Poor inspiration. Grossly stable mild cardiomegaly. Interval curvilinear density in the right mid and lower lung zone. Small amount of ill-defined linear density at the medial left lung base without significant change. Positional dextroconvex scoliosis. IMPRESSION: 1. Interval curvilinear atelectasis in the right mid and lower lung zone. 2. Stable mild left basilar atelectasis or scarring.  Electronically Signed   By: Beckie Salts M.D.   On: 12/19/2019 17:09    Microbiology: Recent Results (from the past 240 hour(s))  SARS Coronavirus 2 by RT PCR (hospital order, performed in [facility] hospital lab) Nasopharyngeal Nasopharyngeal Swab     Status: None   Collection Time: 12/19/19  3:31 PM   Specimen: Nasopharyngeal Swab  Result Value Ref Range Status   SARS Coronavirus 2 NEGATIVE NEGATIVE Final    Comment: (NOTE) SARS-CoV-2 target nucleic acids are NOT DETECTED.  The SARS-CoV-2 RNA is generally detectable in upper and lower respiratory specimens during the acute phase of infection. The lowest concentration of SARS-CoV-2 viral  copies this assay can detect is 250 copies / mL. A negative result does not preclude SARS-CoV-2 infection and should not be used as the sole basis for treatment or other patient management decisions.  A negative result may occur with improper specimen collection / handling, submission of specimen other than nasopharyngeal swab, presence of viral mutation(s) within the areas targeted by this assay, and inadequate number of viral copies (<250 copies / mL). A negative result must be combined with clinical observations, patient history, and epidemiological information.  Fact Sheet for Patients:   BoilerBrush.com.cy  Fact Sheet for Healthcare Providers: https://pope.com/  This test is not yet approved or  cleared by the [place] FDA and has been authorized for detection and/or diagnosis of SARS-CoV-2 by FDA under an Emergency Use Authorization (EUA).  This EUA will remain in effect (meaning this test can be used) for the duration of the COVID-19 declaration under Section 564(b)(1) of the Act, 21 U.S.C. section 360bbb-3(b)(1), unless the authorization is terminated or revoked sooner.  Performed at [facility], [address], [place], [place] [address]   Urine culture      Status: Abnormal   Collection Time: 12/19/19  3:32 PM   Specimen: In/Out Cath Urine  Result Value Ref Range Status   Specimen Description   Final    IN/OUT CATH URINE Performed at [facility], [address] [place], [place] [address]    Special Requests   Final    NONE Performed at [facility], [address], [place], [place] [address]    Culture MULTIPLE SPECIES PRESENT, SUGGEST RECOLLECTION (A)  Final   Report Status 12/20/2019 FINAL  Final  Blood Culture (routine x 2)     Status: None (Preliminary result)   Collection Time: 12/19/19  4:12 PM   Specimen: BLOOD  Result Value Ref Range Status   Specimen Description   Final    BLOOD BLOOD LEFT FOREARM Performed at [facility], [address], [place], [place] [address]    Special Requests   Final    BOTTLES DRAWN AEROBIC AND ANAEROBIC Blood Culture adequate volume Performed at [facility], [address] [place], [place] [address]    Culture   Final    NO GROWTH 3 DAYS Performed at [facility], [address], [place], [place] [address]    Report Status PENDING  Incomplete  Blood Culture (routine x 2)     Status: None (Preliminary result)   Collection Time: 12/19/19  4:12 PM   Specimen: BLOOD  Result Value Ref Range Status   Specimen Description   Final    BLOOD RIGHT ANTECUBITAL Performed at [facility], [address], [place], [place] [address]    Special Requests   Final    BOTTLES DRAWN AEROBIC ONLY Blood Culture adequate volume Performed at [facility], [address], [place], [place] [address]    Culture   Final    NO GROWTH 3 DAYS Performed at [facility], [address], [place], [place] [address]    Report Status PENDING  Incomplete  MRSA PCR Screening     Status: None   Collection Time: 12/19/19 10:23 PM   Specimen: Nasal Mucosa; Nasopharyngeal  Result Value Ref Range  Status   MRSA by PCR NEGATIVE NEGATIVE Final    Comment:        The GeneXpert MRSA Assay (FDA approved for NASAL specimens  only), is one component of a comprehensive MRSA colonization surveillance program. It is not intended to diagnose MRSA infection nor to guide or monitor treatment for MRSA infections. Performed at [facility], [address], [place], [place] [address]      Labs: Basic Metabolic Panel: Recent Labs  Lab 12/19/19 1612 12/20/19 0323 12/21/19 0525 12/22/19 0522  NA 140 139 139 139  K 4.7 4.4 4.0 3.7  CL 106 106 109 106  CO2 26 21* 21* 20*  GLUCOSE 96 90 107* 100*  BUN 24* 18 16 14   CREATININE 1.73* 1.21* 0.95 0.78  CALCIUM 8.9 8.8* 8.7* 9.0   Liver Function Tests: Recent Labs  Lab 12/19/19 1612  AST 19  ALT 17  ALKPHOS 68  BILITOT 0.7  PROT 7.1  ALBUMIN 3.9   Recent Labs  Lab 12/19/19 1612  LIPASE 25   No results for input(s): AMMONIA in the last 168 hours. CBC: Recent Labs  Lab 12/19/19 1612 12/20/19 0323 12/21/19 0525 12/22/19 0522  WBC 18.6* 18.0* 11.7* 10.2  NEUTROABS 15.3*  --  7.9*  --   HGB 14.2 13.3 13.5 14.3  HCT 44.6 41.8 41.5 44.2  MCV 94.1 93.3 92.4 90.6  PLT 243 218 191 216   Cardiac Enzymes: No results for input(s): CKTOTAL, CKMB, CKMBINDEX, TROPONINI in the last 168 hours. BNP: BNP (last 3 results) Recent Labs    12/19/19 1533  BNP 118.3*    ProBNP (last 3 results) No results for input(s): PROBNP in the last 8760 hours.  CBG: No results for input(s): GLUCAP in the last 168 hours.     Signed:  02/18/20 MD   [facility] 12/22/2019, 11:37 AM

## 2019-12-24 LAB — CULTURE, BLOOD (ROUTINE X 2)
Culture: NO GROWTH
Culture: NO GROWTH
Special Requests: ADEQUATE
Special Requests: ADEQUATE

## 2020-01-28 ENCOUNTER — Emergency Department (HOSPITAL_BASED_OUTPATIENT_CLINIC_OR_DEPARTMENT_OTHER)
Admission: EM | Admit: 2020-01-28 | Discharge: 2020-01-28 | Disposition: A | Discharge status: Home / Self Care | Payer: Medicare Other | Attending: Emergency Medicine | Admitting: Emergency Medicine

## 2020-01-28 ENCOUNTER — Other Ambulatory Visit: Payer: Self-pay

## 2020-01-28 ENCOUNTER — Encounter (HOSPITAL_BASED_OUTPATIENT_CLINIC_OR_DEPARTMENT_OTHER): Payer: Self-pay | Admitting: Emergency Medicine

## 2020-01-28 DIAGNOSIS — I83028 Varicose veins of left lower extremity with ulcer other part of lower leg: Secondary | ICD-10-CM | POA: Diagnosis not present

## 2020-01-28 DIAGNOSIS — L97821 Non-pressure chronic ulcer of other part of left lower leg limited to breakdown of skin: Secondary | ICD-10-CM | POA: Insufficient documentation

## 2020-01-28 DIAGNOSIS — I10 Essential (primary) hypertension: Secondary | ICD-10-CM | POA: Diagnosis not present

## 2020-01-28 DIAGNOSIS — Z79899 Other long term (current) drug therapy: Secondary | ICD-10-CM | POA: Insufficient documentation

## 2020-01-28 DIAGNOSIS — I872 Venous insufficiency (chronic) (peripheral): Secondary | ICD-10-CM | POA: Insufficient documentation

## 2020-01-28 DIAGNOSIS — L97822 Non-pressure chronic ulcer of other part of left lower leg with fat layer exposed: Secondary | ICD-10-CM | POA: Diagnosis not present

## 2020-01-28 DIAGNOSIS — Z87891 Personal history of nicotine dependence: Secondary | ICD-10-CM | POA: Insufficient documentation

## 2020-01-28 NOTE — ED Triage Notes (Signed)
Pt has chronic wounds to left lower leg that opened and began bleeding. Pt has dressing in place on arrival.

## 2020-01-28 NOTE — ED Provider Notes (Signed)
[facility]Y [facility] Provider Note: Heidi Dell, MD, FACEP  CSN: [id number] MRN: [id number] ARRIVAL: 01/28/20 at 0559 ROOM: MH06/MH06   CHIEF COMPLAINT  Wound Check   HISTORY OF PRESENT ILLNESS  01/28/20 6:25 AM Heidi Green is a 64 y.o. female who has 3 chronic wounds to her anterior left leg.  They had been doing well but worsened recently and she has an appointment in a wound care clinic in 4 days.  She is here because she injured the lateral wound yesterday and it began bleeding.  The bleeding was profuse at times and she was not able to get it to stop until earlier this morning.  There is some associated pain but only when the wound is manipulated.  The wound is presently dressed with Xeroform gauze and Coban.   Past Medical History:  Diagnosis Date  . Arthritis    in back  . Back pain   . Chest pain 06/16/2012   negative cath with normal LV function  . Headache(784.0)   . Hypertension   . 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 Kidney stones   . Sleep apnea   . Stroke [facility])     Past Surgical History:  Procedure Laterality Date  . ABDOMINAL HYSTERECTOMY    . BACK SURGERY     x 3  . Left ACL repair  2004  . LEFT HEART CATHETERIZATION WITH CORONARY ANGIOGRAM N/A 06/16/2012   Procedure: LEFT HEART CATHETERIZATION WITH CORONARY ANGIOGRAM;  Surgeon: Peter M Swaziland, MD;  Location: [facility] CATH LAB;  Service: Cardiovascular;  Laterality: N/A;  . lipoma excision from neck  2006  . LITHOTRIPSY    . TUBAL LIGATION      Family History  Problem Relation Age of Onset  . Emphysema Mother   . Diabetes Mother     Social History   Tobacco Use  . Smoking status: Former Smoker    Packs/day: 0.50    Years: 26.00    Pack years: 13.00    Types: Cigarettes    Quit date: 06/17/2012    Years since quitting: 7.6  . Smokeless tobacco: Never Used  Substance Use Topics  . Alcohol use: No  . Drug use: No    Prior to Admission medications    Medication Sig Start Date End Date Taking? Authorizing Provider  albuterol (VENTOLIN HFA) 108 (90 Base) MCG/ACT inhaler Inhale 2 puffs into the lungs every 6 (six) hours as needed for wheezing or shortness of breath. 12/22/19   Rhetta Mura, MD  collagenase (SANTYL) ointment Apply 1 application topically daily. Apply to legs with each dressing change    [provider]  esomeprazole (NEXIUM) 40 MG capsule Take 40 mg by mouth daily with supper.  08/18/18   [provider]  ezetimibe (ZETIA) 10 MG tablet Take 10 mg by mouth daily with supper. 11/11/19   [provider]  FEROSUL 325 (65 Fe) MG tablet Take 325 mg by mouth 2 (two) times daily with a meal.  10/15/19   [provider]  HYDROcodone-acetaminophen (NORCO) 7.5-325 MG tablet Take 1 tablet by mouth 4 (four) times daily.     [provider]  levofloxacin (LEVAQUIN) 750 MG tablet Take 1 tablet (750 mg total) by mouth daily with supper. 12/22/19   Rhetta Mura, MD  mirabegron ER (MYRBETRIQ) 50 MG TB24 tablet Take 50 mg by mouth daily with supper.    [provider]  pregabalin (LYRICA) 150  MG capsule Take 150 mg by mouth 2 (two) times daily.    [provider]  rOPINIRole (REQUIP) 2 MG tablet Take 2 mg by mouth 2 (two) times daily.     [provider]  Vitamin D, Ergocalciferol, (DRISDOL) 50000 units CAPS capsule Take 50,000 Units by mouth every Sunday.  10/31/17   [provider]  zolpidem (AMBIEN) 10 MG tablet Take 5 mg by mouth at bedtime. 12/10/19   [provider]    Allergies Garamycin [gentamicin sulfate], Other, Penicillins, Norvasc [amlodipine besylate], Buprenorphine, Atorvastatin, and Rosuvastatin   REVIEW OF SYSTEMS  Negative except as noted here or in the History of Present Illness.   PHYSICAL EXAMINATION  Initial Vital Signs Blood pressure (!) 139/91, pulse 93, temperature 98.2 F (36.8 C), temperature source Oral, resp. rate (!)  22, height 5' (1.524 m), weight 121 kg, SpO2 91 %.  Examination General: Well-developed, well-nourished female in no acute distress; appearance consistent with age of record HENT: normocephalic; atraumatic Eyes: Normal appearance Neck: supple Heart: regular rate and rhythm Lungs: clear to auscultation bilaterally Abdomen: soft; nondistended; nontender; bowel sounds present Extremities: No deformity; full range of motion; chronic appearing stasis changes and 3 wounds of the anterior left lower leg, evidence of recent venous hemorrhage in the lateral wound:    Neurologic: Awake, alert and oriented; motor function intact in all extremities and symmetric; no facial droop Skin: Warm and dry Psychiatric: Normal mood and affect   RESULTS  Summary of this visit's results, reviewed and interpreted by myself:   EKG Interpretation  Date/Time:    Ventricular Rate:    PR Interval:    QRS Duration:   QT Interval:    QTC Calculation:   R Axis:     Text Interpretation:        Laboratory Studies: No results found for this or any previous visit (from the past 24 hour(s)). Imaging Studies: No results found.  ED COURSE and MDM  Nursing notes, initial and subsequent vitals signs, including pulse oximetry, reviewed and interpreted by myself.  Vitals:   01/28/20 0611 01/28/20 0613  BP:  (!) 139/91  Pulse:  93  Resp:  (!) 22  Temp:  98.2 F (36.8 C)  TempSrc:  Oral  SpO2:  91%  Weight: 121 kg   Height: 5' (1.524 m)    Medications - No data to display    PROCEDURES  Procedures  The existing dressings were removed.  Using sterile technique, the lateral wound was dressed with Quickclot and the 2 other wounds were dressed with Xeroform gauze.  These were then covered with sterile gauze and Coban.  The patient tolerated this well and there were no immediate complications.  ED DIAGNOSES     ICD-10-CM   1. Venous stasis ulcer of other part of left lower leg with fat layer exposed  with varicose veins (HCC)  I83.028    L97.822   2. Venous stasis ulcer of other part of left lower leg limited to breakdown of skin without varicose veins (HCC)  I87.2    E17.408        Paula Libra, MD 01/28/20 [phone]

## 2020-02-02 ENCOUNTER — Other Ambulatory Visit: Payer: Self-pay

## 2020-02-02 ENCOUNTER — Emergency Department (HOSPITAL_BASED_OUTPATIENT_CLINIC_OR_DEPARTMENT_OTHER): Payer: Medicare Other

## 2020-02-02 ENCOUNTER — Emergency Department (HOSPITAL_BASED_OUTPATIENT_CLINIC_OR_DEPARTMENT_OTHER)
Admission: EM | Admit: 2020-02-02 | Discharge: 2020-02-02 | Disposition: A | Discharge status: Home / Self Care | Payer: Medicare Other | Attending: Emergency Medicine | Admitting: Emergency Medicine

## 2020-02-02 DIAGNOSIS — Y92009 Unspecified place in unspecified non-institutional (private) residence as the place of occurrence of the external cause: Secondary | ICD-10-CM | POA: Diagnosis not present

## 2020-02-02 DIAGNOSIS — I10 Essential (primary) hypertension: Secondary | ICD-10-CM | POA: Insufficient documentation

## 2020-02-02 DIAGNOSIS — S99921A Unspecified injury of right foot, initial encounter: Secondary | ICD-10-CM | POA: Diagnosis present

## 2020-02-02 DIAGNOSIS — S91311A Laceration without foreign body, right foot, initial encounter: Secondary | ICD-10-CM | POA: Insufficient documentation

## 2020-02-02 DIAGNOSIS — W260XXA Contact with knife, initial encounter: Secondary | ICD-10-CM | POA: Diagnosis not present

## 2020-02-02 DIAGNOSIS — Z87891 Personal history of nicotine dependence: Secondary | ICD-10-CM | POA: Diagnosis not present

## 2020-02-02 MED ORDER — SULFAMETHOXAZOLE-TRIMETHOPRIM 800-160 MG PO TABS: 1.0000 | ORAL_TABLET | Freq: Two times a day (BID) | ORAL | 0 refills | Status: AC

## 2020-02-02 MED ORDER — LIDOCAINE-EPINEPHRINE 1 %-1:100000 IJ SOLN
10.0000 mL | Freq: Once | INTRAMUSCULAR | Status: AC
  Administered 2020-02-02: 10 mL
  Filled 2020-02-02: qty 1

## 2020-02-02 NOTE — ED Provider Notes (Signed)
MHP-EMERGENCY DEPT [facility] Emergency Department Provider Note MRN:  [id number]  Arrival date & time: 02/02/20     Chief Complaint   Laceration   History of Present Illness   Heidi Green is a 64 y.o. year-old female with a history of hypertension, stroke presenting to the ED with chief complaint of knife injury.  Patient was cutting some ribs when she accidentally dropped a knife onto her right foot.  Had a lot of trouble controlling the bleeding at home and at the urgent care, sent here for evaluation.  Denies any other injuries.  Explains that she has residual numbness and weakness to the right side of her body due to the stroke 3 years ago, no changes.  Did not experience any pain with the knife stabbing her in the foot.  Review of Systems  A complete 10 system review of systems was obtained and all systems are negative except as noted in the HPI and PMH.   Patient's Health History    Past Medical History:  Diagnosis Date  . Arthritis    in back  . Back pain   . Chest pain 06/16/2012   negative cath with normal LV function  . Headache(784.0)   . Hypertension   . 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 Kidney stones   . Sleep apnea   . Stroke [facility])     Past Surgical History:  Procedure Laterality Date  . ABDOMINAL HYSTERECTOMY    . BACK SURGERY     x 3  . Left ACL repair  2004  . LEFT HEART CATHETERIZATION WITH CORONARY ANGIOGRAM N/A 06/16/2012   Procedure: LEFT HEART CATHETERIZATION WITH CORONARY ANGIOGRAM;  Surgeon: Peter M Swaziland, MD;  Location[facility] CATH LAB;  Service: Cardiovascular;  Laterality: N/A;  . lipoma excision from neck  2006  . LITHOTRIPSY    . TUBAL LIGATION      Family History  Problem Relation Age of Onset  . Emphysema Mother   . Diabetes Mother     Social History   Socioeconomic History  . Marital status: Married    Spouse name: Brett Canales  . Number of children: 2  . Years of education: 12  .  Highest education level: Not on file  Occupational History    Comment: retired LPN  Tobacco Use  . Smoking status: Former Smoker    Packs/day: 0.50    Years: 26.00    Pack years: 13.00    Types: Cigarettes    Quit date: 06/17/2012    Years since quitting: 7.6  . Smokeless tobacco: Never Used  Substance and Sexual Activity  . Alcohol use: No  . Drug use: No  . Sexual activity: Not Currently  Other Topics Concern  . Not on file  Social History Narrative   Lives at home with husband   Social Determinants of Health   Financial Resource Strain:   . Difficulty of Paying Living Expenses: Not on file  Food Insecurity:   . Worried About Programme researcher, broadcasting/film/video in the Last Year: Not on file  . Ran Out of Food in the Last Year: Not on file  Transportation Needs:   . Lack of Transportation (Medical): Not on file  . Lack of Transportation (Non-Medical): Not on file  Physical Activity:   . Days of Exercise per Week: Not on file  . Minutes of Exercise per Session: Not on file  Stress:   . Feeling of  Stress : Not on file  Social Connections:   . Frequency of Communication with Friends and Family: Not on file  . Frequency of Social Gatherings with Friends and Family: Not on file  . Attends Religious Services: Not on file  . Active Member of Clubs or Organizations: Not on file  . Attends Banker Meetings: Not on file  . Marital Status: Not on file  Intimate Partner Violence:   . Fear of Current or Ex-Partner: Not on file  . Emotionally Abused: Not on file  . Physically Abused: Not on file  . Sexually Abused: Not on file     Physical Exam   Vitals:   02/02/20 1747 02/02/20 1950  BP: 128/80 122/78  Pulse:  88  Resp:  16  Temp:  98.2 F (36.8 C)  SpO2:  96%    CONSTITUTIONAL: Well-appearing, NAD NEURO:  Alert and oriented x 3, no focal deficits EYES:  eyes equal and reactive ENT/NECK:  no LAD, no JVD CARDIO: Regular rate, well-perfused, normal S1 and S2 PULM:  CTAB  no wheezing or rhonchi GI/GU:  normal bowel sounds, non-distended, non-tender MSK/SPINE:  No gross deformities, no edema SKIN: 3 cm linear laceration to the dorsal aspect of the right foot, hemostatic, wound running about 2 cm away from the dorsalis pedis artery in a parallel fashion PSYCH:  Appropriate speech and behavior  *Additional and/or pertinent findings included in MDM below  Diagnostic and Interventional Summary    EKG Interpretation  Date/Time:    Ventricular Rate:    PR Interval:    QRS Duration:   QT Interval:    QTC Calculation:   R Axis:     Text Interpretation:        Labs Reviewed - No data to display  DG Foot Complete Right  Final Result      Medications  lidocaine-EPINEPHrine (XYLOCAINE W/EPI) 1 %-1:100000 (with pres) injection 10 mL (10 mLs Other Given by Other 02/02/20 1951)     Procedures  /  Critical Care .Marland KitchenLaceration Repair  Date/Time: 02/02/2020 7:55 PM Performed by: Sabas Sous, MD Authorized by: Sabas Sous, MD   Consent:    Consent obtained:  Verbal   Consent given by:  Patient   Risks discussed:  Infection, need for additional repair, nerve damage, poor wound healing, poor cosmetic result, pain, retained foreign body, tendon damage and vascular damage Anesthesia (see MAR for exact dosages):    Anesthesia method:  None (Insensate foot from prior stroke.) Laceration details:    Location:  Foot   Foot location:  Top of R foot   Length (cm):  5   Depth (mm):  3 Repair type:    Repair type:  Intermediate Pre-procedure details:    Preparation:  Patient was prepped and draped in usual sterile fashion Exploration:    Hemostasis achieved with:  Direct pressure   Wound exploration: wound explored through full range of motion and entire depth of wound probed and visualized     Wound extent: fascia violated     Contaminated: no   Treatment:    Area cleansed with:  Saline   Amount of cleaning:  Standard Fascia repair:    Suture  size:  4-0   Suture material:  Vicryl   Suture technique:  Simple interrupted   Number of sutures:  3 Skin repair:    Repair method:  Staples   Number of staples:  8 Approximation:    Approximation:  Close Post-procedure details:  Dressing:  Antibiotic ointment and sterile dressing   Patient tolerance of procedure:  Tolerated well, no immediate complications    ED Course and Medical Decision Making  I have reviewed the triage vital signs, the nursing notes, and pertinent available records from the EMR.  Listed above are laboratory and imaging tests that I personally ordered, reviewed, and interpreted and then considered in my medical decision making (see below for details).  Laceration, will x-ray to exclude bony involvement and/or foreign body, will wash thoroughly and closed.  Given that the knife was contaminated with meat products, will cover with antibiotics.     X-ray normal, repaired as described above, appropriate for discharge.  Elmer Sow. Pilar Plate, MD [facility] Emergency Medicine [facility] [email or web address]  Final Clinical Impressions(s) / ED Diagnoses     ICD-10-CM   1. Laceration of right foot, initial encounter  S91.311A     ED Discharge Orders    None       Discharge Instructions Discussed with and Provided to Patient:     Discharge Instructions     You were evaluated in the Emergency Department and after careful evaluation, we did not find any emergent condition requiring admission or further testing in the hospital.  Your exam/testing today was overall reassuring.  We repaired your foot laceration here in the emergency department.  Internally we used absorbable sutures that do not need to be removed.  The staples on the outside will need to be removed in 10-14 days by a healthcare professional.  Please return to the Emergency Department if you experience any worsening of your condition.  Thank you for allowing [place] to be a part of  your care.        Sabas Sous, MD 02/02/20 [phone]

## 2020-02-02 NOTE — ED Triage Notes (Signed)
Pt c/o lac to right  Top foot by kitchen knife x 2 hrs ago

## 2020-02-02 NOTE — Discharge Instructions (Addendum)
You were evaluated in the Emergency Department and after careful evaluation, we did not find any emergent condition requiring admission or further testing in the hospital.  Your exam/testing today was overall reassuring.  We repaired your foot laceration here in the emergency department.  Internally we used absorbable sutures that do not need to be removed.  The staples on the outside will need to be removed in 10-14 days by a healthcare professional.  Please take the antibiotics as directed to prevent infection.  Please return to the Emergency Department if you experience any worsening of your condition.  Thank you for allowing [place] to be a part of your care.

## 2020-02-02 NOTE — ED Notes (Signed)
Discharge instructions and wound care discussed with patient. Verbalized understanding. Wheeled to lobby to wait for husband.

## 2020-02-11 ENCOUNTER — Other Ambulatory Visit: Payer: Self-pay

## 2020-02-11 ENCOUNTER — Emergency Department (HOSPITAL_BASED_OUTPATIENT_CLINIC_OR_DEPARTMENT_OTHER)
Admission: EM | Admit: 2020-02-11 | Discharge: 2020-02-11 | Disposition: A | Discharge status: Home / Self Care | Payer: Medicare Other | Attending: Emergency Medicine | Admitting: Emergency Medicine

## 2020-02-11 ENCOUNTER — Emergency Department (HOSPITAL_BASED_OUTPATIENT_CLINIC_OR_DEPARTMENT_OTHER): Payer: Medicare Other

## 2020-02-11 ENCOUNTER — Encounter (HOSPITAL_BASED_OUTPATIENT_CLINIC_OR_DEPARTMENT_OTHER): Payer: Self-pay | Admitting: Emergency Medicine

## 2020-02-11 DIAGNOSIS — Z79899 Other long term (current) drug therapy: Secondary | ICD-10-CM | POA: Diagnosis not present

## 2020-02-11 DIAGNOSIS — I10 Essential (primary) hypertension: Secondary | ICD-10-CM | POA: Insufficient documentation

## 2020-02-11 DIAGNOSIS — R4182 Altered mental status, unspecified: Secondary | ICD-10-CM | POA: Diagnosis not present

## 2020-02-11 DIAGNOSIS — N3000 Acute cystitis without hematuria: Secondary | ICD-10-CM | POA: Insufficient documentation

## 2020-02-11 DIAGNOSIS — Z87891 Personal history of nicotine dependence: Secondary | ICD-10-CM | POA: Diagnosis not present

## 2020-02-11 LAB — RAPID URINE DRUG SCREEN, HOSP PERFORMED
Amphetamines: NOT DETECTED
Barbiturates: NOT DETECTED
Benzodiazepines: NOT DETECTED
Cocaine: NOT DETECTED
Opiates: POSITIVE — AB
Tetrahydrocannabinol: NOT DETECTED

## 2020-02-11 LAB — CBC WITH DIFFERENTIAL/PLATELET
Abs Immature Granulocytes: 0.01 10*3/uL (ref 0.00–0.07)
Basophils Absolute: 0.1 10*3/uL (ref 0.0–0.1)
Basophils Relative: 1 %
Eosinophils Absolute: 0.1 10*3/uL (ref 0.0–0.5)
Eosinophils Relative: 2 %
HCT: 43.5 % (ref 36.0–46.0)
Hemoglobin: 14.2 g/dL (ref 12.0–15.0)
Immature Granulocytes: 0 %
Lymphocytes Relative: 36 %
Lymphs Abs: 2.6 10*3/uL (ref 0.7–4.0)
MCH: 29.2 pg (ref 26.0–34.0)
MCHC: 32.6 g/dL (ref 30.0–36.0)
MCV: 89.5 fL (ref 80.0–100.0)
Monocytes Absolute: 0.5 10*3/uL (ref 0.1–1.0)
Monocytes Relative: 7 %
Neutro Abs: 4 10*3/uL (ref 1.7–7.7)
Neutrophils Relative %: 54 %
Platelets: 232 10*3/uL (ref 150–400)
RBC: 4.86 MIL/uL (ref 3.87–5.11)
RDW: 14 % (ref 11.5–15.5)
WBC: 7.4 10*3/uL (ref 4.0–10.5)
nRBC: 0 % (ref 0.0–0.2)

## 2020-02-11 LAB — URINALYSIS, ROUTINE W REFLEX MICROSCOPIC
Bilirubin Urine: NEGATIVE
Glucose, UA: NEGATIVE mg/dL
Ketones, ur: NEGATIVE mg/dL
Leukocytes,Ua: NEGATIVE
Nitrite: POSITIVE — AB
Protein, ur: NEGATIVE mg/dL
Specific Gravity, Urine: <1.005 — ABNORMAL LOW (ref 1.005–1.030)
pH: 7 (ref 5.0–8.0)

## 2020-02-11 LAB — COMPREHENSIVE METABOLIC PANEL
ALT: 19 U/L (ref 0–44)
AST: 25 U/L (ref 15–41)
Albumin: 4.3 g/dL (ref 3.5–5.0)
Alkaline Phosphatase: 70 U/L (ref 38–126)
Anion gap: 13 (ref 5–15)
BUN: 16 mg/dL (ref 8–23)
CO2: 24 mmol/L (ref 22–32)
Calcium: 9.4 mg/dL (ref 8.9–10.3)
Chloride: 101 mmol/L (ref 98–111)
Creatinine, Ser: 1.07 mg/dL — ABNORMAL HIGH (ref 0.44–1.00)
GFR, Estimated: 58 mL/min — ABNORMAL LOW (ref >60)
Glucose, Bld: 98 mg/dL (ref 70–99)
Potassium: 4.4 mmol/L (ref 3.5–5.1)
Sodium: 138 mmol/L (ref 135–145)
Total Bilirubin: 0.8 mg/dL (ref 0.3–1.2)
Total Protein: 7.8 g/dL (ref 6.5–8.1)

## 2020-02-11 LAB — ETHANOL: Alcohol, Ethyl (B): <10 mg/dL (ref <10)

## 2020-02-11 LAB — URINALYSIS, MICROSCOPIC (REFLEX)

## 2020-02-11 LAB — AMMONIA: Ammonia: 17 umol/L (ref 9–35)

## 2020-02-11 MED ORDER — SODIUM CHLORIDE 0.9 % IV SOLN
1.0000 g | Freq: Once | INTRAVENOUS | Status: AC
  Administered 2020-02-11: 1 g via INTRAVENOUS
  Filled 2020-02-11: qty 10

## 2020-02-11 MED ORDER — CEPHALEXIN 500 MG PO CAPS: 500.0000 mg | ORAL_CAPSULE | Freq: Three times a day (TID) | ORAL | 0 refills | Status: AC

## 2020-02-11 NOTE — ED Notes (Signed)
EKG given to provider.  Now at the bedside.

## 2020-02-11 NOTE — ED Provider Notes (Signed)
[facility] EMERGENCY DEPARTMENT Provider Note  CSN: [id number] Arrival date & time: 02/11/20 1438    History Chief Complaint  Patient presents with  . Altered Mental Status    HPI  Heidi Green is a 64 y.o. female with history of multiple medical problems including remote hemorrhagic stroke brought to the ED by husband for evaluation of confusion he has noticed for the last 2 days. Husband provided much of the history; he describes vague issues with memory and word finding that are new for patient. No new falls or injuries. She  Has not had a fever, vomiting, CP or SOB. She was seen at Pulmonology office today for regularly scheduled follow up and had a neg CXR then. She has not had a change in her medications and is taking her pain medications as directed for chronic back pain. No prior liver disease. She has had infections before and has chronic LE wounds from venous stasis as well as a recent laceration to R foot. Patient is not able to give much useful history.    Past Medical History:  Diagnosis Date  . Arthritis    in back  . Back pain   . Chest pain 06/16/2012   negative cath with normal LV function  . Headache(784.0)   . Hypertension   . 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 Kidney stones   . Sleep apnea   . Stroke [facility])     Past Surgical History:  Procedure Laterality Date  . ABDOMINAL HYSTERECTOMY    . BACK SURGERY     x 3  . Left ACL repair  2004  . LEFT HEART CATHETERIZATION WITH CORONARY ANGIOGRAM N/A 06/16/2012   Procedure: LEFT HEART CATHETERIZATION WITH CORONARY ANGIOGRAM;  Surgeon: Peter M Swaziland, MD;  Location: [facility] CATH LAB;  Service: Cardiovascular;  Laterality: N/A;  . lipoma excision from neck  2006  . LITHOTRIPSY    . TUBAL LIGATION      Family History  Problem Relation Age of Onset  . Emphysema Mother   . Diabetes Mother     Social History   Tobacco Use  . Smoking status: Former Smoker     Packs/day: 0.50    Years: 26.00    Pack years: 13.00    Types: Cigarettes    Quit date: 06/17/2012    Years since quitting: 7.6  . Smokeless tobacco: Never Used  Substance Use Topics  . Alcohol use: No  . Drug use: No     Home Medications Prior to Admission medications   Medication Sig Start Date End Date Taking? Authorizing Provider  albuterol (VENTOLIN HFA) 108 (90 Base) MCG/ACT inhaler Inhale 2 puffs into the lungs every 6 (six) hours as needed for wheezing or shortness of breath. 12/22/19   Rhetta Mura, MD  collagenase (SANTYL) ointment Apply 1 application topically daily. Apply to legs with each dressing change    [provider]  esomeprazole (NEXIUM) 40 MG capsule Take 40 mg by mouth daily with supper.  08/18/18   [provider]  ezetimibe (ZETIA) 10 MG tablet Take 10 mg by mouth daily with supper. 11/11/19   [provider]  FEROSUL 325 (65 Fe) MG tablet Take 325 mg by mouth 2 (two) times daily with a meal.  10/15/19   [provider]  HYDROcodone-acetaminophen (NORCO) 7.5-325 MG tablet Take 1 tablet by mouth 4 (four) times daily.     [provider]  mirabegron  ER (MYRBETRIQ) 50 MG TB24 tablet Take 50 mg by mouth daily with supper.    [provider]  pregabalin (LYRICA) 150 MG capsule Take 150 mg by mouth 2 (two) times daily.    [provider]  rOPINIRole (REQUIP) 2 MG tablet Take 2 mg by mouth 2 (two) times daily.     [provider]  Vitamin D, Ergocalciferol, (DRISDOL) 50000 units CAPS capsule Take 50,000 Units by mouth every Sunday.  10/31/17   [provider]  zolpidem (AMBIEN) 10 MG tablet Take 5 mg by mouth at bedtime. 12/10/19   [provider]     Allergies    Garamycin [gentamicin sulfate], Other, Penicillins, Norvasc [amlodipine besylate], Buprenorphine, Atorvastatin, and Rosuvastatin   Review of Systems   Review of Systems Unable to assess due to mental status.     Physical Exam BP (!) 130/106 (BP Location: Left Arm)   Pulse 84   Temp 98.5 F (36.9 C) (Oral)   Resp 18   Ht 5\' 7"  (1.702 m)   Wt 121 kg   SpO2 96%   BMI 41.78 kg/m   Physical Exam Vitals and nursing note reviewed.  Constitutional:      Appearance: Normal appearance.  HENT:     Head: Normocephalic and atraumatic.     Nose: Nose normal.     Mouth/Throat:     Mouth: Mucous membranes are moist.  Eyes:     Extraocular Movements: Extraocular movements intact.     Conjunctiva/sclera: Conjunctivae normal.  Cardiovascular:     Rate and Rhythm: Normal rate.  Pulmonary:     Effort: Pulmonary effort is normal.     Breath sounds: Normal breath sounds.  Abdominal:     General: Abdomen is flat.     Palpations: Abdomen is soft.     Tenderness: There is no abdominal tenderness.  Musculoskeletal:        General: No swelling. Normal range of motion.     Cervical back: Neck supple.  Skin:    General: Skin is warm and dry.  Neurological:     General: No focal deficit present.     Mental Status: She is alert. She is disoriented.     Cranial Nerves: No cranial nerve deficit.     Comments: Oriented to name only  Psychiatric:        Mood and Affect: Mood normal.      ED Results / Procedures / Treatments   Labs (all labs ordered are listed, but only abnormal results are displayed) Labs Reviewed  CULTURE, BLOOD (ROUTINE X 2)  CULTURE, BLOOD (ROUTINE X 2)  URINE CULTURE  COMPREHENSIVE METABOLIC PANEL  ETHANOL  CBC WITH DIFFERENTIAL/PLATELET  URINALYSIS, ROUTINE W REFLEX MICROSCOPIC  RAPID URINE DRUG SCREEN, HOSP PERFORMED  AMMONIA    EKG None  Radiology No results found.  Procedures Procedures  Medications Ordered in the ED Medications - No data to display   MDM Rules/Calculators/A&P MDM Patient with AMS of unclear etiology, will check for infectious vs central nervous causes. Less likely medication overdose.  ED Course  I have reviewed the triage vital  signs and the nursing notes.  Pertinent labs & imaging results that were available during my care of the patient were reviewed by me and considered in my medical decision making (see chart for details).  Clinical Course as of Feb 11 2016  Fri Feb 11, 2020  1721 CBC and CMP are normal.    [CS]  1722 CT head without  acute finding.    [CS]  1816 Ammonia and EtOH normal.    [CS]  1822 Wound dressings taken down from legs. Chronic appearing wounds on L leg and recently repaired laceration on R foot without signs of secondary infection.   [CS]  1850 UA with signs of infection, maybe contributing to her confusion. Discussed admission vs outpatient treatment. The patient is adamant she does not want to be admitted. The husband is amenable to a trial of oral antibiotics at home and returning if not improving over the weekend.    [CS]  1852 Patient has remote reaction to PCN, but thinks she has had cephalosporins in the past without issue.    [CS]    Clinical Course User Index [CS] Pollyann Savoy, MD    Final Clinical Impression(s) / ED Diagnoses Final diagnoses:  None    Rx / DC Orders ED Discharge Orders    None       Pollyann Savoy, MD 02/11/20 2017

## 2020-02-11 NOTE — ED Notes (Signed)
Pt. Husband reports the Pt. Began with confusion on yesterday and has continued to be confused today.  Pt. Is oriented to self and still confused to place date and time.  Pt. Unaware of her birthdate. And unaware of the reason she is here.  Pt. Reports she feels pain in her back and has restless leg problems.

## 2020-02-11 NOTE — ED Triage Notes (Signed)
Per spouse AMS since yesterday.  Reports wounds to right foot and left leg as well as foul urine.

## 2020-02-11 NOTE — ED Notes (Signed)
Wounds have been unwrapped per Dr. Bernette Mayers order and Pt. Has staples in place in the R foot.  Pt. Has 3 areas of open sores on the L schin with noted wounds healing in nature.  Pt. Has noted edema in the R foot with positive pedal pulse bilat.

## 2020-02-14 LAB — URINE CULTURE: Culture: >=100000 — AB

## 2020-02-15 ENCOUNTER — Telehealth: Payer: Self-pay

## 2020-02-15 NOTE — Telephone Encounter (Signed)
Post ED Visit - Positive Culture Follow-up  Culture report reviewed by antimicrobial stewardship pharmacist: [facility] []  , Pharm.D. []  Enzo Bi, Pharm.D., BCPS AQ-ID []  , Pharm.D., BCPS []  Celedonio Miyamoto, Pharm.D., BCPS []  [place], Garvin Fila.D., BCPS, AAHIVP []  , Pharm.D., BCPS, AAHIVP []  Georgina Pillion, PharmD, BCPS []  , PharmD, BCPS []  [place], PharmD, BCPS []  [address], PharmD []  , PharmD, BCPS []  Estella Husk, PharmD [facility] []  Lysle Pearl, PharmD []  , PharmD []  Phillips Climes, PharmD []  , Rph []  Agapito Games) , PharmD []  Verlan Friends, PharmD []  , PharmD []  Mervyn Gay, PharmD []  , PharmD []  Vinnie Level, PharmD []  Autumn Patty, PharmD []  , PharmD []  Len Childs, PharmD   Positive urine culture Treated with Cephalexin, organism sensitive to the same and no further patient follow-up is required at this time.  02/15/2020, 9:55 AM

## 2020-02-16 LAB — CULTURE, BLOOD (ROUTINE X 2)
Culture: NO GROWTH
Culture: NO GROWTH
Special Requests: ADEQUATE

## 2020-05-15 ENCOUNTER — Encounter (HOSPITAL_BASED_OUTPATIENT_CLINIC_OR_DEPARTMENT_OTHER): Payer: Self-pay

## 2020-05-15 ENCOUNTER — Other Ambulatory Visit: Payer: Self-pay

## 2020-05-15 ENCOUNTER — Emergency Department (HOSPITAL_BASED_OUTPATIENT_CLINIC_OR_DEPARTMENT_OTHER): Payer: Medicare HMO

## 2020-05-15 ENCOUNTER — Emergency Department (HOSPITAL_BASED_OUTPATIENT_CLINIC_OR_DEPARTMENT_OTHER)
Admission: EM | Admit: 2020-05-15 | Discharge: 2020-05-15 | Disposition: A | Discharge status: Home / Self Care | Payer: Medicare HMO | Attending: Emergency Medicine | Admitting: Emergency Medicine

## 2020-05-15 DIAGNOSIS — S8992XA Unspecified injury of left lower leg, initial encounter: Secondary | ICD-10-CM | POA: Diagnosis not present

## 2020-05-15 DIAGNOSIS — I10 Essential (primary) hypertension: Secondary | ICD-10-CM | POA: Diagnosis not present

## 2020-05-15 DIAGNOSIS — Z79899 Other long term (current) drug therapy: Secondary | ICD-10-CM | POA: Insufficient documentation

## 2020-05-15 DIAGNOSIS — Z87891 Personal history of nicotine dependence: Secondary | ICD-10-CM | POA: Insufficient documentation

## 2020-05-15 DIAGNOSIS — M25462 Effusion, left knee: Secondary | ICD-10-CM | POA: Diagnosis not present

## 2020-05-15 DIAGNOSIS — W208XXA Other cause of strike by thrown, projected or falling object, initial encounter: Secondary | ICD-10-CM | POA: Insufficient documentation

## 2020-05-15 DIAGNOSIS — T1490XA Injury, unspecified, initial encounter: Secondary | ICD-10-CM

## 2020-05-15 MED ORDER — HYDROCODONE-ACETAMINOPHEN 5-325 MG PO TABS
1.0000 | ORAL_TABLET | Freq: Once | ORAL | Status: AC
  Administered 2020-05-15: 1 via ORAL
  Filled 2020-05-15: qty 1

## 2020-05-15 NOTE — Discharge Instructions (Addendum)
Please read and follow all provided instructions.  You have been seen today for knee injury  Tests performed today include: An x-ray of the affected area - does NOT show any broken bones or dislocations, but it does show severe osteoarthritis as we discussed. Vital signs. See below for your results today.   Home care instructions: -- *PRICE in the first 24-48 hours after injury: Protect (with brace, splint, sling), if given by your provider Rest Ice- Do not apply ice pack directly to your skin, place towel or similar between your skin and ice/ice pack. Apply ice for 20 min, then remove for 40 min while awake Compression- Wear brace, elastic bandage, splint as directed by your provider Elevate affected extremity above the level of your heart when not walking around for the first 24-48 hours   Use Ibuprofen (Motrin/Advil) 600mg  every 6 hours as needed for pain (do not exceed max dose in 24 hours, 2400mg )  Follow-up instructions: Please follow-up with your primary care provider or the provided orthopedic physician (bone specialist) if you continue to have significant pain in 1 week. In this case you may have a more severe injury that requires further care.   Return instructions:  Please return if your toes or feet are numb or tingling, appear gray or blue, or you have severe pain (also elevate the leg and loosen splint or wrap if you were given one) Please return to the Emergency Department if you experience worsening symptoms.  Please return if you have any other emergent concerns. Additional Information:  Your vital signs today were: BP 119/77 (BP Location: Left Arm)   Pulse 79   Temp 98.1 F (36.7 C) (Oral)   Resp 18   Ht 5\' 9"  (1.753 m)   Wt 117.9 kg   SpO2 92%   BMI 38.40 kg/m  If your blood pressure (BP) was elevated above 135/85 this visit, please have this repeated by your doctor within one month. ---------------

## 2020-05-15 NOTE — ED Provider Notes (Signed)
[facility] EMERGENCY DEPARTMENT Provider Note   CSN: [id number] Arrival date & time: 05/15/20  1124     History Chief Complaint  Patient presents with  . Knee Injury    Heidi Green is a 65 y.o. female with past medical history of arthritis, hypertension, stroke that presents emergency department today for left knee injury.  Patient states that yesterday Heidi Green dropped a 10 pound candle on her left knee.  Patient is wheelchair-bound, was trying to open a drawer and a candle fell out from the drawer onto her knee.  Patient denies any twisting movement or popping sensation of her knee.  States that her knee started swelling after this occurred.  Has not taken anything for this.  Patient states that Heidi Green does have severe arthritis of this knee.  States that knee was normal yesterday before this occurred.  Patient follows her PCP for her arthritis.  Does take Celebrex for this as needed.  Denies any fevers, chills, nausea vomiting.  Denies any abnormal gait.  Is wheelchair-bound due to spinal stenosis.  Husband is with her, who is primary caretaker.  Denies any numbness or tingling down into her leg.  Denies any hip pain.  Did not fall.  No other complaints.  HPI     Past Medical History:  Diagnosis Date  . Arthritis    in back  . Back pain   . Chest pain 06/16/2012   negative cath with normal LV function  . Headache(784.0)   . Hypertension   . 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 Kidney stones   . Sleep apnea   . Stroke [facility])     Patient Active Problem List   Diagnosis Date Noted  . Acute respiratory failure with hypoxia (HCC) 12/19/2019  . History of CVA with residual deficit 12/19/2019  . Severe sepsis with acute organ dysfunction (HCC) 12/19/2019  . Sepsis due to cellulitis (HCC) 08/19/2018  . Hypotension 08/19/2018  . Chronic low back pain 08/19/2018  . HCAP (healthcare-associated pneumonia) 11/11/2017  . Acute lower UTI  11/11/2017  . AKI (acute kidney injury) (HCC) 11/11/2017  . Lactic acid acidosis 11/11/2017  . Severe sepsis (HCC) 11/11/2017  . Community acquired bilateral lower lobe pneumonia 10/04/2017  . Hypokalemia 08/01/2016  . Class 2 obesity   . HTN (hypertension) 06/16/2012  . Dyslipidemia 06/16/2012  . Back pain 06/16/2012    Past Surgical History:  Procedure Laterality Date  . ABDOMINAL HYSTERECTOMY    . BACK SURGERY     x 3  . Left ACL repair  2004  . LEFT HEART CATHETERIZATION WITH CORONARY ANGIOGRAM N/A 06/16/2012   Procedure: LEFT HEART CATHETERIZATION WITH CORONARY ANGIOGRAM;  Surgeon: Peter M [place], MD;  Location: [facility] CATH LAB;  Service: Cardiovascular;  Laterality: N/A;  . lipoma excision from neck  2006  . LITHOTRIPSY    . TUBAL LIGATION       OB History   No obstetric history on file.     Family History  Problem Relation Age of Onset  . Emphysema Mother   . Diabetes Mother     Social History   Tobacco Use  . Smoking status: Former Smoker    Packs/day: 0.50    Years: 26.00    Pack years: 13.00    Types: Cigarettes    Quit date: 06/17/2012    Years since quitting: 7.9  . Smokeless tobacco: Never Used  Substance Use Topics  . Alcohol  use: No  . Drug use: No    Home Medications Prior to Admission medications   Medication Sig Start Date End Date Taking? Authorizing Provider  albuterol (VENTOLIN HFA) 108 (90 Base) MCG/ACT inhaler Inhale 2 puffs into the lungs every 6 (six) hours as needed for wheezing or shortness of breath. 12/22/19   Rhetta Mura, MD  collagenase (SANTYL) ointment Apply 1 application topically daily. Apply to legs with each dressing change    [provider]  diclofenac (VOLTAREN) 50 MG EC tablet Take 50 mg by mouth 2 (two) times daily. 01/23/20   [provider]  doxycycline (ADOXA) 100 MG tablet Take 100 mg by mouth 2 (two) times daily. 01/16/20   [provider]  esomeprazole (NEXIUM) 40 MG capsule Take 40 mg  by mouth daily with supper.  08/18/18   [provider]  ezetimibe (ZETIA) 10 MG tablet Take 10 mg by mouth daily with supper. 11/11/19   [provider]  FEROSUL 325 (65 Fe) MG tablet Take 325 mg by mouth 2 (two) times daily with a meal.  10/15/19   [provider]  HYDROcodone-acetaminophen (NORCO) 7.5-325 MG tablet Take 1 tablet by mouth 4 (four) times daily.     [provider]  lisinopril (ZESTRIL) 20 MG tablet Take 20 mg by mouth daily. 02/02/20   [provider]  lisinopril (ZESTRIL) 30 MG tablet Take 30 mg by mouth daily. 02/10/20   [provider]  mirabegron ER (MYRBETRIQ) 50 MG TB24 tablet Take 50 mg by mouth daily with supper.    [provider]  oxybutynin (DITROPAN-XL) 5 MG 24 hr tablet Take 5 mg by mouth daily. 01/08/20   [provider]  pregabalin (LYRICA) 150 MG capsule Take 150 mg by mouth 2 (two) times daily.    [provider]  rOPINIRole (REQUIP) 2 MG tablet Take 2 mg by mouth 2 (two) times daily.     [provider]  Vitamin D, Ergocalciferol, (DRISDOL) 50000 units CAPS capsule Take 50,000 Units by mouth every Sunday.  10/31/17   [provider]  zolpidem (AMBIEN) 10 MG tablet Take 5 mg by mouth at bedtime. 12/10/19   [provider]    Allergies    Buprenorphine, Garamycin [gentamicin sulfate], Other, Penicillins, Atorvastatin, Norvasc [amlodipine besylate], and Rosuvastatin  Review of Systems   Review of Systems  Constitutional: Negative for diaphoresis, fatigue and fever.  Eyes: Negative for visual disturbance.  Respiratory: Negative for shortness of breath.   Cardiovascular: Negative for chest pain.  Gastrointestinal: Negative for nausea and vomiting.  Musculoskeletal: Positive for arthralgias. Negative for back pain and myalgias.  Skin: Negative for color change, pallor, rash and wound.  Neurological: Negative for syncope, weakness, light-headedness, numbness and  headaches.  Psychiatric/Behavioral: Negative for behavioral problems and confusion.    Physical Exam Updated Vital Signs BP 119/77 (BP Location: Left Arm)   Pulse 79   Temp 98.1 F (36.7 C) (Oral)   Resp 18   Ht 5\' 9"  (1.753 m)   Wt 117.9 kg   SpO2 92%   BMI 38.40 kg/m   Physical Exam Constitutional:      General: Heidi Green is not in acute distress.    Appearance: Normal appearance. Heidi Green is not ill-appearing, toxic-appearing or diaphoretic.  HENT:     Head: Normocephalic and atraumatic.  Eyes:     Extraocular Movements: Extraocular movements intact.     Pupils: Pupils are equal, round, and reactive to light.  Cardiovascular:  Rate and Rhythm: Normal rate and regular rhythm.     Pulses: Normal pulses.  Pulmonary:     Effort: Pulmonary effort is normal.     Breath sounds: Normal breath sounds.  Musculoskeletal:        General: Normal range of motion.     Cervical back: Normal range of motion.     Right knee: Normal.       Legs:     Comments: Patient with moderate suprapatellar effusion with mild warmth throughout.  Knee is not hot.  No erythema.  No rashes noted.  Patient without any bony tenderness.  Patient is able to range knee in all directions, no decreased range of motion.  Normal laxity test.  Distal PT pulses equal and intact bilaterally.    Skin:    General: Skin is warm and dry.     Capillary Refill: Capillary refill takes less than 2 seconds.  Neurological:     General: No focal deficit present.     Mental Status: Heidi Green is alert and oriented to person, place, and time.  Psychiatric:        Mood and Affect: Mood normal.        Behavior: Behavior normal.        Thought Content: Thought content normal.     ED Results / Procedures / Treatments   Labs (all labs ordered are listed, but only abnormal results are displayed) Labs Reviewed - No data to display  EKG None  Radiology DG Knee Left Port  Result Date: 05/15/2020 CLINICAL DATA:  LEFT knee swelling,  limited range of motion EXAM: PORTABLE LEFT KNEE - 1-2 VIEW COMPARISON:  12/19/2019 FINDINGS: Osseous demineralization. Tricompartmental osteoarthritic changes with joint space narrowing and spur formation. No acute fracture, dislocation, or bone destruction. Moderate-sized joint effusion. Scattered vascular calcifications. IMPRESSION: Osseous demineralization with tricompartmental osteoarthritic changes and moderate joint effusion. No acute fracture or dislocation. Electronically Signed   By: Ulyses Southward M.D.   On: 05/15/2020 13:10    Procedures Procedures   Medications Ordered in ED Medications  HYDROcodone-acetaminophen (NORCO/VICODIN) 5-325 MG per tablet 1 tablet (1 tablet Oral Given 05/15/20 1403)    ED Course  I have reviewed the triage vital signs and the nursing notes.  Pertinent labs & imaging results that were available during my care of the patient were reviewed by me and considered in my medical decision making (see chart for details).    MDM Rules/Calculators/A&P                          REBBECCA TOBER is a 65 y.o. female with past medical history of arthritis, hypertension, stroke that presents emergency department today for left knee injury.  Plain films do show severe arthritis with knee effusion.  Patient with normal laxity tests, normal range of motion to knee.  Patient is distally neurovascularly intact.  Effusion is traumatic, symptomatic treatment discussed, patient to follow-up with PCP.  Will wrap here today and provide knee sleeve.  Knee brace not realistic since patient is in wheelchair.  Norco given for pain, patient states that Heidi Green normally takes this at home for her back.  No red flag symptoms.  No concerns for septic joint or ligamental tear.  Patient to be discharged at this time.  Doubt need for further emergent work up at this time. I explained the diagnosis and have given explicit precautions to return to the ER including for any other new or  worsening symptoms. The  patient understands and accepts the medical plan as it's been dictated and I have answered their questions. Discharge instructions concerning home care and prescriptions have been given. The patient is STABLE and is discharged to home in good condition.  I discussed this case with my attending physician who cosigned this note including patient's presenting symptoms, physical exam, and planned diagnostics and interventions. Attending physician stated agreement with plan or made changes to plan which were implemented.    Final Clinical Impression(s) / ED Diagnoses Final diagnoses:  Injury  Injury of left knee, initial encounter    Rx / DC Orders ED Discharge Orders    None       Farrel Gordon, PA-C 05/15/20 1413    Terrilee Files, MD 05/15/20 [phone]

## 2020-05-15 NOTE — ED Triage Notes (Signed)
Pt states she dropped a 10lb candle on her left knee yesterday-NAD-to triage n w/c

## 2020-06-02 ENCOUNTER — Inpatient Hospital Stay (HOSPITAL_COMMUNITY)
Admission: EM | Admit: 2020-06-02 | Discharge: 2020-06-05 | DRG: 493 | Disposition: A | Discharge status: Home Health | Payer: Medicare HMO | Attending: Internal Medicine | Admitting: Internal Medicine

## 2020-06-02 ENCOUNTER — Other Ambulatory Visit: Payer: Self-pay

## 2020-06-02 ENCOUNTER — Encounter (HOSPITAL_COMMUNITY): Payer: Self-pay | Admitting: Emergency Medicine

## 2020-06-02 ENCOUNTER — Emergency Department (HOSPITAL_COMMUNITY): Payer: Medicare HMO

## 2020-06-02 DIAGNOSIS — Z993 Dependence on wheelchair: Secondary | ICD-10-CM

## 2020-06-02 DIAGNOSIS — S82201P Unspecified fracture of shaft of right tibia, subsequent encounter for closed fracture with malunion: Secondary | ICD-10-CM | POA: Diagnosis not present

## 2020-06-02 DIAGNOSIS — S82209A Unspecified fracture of shaft of unspecified tibia, initial encounter for closed fracture: Secondary | ICD-10-CM

## 2020-06-02 DIAGNOSIS — Y92013 Bedroom of single-family (private) house as the place of occurrence of the external cause: Secondary | ICD-10-CM | POA: Diagnosis not present

## 2020-06-02 DIAGNOSIS — D72829 Elevated white blood cell count, unspecified: Secondary | ICD-10-CM

## 2020-06-02 DIAGNOSIS — Z888 Allergy status to other drugs, medicaments and biological substances status: Secondary | ICD-10-CM | POA: Diagnosis not present

## 2020-06-02 DIAGNOSIS — Z09 Encounter for follow-up examination after completed treatment for conditions other than malignant neoplasm: Secondary | ICD-10-CM

## 2020-06-02 DIAGNOSIS — M479 Spondylosis, unspecified: Secondary | ICD-10-CM | POA: Diagnosis present

## 2020-06-02 DIAGNOSIS — K219 Gastro-esophageal reflux disease without esophagitis: Secondary | ICD-10-CM | POA: Diagnosis present

## 2020-06-02 DIAGNOSIS — E538 Deficiency of other specified B group vitamins: Secondary | ICD-10-CM | POA: Diagnosis present

## 2020-06-02 DIAGNOSIS — Z6838 Body mass index (BMI) 38.0-38.9, adult: Secondary | ICD-10-CM | POA: Diagnosis not present

## 2020-06-02 DIAGNOSIS — G4733 Obstructive sleep apnea (adult) (pediatric): Secondary | ICD-10-CM | POA: Diagnosis present

## 2020-06-02 DIAGNOSIS — Z87891 Personal history of nicotine dependence: Secondary | ICD-10-CM | POA: Diagnosis not present

## 2020-06-02 DIAGNOSIS — Z8673 Personal history of transient ischemic attack (TIA), and cerebral infarction without residual deficits: Secondary | ICD-10-CM | POA: Diagnosis not present

## 2020-06-02 DIAGNOSIS — I6930 Unspecified sequelae of cerebral infarction: Secondary | ICD-10-CM

## 2020-06-02 DIAGNOSIS — G894 Chronic pain syndrome: Secondary | ICD-10-CM | POA: Diagnosis present

## 2020-06-02 DIAGNOSIS — E785 Hyperlipidemia, unspecified: Secondary | ICD-10-CM | POA: Diagnosis present

## 2020-06-02 DIAGNOSIS — S82831A Other fracture of upper and lower end of right fibula, initial encounter for closed fracture: Secondary | ICD-10-CM

## 2020-06-02 DIAGNOSIS — E876 Hypokalemia: Secondary | ICD-10-CM | POA: Diagnosis not present

## 2020-06-02 DIAGNOSIS — S82451A Displaced comminuted fracture of shaft of right fibula, initial encounter for closed fracture: Secondary | ICD-10-CM | POA: Diagnosis present

## 2020-06-02 DIAGNOSIS — W1811XA Fall from or off toilet without subsequent striking against object, initial encounter: Secondary | ICD-10-CM | POA: Diagnosis present

## 2020-06-02 DIAGNOSIS — E669 Obesity, unspecified: Secondary | ICD-10-CM | POA: Diagnosis present

## 2020-06-02 DIAGNOSIS — S82251A Displaced comminuted fracture of shaft of right tibia, initial encounter for closed fracture: Principal | ICD-10-CM | POA: Diagnosis present

## 2020-06-02 DIAGNOSIS — Z79899 Other long term (current) drug therapy: Secondary | ICD-10-CM

## 2020-06-02 DIAGNOSIS — Z20822 Contact with and (suspected) exposure to covid-19: Secondary | ICD-10-CM | POA: Diagnosis present

## 2020-06-02 DIAGNOSIS — Z825 Family history of asthma and other chronic lower respiratory diseases: Secondary | ICD-10-CM | POA: Diagnosis not present

## 2020-06-02 DIAGNOSIS — D62 Acute posthemorrhagic anemia: Secondary | ICD-10-CM | POA: Diagnosis not present

## 2020-06-02 DIAGNOSIS — Z880 Allergy status to penicillin: Secondary | ICD-10-CM | POA: Diagnosis not present

## 2020-06-02 DIAGNOSIS — I10 Essential (primary) hypertension: Secondary | ICD-10-CM | POA: Diagnosis present

## 2020-06-02 DIAGNOSIS — S82201A Unspecified fracture of shaft of right tibia, initial encounter for closed fracture: Secondary | ICD-10-CM | POA: Diagnosis not present

## 2020-06-02 DIAGNOSIS — S82401A Unspecified fracture of shaft of right fibula, initial encounter for closed fracture: Secondary | ICD-10-CM | POA: Diagnosis not present

## 2020-06-02 DIAGNOSIS — Z833 Family history of diabetes mellitus: Secondary | ICD-10-CM

## 2020-06-02 DIAGNOSIS — S82401P Unspecified fracture of shaft of right fibula, subsequent encounter for closed fracture with malunion: Secondary | ICD-10-CM | POA: Diagnosis not present

## 2020-06-02 DIAGNOSIS — S82101A Unspecified fracture of upper end of right tibia, initial encounter for closed fracture: Secondary | ICD-10-CM

## 2020-06-02 DIAGNOSIS — W19XXXA Unspecified fall, initial encounter: Secondary | ICD-10-CM

## 2020-06-02 DIAGNOSIS — S82409A Unspecified fracture of shaft of unspecified fibula, initial encounter for closed fracture: Secondary | ICD-10-CM

## 2020-06-02 LAB — CBC
HCT: 38.3 % (ref 36.0–46.0)
Hemoglobin: 12.3 g/dL (ref 12.0–15.0)
MCH: 29.4 pg (ref 26.0–34.0)
MCHC: 32.1 g/dL (ref 30.0–36.0)
MCV: 91.6 fL (ref 80.0–100.0)
Platelets: 290 10*3/uL (ref 150–400)
RBC: 4.18 MIL/uL (ref 3.87–5.11)
RDW: 14.9 % (ref 11.5–15.5)
WBC: 12.5 10*3/uL — ABNORMAL HIGH (ref 4.0–10.5)
nRBC: 0 % (ref 0.0–0.2)

## 2020-06-02 LAB — BASIC METABOLIC PANEL
Anion gap: 8 (ref 5–15)
BUN: 14 mg/dL (ref 8–23)
CO2: 27 mmol/L (ref 22–32)
Calcium: 8.6 mg/dL — ABNORMAL LOW (ref 8.9–10.3)
Chloride: 105 mmol/L (ref 98–111)
Creatinine, Ser: 0.64 mg/dL (ref 0.44–1.00)
GFR, Estimated: >60 mL/min (ref >60)
Glucose, Bld: 104 mg/dL — ABNORMAL HIGH (ref 70–99)
Potassium: 3.1 mmol/L — ABNORMAL LOW (ref 3.5–5.1)
Sodium: 140 mmol/L (ref 135–145)

## 2020-06-02 LAB — RESP PANEL BY RT-PCR (FLU A&B, COVID) ARPGX2
Influenza A by PCR: NEGATIVE
Influenza B by PCR: NEGATIVE
SARS Coronavirus 2 by RT PCR: NEGATIVE

## 2020-06-02 MED ORDER — HYDROCODONE-ACETAMINOPHEN 10-325 MG PO TABS: 1.0000 | ORAL_TABLET | Freq: Four times a day (QID) | ORAL | Status: DC | PRN

## 2020-06-02 MED ORDER — ROPINIROLE HCL 1 MG PO TABS
2.0000 mg | ORAL_TABLET | Freq: Two times a day (BID) | ORAL | Status: DC
  Administered 2020-06-02 – 2020-06-05 (×8): 2 mg via ORAL
  Filled 2020-06-02 (×9): qty 2

## 2020-06-02 MED ORDER — PREGABALIN 75 MG PO CAPS
150.0000 mg | ORAL_CAPSULE | Freq: Two times a day (BID) | ORAL | Status: DC
  Administered 2020-06-02 – 2020-06-05 (×7): 150 mg via ORAL
  Filled 2020-06-02 (×7): qty 2

## 2020-06-02 MED ORDER — FENTANYL CITRATE (PF) 100 MCG/2ML IJ SOLN: 50.0000 ug | Freq: Once | INTRAMUSCULAR | Status: DC

## 2020-06-02 MED ORDER — FERROUS SULFATE 325 (65 FE) MG PO TABS
325.0000 mg | ORAL_TABLET | Freq: Two times a day (BID) | ORAL | Status: DC
Start: 2020-06-03 — End: 2020-06-06
  Administered 2020-06-03 – 2020-06-05 (×3): 325 mg via ORAL
  Filled 2020-06-02 (×4): qty 1

## 2020-06-02 MED ORDER — PANTOPRAZOLE SODIUM 40 MG PO TBEC
40.0000 mg | DELAYED_RELEASE_TABLET | Freq: Every day | ORAL | Status: DC
  Administered 2020-06-03 – 2020-06-05 (×3): 40 mg via ORAL
  Filled 2020-06-02 (×3): qty 1

## 2020-06-02 MED ORDER — EZETIMIBE 10 MG PO TABS
10.0000 mg | ORAL_TABLET | Freq: Every day | ORAL | Status: DC
Start: 2020-06-03 — End: 2020-06-06
  Administered 2020-06-03 – 2020-06-05 (×3): 10 mg via ORAL
  Filled 2020-06-02 (×3): qty 1

## 2020-06-02 MED ORDER — POTASSIUM CHLORIDE CRYS ER 20 MEQ PO TBCR
40.0000 meq | EXTENDED_RELEASE_TABLET | Freq: Once | ORAL | Status: AC
  Administered 2020-06-02: 40 meq via ORAL
  Filled 2020-06-02: qty 2

## 2020-06-02 MED ORDER — ACETAMINOPHEN 325 MG PO TABS
650.0000 mg | ORAL_TABLET | Freq: Four times a day (QID) | ORAL | Status: DC | PRN
  Administered 2020-06-04 – 2020-06-05 (×2): 650 mg via ORAL
  Filled 2020-06-02 (×2): qty 2

## 2020-06-02 MED ORDER — ZOLPIDEM TARTRATE 5 MG PO TABS
5.0000 mg | ORAL_TABLET | Freq: Every day | ORAL | Status: DC
  Administered 2020-06-02 – 2020-06-04 (×3): 5 mg via ORAL
  Filled 2020-06-02 (×3): qty 1

## 2020-06-02 MED ORDER — FENTANYL CITRATE (PF) 100 MCG/2ML IJ SOLN: 25.0000 ug | INTRAMUSCULAR | Status: DC | PRN

## 2020-06-02 MED ORDER — FENTANYL CITRATE (PF) 100 MCG/2ML IJ SOLN
25.0000 ug | INTRAMUSCULAR | Status: DC | PRN
  Administered 2020-06-03 – 2020-06-05 (×7): 25 ug via INTRAVENOUS
  Filled 2020-06-02 (×7): qty 2

## 2020-06-02 MED ORDER — OXYBUTYNIN CHLORIDE ER 5 MG PO TB24
5.0000 mg | ORAL_TABLET | Freq: Every day | ORAL | Status: DC
  Administered 2020-06-03 – 2020-06-05 (×3): 5 mg via ORAL
  Filled 2020-06-02 (×3): qty 1

## 2020-06-02 MED ORDER — HYDROCODONE-ACETAMINOPHEN 10-325 MG PO TABS
1.0000 | ORAL_TABLET | Freq: Four times a day (QID) | ORAL | Status: DC | PRN
Start: 2020-06-02 — End: 2020-06-06
  Administered 2020-06-02 – 2020-06-05 (×8): 1 via ORAL
  Filled 2020-06-02 (×9): qty 1

## 2020-06-02 MED ORDER — ACETAMINOPHEN 650 MG RE SUPP: 650.0000 mg | Freq: Four times a day (QID) | RECTAL | Status: DC | PRN

## 2020-06-02 MED ORDER — LISINOPRIL 20 MG PO TABS
30.0000 mg | ORAL_TABLET | Freq: Every day | ORAL | Status: DC
  Administered 2020-06-03: 30 mg via ORAL
  Filled 2020-06-02 (×2): qty 1

## 2020-06-02 MED ORDER — FENTANYL CITRATE (PF) 100 MCG/2ML IJ SOLN
50.0000 ug | Freq: Once | INTRAMUSCULAR | Status: AC
  Administered 2020-06-02: 50 ug via INTRAMUSCULAR
  Filled 2020-06-02: qty 2

## 2020-06-02 NOTE — ED Notes (Signed)
Patient transported to X-ray 

## 2020-06-02 NOTE — H&P (Signed)
History and Physical    Heidi Green XBD:[id number] DOB: November 08, 1955 DOA: 06/02/2020  PCP: Chiquita Loth, PA Patient coming from: PCPs office  Chief Complaint: Right tib-fib fracture  HPI: Heidi Green is a 65 y.o. female with medical history significant of intracerebral hemorrhage in 2018, hypertension, hyperlipidemia, chronic lower extremity wounds, OSA on CPAP, chronic pain syndrome, obesity (BMI 38.40) presenting to the ED from her PCPs office with a reported right tib-fib fracture.  History provided by patient and her husband.  She is wheelchair-bound at baseline.  States on Sunday while trying to get up from her bedside commode, she lost her balance and fell and since then experiencing pain in her right leg just below the knee.  She is not on any blood thinners.  She is fully vaccinated against COVID including booster shot.  Denies any headaches, neck pain, fevers, chills, cough, shortness of breath, chest pain, nausea, or vomiting.  ED Course: Vital signs stable.  CBC, BMP, and screening Covid test pending.    X-ray of right tibia/fibula showing comminuted minimally displaced fracture of the proximal right tibial metadiaphyseal junction.  Comminuted proximal fibular head fracture with nondisplaced distal fibular diaphyseal fracture.  Widening of the ankle mortise laterally, may reflect ligamentous injury.  Diffuse soft tissue edema.  Orthopedics consulted and recommended additional imaging of the right ankle and knee (x-rays pending).  Recommended long-leg splint which was applied in the ED.  Planning for surgery in the morning.  Patient was given fentanyl for pain.  Review of Systems:  All systems reviewed and apart from history of presenting illness, are negative.  Past Medical History:  Diagnosis Date  . Arthritis    in back  . Back pain   . Chest pain 06/16/2012   negative cath with normal LV function  . Headache(784.0)   . Hypertension   . ICH (intracerebral hemorrhage)  (HCC) - L basal ganglie d/t the HTN 07/24/2016  . Kidney stones   . Sleep apnea   . Stroke [facility])     Past Surgical History:  Procedure Laterality Date  . ABDOMINAL HYSTERECTOMY    . BACK SURGERY     x 3  . Left ACL repair  2004  . LEFT HEART CATHETERIZATION WITH CORONARY ANGIOGRAM N/A 06/16/2012   Procedure: LEFT HEART CATHETERIZATION WITH CORONARY ANGIOGRAM;  Surgeon: Peter M Swaziland, MD;  Location: [facility] CATH LAB;  Service: Cardiovascular;  Laterality: N/A;  . lipoma excision from neck  2006  . LITHOTRIPSY    . TUBAL LIGATION       reports that she quit smoking about 7 years ago. Her smoking use included cigarettes. She has a 13.00 pack-year smoking history. She has never used smokeless tobacco. She reports that she does not drink alcohol and does not use drugs.  Allergies  Allergen Reactions  . Buprenorphine Itching  . Garamycin [Gentamicin Sulfate] Hives and Shortness Of Breath  . Other Anaphylaxis    Reaction to HORSE SERUM  . Penicillins Anaphylaxis, Itching and Swelling    Swelling and shortness of breath after taking penicillin for a tooth infection when patient was 24. Per her account, she believes that she has taken amoxicillin and cephalosporins since that time and had no issues.   . Atorvastatin Nausea And Vomiting  . Norvasc [Amlodipine Besylate] Nausea And Vomiting  . Rosuvastatin Diarrhea and Other (See Comments)    Spasms in hand     Family History  Problem Relation Age of Onset  . Emphysema Mother   .  Diabetes Mother     Prior to Admission medications   Medication Sig Start Date End Date Taking? Authorizing Provider  albuterol (VENTOLIN HFA) 108 (90 Base) MCG/ACT inhaler Inhale 2 puffs into the lungs every 6 (six) hours as needed for wheezing or shortness of breath. 12/22/19   Rhetta Mura, MD  collagenase (SANTYL) ointment Apply 1 application topically daily. Apply to legs with each dressing change    [provider]  diclofenac (VOLTAREN) 50 MG  EC tablet Take 50 mg by mouth 2 (two) times daily. 01/23/20   [provider]  doxycycline (ADOXA) 100 MG tablet Take 100 mg by mouth 2 (two) times daily. 01/16/20   [provider]  esomeprazole (NEXIUM) 40 MG capsule Take 40 mg by mouth daily with supper.  08/18/18   [provider]  ezetimibe (ZETIA) 10 MG tablet Take 10 mg by mouth daily with supper. 11/11/19   [provider]  FEROSUL 325 (65 Fe) MG tablet Take 325 mg by mouth 2 (two) times daily with a meal.  10/15/19   [provider]  HYDROcodone-acetaminophen (NORCO) 7.5-325 MG tablet Take 1 tablet by mouth 4 (four) times daily.     [provider]  lisinopril (ZESTRIL) 20 MG tablet Take 20 mg by mouth daily. 02/02/20   [provider]  lisinopril (ZESTRIL) 30 MG tablet Take 30 mg by mouth daily. 02/10/20   [provider]  mirabegron ER (MYRBETRIQ) 50 MG TB24 tablet Take 50 mg by mouth daily with supper.    [provider]  oxybutynin (DITROPAN-XL) 5 MG 24 hr tablet Take 5 mg by mouth daily. 01/08/20   [provider]  pregabalin (LYRICA) 150 MG capsule Take 150 mg by mouth 2 (two) times daily.    [provider]  rOPINIRole (REQUIP) 2 MG tablet Take 2 mg by mouth 2 (two) times daily.     [provider]  Vitamin D, Ergocalciferol, (DRISDOL) 50000 units CAPS capsule Take 50,000 Units by mouth every Sunday.  10/31/17   [provider]  zolpidem (AMBIEN) 10 MG tablet Take 5 mg by mouth at bedtime. 12/10/19   [provider]    Physical Exam: Vitals:   06/02/20 1926 06/02/20 1930 06/02/20 2005 06/02/20 2030  BP: (!) 151/90 (!) 154/84 (!) 145/92 (!) 150/85  Pulse: 80 84 69 73  Resp: 18 15 17 19   Temp: 97.9 F (36.6 C)     TempSrc: Oral     SpO2: 96% 97% 96% 95%  Weight:      Height:        Physical Exam Constitutional:      General: She is not in acute distress. HENT:     Head: Normocephalic and atraumatic.   Eyes:     Extraocular Movements: Extraocular movements intact.     Conjunctiva/sclera: Conjunctivae normal.  Cardiovascular:     Rate and Rhythm: Normal rate and regular rhythm.     Pulses: Normal pulses.  Pulmonary:     Effort: Pulmonary effort is normal. No respiratory distress.     Breath sounds: Normal breath sounds. No wheezing or rales.  Abdominal:     General: Bowel sounds are normal. There is no distension.     Palpations: Abdomen is soft.     Tenderness: There is no abdominal tenderness.  Musculoskeletal:     Cervical back: Normal range of motion and neck supple.     Comments: Right lower extremity: Long-leg splint in place  Skin:  General: Skin is warm and dry.  Neurological:     General: No focal deficit present.     Mental Status: She is alert and oriented to person, place, and time.     Labs on Admission: I have personally reviewed following labs and imaging studies  CBC: No results for input(s): WBC, NEUTROABS, HGB, HCT, MCV, PLT in the last 168 hours. Basic Metabolic Panel: No results for input(s): NA, K, CL, CO2, GLUCOSE, BUN, CREATININE, CALCIUM, MG, PHOS in the last 168 hours. GFR: CrCl cannot be calculated (Patient's most recent lab result is older than the maximum 21 days allowed.). Liver Function Tests: No results for input(s): AST, ALT, ALKPHOS, BILITOT, PROT, ALBUMIN in the last 168 hours. No results for input(s): LIPASE, AMYLASE in the last 168 hours. No results for input(s): AMMONIA in the last 168 hours. Coagulation Profile: No results for input(s): INR, PROTIME in the last 168 hours. Cardiac Enzymes: No results for input(s): CKTOTAL, CKMB, CKMBINDEX, TROPONINI in the last 168 hours. BNP (last 3 results) No results for input(s): PROBNP in the last 8760 hours. HbA1C: No results for input(s): HGBA1C in the last 72 hours. CBG: No results for input(s): GLUCAP in the last 168 hours. Lipid Profile: No results for input(s): CHOL, HDL, LDLCALC,  TRIG, CHOLHDL, LDLDIRECT in the last 72 hours. Thyroid Function Tests: No results for input(s): TSH, T4TOTAL, FREET4, T3FREE, THYROIDAB in the last 72 hours. Anemia Panel: No results for input(s): VITAMINB12, FOLATE, FERRITIN, TIBC, IRON, RETICCTPCT in the last 72 hours. Urine analysis:    Component Value Date/Time   COLORURINE YELLOW 02/11/2020 1750   APPEARANCEUR HAZY (A) 02/11/2020 1750   LABSPEC <1.005 (L) 02/11/2020 1750   PHURINE 7.0 02/11/2020 1750   GLUCOSEU NEGATIVE 02/11/2020 1750   HGBUR TRACE (A) 02/11/2020 1750   BILIRUBINUR NEGATIVE 02/11/2020 1750   KETONESUR NEGATIVE 02/11/2020 1750   PROTEINUR NEGATIVE 02/11/2020 1750   UROBILINOGEN 1.0 02/28/2008 1445   NITRITE POSITIVE (A) 02/11/2020 1750   LEUKOCYTESUR NEGATIVE 02/11/2020 1750    Radiological Exams on Admission: DG Tibia/Fibula Right  Result Date: 06/02/2020 CLINICAL DATA:  Twisting injury 5 days ago, pain and swelling, bruising EXAM: RIGHT TIBIA AND FIBULA - 2 VIEW COMPARISON:  12/19/2019 FINDINGS: Frontal and lateral views of the right tibia and fibula are obtained. There is a comminuted displaced proximal tibial metadiaphyseal fracture with mild distraction of the fracture fragments. The fracture line does not extend to the joint space of the right knee. There is a comminuted displaced fracture of the proximal fibular head, moderately displaced. A nondisplaced oblique fracture of the distal fibular diaphysis is also noted. There is diffuse soft tissue edema of the right lower extremity. Right knee is well aligned. There is slight widening of the ankle mortise laterally which may reflect ligamentous injury. IMPRESSION: 1. Comminuted minimally displaced fracture of the proximal right tibial metadiaphyseal junction. The fracture does not extend to the articular surface of the right knee. 2. Comminuted proximal fibular head fracture, with nondisplaced distal fibular diaphyseal fracture. 3. Widening of the ankle mortise  laterally, which may reflect ligamentous injury. 4. Diffuse soft tissue edema. Electronically Signed   By: Sharlet Salina M.D.   On: 06/02/2020 20:16   DG Knee Complete 4 Views Right  Result Date: 06/02/2020 CLINICAL DATA:  Fall EXAM: RIGHT KNEE - COMPLETE 4+ VIEW COMPARISON:  None. FINDINGS: There is a comminuted fracture of proximal right tibia with lateral displacement. There is no intra-articular extension. The knee remains located. There is  a comminuted fracture of the proximal fibula, also with mild lateral displacement. There is a distal fibular fracture which is demonstrated concomitant tibia/fibular radiographs. No distal femoral fracture. No knee effusion. IMPRESSION: 1. Comminuted fracture of the proximal right tibia with lateral displacement. 2. Comminuted fracture of the proximal fibula with mild lateral displacement. Electronically Signed   By: Deatra RobinsonKevin  Herman M.D.   On: 06/02/2020 21:28    Assessment/Plan Principal Problem:   Tibia/fibula fracture Active Problems:   HTN (hypertension)   Dyslipidemia   History of CVA with residual deficit   OSA (obstructive sleep apnea)   Right tib-fib fractures -X-ray of right tibia/fibula showing comminuted minimally displaced fracture of the proximal right tibial metadiaphyseal junction.  Comminuted proximal fibular head fracture with nondisplaced distal fibular diaphyseal fracture.  Widening of the ankle mortise laterally, may reflect ligamentous injury.  Diffuse soft tissue edema. -Orthopedics consulted and recommended additional imaging of the right ankle and knee (x-rays pending). Recommended long-leg splint which was applied in the ED. Planning for surgery in the morning. -Keep n.p.o. after midnight -Pain management  Hypertension: Stable. -Continue lisinopril  Hyperlipidemia -Continue Zetia  OSA -Continue CPAP at night  GERD -Continue PPI  DVT prophylaxis: SCDs Code Status: Full code Family Communication: Husband at  bedside. Disposition Plan: Status is: Inpatient  Remains inpatient appropriate because:Ongoing active pain requiring inpatient pain management, Inpatient level of care appropriate due to severity of illness and Needs surgery for right tib-fib fractures   Dispo: The patient is from: Home              Anticipated d/c is to: Home              Anticipated d/c date is: 3 days              Patient currently is not medically stable to d/c.   Difficult to place patient No  Level of care: MedSurg  The medical decision making on this patient was of high complexity and the patient is at high risk for clinical deterioration, therefore this is a level 3 visit.  John GiovanniVasundhra Andreia Gandolfi MD [facility]  If 7PM-7AM, please contact night-coverage [email or web address]  06/02/2020, 9:31 PM

## 2020-06-02 NOTE — Progress Notes (Signed)
Patient refuses nocturnal CPAP tonight. RT will continue to follow and encourage use. Patient is aware that she may call for assistance if she should change her mind and decide to wear it tonight.

## 2020-06-02 NOTE — ED Triage Notes (Signed)
Patient is coming from her primary care dr with a cd stating that she has a tib fib fracture

## 2020-06-02 NOTE — Progress Notes (Signed)
Orthopedic Tech Progress Note Patient Details:  Heidi Green Feb 23, 1956 [id number]  Ortho Devices Type of Ortho Device: Ace wrap,Long leg splint,Stirrup splint Ortho Device/Splint Location: RLE Ortho Device/Splint Interventions: Ordered,Application   Post Interventions Patient Tolerated: Well Instructions Provided: Care of device   Jennye Moccasin 06/02/2020, 9:56 PM

## 2020-06-02 NOTE — ED Provider Notes (Signed)
[facility]-EMERGENCY DEPT Provider Note   CSN: [id number] Arrival date & time: 06/02/20  1914     History Chief Complaint  Patient presents with  . Leg Injury    ONEDIA VARGUS is a 65 y.o. female.  HPI 65 year old female who presents to the ER from her PCPs office with a reported tib-fib fracture.  Patient states that she fell on Sunday, denies any dizziness, chest pain, shortness of breath during the fall.  Peers to be mechanical, she was transferring from the bed and lost her balance.  She has had difficulty bearing weight on that leg and turning.  She has noted some bruising to her lower leg as well.  No numbness or tingling, but does have significant swelling to the lower extremity.    Past Medical History:  Diagnosis Date  . Arthritis    in back  . Back pain   . Chest pain 06/16/2012   negative cath with normal LV function  . Headache(784.0)   . Hypertension   . 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 Kidney stones   . Sleep apnea   . Stroke [facility])     Patient Active Problem List   Diagnosis Date Noted  . Tibia/fibula fracture 06/02/2020  . OSA (obstructive sleep apnea) 06/02/2020  . Acute respiratory failure with hypoxia (HCC) 12/19/2019  . History of CVA with residual deficit 12/19/2019  . Severe sepsis with acute organ dysfunction (HCC) 12/19/2019  . Sepsis due to cellulitis (HCC) 08/19/2018  . Hypotension 08/19/2018  . Chronic low back pain 08/19/2018  . HCAP (healthcare-associated pneumonia) 11/11/2017  . Acute lower UTI 11/11/2017  . AKI (acute kidney injury) (HCC) 11/11/2017  . Lactic acid acidosis 11/11/2017  . Severe sepsis (HCC) 11/11/2017  . Community acquired bilateral lower lobe pneumonia 10/04/2017  . Hypokalemia 08/01/2016  . Class 2 obesity   . HTN (hypertension) 06/16/2012  . Dyslipidemia 06/16/2012  . Back pain 06/16/2012    Past Surgical History:  Procedure Laterality Date  .  ABDOMINAL HYSTERECTOMY    . BACK SURGERY     x 3  . Left ACL repair  2004  . LEFT HEART CATHETERIZATION WITH CORONARY ANGIOGRAM N/A 06/16/2012   Procedure: LEFT HEART CATHETERIZATION WITH CORONARY ANGIOGRAM;  Surgeon: Peter M Swaz[place], MD;  Location: [facility] CATH LAB;  Service: Cardiovascular;  Laterality: N/A;  . lipoma excision from neck  2006  . LITHOTRIPSY    . TUBAL LIGATION       OB History   No obstetric history on file.     Family History  Problem Relation Age of Onset  . Emphysema Mother   . Diabetes Mother     Social History   Tobacco Use  . Smoking status: Former Smoker    Packs/day: 0.50    Years: 26.00    Pack years: 13.00    Types: Cigarettes    Quit date: 06/17/2012    Years since quitting: 7.9  . Smokeless tobacco: Never Used  Substance Use Topics  . Alcohol use: No  . Drug use: No    Home Medications Prior to Admission medications   Medication Sig Start Date End Date Taking? Authorizing Provider  albuterol (VENTOLIN HFA) 108 (90 Base) MCG/ACT inhaler Inhale 2 puffs into the lungs every 6 (six) hours as needed for wheezing or shortness of breath. 12/22/19   Rhetta Mura, MD  collagenase (SANTYL) ointment Apply 1 application topically daily. Apply to legs  with each dressing change    [provider]  diclofenac (VOLTAREN) 50 MG EC tablet Take 50 mg by mouth 2 (two) times daily. 01/23/20   [provider]  doxycycline (ADOXA) 100 MG tablet Take 100 mg by mouth 2 (two) times daily. 01/16/20   [provider]  esomeprazole (NEXIUM) 40 MG capsule Take 40 mg by mouth daily with supper.  08/18/18   [provider]  ezetimibe (ZETIA) 10 MG tablet Take 10 mg by mouth daily with supper. 11/11/19   [provider]  FEROSUL 325 (65 Fe) MG tablet Take 325 mg by mouth 2 (two) times daily with a meal.  10/15/19   [provider]  HYDROcodone-acetaminophen (NORCO) 7.5-325 MG tablet Take 1 tablet by mouth 4 (four) times daily.      [provider]  lisinopril (ZESTRIL) 20 MG tablet Take 20 mg by mouth daily. 02/02/20   [provider]  lisinopril (ZESTRIL) 30 MG tablet Take 30 mg by mouth daily. 02/10/20   [provider]  mirabegron ER (MYRBETRIQ) 50 MG TB24 tablet Take 50 mg by mouth daily with supper.    [provider]  oxybutynin (DITROPAN-XL) 5 MG 24 hr tablet Take 5 mg by mouth daily. 01/08/20   [provider]  pregabalin (LYRICA) 150 MG capsule Take 150 mg by mouth 2 (two) times daily.    [provider]  rOPINIRole (REQUIP) 2 MG tablet Take 2 mg by mouth 2 (two) times daily.     [provider]  Vitamin D, Ergocalciferol, (DRISDOL) 50000 units CAPS capsule Take 50,000 Units by mouth every Sunday.  10/31/17   [provider]  zolpidem (AMBIEN) 10 MG tablet Take 5 mg by mouth at bedtime. 12/10/19   [provider]    Allergies    Buprenorphine, Garamycin [gentamicin sulfate], Other, Penicillins, Atorvastatin, Norvasc [amlodipine besylate], and Rosuvastatin  Review of Systems   Review of Systems  Constitutional: Negative for chills and fever.  HENT: Negative for ear pain and sore throat.   Eyes: Negative for pain and visual disturbance.  Respiratory: Negative for cough and shortness of breath.   Cardiovascular: Negative for chest pain and palpitations.  Gastrointestinal: Negative for abdominal pain and vomiting.  Genitourinary: Negative for dysuria and hematuria.  Musculoskeletal: Positive for arthralgias. Negative for back pain.  Skin: Positive for color change. Negative for rash.  Neurological: Positive for weakness. Negative for seizures, syncope and numbness.  All other systems reviewed and are negative.   Physical Exam Updated Vital Signs BP (!) 150/85   Pulse 73   Temp 97.9 F (36.6 C) (Oral)   Resp 19   Ht 5\' 9"  (1.753 m)   Wt 117.9 kg   SpO2 95%   BMI 38.40 kg/m   Physical Exam Vitals and nursing note  reviewed.  Constitutional:      General: She is not in acute distress.    Appearance: She is well-developed and well-nourished. She is obese. She is not ill-appearing or diaphoretic.  HENT:     Head: Normocephalic and atraumatic.  Eyes:     Conjunctiva/sclera: Conjunctivae normal.  Cardiovascular:     Rate and Rhythm: Normal rate and regular rhythm.     Pulses: Normal pulses.     Heart sounds: No murmur heard.   Pulmonary:     Effort: Pulmonary effort is normal. No respiratory distress.     Breath sounds: Normal breath sounds.  Abdominal:     Palpations: Abdomen is  soft.     Tenderness: There is no abdominal tenderness.  Musculoskeletal:        General: Swelling, tenderness and signs of injury present.     Cervical back: Normal range of motion and neck supple.     Right lower leg: Edema present.     Comments: Chronic wounds to the left lower extremity.  Right lower extremity with significant edema to the calf and foot, bruising to the anterior portion of the right shin.  Right foot pulses located with Doppler.  No evidence of open fracture.  Skin:    General: Skin is warm and dry.     Findings: Bruising present.  Neurological:     General: No focal deficit present.     Mental Status: She is alert and oriented to person, place, and time.     Sensory: No sensory deficit.     Motor: Weakness (Secondary to pain) present.  Psychiatric:        Mood and Affect: Mood and affect and mood normal.        Behavior: Behavior normal.     ED Results / Procedures / Treatments   Labs (all labs ordered are listed, but only abnormal results are displayed) Labs Reviewed  RESP PANEL BY RT-PCR (FLU A&B, COVID) ARPGX2  CBC  BASIC METABOLIC PANEL    EKG None  Radiology DG Tibia/Fibula Right  Result Date: 06/02/2020 CLINICAL DATA:  Twisting injury 5 days ago, pain and swelling, bruising EXAM: RIGHT TIBIA AND FIBULA - 2 VIEW COMPARISON:  12/19/2019 FINDINGS: Frontal and lateral views of  the right tibia and fibula are obtained. There is a comminuted displaced proximal tibial metadiaphyseal fracture with mild distraction of the fracture fragments. The fracture line does not extend to the joint space of the right knee. There is a comminuted displaced fracture of the proximal fibular head, moderately displaced. A nondisplaced oblique fracture of the distal fibular diaphysis is also noted. There is diffuse soft tissue edema of the right lower extremity. Right knee is well aligned. There is slight widening of the ankle mortise laterally which may reflect ligamentous injury. IMPRESSION: 1. Comminuted minimally displaced fracture of the proximal right tibial metadiaphyseal junction. The fracture does not extend to the articular surface of the right knee. 2. Comminuted proximal fibular head fracture, with nondisplaced distal fibular diaphyseal fracture. 3. Widening of the ankle mortise laterally, which may reflect ligamentous injury. 4. Diffuse soft tissue edema. Electronically Signed   By: Sharlet Salina M.D.   On: 06/02/2020 20:16   DG Ankle Complete Right  Result Date: 06/02/2020 CLINICAL DATA:  Fall EXAM: RIGHT ANKLE - COMPLETE 3+ VIEW COMPARISON:  None. FINDINGS: There is an oblique fracture of the distal right fibula. No ankle dislocation or joint effusion. IMPRESSION: Oblique fracture of the distal right fibula. Electronically Signed   By: Deatra Robinson M.D.   On: 06/02/2020 21:29   DG Knee Complete 4 Views Right  Result Date: 06/02/2020 CLINICAL DATA:  Fall EXAM: RIGHT KNEE - COMPLETE 4+ VIEW COMPARISON:  None. FINDINGS: There is a comminuted fracture of proximal right tibia with lateral displacement. There is no intra-articular extension. The knee remains located. There is a comminuted fracture of the proximal fibula, also with mild lateral displacement. There is a distal fibular fracture which is demonstrated concomitant tibia/fibular radiographs. No distal femoral fracture. No knee  effusion. IMPRESSION: 1. Comminuted fracture of the proximal right tibia with lateral displacement. 2. Comminuted fracture of the proximal fibula with mild lateral  displacement. Electronically Signed   By: Deatra RobinsonKevin  Herman M.D.   On: 06/02/2020 21:28    Procedures Procedures   Medications Ordered in ED Medications  acetaminophen (TYLENOL) tablet 650 mg (has no administration in time range)    Or  acetaminophen (TYLENOL) suppository 650 mg (has no administration in time range)  fentaNYL (SUBLIMAZE) injection 25-50 mcg (has no administration in time range)  fentaNYL (SUBLIMAZE) injection 50 mcg (50 mcg Intramuscular Given 06/02/20 2004)    ED Course  I have reviewed the triage vital signs and the nursing notes.  Pertinent labs & imaging results that were available during my care of the patient were reviewed by me and considered in my medical decision making (see chart for details).    MDM Rules/Calculators/A&P                         65 year old female presents after a fall on Sunday.  Noted to have a tib-fib fracture at her PCPs office today.  Plain films here confirm a comminuted displaced fracture of the proximal right tibial metadiaphyseal junction, as well as a proximal fibular head fracture with nondisplaced fibular diaphyseal fracture.  There is also some widening of the ankle mortise laterally.  She also has some significant edema.  No evidence of open fracture.  Spoke with Dr. Linna CapriceSwinteck with orthopedics, who recommends long-leg splint with short leg you around the ankle.  Patient was provided analgesia here in the ER.  He also requested dedicated plain films of the knee and ankle which I have ordered.   Plain films of the left ankle with a distal right fibula fracture as well.  Plain films of the right knee confirming known fractures.   Patient was splinted with no difficulty.  Preop labs ordered.  Consulted hospitalist team for admission.  Covid test pending.  Stable for admission at  this time.   Final Clinical Impressio n(s) / ED Diagnoses Final diagnoses:  Fall, initial encounter  Closed fracture of proximal end of right tibia, unspecified fracture morphology, initial encounter  Closed fracture of proximal end of right fibula, unspecified fracture morphology, initial encounter    Rx / DC Orders ED Discharge Orders    None       Leone BrandBelaya,  A, PA-C 06/02/20 2155    Gwyneth SproutPlunkett, Whitney, MD 06/03/20 2132

## 2020-06-02 NOTE — Progress Notes (Signed)
Consult received from ED for subacute closed R proximal tibia fx, distal fibula fracture with ?syndesmotic injury. Patient is wheelchair dependent with multiple medical problems. EDP has consulted [facility] for admission. Plan for IM nail R tibia, live stress stress & possible syndesmotic fixation tomorrow am. NPO after MN. Hold chemical DVT ppx.

## 2020-06-03 ENCOUNTER — Encounter (HOSPITAL_COMMUNITY)
Admission: EM | Disposition: A | Discharge status: Home Health | Payer: Self-pay | Source: Home / Self Care | Attending: Internal Medicine

## 2020-06-03 ENCOUNTER — Encounter (HOSPITAL_COMMUNITY): Payer: Self-pay | Admitting: Internal Medicine

## 2020-06-03 ENCOUNTER — Inpatient Hospital Stay (HOSPITAL_COMMUNITY): Payer: Medicare HMO | Admitting: Certified Registered Nurse Anesthetist

## 2020-06-03 ENCOUNTER — Inpatient Hospital Stay (HOSPITAL_COMMUNITY): Payer: Medicare HMO

## 2020-06-03 ENCOUNTER — Inpatient Hospital Stay: Admit: 2020-06-03 | Payer: Medicare HMO | Admitting: Orthopedic Surgery

## 2020-06-03 DIAGNOSIS — G4733 Obstructive sleep apnea (adult) (pediatric): Secondary | ICD-10-CM | POA: Diagnosis not present

## 2020-06-03 DIAGNOSIS — S82401P Unspecified fracture of shaft of right fibula, subsequent encounter for closed fracture with malunion: Secondary | ICD-10-CM

## 2020-06-03 DIAGNOSIS — E785 Hyperlipidemia, unspecified: Secondary | ICD-10-CM

## 2020-06-03 DIAGNOSIS — I10 Essential (primary) hypertension: Secondary | ICD-10-CM

## 2020-06-03 DIAGNOSIS — S82201P Unspecified fracture of shaft of right tibia, subsequent encounter for closed fracture with malunion: Secondary | ICD-10-CM

## 2020-06-03 HISTORY — PX: ORIF TIBIA FRACTURE: SHX5416

## 2020-06-03 LAB — CBC
HCT: 37 % (ref 36.0–46.0)
Hemoglobin: 11.6 g/dL — ABNORMAL LOW (ref 12.0–15.0)
MCH: 29 pg (ref 26.0–34.0)
MCHC: 31.4 g/dL (ref 30.0–36.0)
MCV: 92.5 fL (ref 80.0–100.0)
Platelets: 279 10*3/uL (ref 150–400)
RBC: 4 MIL/uL (ref 3.87–5.11)
RDW: 15 % (ref 11.5–15.5)
WBC: 10.9 10*3/uL — ABNORMAL HIGH (ref 4.0–10.5)
nRBC: 0 % (ref 0.0–0.2)

## 2020-06-03 LAB — BASIC METABOLIC PANEL WITH GFR
Anion gap: 11 (ref 5–15)
BUN: 16 mg/dL (ref 8–23)
CO2: 24 mmol/L (ref 22–32)
Calcium: 8.6 mg/dL — ABNORMAL LOW (ref 8.9–10.3)
Chloride: 108 mmol/L (ref 98–111)
Creatinine, Ser: 0.59 mg/dL (ref 0.44–1.00)
GFR, Estimated: >60 mL/min
Glucose, Bld: 95 mg/dL (ref 70–99)
Potassium: 3.1 mmol/L — ABNORMAL LOW (ref 3.5–5.1)
Sodium: 143 mmol/L (ref 135–145)

## 2020-06-03 LAB — MAGNESIUM: Magnesium: 2.2 mg/dL (ref 1.7–2.4)

## 2020-06-03 LAB — MRSA PCR SCREENING: MRSA by PCR: NEGATIVE

## 2020-06-03 SURGERY — OPEN REDUCTION INTERNAL FIXATION (ORIF) TIBIA FRACTURE
Anesthesia: General | Site: Leg Lower | Laterality: Right

## 2020-06-03 MED ORDER — PHENYLEPHRINE 40 MCG/ML (10ML) SYRINGE FOR IV PUSH (FOR BLOOD PRESSURE SUPPORT)
PREFILLED_SYRINGE | INTRAVENOUS | Status: DC | PRN
  Administered 2020-06-03: 80 ug via INTRAVENOUS

## 2020-06-03 MED ORDER — ACETAMINOPHEN 10 MG/ML IV SOLN: 1000.0000 mg | Freq: Once | INTRAVENOUS | Status: DC | PRN

## 2020-06-03 MED ORDER — ENOXAPARIN SODIUM 40 MG/0.4ML ~~LOC~~ SOLN
40.0000 mg | SUBCUTANEOUS | Status: DC
  Filled 2020-06-03 (×2): qty 0.4

## 2020-06-03 MED ORDER — SODIUM CHLORIDE 0.9 % IR SOLN
Status: DC | PRN
  Administered 2020-06-03: 1000 mL

## 2020-06-03 MED ORDER — LIDOCAINE 2% (20 MG/ML) 5 ML SYRINGE
INTRAMUSCULAR | Status: DC | PRN
  Administered 2020-06-03: 50 mg via INTRAVENOUS

## 2020-06-03 MED ORDER — ONDANSETRON HCL 4 MG/2ML IJ SOLN: 4.0000 mg | Freq: Four times a day (QID) | INTRAMUSCULAR | Status: DC | PRN

## 2020-06-03 MED ORDER — METOCLOPRAMIDE HCL 5 MG PO TABS: 5.0000 mg | ORAL_TABLET | Freq: Three times a day (TID) | ORAL | Status: DC | PRN

## 2020-06-03 MED ORDER — FENTANYL CITRATE (PF) 100 MCG/2ML IJ SOLN
INTRAMUSCULAR | Status: AC
  Filled 2020-06-03: qty 2

## 2020-06-03 MED ORDER — POTASSIUM CHLORIDE CRYS ER 20 MEQ PO TBCR
40.0000 meq | EXTENDED_RELEASE_TABLET | Freq: Once | ORAL | Status: AC
  Administered 2020-06-03: 20 meq via ORAL
  Filled 2020-06-03: qty 2

## 2020-06-03 MED ORDER — MIDAZOLAM HCL 2 MG/2ML IJ SOLN
INTRAMUSCULAR | Status: AC
  Filled 2020-06-03: qty 2

## 2020-06-03 MED ORDER — ONDANSETRON HCL 4 MG/2ML IJ SOLN
INTRAMUSCULAR | Status: DC | PRN
  Administered 2020-06-03: 4 mg via INTRAVENOUS

## 2020-06-03 MED ORDER — AMISULPRIDE (ANTIEMETIC) 5 MG/2ML IV SOLN: 10.0000 mg | Freq: Once | INTRAVENOUS | Status: DC | PRN

## 2020-06-03 MED ORDER — HYDROMORPHONE HCL 1 MG/ML IJ SOLN
0.2500 mg | INTRAMUSCULAR | Status: DC | PRN
  Administered 2020-06-03 (×4): 0.5 mg via INTRAVENOUS

## 2020-06-03 MED ORDER — METOCLOPRAMIDE HCL 5 MG/ML IJ SOLN: 5.0000 mg | Freq: Three times a day (TID) | INTRAMUSCULAR | Status: DC | PRN

## 2020-06-03 MED ORDER — PROPOFOL 10 MG/ML IV BOLUS
INTRAVENOUS | Status: DC | PRN
  Administered 2020-06-03: 100 mg via INTRAVENOUS

## 2020-06-03 MED ORDER — ACETAMINOPHEN 325 MG PO TABS: 325.0000 mg | ORAL_TABLET | Freq: Once | ORAL | Status: DC | PRN

## 2020-06-03 MED ORDER — LACTATED RINGERS IV SOLN: INTRAVENOUS | Status: DC

## 2020-06-03 MED ORDER — ISOPROPYL ALCOHOL 70 % SOLN
Status: AC
  Filled 2020-06-03: qty 480

## 2020-06-03 MED ORDER — HYDROMORPHONE HCL 1 MG/ML IJ SOLN
INTRAMUSCULAR | Status: AC
  Filled 2020-06-03: qty 1

## 2020-06-03 MED ORDER — DOCUSATE SODIUM 100 MG PO CAPS
100.0000 mg | ORAL_CAPSULE | Freq: Two times a day (BID) | ORAL | Status: DC
  Administered 2020-06-03 – 2020-06-04 (×4): 100 mg via ORAL
  Filled 2020-06-03 (×4): qty 1

## 2020-06-03 MED ORDER — SUGAMMADEX SODIUM 200 MG/2ML IV SOLN
INTRAVENOUS | Status: DC | PRN
  Administered 2020-06-03: 200 mg via INTRAVENOUS

## 2020-06-03 MED ORDER — ROCURONIUM BROMIDE 10 MG/ML (PF) SYRINGE
PREFILLED_SYRINGE | INTRAVENOUS | Status: DC | PRN
  Administered 2020-06-03: 80 mg via INTRAVENOUS

## 2020-06-03 MED ORDER — ACETAMINOPHEN 160 MG/5ML PO SOLN: 325.0000 mg | Freq: Once | ORAL | Status: DC | PRN

## 2020-06-03 MED ORDER — EPHEDRINE SULFATE-NACL 50-0.9 MG/10ML-% IV SOSY
PREFILLED_SYRINGE | INTRAVENOUS | Status: DC | PRN
  Administered 2020-06-03: 10 mg via INTRAVENOUS
  Administered 2020-06-03: 5 mg via INTRAVENOUS

## 2020-06-03 MED ORDER — ISOPROPYL ALCOHOL 70 % SOLN
Status: DC | PRN
  Administered 2020-06-03: 1 via TOPICAL

## 2020-06-03 MED ORDER — FENTANYL CITRATE (PF) 100 MCG/2ML IJ SOLN
INTRAMUSCULAR | Status: DC | PRN
  Administered 2020-06-03 (×2): 50 ug via INTRAVENOUS
  Administered 2020-06-03: 100 ug via INTRAVENOUS

## 2020-06-03 MED ORDER — METHOCARBAMOL 500 MG PO TABS
500.0000 mg | ORAL_TABLET | Freq: Four times a day (QID) | ORAL | Status: DC | PRN
  Administered 2020-06-04 – 2020-06-05 (×5): 500 mg via ORAL
  Filled 2020-06-03 (×5): qty 1

## 2020-06-03 MED ORDER — ONDANSETRON HCL 4 MG PO TABS: 4.0000 mg | ORAL_TABLET | Freq: Four times a day (QID) | ORAL | Status: DC | PRN

## 2020-06-03 MED ORDER — CLINDAMYCIN PHOSPHATE 900 MG/50ML IV SOLN: 900.0000 mg | INTRAVENOUS | Status: DC

## 2020-06-03 MED ORDER — LACTATED RINGERS IV SOLN: INTRAVENOUS | Status: DC | PRN

## 2020-06-03 MED ORDER — PROPOFOL 10 MG/ML IV BOLUS
INTRAVENOUS | Status: AC
  Filled 2020-06-03: qty 20

## 2020-06-03 MED ORDER — VANCOMYCIN HCL 1500 MG/300ML IV SOLN
1500.0000 mg | INTRAVENOUS | Status: AC
  Administered 2020-06-03: 1500 mg via INTRAVENOUS
  Filled 2020-06-03: qty 300

## 2020-06-03 MED ORDER — PROPOFOL 1000 MG/100ML IV EMUL
INTRAVENOUS | Status: AC
  Filled 2020-06-03: qty 100

## 2020-06-03 MED ORDER — POTASSIUM CHLORIDE CRYS ER 20 MEQ PO TBCR
40.0000 meq | EXTENDED_RELEASE_TABLET | ORAL | Status: DC
  Administered 2020-06-03: 20 meq via ORAL
  Filled 2020-06-03: qty 2

## 2020-06-03 MED ORDER — METHOCARBAMOL 500 MG IVPB - SIMPLE MED
500.0000 mg | Freq: Four times a day (QID) | INTRAVENOUS | Status: DC | PRN
  Filled 2020-06-03: qty 50

## 2020-06-03 MED ORDER — DEXAMETHASONE SODIUM PHOSPHATE 10 MG/ML IJ SOLN
INTRAMUSCULAR | Status: DC | PRN
  Administered 2020-06-03: 10 mg via INTRAVENOUS

## 2020-06-03 MED ORDER — CHLORHEXIDINE GLUCONATE 4 % EX LIQD
60.0000 mL | Freq: Once | CUTANEOUS | Status: AC
  Administered 2020-06-03: 4 via TOPICAL
  Filled 2020-06-03: qty 60

## 2020-06-03 MED ORDER — VANCOMYCIN HCL IN DEXTROSE 1-5 GM/200ML-% IV SOLN
1000.0000 mg | Freq: Two times a day (BID) | INTRAVENOUS | Status: AC
  Administered 2020-06-03: 1000 mg via INTRAVENOUS
  Filled 2020-06-03: qty 200

## 2020-06-03 MED ORDER — MIDAZOLAM HCL 5 MG/5ML IJ SOLN
INTRAMUSCULAR | Status: DC | PRN
  Administered 2020-06-03: 2 mg via INTRAVENOUS

## 2020-06-03 MED ORDER — MEPERIDINE HCL 50 MG/ML IJ SOLN: 6.2500 mg | INTRAMUSCULAR | Status: DC | PRN

## 2020-06-03 SURGICAL SUPPLY — 59 items
BAG ZIPLOCK 12X15 (MISCELLANEOUS) ×2 IMPLANT
BIT DRILL 3.8X6 NS (BIT) ×4 IMPLANT
BIT DRILL 4.4 NS (BIT) ×2 IMPLANT
BNDG ELASTIC 4X5.8 VLCR STR LF (GAUZE/BANDAGES/DRESSINGS) ×2 IMPLANT
BNDG ELASTIC 6X5.8 VLCR STR LF (GAUZE/BANDAGES/DRESSINGS) ×2 IMPLANT
CHLORAPREP W/TINT 26 (MISCELLANEOUS) ×4 IMPLANT
COVER SURGICAL LIGHT HANDLE (MISCELLANEOUS) ×2 IMPLANT
COVER WAND RF STERILE (DRAPES) IMPLANT
CUFF TOURN SGL QUICK 34 (TOURNIQUET CUFF) ×2
CUFF TRNQT CYL 34X4.125X (TOURNIQUET CUFF) ×1 IMPLANT
DRAPE C-ARM 42X120 X-RAY (DRAPES) ×2 IMPLANT
DRAPE C-ARMOR (DRAPES) ×2 IMPLANT
DRAPE POUCH INSTRU U-SHP 10X18 (DRAPES) ×2 IMPLANT
DRAPE SHEET LG 3/4 BI-LAMINATE (DRAPES) ×6 IMPLANT
DRAPE U-SHAPE 47X51 STRL (DRAPES) ×2 IMPLANT
DRSG ADAPTIC 3X8 NADH LF (GAUZE/BANDAGES/DRESSINGS) ×2 IMPLANT
DRSG EMULSION OIL 3X3 NADH (GAUZE/BANDAGES/DRESSINGS) ×2 IMPLANT
DRSG MEPILEX BORDER 4X4 (GAUZE/BANDAGES/DRESSINGS) IMPLANT
DRSG MEPILEX BORDER 4X8 (GAUZE/BANDAGES/DRESSINGS) IMPLANT
DRSG PAD ABDOMINAL 8X10 ST (GAUZE/BANDAGES/DRESSINGS) ×2 IMPLANT
ELECT REM PT RETURN 15FT ADLT (MISCELLANEOUS) ×2 IMPLANT
FACESHIELD WRAPAROUND (MASK) ×4 IMPLANT
GAUZE SPONGE 4X4 12PLY STRL (GAUZE/BANDAGES/DRESSINGS) ×2 IMPLANT
GLOVE BIO SURGEON STRL SZ8.5 (GLOVE) ×4 IMPLANT
GLOVE SRG 8 PF TXTR STRL LF DI (GLOVE) ×1 IMPLANT
GLOVE SURG ENC TEXT LTX SZ7.5 (GLOVE) ×4 IMPLANT
GLOVE SURG UNDER POLY LF SZ8 (GLOVE) ×2
GLOVE SURG UNDER POLY LF SZ8.5 (GLOVE) ×2 IMPLANT
GOWN SPEC L3 XXLG W/TWL (GOWN DISPOSABLE) ×2 IMPLANT
GUIDEPIN 3.2X17.5 THRD DISP (PIN) ×2 IMPLANT
GUIDEWIRE BALL NOSE 80CM (WIRE) ×4 IMPLANT
KIT TURNOVER KIT A (KITS) ×2 IMPLANT
MANIFOLD NEPTUNE II (INSTRUMENTS) ×2 IMPLANT
NAIL TIBIAL 11MMX31.5CM (Nail) ×2 IMPLANT
NS IRRIG 1000ML POUR BTL (IV SOLUTION) ×2 IMPLANT
PACK TOTAL JOINT (CUSTOM PROCEDURE TRAY) ×2 IMPLANT
PAD CAST 4YDX4 CTTN HI CHSV (CAST SUPPLIES) ×1 IMPLANT
PADDING CAST ABS 4INX4YD NS (CAST SUPPLIES) ×1
PADDING CAST ABS COTTON 4X4 ST (CAST SUPPLIES) ×1 IMPLANT
PADDING CAST COTTON 4X4 STRL (CAST SUPPLIES) ×2
PENCIL SMOKE EVACUATOR (MISCELLANEOUS) IMPLANT
PROTECTOR NERVE ULNAR (MISCELLANEOUS) ×2 IMPLANT
SCREW ACECAP 38MM (Screw) ×2 IMPLANT
SCREW ACECAP 42MM (Screw) ×2 IMPLANT
SCREW CORTICAL 5.5 35MM (Screw) ×2 IMPLANT
SCREW PROXIMAL DEPUY (Screw) ×4 IMPLANT
SCREW PRXML FT 45X5.5XLCK NS (Screw) ×1 IMPLANT
SCREW PRXML FT 50X5.5XLCK NS (Screw) ×1 IMPLANT
STAPLER VISISTAT 35W (STAPLE) ×2 IMPLANT
SUT STRATAFIX PDO 1 14 VIOLET (SUTURE) ×4
SUT STRATFX PDO 1 14 VIOLET (SUTURE) ×2
SUT VIC AB 1 CTX 36 (SUTURE) ×2
SUT VIC AB 1 CTX36XBRD ANBCTR (SUTURE) ×1 IMPLANT
SUT VIC AB 2-0 CT1 27 (SUTURE) ×4
SUT VIC AB 2-0 CT1 TAPERPNT 27 (SUTURE) ×2 IMPLANT
SUTURE STRATFX PDO 1 14 VIOLET (SUTURE) ×2 IMPLANT
TOWEL OR 17X26 10 PK STRL BLUE (TOWEL DISPOSABLE) ×4 IMPLANT
TRAY FOLEY MTR SLVR 14FR STAT (SET/KITS/TRAYS/PACK) ×2 IMPLANT
WATER STERILE IRR 1000ML POUR (IV SOLUTION) ×2 IMPLANT

## 2020-06-03 NOTE — Addendum Note (Signed)
Addendum  created 06/03/20 1249 by Lorelee Market, CRNA   [facility] Staff edited

## 2020-06-03 NOTE — Progress Notes (Addendum)
PROGRESS NOTE    Heidi Green  WUJ:[id number] DOB: 04/09/1956 DOA: 06/02/2020 PCP: Chiquita Lothoolidge, [place], [place]    Chief Complaint  Patient presents with  . Leg Injury    Brief Narrative:  Patient 65 year old female history of intracerebral hemorrhage 2018, hypertension, hyperlipidemia, chronic lower extremity wounds, OSA on CPAP, chronic pain syndrome, obesity, wheelchair-bound presenting to the ED from PCPs office with reported right tib-fib fracture. Plain films done of right tib-fib consistent with minimally displaced fracture of the proximal right tibial metadiaphysis junction.  Commuted proximal fibular head fracture with nondisplaced distal fibular diaphyseal fracture. Orthopedics consulted. Patient underwent repair of right tib-fib fracture 06/03/2020   Assessment & Plan:   Principal Problem:   Tibia/fibula fracture Active Problems:   HTN (hypertension)   Dyslipidemia   History of CVA with residual deficit   OSA (obstructive sleep apnea)  1 right tib-fib fracture X-ray of right tib-fib showing commuted minimally displaced fracture of the proximal right tibia metadiaphyseal junction.  Commuted proximal fibular head fracture with nondisplaced distal fibular diaphyseal fracture.  Widening of ankle mortise laterally may reflect ligamentous injury.  Diffuse soft tissue edema. -Orthopedics consulted and additional imaging of right ankle and right knee obtained. -Patient subsequently underwent intramedullary fixation of the right tibia/live external rotation stress testing of the right ankle under fluoroscopy per Dr. Linna CapriceSwinteck 06/03/2020. -PT/OT. -Postop DVT prophylaxis per orthopedics.  2.  Hypertension Stable.  Continue lisinopril.  3.  Hyperlipidemia Zetia.  4.  OSA CPAP nightly.  5.  GERD PPI.  6.  Hypokalemia Replete.   DVT prophylaxis: Postop DVT prophylaxis per orthopedics Code Status: Full Family Communication: Updated patient.  No family at bedside. Disposition:    Status is: Inpatient    Dispo: The patient is from: Home              Anticipated d/c is to: Home with home health versus SNF              Anticipated d/c date is: 2 to 3 days.              Patient currently just postop underwent surgery.  Not stable for discharge.   Difficult to place patient no       Consultants:   Orthopedics: Dr. Linna CapriceSwinteck 06/03/2020  Procedures:   Plain films right tib-fib 06/03/2020, 06/02/2020  Plain films right ankle 06/02/2020  Plain films right knee 06/02/2020  Antimicrobials:   None   Subjective: Patient postop.  Alert.  Denies any chest pain or shortness of breath.  No abdominal pain.  Wondering whether she can eat.  Resistant to idea of SNF.  States she is wheelchair-bound.  Stated she fell at home while trying to transfer from the wheelchair to her bed.  Objective: Vitals:   06/03/20 1140 06/03/20 1145 06/03/20 1200 06/03/20 1219  BP:  131/68 125/75 131/68  Pulse: 66 63 60 70  Resp: 13 14 14    Temp:   97.8 F (36.6 C) 98 F (36.7 C)  TempSrc:    Oral  SpO2: 95% 97% 95% 95%  Weight:      Height:        Intake/Output Summary (Last 24 hours) at 06/03/2020 1315 Last data filed at 06/03/2020 1200 Gross per 24 hour  Intake 1400 ml  Output 1195 ml  Net 205 ml   Filed Weights   06/02/20 1918  Weight: 117.9 kg    Examination:  General exam: Appears calm and comfortable  Respiratory system: Clear to auscultation. Respiratory  effort normal. Cardiovascular system: S1 & S2 heard, RRR. No JVD, murmurs, rubs, gallops or clicks. No pedal edema. Gastrointestinal system: Abdomen is nondistended, soft and nontender. No organomegaly or masses felt. Normal bowel sounds heard. Central nervous system: Alert and oriented. No focal neurological deficits. Extremities: Right lower extremity in postop bandage. Skin: No rashes, lesions or ulcers Psychiatry: Judgement and insight appear normal. Mood & affect appropriate.     Data Reviewed: I have  personally reviewed following labs and imaging studies  CBC: Recent Labs  Lab 06/02/20 2140 06/03/20 0352  WBC 12.5* 10.9*  HGB 12.3 11.6*  HCT 38.3 37.0  MCV 91.6 92.5  PLT 290 279    Basic Metabolic Panel: Recent Labs  Lab 06/02/20 2140 06/03/20 0352  NA 140 143  K 3.1* 3.1*  CL 105 108  CO2 27 24  GLUCOSE 104* 95  BUN 14 16  CREATININE 0.64 0.59  CALCIUM 8.6* 8.6*  MG  --  2.2    GFR: Estimated Creatinine Clearance: 96.2 mL/min (by C-G formula based on SCr of 0.59 mg/dL).  Liver Function Tests: No results for input(s): AST, ALT, ALKPHOS, BILITOT, PROT, ALBUMIN in the last 168 hours.  CBG: No results for input(s): GLUCAP in the last 168 hours.   Recent Results (from the past 240 hour(s))  Resp Panel by RT-PCR (Flu A&B, Covid) Nasopharyngeal Swab     Status: None   Collection Time: 06/02/20  8:51 PM   Specimen: Nasopharyngeal Swab; Nasopharyngeal(NP) swabs in vial transport medium  Result Value Ref Range Status   SARS Coronavirus 2 by RT PCR NEGATIVE NEGATIVE Final    Comment: (NOTE) SARS-CoV-2 target nucleic acids are NOT DETECTED.  The SARS-CoV-2 RNA is generally detectable in upper respiratory specimens during the acute phase of infection. The lowest concentration of SARS-CoV-2 viral copies this assay can detect is 138 copies/mL. A negative result does not preclude SARS-Cov-2 infection and should not be used as the sole basis for treatment or other patient management decisions. A negative result may occur with  improper specimen collection/handling, submission of specimen other than nasopharyngeal swab, presence of viral mutation(s) within the areas targeted by this assay, and inadequate number of viral copies(<138 copies/mL). A negative result must be combined with clinical observations, patient history, and epidemiological information. The expected result is Negative.  Fact Sheet for Patients:  BloggerCourse.com  Fact  Sheet for Healthcare Providers:  SeriousBroker.it  This test is no t yet approved or cleared by the [place] FDA and  has been authorized for detection and/or diagnosis of SARS-CoV-2 by FDA under an Emergency Use Authorization (EUA). This EUA will remain  in effect (meaning this test can be used) for the duration of the COVID-19 declaration under Section 564(b)(1) of the Act, 21 U.S.C.section 360bbb-3(b)(1), unless the authorization is terminated  or revoked sooner.       Influenza A by PCR NEGATIVE NEGATIVE Final   Influenza B by PCR NEGATIVE NEGATIVE Final    Comment: (NOTE) The Xpert Xpress SARS-CoV-2/FLU/RSV plus assay is intended as an aid in the diagnosis of influenza from Nasopharyngeal swab specimens and should not be used as a sole basis for treatment. Nasal washings and aspirates are unacceptable for Xpert Xpress SARS-CoV-2/FLU/RSV testing.  Fact Sheet for Patients: BloggerCourse.com  Fact Sheet for Healthcare Providers: SeriousBroker.it  This test is not yet approved or cleared by the [place] FDA and has been authorized for detection and/or diagnosis of SARS-CoV-2 by FDA under an Emergency Use Authorization (EUA).  This EUA will remain in effect (meaning this test can be used) for the duration of the COVID-19 declaration under Section 564(b)(1) of the Act, 21 U.S.C. section 360bbb-3(b)(1), unless the authorization is terminated or revoked.  Performed at [facility], [address] [place], [place] [address]   MRSA PCR Screening     Status: None   Collection Time: 06/03/20  1:15 AM   Specimen: Nasopharyngeal  Result Value Ref Range Status   MRSA by PCR NEGATIVE NEGATIVE Final    Comment:        The GeneXpert MRSA Assay (FDA approved for NASAL specimens only), is one component of a comprehensive MRSA colonization surveillance program. It is not intended  to diagnose MRSA infection nor to guide or monitor treatment for MRSA infections. Performed at [facility], [address], [place], [place] [address]          Radiology Studies: DG Tibia/Fibula Right  Result Date: 06/03/2020 CLINICAL DATA:  65 year old female with history of right tibial and fibular fractures. EXAM: RIGHT TIBIA AND FIBULA - 2 VIEW COMPARISON:  06/03/2020 and 06/02/2020 FINDINGS: Total of 7 spot images from intraoperative tibial nail placement demonstrate postsurgical changes after tibial nail placement with 3 proximal and 2 distal lag screws. No complicating features. Similar appearing comminuted proximal tibial fracture with improved alignment. Similar appearing proximal and distal fibular fractures without significant displacement. IMPRESSION: Status post right tibial nail fixation without complicating features. Electronically Signed   By: Marliss Coots MD   On: 06/03/2020 11:10   DG Tibia/Fibula Right  Result Date: 06/02/2020 CLINICAL DATA:  Twisting injury 5 days ago, pain and swelling, bruising EXAM: RIGHT TIBIA AND FIBULA - 2 VIEW COMPARISON:  12/19/2019 FINDINGS: Frontal and lateral views of the right tibia and fibula are obtained. There is a comminuted displaced proximal tibial metadiaphyseal fracture with mild distraction of the fracture fragments. The fracture line does not extend to the joint space of the right knee. There is a comminuted displaced fracture of the proximal fibular head, moderately displaced. A nondisplaced oblique fracture of the distal fibular diaphysis is also noted. There is diffuse soft tissue edema of the right lower extremity. Right knee is well aligned. There is slight widening of the ankle mortise laterally which may reflect ligamentous injury. IMPRESSION: 1. Comminuted minimally displaced fracture of the proximal right tibial metadiaphyseal junction. The fracture does not extend to the articular surface of the right knee.  2. Comminuted proximal fibular head fracture, with nondisplaced distal fibular diaphyseal fracture. 3. Widening of the ankle mortise laterally, which may reflect ligamentous injury. 4. Diffuse soft tissue edema. Electronically Signed   By: Sharlet Salina M.D.   On: 06/02/2020 20:16   DG Ankle Complete Right  Result Date: 06/02/2020 CLINICAL DATA:  Fall EXAM: RIGHT ANKLE - COMPLETE 3+ VIEW COMPARISON:  None. FINDINGS: There is an oblique fracture of the distal right fibula. No ankle dislocation or joint effusion. IMPRESSION: Oblique fracture of the distal right fibula. Electronically Signed   By: Deatra Robinson M.D.   On: 06/02/2020 21:29   DG Knee Complete 4 Views Right  Result Date: 06/02/2020 CLINICAL DATA:  Fall EXAM: RIGHT KNEE - COMPLETE 4+ VIEW COMPARISON:  None. FINDINGS: There is a comminuted fracture of proximal right tibia with lateral displacement. There is no intra-articular extension. The knee remains located. There is a comminuted fracture of the proximal fibula, also with mild lateral displacement. There is a distal fibular fracture which is demonstrated concomitant  tibia/fibular radiographs. No distal femoral fracture. No knee effusion. IMPRESSION: 1. Comminuted fracture of the proximal right tibia with lateral displacement. 2. Comminuted fracture of the proximal fibula with mild lateral displacement. Electronically Signed   By: Deatra Robinson M.D.   On: 06/02/2020 21:28   DG Tibia/Fibula Right Port  Result Date: 06/03/2020 CLINICAL DATA:  65 year old female with tibial fracture EXAM: PORTABLE RIGHT TIBIA AND FIBULA - 2 VIEW COMPARISON:  06/02/2020 FINDINGS: Interval surgical changes of tibial intramedullary rod with proximal and distal interlocking screws for treatment of tibial fracture. Alignment is relatively anatomic. Segmental fracture of the fibula, involving both the fibular head and distal fibula again noted. Soft tissue swelling throughout. Surgical staples project in the region  of the knee. Expected gas within the surgical site. IMPRESSION: Early/interval surgical changes of right tibial fracture ORIF, with intramedullary rod and interlocking screws as above. Segmental fibular fracture again noted. Electronically Signed   By: Gilmer Mor D.O.   On: 06/03/2020 11:25   DG C-Arm 1-60 Min-No Report  Result Date: 06/03/2020 Fluoroscopy was utilized by the requesting physician.  No radiographic interpretation.        Scheduled Meds: . docusate sodium  100 mg Oral BID  . [START ON 06/04/2020] enoxaparin (LOVENOX) injection  40 mg Subcutaneous Q24H  . [MAR Hold] ezetimibe  10 mg Oral Q supper  . [MAR Hold] ferrous sulfate  325 mg Oral BID WC  . HYDROmorphone      . HYDROmorphone      . [MAR Hold] lisinopril  30 mg Oral Daily  . [MAR Hold] oxybutynin  5 mg Oral Daily  . [MAR Hold] pantoprazole  40 mg Oral Daily  . [MAR Hold] pregabalin  150 mg Oral BID  . [MAR Hold] rOPINIRole  2 mg Oral BID  . [MAR Hold] zolpidem  5 mg Oral QHS   Continuous Infusions: . acetaminophen    . lactated ringers    . methocarbamol (ROBAXIN) IV    . vancomycin       LOS: 1 day    Time spent: 35 minutes    Ramiro Harvest, MD [facility]   To contact the attending provider between 7A-7P or the covering provider during after hours 7P-7A, please log into the web site [email or web address] and access using universal [place] password for that web site. If you do not have the password, please call the hospital operator.  06/03/2020, 1:15 PM

## 2020-06-03 NOTE — Progress Notes (Signed)
Received pt from ED. Pt A/ox4, VSS. No signs of distress noted. R Leg splint, ace wrapped. Oriented pt to room and the use of the call bell, placed all items within reach. Assessment completed, will continue with the plan of care.

## 2020-06-03 NOTE — Op Note (Signed)
06/03/2020  10:45 AM  PATIENT:  Heidi Green    PRE-OPERATIVE DIAGNOSIS:  Comminuted right proximal tibia fracture. Right fibular neck fracture. Right distal fibula shaft fracture. Possible syndesmotic injury.  POST-OPERATIVE DIAGNOSIS:  Comminuted right proximal tibia fracture. Right fibular neck fracture. Right distal fibula shaft fracture.  PROCEDURE:  1.  Intramedullary fixation of right tibia. 2.  Live external rotation stress testing of right ankle under fluoroscopy. 3.  Interpretation of fluoroscopic images.  SURGEON:  Jonette Pesa, MD  PHYSICIAN ASSISTANT: Barrie Dunker, PA-C, present and scrubbed throughout the case, critical for completion in a timely fashion, and for retraction, instrumentation, and closure.  ANESTHESIA:   General  ANTIBIOTICS: 1.5 g vancomycin.  IMPLANTS: [employer] versa nail tibial nail, size 11 x 315 mm with 5.5 bicortical proximal interlocking screw x3 and 4.5 mm bicortical distal interlocking screw x2.  ESTIMATED BLOOD LOSS: 250 mL.  COMPLICATIONS: None  DISPOSITION: Stable to PACU.  PREOPERATIVE INDICATIONS:  Heidi Green is a  65 y.o. female who sustained a ground-level fall while transferring off of her bedside commode 6 days ago, sustaining an injury to the right lower extremity.  She is a nonambulator and weightbears only for transfers.  She had increasing pain and swelling of the right lower extremity.  She saw her primary care doctor yesterday, and x-rays revealed tibia fracture.  She was then sent to the emergency department at [facility].  X-rays of the knee, tib-fib, and ankle were obtained revealing a comminuted proximal tibial metaphyseal fracture, fibular neck fracture, and distal fibula fracture with questionable syndesmotic injury.  She was admitted to the hospitalist service for perioperative or stratification and medical optimization.  She was indicated for surgical fixation.  We discussed the risk, benefits, and  alternatives to intramedullary fixation of the tibia, and she elected to proceed.  The risks, benefits, and alternatives were discussed with the patient. There are risks associated with the surgery including, but not limited to, problems with anesthesia (death), infection, differences in leg length/angulation/rotation, fracture of bones, loosening or failure of implants, malunion, nonunion, hematoma (blood accumulation) which may require surgical drainage, blood clots, pulmonary embolism, nerve injury (foot drop), and blood vessel injury. The patient understands these risks and elects to proceed.  OPERATIVE PROCEDURE: The patient was identified in the holding area using 2 identifiers.  The surgical site was marked by myself.  She was taken to the operating room, and general anesthesia was induced on her bed.  Foley catheter was placed.  She was then transferred to the operating room table.  All bony prominences were well-padded.  A bump was placed under the operative hip.  She was positioned on a bone foam positioner.  The right lower extremity was prepped and draped in the normal sterile surgical fashion.  Timeout was called, verifying site and site of surgery.  Preoperative antibiotics were given within 60 minutes of beginning the surgery.  I began by using fluoroscopy to define her anatomy.  A longitudinal incision was created over the lateral border of the patella.  Limited lateral peripatellar arthrotomy was performed.  Using digital dissection, I developed the interval between the patellar tendon and prepatellar fat pad.  An awl was used to determine the appropriate starting point for a tibial nail.  Under AP and lateral fluoroscopic guidance, a guidepin was placed into the proximal tibia.  Opening reamer was then used.  The fracture was reduced with longitudinal traction.  A guidewire was placed to the physeal  scar of the tibia.  I measured the length of the nail.  We purposely left the nail about 2 cm  short in case we needed to do syndesmotic fixation later in the case.  I sequentially reamed up to a 13 mm reamer with chatter.  Therefore, an 11 x 315 mm nail was selected and assembled onto the jig.  The nail was passed without any difficulty.  The guidewire was removed.  2 oblique and 1 transverse 5.5 mm bicortical screws were placed proximally.  Perfect circles were then obtained distally.  Using standard AO technique, a total of 2 4.5 mm distal bicortical screws were placed.  AP and lateral fluoroscopy views were used to confirm acceptable reduction of the fracture and placement of the hardware.  I then obtained mortise radiograph of the ankle.  Under live fluoroscopy, external rotation stress test was performed.  There was no lateral shifting of the talus or medial clear space widening.  Therefore, syndesmotic fixation was not needed.  The wounds were copiously irrigated with saline.  The stab incisions were closed with 2-0 nylon due to her fragile skin.  The arthrotomy was closed with #1 Vicryl and #1 strata fix, 2-0 Vicryl for the deep dermal layer, and staples for the skin.  Sterile dressing with Adaptic, 4 x 4's, ABDs, cast padding, and Ace wrap were applied.  The patient was then awakened from anesthesia and taken to the PACU in stable condition.  Sponge, needle, and instrument counts were correct at the end of the case x2.  There were no known complications.  POSTOPERATIVE PLAN: The patient will be readmitted to the hospitalist.  Nonweightbearing right lower extremity.  Mobilize out of bed with PT and OT.  Begin Lovenox for DVT prophylaxis.  She will undergo disposition planning.  Return to the office in 2 weeks for repeat radiographs of the tibia and suture removal.

## 2020-06-03 NOTE — Anesthesia Procedure Notes (Signed)
Procedure Name: Intubation Date/Time: 06/03/2020 8:28 AM Performed by: Gerald Leitz, CRNA Pre-anesthesia Checklist: Patient identified, Patient being monitored, Timeout performed, Emergency Drugs available and Suction available Patient Re-evaluated:Patient Re-evaluated prior to induction Oxygen Delivery Method: Circle system utilized Preoxygenation: Pre-oxygenation with 100% oxygen Induction Type: IV induction Ventilation: Mask ventilation without difficulty Laryngoscope Size: Mac and 3 Grade View: Grade I Tube type: Oral Tube size: 7.0 mm Number of attempts: 1 Placement Confirmation: ETT inserted through vocal cords under direct vision,  positive ETCO2 and breath sounds checked- equal and bilateral Secured at: 21 cm Tube secured with: Tape Dental Injury: Teeth and Oropharynx as per pre-operative assessment

## 2020-06-03 NOTE — Transfer of Care (Signed)
Immediate Anesthesia Transfer of Care Note  Patient: Heidi Green  Procedure(s) Performed: Procedure(s): OPEN REDUCTION INTERNAL FIXATION (ORIF) TIBIA FRACTURE (Right)  Patient Location: PACU  Anesthesia Type:General  Level of Consciousness: Alert, Awake, Oriented  Airway & Oxygen Therapy: Patient Spontanous Breathing  Post-op Assessment: Report given to RN  Post vital signs: Reviewed and stable  Last Vitals:  Vitals:   06/03/20 0233 06/03/20 0643  BP: 137/64 (!) 143/83  Pulse: 74 70  Resp: 20 16  Temp: 36.7 C (!) 36.4 C  SpO2: 96% 97%    Complications: No apparent anesthesia complications

## 2020-06-03 NOTE — Anesthesia Postprocedure Evaluation (Signed)
Anesthesia Post Note  Patient: EDIA PURSIFULL  Procedure(s) Performed: OPEN REDUCTION INTERNAL FIXATION (ORIF) TIBIA FRACTURE (Right Leg Lower)     Patient location during evaluation: PACU Anesthesia Type: General Level of consciousness: awake and alert Pain management: pain level controlled Vital Signs Assessment: post-procedure vital signs reviewed and stable Respiratory status: spontaneous breathing, nonlabored ventilation, respiratory function stable and patient connected to nasal cannula oxygen Cardiovascular status: blood pressure returned to baseline and stable Postop Assessment: no apparent nausea or vomiting Anesthetic complications: no   No complications documented.  Last Vitals:  Vitals:   06/03/20 1145 06/03/20 1200  BP: 131/68 125/75  Pulse: 63 60  Resp: 14 14  Temp:  36.6 C  SpO2: 97% 95%    Last Pain:  Vitals:   06/03/20 1200  TempSrc:   PainSc: Asleep                 Shelton Silvas

## 2020-06-03 NOTE — Progress Notes (Signed)
Pt refused nocturnal cpap tonight.  Pt was advised that RT is available all night should she change her mind.

## 2020-06-03 NOTE — Discharge Instructions (Signed)
Dr. Arlys John [facility] [address], [address] [place], [place] [address] [phone]   POSTOPERATIVE DIRECTIONS    Rehabilitation, Guidelines Following Surgery   WEIGHT BEARING Other:  Non weight bearing right leg   HOME CARE INSTRUCTIONS  Remove items at home which could result in a fall. This includes throw rugs or furniture in walking pathways.  Continue medications as instructed at time of discharge.  You may have some home medications which will be placed on hold until you complete the course of blood thinner medication.  4 days after discharge, you may start showering. No tub baths or soaking your incisions. Do not put on socks or shoes without following the instructions of your caregivers.   Sit on chairs with arms. Use the chair arms to help push yourself up when arising.  Arrange for the use of a toilet seat elevator so you are not sitting low.   Walk with walker as instructed.  You may resume a sexual relationship in one month or when given the OK by your caregiver.  Use walker as long as suggested by your caregivers.  Avoid periods of inactivity such as sitting longer than an hour when not asleep. This helps prevent blood clots.  You may return to work once you are cleared by Designer, industrial/product.  Do not drive a car for 6 weeks or until released by your surgeon.  Do not drive while taking narcotics.  Wear elastic stockings for two weeks following surgery during the day but you may remove then at night.  Make sure you keep all of your appointments after your operation with all of your doctors and caregivers. You should call the office at the above phone number and make an appointment for approximately two weeks after the date of your surgery. Please pick up a stool softener and laxative for home use as long as you are requiring pain medications.  ICE to the affected hip every three hours for 30 minutes at a time and then as needed for pain and  swelling. Continue to use ice on the hip for pain and swelling from surgery. You may notice swelling that will progress down to the foot and ankle.  This is normal after surgery.  Elevate the leg when you are not up walking on it.   It is important for you to complete the blood thinner medication as prescribed by your doctor.  Continue to use the breathing machine which will help keep your temperature down.  It is common for your temperature to cycle up and down following surgery, especially at night when you are not up moving around and exerting yourself.  The breathing machine keeps your lungs expanded and your temperature down.  RANGE OF MOTION AND STRENGTHENING EXERCISES  These exercises are designed to help you keep full movement of your hip joint. Follow your caregiver's or physical therapist's instructions. Perform all exercises about fifteen times, three times per day or as directed. Exercise both hips, even if you have had only one joint replacement. These exercises can be done on a training (exercise) mat, on the floor, on a table or on a bed. Use whatever works the best and is most comfortable for you. Use music or television while you are exercising so that the exercises are a pleasant break in your day. This will make your life better with the exercises acting as a break in routine you can look forward to.  Lying on your back, slowly slide your  foot toward your buttocks, raising your knee up off the floor. Then slowly slide your foot back down until your leg is straight again.  Lying on your back spread your legs as far apart as you can without causing discomfort.  Lying on your side, raise your upper leg and foot straight up from the floor as far as is comfortable. Slowly lower the leg and repeat.  Lying on your back, tighten up the muscle in the front of your thigh (quadriceps muscles). You can do this by keeping your leg straight and trying to raise your heel off the floor. This helps  strengthen the largest muscle supporting your knee.  Lying on your back, tighten up the muscles of your buttocks both with the legs straight and with the knee bent at a comfortable angle while keeping your heel on the floor.   SKILLED REHAB INSTRUCTIONS: If the patient is transferred to a skilled rehab facility following release from the hospital, a list of the current medications will be sent to the facility for the patient to continue.  When discharged from the skilled rehab facility, please have the facility set up the patient's Home Health Physical Therapy prior to being released. Also, the skilled facility will be responsible for providing the patient with their medications at time of release from the facility to include their pain medication and their blood thinner medication. If the patient is still at the rehab facility at time of the two week follow up appointment, the skilled rehab facility will also need to assist the patient in arranging follow up appointment in our office and any transportation needs.  MAKE SURE YOU:  Understand these instructions.  Will watch your condition.  Will get help right away if you are not doing well or get worse.  Pick up stool softner and laxative for home use following surgery while on pain medications. Daily dry dressing changes as needed. In 4 days, you may remove your dressings and begin taking showers - no tub baths or soaking the incisions. Continue to use ice for pain and swelling after surgery. Do not use any lotions or creams on the incision until instructed by your surgeon.

## 2020-06-03 NOTE — Consult Note (Signed)
ORTHOPAEDIC CONSULTATION  REQUESTING PHYSICIAN: Rodolph Bong, MD  PCP:  Chiquita Loth, PA  Chief Complaint: right lower extremity injury  HPI: Heidi Green is a 65 y.o. female who complains of right lower extremity injury that happened about 6 days ago.  The patient is a nonambulator.  She weightbears only for transferring.  She is dependent on a motorized wheelchair.  She lost her balance and fell last Sunday while trying to get up from her bedside commode.  She has pain in the right proximal lower leg.  She has altered sensation in her extremities at baseline, and she states it is no worse than usual.  She has a history of intracerebral hemorrhage in 2018, hypertension, obesity, hyperlipidemia, chronic left lower extremity wounds, OSA on CPAP, and chronic pain syndrome.  She denies other injuries.  She presented to the emergency department last night.  X-rays revealed comminuted right proximal tibia fracture, questionable syndesmotic injury.  She was placed into a long-leg splint.  Orthopedic consultation was placed for management of her lower extremity injury.  She was admitted to the hospitalist service for perioperative risk ratification and medical optimization.  Past Medical History:  Diagnosis Date  . Arthritis    in back  . Back pain   . Chest pain 06/16/2012   negative cath with normal LV function  . Headache(784.0)   . Hypertension   . 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 Kidney stones   . Sleep apnea   . Stroke [facility])    Past Surgical History:  Procedure Laterality Date  . ABDOMINAL HYSTERECTOMY    . BACK SURGERY     x 3  . Left ACL repair  2004  . LEFT HEART CATHETERIZATION WITH CORONARY ANGIOGRAM N/A 06/16/2012   Procedure: LEFT HEART CATHETERIZATION WITH CORONARY ANGIOGRAM;  Surgeon: Peter M Swaziland, MD;  Location: [facility] CATH LAB;  Service: Cardiovascular;  Laterality: N/A;  . lipoma excision from neck  2006  .  LITHOTRIPSY    . TUBAL LIGATION     Social History   Socioeconomic History  . Marital status: Married    Spouse name: Brett Canales  . Number of children: 2  . Years of education: 53  . Highest education level: Not on file  Occupational History    Comment: retired LPN  Tobacco Use  . Smoking status: Former Smoker    Packs/day: 0.50    Years: 26.00    Pack years: 13.00    Types: Cigarettes    Quit date: 06/17/2012    Years since quitting: 7.9  . Smokeless tobacco: Never Used  Substance and Sexual Activity  . Alcohol use: No  . Drug use: No  . Sexual activity: Not on file  Other Topics Concern  . Not on file  Social History Narrative   Lives at home with husband   Social Determinants of Health   Financial Resource Strain: Not on file  Food Insecurity: Not on file  Transportation Needs: Not on file  Physical Activity: Not on file  Stress: Not on file  Social Connections: Not on file   Family History  Problem Relation Age of Onset  . Emphysema Mother   . Diabetes Mother    Allergies  Allergen Reactions  . Buprenorphine Itching  . Garamycin [Gentamicin Sulfate] Hives and Shortness Of Breath  . Other Anaphylaxis    Reaction to HORSE SERUM  . Penicillins Anaphylaxis, Itching and Swelling  Swelling and shortness of breath after taking penicillin for a tooth infection when patient was 24. Per her account, she believes that she has taken amoxicillin and cephalosporins since that time and had no issues.   . Atorvastatin Nausea And Vomiting  . Norvasc [Amlodipine Besylate] Nausea And Vomiting  . Rosuvastatin Diarrhea and Other (See Comments)    Spasms in hand    Prior to Admission medications   Medication Sig Start Date End Date Taking? Authorizing Provider  albuterol (VENTOLIN HFA) 108 (90 Base) MCG/ACT inhaler Inhale 2 puffs into the lungs every 6 (six) hours as needed for wheezing or shortness of breath. 12/22/19  Yes Rhetta Mura, MD  diclofenac Sodium (VOLTAREN) 1  % GEL Apply 2 g topically 4 (four) times daily. 05/24/20  Yes [provider]  esomeprazole (NEXIUM) 40 MG capsule Take 40 mg by mouth daily with supper.  08/18/18  Yes [provider]  ezetimibe (ZETIA) 10 MG tablet Take 10 mg by mouth daily with supper. 11/11/19  Yes [provider]  FEROSUL 325 (65 Fe) MG tablet Take 325 mg by mouth 2 (two) times daily with a meal.  10/15/19  Yes [provider]  HYDROcodone-acetaminophen (NORCO) 10-325 MG tablet Take 1 tablet by mouth 4 (four) times daily as needed for moderate pain or severe pain. 06/02/20  Yes [provider]  lisinopril (ZESTRIL) 30 MG tablet Take 30 mg by mouth daily. 02/10/20  Yes [provider]  oxybutynin (DITROPAN-XL) 5 MG 24 hr tablet Take 5 mg by mouth daily. 01/08/20  Yes [provider]  pregabalin (LYRICA) 150 MG capsule Take 150 mg by mouth 2 (two) times daily.   Yes [provider]  rOPINIRole (REQUIP) 2 MG tablet Take 2 mg by mouth 2 (two) times daily.    Yes [provider]  Vitamin D, Ergocalciferol, (DRISDOL) 50000 units CAPS capsule Take 50,000 Units by mouth every Sunday.  10/31/17  Yes [provider]  zolpidem (AMBIEN) 10 MG tablet Take 5 mg by mouth at bedtime. 12/10/19  Yes [provider]  predniSONE (STERAPRED UNI-PAK 21 TAB) 10 MG (21) TBPK tablet Take by mouth. 06/02/20   [provider]   DG Tibia/Fibula Right  Result Date: 06/02/2020 CLINICAL DATA:  Twisting injury 5 days ago, pain and swelling, bruising EXAM: RIGHT TIBIA AND FIBULA - 2 VIEW COMPARISON:  12/19/2019 FINDINGS: Frontal and lateral views of the right tibia and fibula are obtained. There is a comminuted displaced proximal tibial metadiaphyseal fracture with mild distraction of the fracture fragments. The fracture line does not extend to the joint space of the right knee. There is a comminuted displaced fracture of the proximal fibular head, moderately  displaced. A nondisplaced oblique fracture of the distal fibular diaphysis is also noted. There is diffuse soft tissue edema of the right lower extremity. Right knee is well aligned. There is slight widening of the ankle mortise laterally which may reflect ligamentous injury. IMPRESSION: 1. Comminuted minimally displaced fracture of the proximal right tibial metadiaphyseal junction. The fracture does not extend to the articular surface of the right knee. 2. Comminuted proximal fibular head fracture, with nondisplaced distal fibular diaphyseal fracture. 3. Widening of the ankle mortise laterally, which may reflect ligamentous injury. 4. Diffuse soft tissue edema. Electronically Signed   By: Sharlet Salina M.D.   On: 06/02/2020 20:16   DG Ankle Complete Right  Result Date: 06/02/2020 CLINICAL DATA:  Fall EXAM: RIGHT ANKLE - COMPLETE 3+ VIEW COMPARISON:  None.  FINDINGS: There is an oblique fracture of the distal right fibula. No ankle dislocation or joint effusion. IMPRESSION: Oblique fracture of the distal right fibula. Electronically Signed   By: Deatra Robinson M.D.   On: 06/02/2020 21:29   DG Knee Complete 4 Views Right  Result Date: 06/02/2020 CLINICAL DATA:  Fall EXAM: RIGHT KNEE - COMPLETE 4+ VIEW COMPARISON:  None. FINDINGS: There is a comminuted fracture of proximal right tibia with lateral displacement. There is no intra-articular extension. The knee remains located. There is a comminuted fracture of the proximal fibula, also with mild lateral displacement. There is a distal fibular fracture which is demonstrated concomitant tibia/fibular radiographs. No distal femoral fracture. No knee effusion. IMPRESSION: 1. Comminuted fracture of the proximal right tibia with lateral displacement. 2. Comminuted fracture of the proximal fibula with mild lateral displacement. Electronically Signed   By: Deatra Robinson M.D.   On: 06/02/2020 21:28    Positive ROS: All other systems have been reviewed and were  otherwise negative with the exception of those mentioned in the HPI and as above.  Physical Exam: General: Alert, no acute distress Cardiovascular: No pedal edema Respiratory: No cyanosis, no use of accessory musculature GI: No organomegaly, abdomen is soft and non-tender Skin: No lesions in the area of chief complaint Neurologic: Sensation intact distally Psychiatric: Patient is competent for consent with normal mood and affect Lymphatic: No axillary or cervical lymphadenopathy  MUSCULOSKELETAL: Examination of the right lower extremity reveals that she is in a long-leg splint.  She has pedal edema bilaterally.  She has a faintly palpable pulse.  The foot is warm and well-perfused.  She reports subjective sensory change stocking glove distribution which is her baseline.  She has positive motor function dorsiflexion, plantarflexion, and great toe extension.  Assessment: Right proximal tibia fracture. Questionable syndesmotic injury. Nonambulator. Multiple medical problems.  Plan: I discussed the findings with the patient.  She has an unstable long bone fracture that requires surgical stabilization.  We discussed the risk, benefits, and alternatives to intramedullary fixation of the right tibia.  Plan to perform live intraoperative stress test, possible syndesmotic fixation if indicated.  Plan for surgery today.  All questions solicited and answered.  The risks, benefits, and alternatives were discussed with the patient. There are risks associated with the surgery including, but not limited to, problems with anesthesia (death), infection, differences in leg length/angulation/rotation, fracture of bones, loosening or failure of implants, malunion, nonunion, hematoma (blood accumulation) which may require surgical drainage, blood clots, pulmonary embolism, nerve injury (foot drop), and blood vessel injury. The patient understands these risks and elects to proceed.   Jonette Pesa, MD [phone]    06/03/2020 8:07 AM

## 2020-06-03 NOTE — Anesthesia Preprocedure Evaluation (Signed)
Anesthesia Evaluation  Patient identified by MRN, date of birth, ID band Patient awake    Reviewed: Allergy & Precautions, NPO status , Patient's Chart, lab work & pertinent test results  Airway Mallampati: III  TM Distance: >3 FB Neck ROM: Full    Dental  (+) Edentulous Upper, Edentulous Lower   Pulmonary sleep apnea , former smoker,    breath sounds clear to auscultation       Cardiovascular hypertension, Pt. on medications  Rhythm:Regular Rate:Normal     Neuro/Psych  Headaches, CVA, Residual Symptoms negative psych ROS   GI/Hepatic Neg liver ROS, GERD  Medicated,  Endo/Other  negative endocrine ROS  Renal/GU Renal disease     Musculoskeletal  (+) Arthritis ,   Abdominal (+) + obese,   Peds  Hematology negative hematology ROS (+)   Anesthesia Other Findings   Reproductive/Obstetrics                             Anesthesia Physical Anesthesia Plan  ASA: III  Anesthesia Plan: General   Post-op Pain Management:    Induction: Intravenous  PONV Risk Score and Plan: 4 or greater and Ondansetron, Dexamethasone, Amisulpride and Treatment may vary due to age or medical condition  Airway Management Planned: Oral ETT  Additional Equipment: None  Intra-op Plan:   Post-operative Plan: Extubation in OR  Informed Consent: I have reviewed the patients History and Physical, chart, labs and discussed the procedure including the risks, benefits and alternatives for the proposed anesthesia with the patient or authorized representative who has indicated his/her understanding and acceptance.     Dental advisory given  Plan Discussed with: CRNA  Anesthesia Plan Comments:         Anesthesia Quick Evaluation

## 2020-06-04 ENCOUNTER — Inpatient Hospital Stay (HOSPITAL_COMMUNITY): Payer: Medicare HMO

## 2020-06-04 DIAGNOSIS — G4733 Obstructive sleep apnea (adult) (pediatric): Secondary | ICD-10-CM | POA: Diagnosis not present

## 2020-06-04 DIAGNOSIS — E785 Hyperlipidemia, unspecified: Secondary | ICD-10-CM | POA: Diagnosis not present

## 2020-06-04 DIAGNOSIS — I10 Essential (primary) hypertension: Secondary | ICD-10-CM | POA: Diagnosis not present

## 2020-06-04 DIAGNOSIS — D62 Acute posthemorrhagic anemia: Secondary | ICD-10-CM

## 2020-06-04 DIAGNOSIS — S82201P Unspecified fracture of shaft of right tibia, subsequent encounter for closed fracture with malunion: Secondary | ICD-10-CM | POA: Diagnosis not present

## 2020-06-04 DIAGNOSIS — D72829 Elevated white blood cell count, unspecified: Secondary | ICD-10-CM

## 2020-06-04 LAB — URINALYSIS, ROUTINE W REFLEX MICROSCOPIC
Bilirubin Urine: NEGATIVE
Glucose, UA: NEGATIVE mg/dL
Hgb urine dipstick: NEGATIVE
Ketones, ur: NEGATIVE mg/dL
Leukocytes,Ua: NEGATIVE
Nitrite: NEGATIVE
Protein, ur: 30 mg/dL — AB
Specific Gravity, Urine: 1.03 (ref 1.005–1.030)
pH: 5 (ref 5.0–8.0)

## 2020-06-04 LAB — BASIC METABOLIC PANEL
Anion gap: 8 (ref 5–15)
BUN: 14 mg/dL (ref 8–23)
CO2: 24 mmol/L (ref 22–32)
Calcium: 8.4 mg/dL — ABNORMAL LOW (ref 8.9–10.3)
Chloride: 107 mmol/L (ref 98–111)
Creatinine, Ser: 0.64 mg/dL (ref 0.44–1.00)
GFR, Estimated: >60 mL/min (ref >60)
Glucose, Bld: 139 mg/dL — ABNORMAL HIGH (ref 70–99)
Potassium: 3.9 mmol/L (ref 3.5–5.1)
Sodium: 139 mmol/L (ref 135–145)

## 2020-06-04 LAB — CBC WITH DIFFERENTIAL/PLATELET
Abs Immature Granulocytes: 0.14 10*3/uL — ABNORMAL HIGH (ref 0.00–0.07)
Basophils Absolute: 0 10*3/uL (ref 0.0–0.1)
Basophils Relative: 0 %
Eosinophils Absolute: 0 10*3/uL (ref 0.0–0.5)
Eosinophils Relative: 0 %
HCT: 33 % — ABNORMAL LOW (ref 36.0–46.0)
Hemoglobin: 10.4 g/dL — ABNORMAL LOW (ref 12.0–15.0)
Immature Granulocytes: 1 %
Lymphocytes Relative: 14 %
Lymphs Abs: 2 10*3/uL (ref 0.7–4.0)
MCH: 29.1 pg (ref 26.0–34.0)
MCHC: 31.5 g/dL (ref 30.0–36.0)
MCV: 92.2 fL (ref 80.0–100.0)
Monocytes Absolute: 0.6 10*3/uL (ref 0.1–1.0)
Monocytes Relative: 5 %
Neutro Abs: 11 10*3/uL — ABNORMAL HIGH (ref 1.7–7.7)
Neutrophils Relative %: 80 %
Platelets: 287 10*3/uL (ref 150–400)
RBC: 3.58 MIL/uL — ABNORMAL LOW (ref 3.87–5.11)
RDW: 15.2 % (ref 11.5–15.5)
WBC: 13.7 10*3/uL — ABNORMAL HIGH (ref 4.0–10.5)
nRBC: 0 % (ref 0.0–0.2)

## 2020-06-04 NOTE — Progress Notes (Signed)
Patient ID: Heidi Green, female   DOB: [birth year], 65 y.o.   MRN: [id number] Subjective: 1 Day Post-Op Procedure(s) (LRB): OPEN REDUCTION INTERNAL FIXATION (ORIF) TIBIA FRACTURE (Right)    Patient reports pain as mild thus far post-op but no OOB activity yet.  Objective:   VITALS:   Vitals:   06/04/20 0153 06/04/20 0610  BP: (!) 101/45 (!) 107/51  Pulse: 84 70  Resp: 16 16  Temp: 98 F (36.7 C) 97.8 F (36.6 C)  SpO2: 94% 95%    Incision: dressing C/D/I - bulky RLE dressing in place Baseline weakness of RLE Some edema in right foot ?baseline   LABS Recent Labs    06/02/20 2140 06/03/20 0352 06/04/20 0329  HGB 12.3 11.6* 10.4*  HCT 38.3 37.0 33.0*  WBC 12.5* 10.9* 13.7*  PLT 290 279 287    Recent Labs    06/02/20 2140 06/03/20 0352 06/04/20 0329  NA 140 143 139  K 3.1* 3.1* 3.9  BUN 14 16 14   CREATININE 0.64 0.59 0.64  GLUCOSE 104* 95 139*    No results for input(s): LABPT, INR in the last 72 hours.   Assessment/Plan: 1 Day Post-Op Procedure(s) (LRB): OPEN REDUCTION INTERNAL FIXATION (ORIF) TIBIA FRACTURE (Right)   Advance diet Up with therapy - as directed by [place] Plan for dressing change likely tomorrow Patient wants to go home perhaps tomorrow if determined safe and at near pre-op baseline activity Medical management of electrolytes per Hospitalist

## 2020-06-04 NOTE — Plan of Care (Signed)
°  Problem: Clinical Measurements: °Goal: Diagnostic test results will improve °Outcome: Progressing °  °Problem: Clinical Measurements: °Goal: Respiratory complications will improve °Outcome: Progressing °  °Problem: Clinical Measurements: °Goal: Cardiovascular complication will be avoided °Outcome: Progressing °  °Problem: Activity: °Goal: Risk for activity intolerance will decrease °Outcome: Progressing °  °Problem: Nutrition: °Goal: Adequate nutrition will be maintained °Outcome: Progressing °  °

## 2020-06-04 NOTE — Progress Notes (Signed)
Pt hasnt voided yet since foley removed at 1300; pt was encouraged to drink fluid and finished of water; pt stated that shes been drinking fluid ok; VSS; bladder scanned shows ; provider on call made aware. Will continue to monitor pt.

## 2020-06-04 NOTE — Evaluation (Signed)
Occupational Therapy Evaluation Patient Details Name: Heidi Green MRN: [id number] DOB: 09-12-1955 Today's Date: 06/04/2020    History of Present Illness 65 y.o. female with PMH significant of intracerebral hemorrhage in 2018, hypertension, hyperlipidemia, chronic lower extremity wounds, OSA on CPAP, chronic pain syndrome, obesity (BMI 38.40) presenting to the ED from her PCPs office with a reported right tib-fib fracture. Pt reports fall from [facility] on 2/13 with resulting LE pain. Pt s/p Intramedullary fixation of right tibia.   Clinical Impression   Patient lives at home with spouse in a 2 story house with ramp entry. Patient has stair lift to go upstairs, power scooter and manual wheelchair. Pt is w/c bound, at baseline able to transfer mod I and complete BADLs with assist from spouse "when I needed he helped a little." Some difficulty understanding patient trying to describe a small step to get up despite having ramp "I don't know how I'm going to get into the house, but we will figure it out." Patient has poor insight to her current deficits needing mod x2 to complete stand pivot to recliner chair with rehab tech blocking R LE to maintain WB restrictions. Patient very unsafe with transfer, OT providing max cues for set up of walker and hand placement to maximize safety however patient particular wanting to transfer "my way." Recommend continued acute OT services to maximize patient safety and independence with self care in order to facilitate D/C to venue listed below.    Follow Up Recommendations  SNF;Other (comment) (vs HH and 24/7 if patient declines SNF)    Equipment Recommendations  None recommended by OT (has all DME)       Precautions / Restrictions Precautions Precautions: Fall Restrictions Weight Bearing Restrictions: Yes RLE Weight Bearing: Non weight bearing      Mobility Bed Mobility Overal bed mobility: Needs Assistance Bed Mobility: Supine to Sit     Supine to sit: Mod  assist;HOB elevated     General bed mobility comments: increased time and mod A for R LE management    Transfers Overall transfer level: Needs assistance Equipment used: Rolling walker (2 wheeled) Transfers: Stand Pivot Transfers   Stand pivot transfers: Mod assist;+2 physical assistance;+2 safety/equipment;From elevated surface       General transfer comment: please see toilet transfer in ADL section, pt high fall risk not using walker safely. had rehab tech block R LE and reports did not bear much weight through R LE "just hovered the leg"    Balance Overall balance assessment: History of Falls;Needs assistance Sitting-balance support: Feet supported Sitting balance-Leahy Scale: Fair     Standing balance support: Bilateral upper extremity supported Standing balance-Leahy Scale: Zero Standing balance comment: mod A x2 and heavy reliance of UE support                           ADL either performed or assessed with clinical judgement   ADL Overall ADL's : Needs assistance/impaired Eating/Feeding: Independent;Sitting   Grooming: Set up;Sitting   Upper Body Bathing: Supervision/ safety;Set up;Sitting   Lower Body Bathing: Maximal assistance;Sitting/lateral leans;Bed level   Upper Body Dressing : Set up;Sitting   Lower Body Dressing: Total assistance;Bed level Lower Body Dressing Details (indicate cue type and reason): to don B socks Toilet Transfer: Moderate assistance;+2 for physical assistance;+2 for safety/equipment;Stand-pivot;Cueing for safety;Cueing for sequencing;BSC;RW Toilet Transfer Details (indicate cue type and reason): poor safety awareness, patient holding R UE on L side of walker to try  and balance herself upon initial stand from EOB. provided max cues and suggestions to maximize safety with transfer including reaching L UE to far chair arm in order to pivot safely however patient is particular and insistent on doing it her way Toileting- Clothing  Manipulation and Hygiene: Total assistance       Functional mobility during ADLs: Moderate assistance;+2 for physical assistance;+2 for safety/equipment;Cueing for safety;Cueing for sequencing;Rolling walker General ADL Comments: patient requiring increased assistance for self care tasks due to pain, WB restrictions, decreased balance, activity tolerance and safety awareness                  Pertinent Vitals/Pain Pain Assessment: Faces Faces Pain Scale: Hurts even more Pain Location: R LE Pain Descriptors / Indicators: Moaning;Grimacing Pain Intervention(s): Monitored during session;Premedicated before session;Patient requesting pain meds-RN notified     Hand Dominance Left (due to CVA, was R handed)   Extremity/Trunk Assessment Upper Extremity Assessment Upper Extremity Assessment: RUE deficits/detail;LUE deficits/detail RUE Deficits / Details: hx CVA grossly 3+/5 LUE Deficits / Details: grossly intact AROM and strength 4+/5   Lower Extremity Assessment Lower Extremity Assessment: Defer to PT evaluation       Communication Communication Communication: Expressive difficulties   Cognition Arousal/Alertness: Awake/alert Behavior During Therapy: WFL for tasks assessed/performed Overall Cognitive Status: No family/caregiver present to determine baseline cognitive functioning                                 General Comments: poor insight to deficits; patient with word finding difficulty, first stating she broke her hip then when asked about it "I broke my leg" patient is also unsafe, is particular about her transfer but does not use walker safely or heed OT's instructions to maximize safety              Home Living Family/patient expects to be discharged to:: Private residence Living Arrangements: Spouse/significant other Available Help at Discharge: Family;Available 24 hours/day Type of Home: House Home Access: Ramped entrance     Home Layout: Two  level;Bed/bath upstairs     Bathroom Shower/Tub: Tub/shower unit         Home Equipment: Walker - 2 wheels;Hospital bed;Bedside commode;Tub bench (stair glide/lift, slide board)          Prior Functioning/Environment Level of Independence: Needs assistance  Gait / Transfers Assistance Needed: nonambulatory. per pt, she was able to perform transfers to/from [facility] with Mod Ind ADL's / Homemaking Assistance Needed: pt reports being mod I with simple ADL activity including toileting, however pt states husband "helps a little when I need help"   Comments: unsure of PLOF/home equipment as patient difficult to follow at times        OT Problem List: Decreased strength;Decreased activity tolerance;Impaired balance (sitting and/or standing);Decreased cognition;Decreased safety awareness;Decreased knowledge of use of DME or AE;Obesity;Pain      OT Treatment/Interventions: Therapeutic exercise;Self-care/ADL training;DME and/or AE instruction;Therapeutic activities;Cognitive remediation/compensation;Patient/family education;Balance training    OT Goals(Current goals can be found in the care plan section) Acute Rehab OT Goals Patient Stated Goal: "my husband and I can do this at home" OT Goal Formulation: With patient Time For Goal Achievement: 06/18/20 Potential to Achieve Goals: Good  OT Frequency: Min 2X/week    AM-PAC OT "6 Clicks" Daily Activity     Outcome Measure Help from another person eating meals?: None Help from another person taking care of personal grooming?: A Little Help  from another person toileting, which includes using toliet, bedpan, or urinal?: Total Help from another person bathing (including washing, rinsing, drying)?: A Lot Help from another person to put on and taking off regular upper body clothing?: A Little Help from another person to put on and taking off regular lower body clothing?: Total 6 Click Score: 14   End of Session Equipment Utilized During  Treatment: Gait belt;Rolling walker Nurse Communication: Mobility status  Activity Tolerance: Patient limited by pain;Patient limited by fatigue Patient left: in chair;with call bell/phone within reach;with chair alarm set  OT Visit Diagnosis: Other abnormalities of gait and mobility (R26.89);Muscle weakness (generalized) (M62.81);History of falling (Z91.81);Pain;Other symptoms and signs involving cognitive function Pain - Right/Left: Right Pain - part of body: Leg                Time: 0272-5366 OT Time Calculation (min): 30 min Charges:  OT General Charges $OT Visit: 1 Visit OT Evaluation $OT Eval Moderate Complexity: 1 Mod OT Treatments $Self Care/Home Management : 8-22 mins  Marlyce Huge OT OT pager: [phone]  Carmelia Roller 06/04/2020, 11:15 AM

## 2020-06-04 NOTE — Evaluation (Signed)
Physical Therapy Evaluation Patient Details Name: Heidi Green MRN: [id number] DOB: [birth year] Today's Date: 06/04/2020   History of Present Illness  65 y.o. female with PMH significant of intracerebral hemorrhage in 2018, hypertension, hyperlipidemia, chronic lower extremity wounds, OSA on CPAP, chronic pain syndrome, obesity (BMI 38.40) presenting to the ED from her PCPs office with a reported right tib-fib fracture. Pt reports fall from [facility] on 2/13 with resulting LE pain. Pt s/p Intramedullary fixation of right tibia.  Clinical Impression  Pt admitted with above diagnosis.  Pt reports to PT that she does not need SNF, she does not want SNF. She is a retired [employer] fundraiser and states she and her husband are familiar with lateral scooting as well as sliding board transfers. If husband confirms he is able to assist, recommend HHPT. Pt states she does not need any DME. Will follow   Pt currently with functional limitations due to the deficits listed below (see PT Problem List). Pt will benefit from skilled PT to increase their independence and safety with mobility to allow discharge to the venue listed below.       Follow Up Recommendations Home health PT    Equipment Recommendations  None recommended by PT    Recommendations for Other Services       Precautions / Restrictions Precautions Precautions: Fall Restrictions RLE Weight Bearing: Non weight bearing      Mobility  Bed Mobility               General bed mobility comments: see transfers-- pt able to assist with scooting up in bed from supine position    Transfers Overall transfer level: Needs assistance Equipment used: None Transfers: Lateral/Scoot Transfers   Stand pivot transfers: Mod assist;+2 physical assistance;+2 safety/equipment       General transfer comment: performed lateral scooting transfer from reclined position of recliner to flat bed to pt L side (d/t difficulty in maintaining NWB in am and incr pain with  dependent positioning LLE)  Ambulation/Gait                Stairs            Wheelchair Mobility    Modified Rankin (Stroke Patients Only)       Balance Overall balance assessment: History of Falls;Needs assistance                                           Pertinent Vitals/Pain Pain Assessment: Faces Faces Pain Scale: Hurts little more Pain Location: R LE Pain Descriptors / Indicators: Grimacing;Sore Pain Intervention(s): Limited activity within patient's tolerance;Monitored during session;Premedicated before session;Repositioned    Home Living Family/patient expects to be discharged to:: Private residence Living Arrangements: Spouse/significant other Available Help at Discharge: Family;Available 24 hours/day Type of Home: House Home Access: Ramped entrance     Home Layout: Two level;Bed/bath upstairs Home Equipment: Walker - 2 wheels;Hospital bed;Bedside commode;Tub bench;Other (comment);Electric scooter Additional Comments: has Wellsite geologist / Transfers Assistance Needed: nonambulatory. per pt, she was able to perform transfers various surfaces  to/from [facility] with Mod Ind           Hand Dominance        Extremity/Trunk Assessment   Upper Extremity Assessment Upper Extremity Assessment: Defer to OT evaluation    Lower Extremity Assessment Lower Extremity Assessment: RLE deficits/detail;LLE deficits/detail  RLE: Unable to fully assess due to pain LLE Deficits / Details: grossly WFL       Communication   Communication: Expressive difficulties (word finding difficulty at times (hx CVA))  Cognition Arousal/Alertness: Awake/alert Behavior During Therapy: WFL for tasks assessed/performed Overall Cognitive Status: No family/caregiver present to determine baseline cognitive functioning                                        General Comments      Exercises     Assessment/Plan     PT Assessment Patient needs continued PT services  PT Problem List Decreased strength;Decreased range of motion;Decreased activity tolerance;Decreased mobility;Pain       PT Treatment Interventions Therapeutic activities;Gait training;Functional mobility training;Patient/family education;DME instruction;Therapeutic exercise    PT Goals (Current goals can be found in the Care Plan section)  Acute Rehab PT Goals Patient Stated Goal: "my husband and I can do this at home" PT Goal Formulation: With patient Time For Goal Achievement: 06/11/20 Potential to Achieve Goals: Good    Frequency Min 3X/week   Barriers to discharge        Co-evaluation               AM-PAC PT "6 Clicks" Mobility  Outcome Measure Help needed turning from your back to your side while in a flat bed without using bedrails?: A Lot Help needed moving from lying on your back to sitting on the side of a flat bed without using bedrails?: A Lot Help needed moving to and from a bed to a chair (including a wheelchair)?: A Lot Help needed standing up from a chair using your arms (e.g., wheelchair or bedside chair)?: Total Help needed to walk in hospital room?: Total Help needed climbing 3-5 steps with a railing? : Total 6 Click Score: 9    End of Session   Activity Tolerance: Patient tolerated treatment well Patient left: with call bell/phone within reach;in bed;with bed alarm set   PT Visit Diagnosis: Other abnormalities of gait and mobility (R26.89);Pain Pain - Right/Left: Right Pain - part of body: Leg    Time: 1330-1403 PT Time Calculation (min) (ACUTE ONLY): 33 min   Charges:   PT Evaluation $PT Eval Low Complexity: 1 Low PT Treatments $Therapeutic Activity: 8-22 mins        Delice Bison, PT  Acute Rehab Dept ([facility]) [phone] Pager [phone]  06/04/2020   [facility] 06/04/2020, 3:55 PM

## 2020-06-04 NOTE — Progress Notes (Addendum)
PROGRESS NOTE    Heidi Green  ULA:[id number] DOB: 05-Jul-1955 DOA: 06/02/2020 PCP: Chiquita Loth, PA    Chief Complaint  Patient presents with  . Leg Injury    Brief Narrative:  Patient 65 year old female history of intracerebral hemorrhage 2018, hypertension, hyperlipidemia, chronic lower extremity wounds, OSA on CPAP, chronic pain syndrome, obesity, wheelchair-bound presenting to the ED from PCPs office with reported right tib-fib fracture. Plain films done of right tib-fib consistent with minimally displaced fracture of the proximal right tibial metadiaphysis junction.  Commuted proximal fibular head fracture with nondisplaced distal fibular diaphyseal fracture. Orthopedics consulted. Patient underwent repair of right tib-fib fracture 06/03/2020   Assessment & Plan:   Principal Problem:   Tibia/fibula fracture Active Problems:   HTN (hypertension)   Dyslipidemia   History of CVA with residual deficit   OSA (obstructive sleep apnea)  1 right tib-fib fracture X-ray of right tib-fib showing commuted minimally displaced fracture of the proximal right tibia metadiaphyseal junction.  Commuted proximal fibular head fracture with nondisplaced distal fibular diaphyseal fracture.  Widening of ankle mortise laterally may reflect ligamentous injury.  Diffuse soft tissue edema. -Orthopedics consulted and additional imaging of right ankle and right knee obtained. -Patient subsequently underwent intramedullary fixation of the right tibia/live external rotation stress testing of the right ankle under fluoroscopy per Dr. Linna Caprice 06/03/2020. -PT/OT. -Postop DVT prophylaxis per orthopedics.  2.  Hypertension Blood pressure borderline.  Hold lisinopril.  Follow.  3.  Hyperlipidemia Continue Zetia.  4.  OSA CPAP nightly.  5.  GERD PPI.  6.  Hypokalemia Repleted.  7.  Leukocytosis Likely reactive leukocytosis.  Patient afebrile.  No respiratory symptoms.  No urinary symptoms.   Chest x-ray done negative for any acute infiltrate.  Check a UA with cultures and sensitivities.  Pulmonary toilet.  8.  Postop acute blood loss anemia Patient with no overt bleeding.  Check an anemia panel.  Transfusion threshold hemoglobin , 7.  Follow H&H   DVT prophylaxis: Postop DVT prophylaxis per orthopedics Code Status: Full Family Communication: Updated patient.  No family at bedside. Disposition:   Status is: Inpatient    Dispo: The patient is from: Home              Anticipated d/c is to: Home with home health versus SNF              Anticipated d/c date is: 1-2 days and when cleared by orthopedics.              Patient currently postop, awaiting PT evaluation.  Not stable for discharge.     Difficult to place patient no       Consultants:   Orthopedics: Dr. Linna Caprice 06/03/2020  Procedures:   Plain films right tib-fib 06/03/2020, 06/02/2020  Plain films right ankle 06/02/2020  Plain films right knee 06/02/2020  Antimicrobials:   None   Subjective: Sitting up in chair.  Complaining of pain in the right lower extremity.  Tolerating current diet.  No chest pain.  No shortness of breath.  Asking whether she is going to be able to go home tomorrow.  Objective: Vitals:   06/03/20 2139 06/04/20 0153 06/04/20 0610 06/04/20 1007  BP: 93/60 (!) 101/45 (!) 107/51 125/70  Pulse: 84 84 70 77  Resp: 18 16 16 18   Temp: 97.9 F (36.6 C) 98 F (36.7 C) 97.8 F (36.6 C) 98.1 F (36.7 C)  TempSrc: Oral  Oral   SpO2: 95% 94% 95% 94%  Weight:  Height:        Intake/Output Summary (Last 24 hours) at 06/04/2020 1115 Last data filed at 06/04/2020 0941 Gross per 24 hour  Intake 1160 ml  Output 1075 ml  Net 85 ml   Filed Weights   06/02/20 1918  Weight: 117.9 kg    Examination:  General exam: : NAD Respiratory system: CTA B anterior lung fields.  No wheezes, no rhonchi.  Speaking in full sentences.  Normal respiratory effort. Cardiovascular system: Regular  rate and rhythm no murmurs rubs or gallops.  No JVD.  No lower extremity edema.  Gastrointestinal system: Abdomen soft, nontender, nondistended, positive bowel sounds.  No rebound.  No guarding. Central nervous system: Alert and oriented. No focal neurological deficits. Extremities: Right lower extremity in postop bandage. Skin: No rashes, lesions or ulcers Psychiatry: Judgement and insight appear normal. Mood & affect appropriate.   Data Reviewed: I have personally reviewed following labs and imaging studies  CBC: Recent Labs  Lab 06/02/20 2140 06/03/20 0352 06/04/20 0329  WBC 12.5* 10.9* 13.7*  NEUTROABS  --   --  11.0*  HGB 12.3 11.6* 10.4*  HCT 38.3 37.0 33.0*  MCV 91.6 92.5 92.2  PLT 290 279 287    Basic Metabolic Panel: Recent Labs  Lab 06/02/20 2140 06/03/20 0352 06/04/20 0329  NA 140 143 139  K 3.1* 3.1* 3.9  CL 105 108 107  CO2 27 24 24   GLUCOSE 104* 95 139*  BUN 14 16 14   CREATININE 0.64 0.59 0.64  CALCIUM 8.6* 8.6* 8.4*  MG  --  2.2  --     GFR: Estimated Creatinine Clearance: 96.2 mL/min (by C-G formula based on SCr of 0.64 mg/dL).  Liver Function Tests: No results for input(s): AST, ALT, ALKPHOS, BILITOT, PROT, ALBUMIN in the last 168 hours.  CBG: No results for input(s): GLUCAP in the last 168 hours.   Recent Results (from the past 240 hour(s))  Resp Panel by RT-PCR (Flu A&B, Covid) Nasopharyngeal Swab     Status: None   Collection Time: 06/02/20  8:51 PM   Specimen: Nasopharyngeal Swab; Nasopharyngeal(NP) swabs in vial transport medium  Result Value Ref Range Status   SARS Coronavirus 2 by RT PCR NEGATIVE NEGATIVE Final    Comment: (NOTE) SARS-CoV-2 target nucleic acids are NOT DETECTED.  The SARS-CoV-2 RNA is generally detectable in upper respiratory specimens during the acute phase of infection. The lowest concentration of SARS-CoV-2 viral copies this assay can detect is 138 copies/mL. A negative result does not preclude  SARS-Cov-2 infection and should not be used as the sole basis for treatment or other patient management decisions. A negative result may occur with  improper specimen collection/handling, submission of specimen other than nasopharyngeal swab, presence of viral mutation(s) within the areas targeted by this assay, and inadequate number of viral copies(<138 copies/mL). A negative result must be combined with clinical observations, patient history, and epidemiological information. The expected result is Negative.  Fact Sheet for Patients:   Fact Sheet for Healthcare Providers:  06/04/20  This test is no t yet approved or cleared by the [employer] FDA and  has been authorized for detection and/or diagnosis of SARS-CoV-2 by FDA under an Emergency Use Authorization (EUA). This EUA will remain  in effect (meaning this test can be used) for the duration of the COVID-19 declaration under Section 564(b)(1) of the Act, 21 U.S.C.section 360bbb-3(b)(1), unless the authorization is terminated  or revoked sooner.       Influenza A  by PCR NEGATIVE NEGATIVE Final   Influenza B by PCR NEGATIVE NEGATIVE Final    Comment: (NOTE) The Xpert Xpress SARS-CoV-2/FLU/RSV plus assay is intended as an aid in the diagnosis of influenza from Nasopharyngeal swab specimens and should not be used as a sole basis for treatment. Nasal washings and aspirates are unacceptable for Xpert Xpress SARS-CoV-2/FLU/RSV testing.  Fact Sheet for Patients: BloggerCourse.comhttps://www.fda.gov/media/152166/download  Fact Sheet for Healthcare Providers: SeriousBroker.ithttps://www.fda.gov/media/152162/download  This test is not yet approved or cleared by the [place] FDA and has been authorized for detection and/or diagnosis of SARS-CoV-2 by FDA under an Emergency Use Authorization (EUA). This EUA will remain in effect (meaning this test can be used) for the duration of  the COVID-19 declaration under Section 564(b)(1) of the Act, 21 U.S.C. section 360bbb-3(b)(1), unless the authorization is terminated or revoked.  Performed at [facility], [address] [place], [place] [address]   MRSA PCR Screening     Status: None   Collection Time: 06/03/20  1:15 AM   Specimen: Nasopharyngeal  Result Value Ref Range Status   MRSA by PCR NEGATIVE NEGATIVE Final    Comment:        The GeneXpert MRSA Assay (FDA approved for NASAL specimens only), is one component of a comprehensive MRSA colonization surveillance program. It is not intended to diagnose MRSA infection nor to guide or monitor treatment for MRSA infections. Performed at [facility], [address] [place], [place] [address]          Radiology Studies: DG Tibia/Fibula Right  Result Date: 06/03/2020 CLINICAL DATA:  65 year old female with history of right tibial and fibular fractures. EXAM: RIGHT TIBIA AND FIBULA - 2 VIEW COMPARISON:  06/03/2020 and 06/02/2020 FINDINGS: Total of 7 spot images from intraoperative tibial nail placement demonstrate postsurgical changes after tibial nail placement with 3 proximal and 2 distal lag screws. No complicating features. Similar appearing comminuted proximal tibial fracture with improved alignment. Similar appearing proximal and distal fibular fractures without significant displacement. IMPRESSION: Status post right tibial nail fixation without complicating features. Electronically Signed   By: Marliss Cootsylan  Suttle MD   On: 06/03/2020 11:10   DG Tibia/Fibula Right  Result Date: 06/02/2020 CLINICAL DATA:  Twisting injury 5 days ago, pain and swelling, bruising EXAM: RIGHT TIBIA AND FIBULA - 2 VIEW COMPARISON:  12/19/2019 FINDINGS: Frontal and lateral views of the right tibia and fibula are obtained. There is a comminuted displaced proximal tibial metadiaphyseal fracture with mild distraction of the fracture fragments. The  fracture line does not extend to the joint space of the right knee. There is a comminuted displaced fracture of the proximal fibular head, moderately displaced. A nondisplaced oblique fracture of the distal fibular diaphysis is also noted. There is diffuse soft tissue edema of the right lower extremity. Right knee is well aligned. There is slight widening of the ankle mortise laterally which may reflect ligamentous injury. IMPRESSION: 1. Comminuted minimally displaced fracture of the proximal right tibial metadiaphyseal junction. The fracture does not extend to the articular surface of the right knee. 2. Comminuted proximal fibular head fracture, with nondisplaced distal fibular diaphyseal fracture. 3. Widening of the ankle mortise laterally, which may reflect ligamentous injury. 4. Diffuse soft tissue edema. Electronically Signed   By: Sharlet SalinaMichael  Brown M.D.   On: 06/02/2020 20:16   DG Ankle Complete Right  Result Date: 06/02/2020 CLINICAL DATA:  Fall EXAM: RIGHT ANKLE - COMPLETE 3+ VIEW COMPARISON:  None. FINDINGS: There is an oblique  fracture of the distal right fibula. No ankle dislocation or joint effusion. IMPRESSION: Oblique fracture of the distal right fibula. Electronically Signed   By: Deatra Robinson M.D.   On: 06/02/2020 21:29   DG CHEST PORT 1 VIEW  Result Date: 06/04/2020 CLINICAL DATA:  Leukocytosis, history hypertension EXAM: PORTABLE CHEST 1 VIEW COMPARISON:  Portable exam 0846 hours compared to 02/11/2020 FINDINGS: Normal heart size, mediastinal contours, and pulmonary vascularity. Atherosclerotic calcification aorta. Subsegmental atelectasis at RIGHT lung base. Remaining lungs clear. No pleural effusion or pneumothorax. IMPRESSION: Subsegmental atelectasis at RIGHT lung base. Electronically Signed   By: Ulyses Southward M.D.   On: 06/04/2020 10:02   DG Knee Complete 4 Views Right  Result Date: 06/02/2020 CLINICAL DATA:  Fall EXAM: RIGHT KNEE - COMPLETE 4+ VIEW COMPARISON:  None. FINDINGS: There  is a comminuted fracture of proximal right tibia with lateral displacement. There is no intra-articular extension. The knee remains located. There is a comminuted fracture of the proximal fibula, also with mild lateral displacement. There is a distal fibular fracture which is demonstrated concomitant tibia/fibular radiographs. No distal femoral fracture. No knee effusion. IMPRESSION: 1. Comminuted fracture of the proximal right tibia with lateral displacement. 2. Comminuted fracture of the proximal fibula with mild lateral displacement. Electronically Signed   By: Deatra Robinson M.D.   On: 06/02/2020 21:28   DG Tibia/Fibula Right Port  Result Date: 06/03/2020 CLINICAL DATA:  65 year old female with tibial fracture EXAM: PORTABLE RIGHT TIBIA AND FIBULA - 2 VIEW COMPARISON:  06/02/2020 FINDINGS: Interval surgical changes of tibial intramedullary rod with proximal and distal interlocking screws for treatment of tibial fracture. Alignment is relatively anatomic. Segmental fracture of the fibula, involving both the fibular head and distal fibula again noted. Soft tissue swelling throughout. Surgical staples project in the region of the knee. Expected gas within the surgical site. IMPRESSION: Early/interval surgical changes of right tibial fracture ORIF, with intramedullary rod and interlocking screws as above. Segmental fibular fracture again noted. Electronically Signed   By: Gilmer Mor D.O.   On: 06/03/2020 11:25   DG C-Arm 1-60 Min-No Report  Result Date: 06/03/2020 Fluoroscopy was utilized by the requesting physician.  No radiographic interpretation.        Scheduled Meds: . docusate sodium  100 mg Oral BID  . enoxaparin (LOVENOX) injection  40 mg Subcutaneous Q24H  . ezetimibe  10 mg Oral Q supper  . ferrous sulfate  325 mg Oral BID WC  . oxybutynin  5 mg Oral Daily  . pantoprazole  40 mg Oral Daily  . pregabalin  150 mg Oral BID  . rOPINIRole  2 mg Oral BID  . zolpidem  5 mg Oral QHS    Continuous Infusions: . methocarbamol (ROBAXIN) IV       LOS: 2 days    Time spent: 35 minutes    Ramiro Harvest, MD [facility]   To contact the attending provider between 7A-7P or the covering provider during after hours 7P-7A, please log into the web site [email or web address] and access using universal [place] password for that web site. If you do not have the password, please call the hospital operator.  06/04/2020, 11:15 AM

## 2020-06-04 NOTE — Progress Notes (Signed)
Pt does not want to wear nocturnal cpap during this hospital stay.  Pt was encouraged to call should she change her mind.

## 2020-06-04 NOTE — Progress Notes (Signed)
Dr. Janee Morn notified of pt not urinating after foley d/c'ed at 1300. Md advised nursing staff to continue to monitor.

## 2020-06-05 DIAGNOSIS — I6930 Unspecified sequelae of cerebral infarction: Secondary | ICD-10-CM

## 2020-06-05 DIAGNOSIS — E785 Hyperlipidemia, unspecified: Secondary | ICD-10-CM | POA: Diagnosis not present

## 2020-06-05 DIAGNOSIS — K219 Gastro-esophageal reflux disease without esophagitis: Secondary | ICD-10-CM

## 2020-06-05 DIAGNOSIS — D72829 Elevated white blood cell count, unspecified: Secondary | ICD-10-CM

## 2020-06-05 DIAGNOSIS — E538 Deficiency of other specified B group vitamins: Secondary | ICD-10-CM | POA: Diagnosis not present

## 2020-06-05 DIAGNOSIS — E876 Hypokalemia: Secondary | ICD-10-CM

## 2020-06-05 DIAGNOSIS — S82201P Unspecified fracture of shaft of right tibia, subsequent encounter for closed fracture with malunion: Secondary | ICD-10-CM | POA: Diagnosis not present

## 2020-06-05 LAB — CBC WITH DIFFERENTIAL/PLATELET
Abs Immature Granulocytes: 0.15 10*3/uL — ABNORMAL HIGH (ref 0.00–0.07)
Basophils Absolute: 0.1 10*3/uL (ref 0.0–0.1)
Basophils Relative: 1 %
Eosinophils Absolute: 0.3 10*3/uL (ref 0.0–0.5)
Eosinophils Relative: 2 %
HCT: 32.4 % — ABNORMAL LOW (ref 36.0–46.0)
Hemoglobin: 10.1 g/dL — ABNORMAL LOW (ref 12.0–15.0)
Immature Granulocytes: 1 %
Lymphocytes Relative: 36 %
Lymphs Abs: 5.4 10*3/uL — ABNORMAL HIGH (ref 0.7–4.0)
MCH: 29 pg (ref 26.0–34.0)
MCHC: 31.2 g/dL (ref 30.0–36.0)
MCV: 93.1 fL (ref 80.0–100.0)
Monocytes Absolute: 0.8 10*3/uL (ref 0.1–1.0)
Monocytes Relative: 5 %
Neutro Abs: 8.3 10*3/uL — ABNORMAL HIGH (ref 1.7–7.7)
Neutrophils Relative %: 55 %
Platelets: 272 10*3/uL (ref 150–400)
RBC: 3.48 MIL/uL — ABNORMAL LOW (ref 3.87–5.11)
RDW: 15.5 % (ref 11.5–15.5)
WBC: 15.1 10*3/uL — ABNORMAL HIGH (ref 4.0–10.5)
nRBC: 0 % (ref 0.0–0.2)

## 2020-06-05 LAB — IRON AND TIBC
Iron: 42 ug/dL (ref 28–170)
Saturation Ratios: 14 % (ref 10.4–31.8)
TIBC: 297 ug/dL (ref 250–450)
UIBC: 255 ug/dL

## 2020-06-05 LAB — FOLATE: Folate: 4.1 ng/mL — ABNORMAL LOW (ref >5.9)

## 2020-06-05 LAB — BASIC METABOLIC PANEL
Anion gap: 8 (ref 5–15)
BUN: 16 mg/dL (ref 8–23)
CO2: 25 mmol/L (ref 22–32)
Calcium: 8.2 mg/dL — ABNORMAL LOW (ref 8.9–10.3)
Chloride: 106 mmol/L (ref 98–111)
Creatinine, Ser: 0.67 mg/dL (ref 0.44–1.00)
GFR, Estimated: >60 mL/min (ref >60)
Glucose, Bld: 104 mg/dL — ABNORMAL HIGH (ref 70–99)
Potassium: 3.6 mmol/L (ref 3.5–5.1)
Sodium: 139 mmol/L (ref 135–145)

## 2020-06-05 LAB — FERRITIN: Ferritin: 37 ng/mL (ref 11–307)

## 2020-06-05 LAB — VITAMIN B12: Vitamin B-12: 163 pg/mL — ABNORMAL LOW (ref 180–914)

## 2020-06-05 MED ORDER — FOLIC ACID 1 MG PO TABS
1.0000 mg | ORAL_TABLET | Freq: Every day | ORAL | Status: DC
  Administered 2020-06-05: 1 mg via ORAL
  Filled 2020-06-05: qty 1

## 2020-06-05 MED ORDER — SENNOSIDES-DOCUSATE SODIUM 8.6-50 MG PO TABS
1.0000 | ORAL_TABLET | Freq: Two times a day (BID) | ORAL | Status: DC
  Administered 2020-06-05 (×2): 1 via ORAL
  Filled 2020-06-05 (×2): qty 1

## 2020-06-05 MED ORDER — VITAMIN B-12 1000 MCG PO TABS: 1000.0000 ug | ORAL_TABLET | Freq: Every day | ORAL | Status: AC

## 2020-06-05 MED ORDER — HYDROCODONE-ACETAMINOPHEN 10-325 MG PO TABS
1.0000 | ORAL_TABLET | ORAL | 0 refills | Status: AC | PRN
Start: 2020-06-05 — End: ?

## 2020-06-05 MED ORDER — SENNOSIDES-DOCUSATE SODIUM 8.6-50 MG PO TABS: 1.0000 | ORAL_TABLET | Freq: Two times a day (BID) | ORAL | Status: AC

## 2020-06-05 MED ORDER — POLYETHYLENE GLYCOL 3350 17 G PO PACK: 17.0000 g | PACK | Freq: Every day | ORAL | Status: DC | PRN

## 2020-06-05 MED ORDER — LISINOPRIL 30 MG PO TABS: 30.0000 mg | ORAL_TABLET | Freq: Every day | ORAL | Status: AC

## 2020-06-05 MED ORDER — ASPIRIN 81 MG PO CHEW: 81.0000 mg | CHEWABLE_TABLET | Freq: Two times a day (BID) | ORAL | 0 refills | Status: AC

## 2020-06-05 MED ORDER — FOLIC ACID 1 MG PO TABS
1.0000 mg | ORAL_TABLET | Freq: Every day | ORAL | Status: AC
Start: 2020-06-06 — End: ?

## 2020-06-05 MED ORDER — CYANOCOBALAMIN 1000 MCG/ML IJ SOLN
1000.0000 ug | Freq: Every day | INTRAMUSCULAR | Status: DC
  Administered 2020-06-05: 1000 ug via SUBCUTANEOUS
  Filled 2020-06-05: qty 1

## 2020-06-05 NOTE — Progress Notes (Signed)
[facility] transport arrives on floor at 21:45. Patient safety transferred with 2 floor staff and 2 [facility] staff.  Pt VSS, Phone given, no other belongings. Husband called and updated concerning patients departure.

## 2020-06-05 NOTE — Progress Notes (Signed)
    Subjective:  Patient reports pain as mild.  Denies N/V/CP/SOB. States she wants to go home. Has been refusing lovenox   Objective:   VITALS:   Vitals:   06/04/20 2116 06/05/20 0140 06/05/20 0620 06/05/20 0953  BP: 140/69 (!) 143/76 132/72 131/72  Pulse: 75 73 65 67  Resp: 18 16 18 19   Temp: (!) 97.5 F (36.4 C) 98.1 F (36.7 C) 98 F (36.7 C) 97.9 F (36.6 C)  TempSrc:    Oral  SpO2: 98% 96% 97% 97%  Weight:      Height:        NAD ABD soft Compartment soft Weak PF, DF, EHL 2/2 stroke Foot perfused abnl sensation baseline  Lab Results  Component Value Date   WBC 15.1 (H) 06/05/2020   HGB 10.1 (L) 06/05/2020   HCT 32.4 (L) 06/05/2020   MCV 93.1 06/05/2020   PLT 272 06/05/2020   BMET    Component Value Date/Time   NA 139 06/05/2020 0617   NA 140 08/26/2016 0000   K 3.6 06/05/2020 0617   CL 106 06/05/2020 0617   CO2 25 06/05/2020 0617   GLUCOSE 104 (H) 06/05/2020 0617   BUN 16 06/05/2020 0617   BUN 16 08/26/2016 0000   CREATININE 0.67 06/05/2020 0617   CALCIUM 8.2 (L) 06/05/2020 0617   GFRNONAA >60 06/05/2020 0617   GFRAA >60 12/22/2019 0522     Assessment/Plan: 2 Days Post-Op   Principal Problem:   Tibia/fibula fracture Active Problems:   HTN (hypertension)   Dyslipidemia   History of CVA with residual deficit   OSA (obstructive sleep apnea)   Folate deficiency   B12 deficiency   NWB RLE, 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

## 2020-06-05 NOTE — Progress Notes (Signed)
Spoke with Pt regarding CPAP qhs.  Pt states she is being discharged and is waiting for her transportation.

## 2020-06-05 NOTE — Progress Notes (Signed)
During med pass, pt refused her Lovenox. I also noted on her MAR she refused 2/20 as well. I asked her why she didn't want it and she stated "My blood is already thin enough, look at all my bruises. If I Take Lovenox and go home and cut myself I'll never stop bleeding." I explained the potentially fatal risks and consequences of not taking Lovenox after orthopedic surgery and she continued to refuse. I messaged the hospitalist Dr Janee Morn, and the orthopedic surgeon Dr Linna Caprice,, to make them aware of her refusal two days in a row.

## 2020-06-05 NOTE — TOC Transition Note (Signed)
Transition of Care [facility]) - CM/SW Discharge Note   Patient Details  Name: Heidi Green MRN: [id number] Date of Birth: 10-03-1955  [facility]) CM/SW Contact:  Amada Jupiter, LCSW Phone Number: 06/05/2020, 3:15 PM   Clinical Narrative:    Pt medically cleared for dc today.  Pt and spouse request [facility] transport due to concerns with home entry - [facility] has been called.  Pt requests [facility] for PT/OT - referral sent.  Pt has all needed DME at home.  No further TOC needs.   Final next level of care: Home w Home Health Services Barriers to Discharge: Barriers Resolved   Patient Goals and CMS Choice Patient states their goals for this hospitalization and ongoing recovery are:: go home today      Discharge Placement                Patient to be transferred to facility by: [facility] to transport home Name of family member notified: spouse in room Patient and family notified of of transfer: 06/05/20  Discharge Plan and Services                DME Arranged: N/A DME Agency: NA       HH Arranged: PT,OT HH Agency: [facility] Date HH Agency Contacted: 06/05/20   Representative spoke with at [facility] Agency: [place]  Social Determinants of Health (SDOH) Interventions     Readmission Risk Interventions Readmission Risk Prevention Plan 06/05/2020  Transportation Screening Complete  PCP or Specialist Appt within 3-5 Days Complete  Some recent data might be hidden

## 2020-06-05 NOTE — Discharge Summary (Signed)
Physician Discharge Summary  Heidi Green:[id number] DOB: Feb 01, 1956 DOA: 06/02/2020  PCP: Chiquita Loth, PA  Admit date: 06/02/2020 Discharge date: 06/05/2020  Time spent: 55 minutes  Recommendations for Outpatient Follow-up:  1. Follow-up with Dr. Linna Caprice, orthopedics in 2 weeks. 2. Follow-up with Chiquita Loth, PA in 2 to 3 weeks.  On follow-up patient will need a basic metabolic profile done to follow-up on electrolytes and renal function.  Patient need a CBC done to follow-up on H&H.  Patient's folate and vitamin B12 deficiency will need to be followed up upon.   Discharge Diagnoses:  Principal Problem:   Tibia/fibula fracture Active Problems:   HTN (hypertension)   Dyslipidemia   History of CVA with residual deficit   OSA (obstructive sleep apnea)   Folate deficiency   B12 deficiency   Discharge Condition: Stable and improved  Diet recommendation: Heart healthy  Filed Weights   06/02/20 1918  Weight: 117.9 kg    History of present illness:  HPI per Dr. Titus Mould Heidi Green is a 66 y.o. female with medical history significant of intracerebral hemorrhage in 2018, hypertension, hyperlipidemia, chronic lower extremity wounds, OSA on CPAP, chronic pain syndrome, obesity (BMI 38.40) presenting to the ED from her PCPs office with a reported right tib-fib fracture.  History provided by patient and her husband.  She is wheelchair-bound at baseline.  Stated on Sunday while trying to get up from her bedside commode, she lost her balance and fell and since then experiencing pain in her right leg just below the knee.  She is not on any blood thinners.  She was fully vaccinated against COVID including booster shot.  Denied any headaches, neck pain, fevers, chills, cough, shortness of breath, chest pain, nausea, or vomiting.  ED Course: Vital signs stable.  CBC, BMP, and screening Covid test pending.    X-ray of right tibia/fibula showing comminuted minimally displaced  fracture of the proximal right tibial metadiaphyseal junction.  Comminuted proximal fibular head fracture with nondisplaced distal fibular diaphyseal fracture.  Widening of the ankle mortise laterally, may reflect ligamentous injury.  Diffuse soft tissue edema.  Orthopedics consulted and recommended additional imaging of the right ankle and knee (x-rays pending).  Recommended long-leg splint which was applied in the ED.  Planning for surgery in the morning.  Patient was given fentanyl for pain.  Hospital Course:  1 right tib-fib fracture X-ray of right tib-fib showed commuted minimally displaced fracture of the proximal right tibia metadiaphyseal junction.  Commuted proximal fibular head fracture with nondisplaced distal fibular diaphyseal fracture.  Widening of ankle mortise laterally may reflect ligamentous injury.  Diffuse soft tissue edema. -Orthopedics consulted and additional imaging of right ankle and right knee obtained. -Patient subsequently underwent intramedullary fixation of the right tibia/live external rotation stress testing of the right ankle under fluoroscopy per Dr. Linna Caprice 06/03/2020.  -Patient assessed by therapy.  PT/OT. -Patient insistent on being discharged and was cleared by orthopedics. -Patient noted to have been refusing Lovenox during the hospitalization and patient subsequently placed on aspirin for DVT prophylaxis per orthopedics. -Patient be discharged home with home health therapies. -Outpatient follow-up with orthopedics.  2.  Hypertension Blood pressure borderline early on during the hospitalization.  Patient's ACE inhibitor was held and will be resumed 2 to 3 days post discharge.  Outpatient follow-up with PCP.  3.  Hyperlipidemia Patient maintained on his home regimen Zetia.  Outpatient follow-up with PCP.  4.  OSA CPAP nightly.  5.  GERD Patient maintained on  PPI.  6.  Hypokalemia Repleted.  7.  Leukocytosis Likely reactive leukocytosis.   Patient afebrile.  No respiratory symptoms.  No urinary symptoms.  Chest x-ray done negative for any acute infiltrate.    Urinalysis was unremarkable.  Outpatient follow-up with PCP.   8.  Postop acute blood loss anemia/folate deficiency/vitamin B12 deficiency Patient with no overt bleeding.   Anemia panel obtained with a folate and vitamin B12 deficiency.  Patient started on folic acid daily as well as vitamin B12 supplementation.  Hemoglobin remained stable at 10.1 by day of discharge.  Outpatient follow-up with PCP.   Procedures:  Plain films right tib-fib 06/03/2020, 06/02/2020  Plain films right ankle 06/02/2020  Plain films right knee 06/02/2020  IM fixation of right tibia/left external rotation stress testing of right ankle under fluoroscopy/interpretation of fluoroscopic images per Dr. Linna Caprice 06/03/2020  Consultations:  Orthopedics: Dr. Linna Caprice 06/03/2020   Discharge Exam: Vitals:   06/05/20 0953 06/05/20 1335  BP: 131/72 114/67  Pulse: 67 65  Resp: 19 16  Temp: 97.9 F (36.6 C) 98.2 F (36.8 C)  SpO2: 97% 93%    General: NAD Cardiovascular: RRR Respiratory: CTAB anterior lung fields.  Discharge Instructions   Discharge Instructions    Diet - low sodium heart healthy   Complete by: As directed    Discharge wound care:   Complete by: As directed    As above   Increase activity slowly   Complete by: As directed      Allergies as of 06/05/2020      Reactions   Buprenorphine Itching   Garamycin [gentamicin Sulfate] Hives, Shortness Of Breath   Other Anaphylaxis   Reaction to HORSE SERUM   Penicillins Anaphylaxis, Itching, Swelling   Swelling and shortness of breath after taking penicillin for a tooth infection when patient was 24. Per her account, she believes that she has taken amoxicillin and cephalosporins since that time and had no issues.    Atorvastatin Nausea And Vomiting   Norvasc [amlodipine Besylate] Nausea And Vomiting   Rosuvastatin Diarrhea,  Other (See Comments)   Spasms in hand      Medication List    STOP taking these medications   predniSONE 10 MG (21) Tbpk tablet Commonly known as: STERAPRED UNI-PAK 21 TAB     TAKE these medications   albuterol 108 (90 Base) MCG/ACT inhaler Commonly known as: VENTOLIN HFA Inhale 2 puffs into the lungs every 6 (six) hours as needed for wheezing or shortness of breath.   aspirin 81 MG chewable tablet Commonly known as: Aspirin Childrens Chew 1 tablet (81 mg total) by mouth 2 (two) times daily with a meal.   diclofenac Sodium 1 % Gel Commonly known as: VOLTAREN Apply 2 g topically 4 (four) times daily.   esomeprazole 40 MG capsule Commonly known as: NEXIUM Take 40 mg by mouth daily with supper.   ezetimibe 10 MG tablet Commonly known as: ZETIA Take 10 mg by mouth daily with supper.   FeroSul 325 (65 FE) MG tablet Generic drug: ferrous sulfate Take 325 mg by mouth 2 (two) times daily with a meal.   folic acid 1 MG tablet Commonly known as: FOLVITE Take 1 tablet (1 mg total) by mouth daily. Start taking on: June 06, 2020   HYDROcodone-acetaminophen 10-325 MG tablet Commonly known as: NORCO Take 1 tablet by mouth every 4 (four) hours as needed for moderate pain or severe pain. What changed: when to take this   lisinopril 30 MG tablet Commonly  known as: ZESTRIL Take 1 tablet (30 mg total) by mouth daily. Start taking on: June 08, 2020 What changed: These instructions start on June 08, 2020. If you are unsure what to do until then, ask your doctor or other care provider.   oxybutynin 5 MG 24 hr tablet Commonly known as: DITROPAN-XL Take 5 mg by mouth daily.   pregabalin 150 MG capsule Commonly known as: LYRICA Take 150 mg by mouth 2 (two) times daily.   rOPINIRole 2 MG tablet Commonly known as: REQUIP Take 2 mg by mouth 2 (two) times daily.   senna-docusate 8.6-50 MG tablet Commonly known as: Senokot-S Take 1 tablet by mouth 2 (two) times daily.    vitamin B-12 1000 MCG tablet Commonly known as: CYANOCOBALAMIN Take 1 tablet (1,000 mcg total) by mouth daily.   Vitamin D (Ergocalciferol) 1.25 MG (50000 UNIT) Caps capsule Commonly known as: DRISDOL Take 50,000 Units by mouth every Sunday.   zolpidem 10 MG tablet Commonly known as: AMBIEN Take 5 mg by mouth at bedtime.            Discharge Care Instructions  (From admission, onward)         Start     Ordered   06/05/20 0000  Discharge wound care:       Comments: As above   06/05/20 1500         Allergies  Allergen Reactions  . Buprenorphine Itching  . Garamycin [Gentamicin Sulfate] Hives and Shortness Of Breath  . Other Anaphylaxis    Reaction to HORSE SERUM  . Penicillins Anaphylaxis, Itching and Swelling    Swelling and shortness of breath after taking penicillin for a tooth infection when patient was 24. Per her account, she believes that she has taken amoxicillin and cephalosporins since that time and had no issues.   . Atorvastatin Nausea And Vomiting  . Norvasc [Amlodipine Besylate] Nausea And Vomiting  . Rosuvastatin Diarrhea and Other (See Comments)    Spasms in hand     Follow-up Information    Swinteck, Arlys John, MD. Schedule an appointment as soon as possible for a visit in 2 weeks.   Specialty: Orthopedic Surgery Why: For suture removal Contact information: [address] [place] [address] [phone]        Chiquita Loth, [place]. Schedule an appointment as soon as possible for a visit in 2 week(s).   Specialty: Physician Assistant Why: f/u in 2-3 weeks. Contact information: [address] [place] [address] [phone]                The results of significant diagnostics from this hospitalization (including imaging, microbiology, ancillary and laboratory) are listed below for reference.    Significant Diagnostic Studies: DG Tibia/Fibula Right  Result Date: 06/03/2020 CLINICAL DATA:  65 year old  female with history of right tibial and fibular fractures. EXAM: RIGHT TIBIA AND FIBULA - 2 VIEW COMPARISON:  06/03/2020 and 06/02/2020 FINDINGS: Total of 7 spot images from intraoperative tibial nail placement demonstrate postsurgical changes after tibial nail placement with 3 proximal and 2 distal lag screws. No complicating features. Similar appearing comminuted proximal tibial fracture with improved alignment. Similar appearing proximal and distal fibular fractures without significant displacement. IMPRESSION: Status post right tibial nail fixation without complicating features. Electronically Signed   By: Marliss Coots MD   On: 06/03/2020 11:10   DG Tibia/Fibula Right  Result Date: 06/02/2020 CLINICAL DATA:  Twisting injury 5 days ago, pain and swelling, bruising EXAM: RIGHT TIBIA  AND FIBULA - 2 VIEW COMPARISON:  12/19/2019 FINDINGS: Frontal and lateral views of the right tibia and fibula are obtained. There is a comminuted displaced proximal tibial metadiaphyseal fracture with mild distraction of the fracture fragments. The fracture line does not extend to the joint space of the right knee. There is a comminuted displaced fracture of the proximal fibular head, moderately displaced. A nondisplaced oblique fracture of the distal fibular diaphysis is also noted. There is diffuse soft tissue edema of the right lower extremity. Right knee is well aligned. There is slight widening of the ankle mortise laterally which may reflect ligamentous injury. IMPRESSION: 1. Comminuted minimally displaced fracture of the proximal right tibial metadiaphyseal junction. The fracture does not extend to the articular surface of the right knee. 2. Comminuted proximal fibular head fracture, with nondisplaced distal fibular diaphyseal fracture. 3. Widening of the ankle mortise laterally, which may reflect ligamentous injury. 4. Diffuse soft tissue edema. Electronically Signed   By: Sharlet Salina M.D.   On: 06/02/2020 20:16   DG  Ankle Complete Right  Result Date: 06/02/2020 CLINICAL DATA:  Fall EXAM: RIGHT ANKLE - COMPLETE 3+ VIEW COMPARISON:  None. FINDINGS: There is an oblique fracture of the distal right fibula. No ankle dislocation or joint effusion. IMPRESSION: Oblique fracture of the distal right fibula. Electronically Signed   By: Deatra Robinson M.D.   On: 06/02/2020 21:29   DG CHEST PORT 1 VIEW  Result Date: 06/04/2020 CLINICAL DATA:  Leukocytosis, history hypertension EXAM: PORTABLE CHEST 1 VIEW COMPARISON:  Portable exam 0846 hours compared to 02/11/2020 FINDINGS: Normal heart size, mediastinal contours, and pulmonary vascularity. Atherosclerotic calcification aorta. Subsegmental atelectasis at RIGHT lung base. Remaining lungs clear. No pleural effusion or pneumothorax. IMPRESSION: Subsegmental atelectasis at RIGHT lung base. Electronically Signed   By: Ulyses Southward M.D.   On: 06/04/2020 10:02   DG Knee Complete 4 Views Right  Result Date: 06/02/2020 CLINICAL DATA:  Fall EXAM: RIGHT KNEE - COMPLETE 4+ VIEW COMPARISON:  None. FINDINGS: There is a comminuted fracture of proximal right tibia with lateral displacement. There is no intra-articular extension. The knee remains located. There is a comminuted fracture of the proximal fibula, also with mild lateral displacement. There is a distal fibular fracture which is demonstrated concomitant tibia/fibular radiographs. No distal femoral fracture. No knee effusion. IMPRESSION: 1. Comminuted fracture of the proximal right tibia with lateral displacement. 2. Comminuted fracture of the proximal fibula with mild lateral displacement. Electronically Signed   By: Deatra Robinson M.D.   On: 06/02/2020 21:28   DG Knee Left Port  Result Date: 05/15/2020 CLINICAL DATA:  LEFT knee swelling, limited range of motion EXAM: PORTABLE LEFT KNEE - 1-2 VIEW COMPARISON:  12/19/2019 FINDINGS: Osseous demineralization. Tricompartmental osteoarthritic changes with joint space narrowing and spur  formation. No acute fracture, dislocation, or bone destruction. Moderate-sized joint effusion. Scattered vascular calcifications. IMPRESSION: Osseous demineralization with tricompartmental osteoarthritic changes and moderate joint effusion. No acute fracture or dislocation. Electronically Signed   By: Ulyses Southward M.D.   On: 05/15/2020 13:10   DG Tibia/Fibula Right Port  Result Date: 06/03/2020 CLINICAL DATA:  65 year old female with tibial fracture EXAM: PORTABLE RIGHT TIBIA AND FIBULA - 2 VIEW COMPARISON:  06/02/2020 FINDINGS: Interval surgical changes of tibial intramedullary rod with proximal and distal interlocking screws for treatment of tibial fracture. Alignment is relatively anatomic. Segmental fracture of the fibula, involving both the fibular head and distal fibula again noted. Soft tissue swelling throughout. Surgical staples project in the region  of the knee. Expected gas within the surgical site. IMPRESSION: Early/interval surgical changes of right tibial fracture ORIF, with intramedullary rod and interlocking screws as above. Segmental fibular fracture again noted. Electronically Signed   By: Gilmer MorJaime  Wagner D.O.   On: 06/03/2020 11:25   DG C-Arm 1-60 Min-No Report  Result Date: 06/03/2020 Fluoroscopy was utilized by the requesting physician.  No radiographic interpretation.    Microbiology: Recent Results (from the past 240 hour(s))  Resp Panel by RT-PCR (Flu A&B, Covid) Nasopharyngeal Swab     Status: None   Collection Time: 06/02/20  8:51 PM   Specimen: Nasopharyngeal Swab; Nasopharyngeal(NP) swabs in vial transport medium  Result Value Ref Range Status   SARS Coronavirus 2 by RT PCR NEGATIVE NEGATIVE Final    Comment: (NOTE) SARS-CoV-2 target nucleic acids are NOT DETECTED.  The SARS-CoV-2 RNA is generally detectable in upper respiratory specimens during the acute phase of infection. The lowest concentration of SARS-CoV-2 viral copies this assay can detect is 138 copies/mL. A  negative result does not preclude SARS-Cov-2 infection and should not be used as the sole basis for treatment or other patient management decisions. A negative result may occur with  improper specimen collection/handling, submission of specimen other than nasopharyngeal swab, presence of viral mutation(s) within the areas targeted by this assay, and inadequate number of viral copies(<138 copies/mL). A negative result must be combined with clinical observations, patient history, and epidemiological information. The expected result is Negative.  Fact Sheet for Patients:  BloggerCourse.comhttps://www.fda.gov/media/152166/download  Fact Sheet for Healthcare Providers:  SeriousBroker.ithttps://www.fda.gov/media/152162/download  This test is no t yet approved or cleared by the [place] FDA and  has been authorized for detection and/or diagnosis of SARS-CoV-2 by FDA under an Emergency Use Authorization (EUA). This EUA will remain  in effect (meaning this test can be used) for the duration of the COVID-19 declaration under Section 564(b)(1) of the Act, 21 U.S.C.section 360bbb-3(b)(1), unless the authorization is terminated  or revoked sooner.       Influenza A by PCR NEGATIVE NEGATIVE Final   Influenza B by PCR NEGATIVE NEGATIVE Final    Comment: (NOTE) The Xpert Xpress SARS-CoV-2/FLU/RSV plus assay is intended as an aid in the diagnosis of influenza from Nasopharyngeal swab specimens and should not be used as a sole basis for treatment. Nasal washings and aspirates are unacceptable for Xpert Xpress SARS-CoV-2/FLU/RSV testing.  Fact Sheet for Patients: BloggerCourse.comhttps://www.fda.gov/media/152166/download  Fact Sheet for Healthcare Providers: SeriousBroker.ithttps://www.fda.gov/media/152162/download  This test is not yet approved or cleared by the [place] FDA and has been authorized for detection and/or diagnosis of SARS-CoV-2 by FDA under an Emergency Use Authorization (EUA). This EUA will remain in effect (meaning this test can  be used) for the duration of the COVID-19 declaration under Section 564(b)(1) of the Act, 21 U.S.C. section 360bbb-3(b)(1), unless the authorization is terminated or revoked.  Performed at [facility], [address] [place], [place] [id number]   MRSA PCR Screening     Status: None   Collection Time: 06/03/20  1:15 AM   Specimen: Nasopharyngeal  Result Value Ref Range Status   MRSA by PCR NEGATIVE NEGATIVE Final    Comment:        The GeneXpert MRSA Assay (FDA approved for NASAL specimens only), is one component of a comprehensive MRSA colonization surveillance program. It is not intended to diagnose MRSA infection nor to guide or monitor treatment for MRSA infections. Performed at [facility], [address] [place],  [place] [address]   Culture, Urine     Status: Abnormal (Preliminary result)   Collection Time: 06/04/20 10:20 AM   Specimen: Urine, Catheterized  Result Value Ref Range Status   Specimen Description   Final    URINE, CATHETERIZED Performed at [facility], [address], [place], [place] [address]    Special Requests   Final    NONE Performed at [facility], [address] [place], [place] [address]    Culture (A)  Final    30,000 COLONIES/mL GRAM NEGATIVE RODS SUSCEPTIBILITIES TO FOLLOW Performed at [facility], [address] [place], [place] [address]    Report Status PENDING  Incomplete     Labs: Basic Metabolic Panel: Recent Labs  Lab 06/02/20 2140 06/03/20 0352 06/04/20 0329 06/05/20 0617  NA 140 143 139 139  K 3.1* 3.1* 3.9 3.6  CL 105 108 107 106  CO2 27 24 24 25   GLUCOSE 104* 95 139* 104*  BUN 14 16 14 16   CREATININE 0.64 0.59 0.64 0.67  CALCIUM 8.6* 8.6* 8.4* 8.2*  MG  --  2.2  --   --    Liver Function Tests: No results for input(s): AST, ALT, ALKPHOS, BILITOT, PROT, ALBUMIN in the last 168 hours. No results for input(s): LIPASE, AMYLASE  in the last 168 hours. No results for input(s): AMMONIA in the last 168 hours. CBC: Recent Labs  Lab 06/02/20 2140 06/03/20 0352 06/04/20 0329 06/05/20 0854  WBC 12.5* 10.9* 13.7* 15.1*  NEUTROABS  --   --  11.0* 8.3*  HGB 12.3 11.6* 10.4* 10.1*  HCT 38.3 37.0 33.0* 32.4*  MCV 91.6 92.5 92.2 93.1  PLT 290 279 287 272   Cardiac Enzymes: No results for input(s): CKTOTAL, CKMB, CKMBINDEX, TROPONINI in the last 168 hours. BNP: BNP (last 3 results) Recent Labs    12/19/19 1533  BNP 118.3*    ProBNP (last 3 results) No results for input(s): PROBNP in the last 8760 hours.  CBG: No results for input(s): GLUCAP in the last 168 hours.     Signed:  06/07/20 MD.  [facility] 06/05/2020, 3:01 PM

## 2020-06-05 NOTE — Plan of Care (Signed)
Patient discharged to home, waiting on PTAR. Refused SNF.

## 2020-06-06 ENCOUNTER — Encounter (HOSPITAL_COMMUNITY): Payer: Self-pay | Admitting: Orthopedic Surgery

## 2020-06-06 LAB — URINE CULTURE: Culture: 30000 — AB

## 2020-06-20 ENCOUNTER — Other Ambulatory Visit: Payer: Self-pay

## 2020-06-20 ENCOUNTER — Emergency Department (HOSPITAL_COMMUNITY): Payer: Medicare HMO

## 2020-06-20 ENCOUNTER — Inpatient Hospital Stay (HOSPITAL_COMMUNITY)
Admission: EM | Admit: 2020-06-20 | Discharge: 2020-06-23 | DRG: 871 | Disposition: A | Discharge status: Home / Self Care | Payer: Medicare HMO | Attending: Internal Medicine | Admitting: Internal Medicine

## 2020-06-20 ENCOUNTER — Encounter (HOSPITAL_COMMUNITY): Payer: Self-pay

## 2020-06-20 DIAGNOSIS — E538 Deficiency of other specified B group vitamins: Secondary | ICD-10-CM | POA: Diagnosis present

## 2020-06-20 DIAGNOSIS — S82201D Unspecified fracture of shaft of right tibia, subsequent encounter for closed fracture with routine healing: Secondary | ICD-10-CM

## 2020-06-20 DIAGNOSIS — S82401D Unspecified fracture of shaft of right fibula, subsequent encounter for closed fracture with routine healing: Secondary | ICD-10-CM

## 2020-06-20 DIAGNOSIS — A419 Sepsis, unspecified organism: Secondary | ICD-10-CM | POA: Diagnosis present

## 2020-06-20 DIAGNOSIS — Z87891 Personal history of nicotine dependence: Secondary | ICD-10-CM

## 2020-06-20 DIAGNOSIS — E876 Hypokalemia: Secondary | ICD-10-CM | POA: Diagnosis present

## 2020-06-20 DIAGNOSIS — I10 Essential (primary) hypertension: Secondary | ICD-10-CM | POA: Diagnosis present

## 2020-06-20 DIAGNOSIS — Z880 Allergy status to penicillin: Secondary | ICD-10-CM

## 2020-06-20 DIAGNOSIS — M545 Low back pain, unspecified: Secondary | ICD-10-CM | POA: Diagnosis present

## 2020-06-20 DIAGNOSIS — N390 Urinary tract infection, site not specified: Secondary | ICD-10-CM | POA: Diagnosis present

## 2020-06-20 DIAGNOSIS — I69322 Dysarthria following cerebral infarction: Secondary | ICD-10-CM

## 2020-06-20 DIAGNOSIS — Z87442 Personal history of urinary calculi: Secondary | ICD-10-CM

## 2020-06-20 DIAGNOSIS — W19XXXA Unspecified fall, initial encounter: Secondary | ICD-10-CM | POA: Diagnosis present

## 2020-06-20 DIAGNOSIS — I69351 Hemiplegia and hemiparesis following cerebral infarction affecting right dominant side: Secondary | ICD-10-CM

## 2020-06-20 DIAGNOSIS — R451 Restlessness and agitation: Secondary | ICD-10-CM | POA: Diagnosis present

## 2020-06-20 DIAGNOSIS — Z87440 Personal history of urinary (tract) infections: Secondary | ICD-10-CM

## 2020-06-20 DIAGNOSIS — S82201A Unspecified fracture of shaft of right tibia, initial encounter for closed fracture: Secondary | ICD-10-CM | POA: Diagnosis present

## 2020-06-20 DIAGNOSIS — I454 Nonspecific intraventricular block: Secondary | ICD-10-CM | POA: Diagnosis present

## 2020-06-20 DIAGNOSIS — A4151 Sepsis due to Escherichia coli [E. coli]: Secondary | ICD-10-CM | POA: Diagnosis not present

## 2020-06-20 DIAGNOSIS — Z833 Family history of diabetes mellitus: Secondary | ICD-10-CM

## 2020-06-20 DIAGNOSIS — Z9119 Patient's noncompliance with other medical treatment and regimen: Secondary | ICD-10-CM

## 2020-06-20 DIAGNOSIS — Z20822 Contact with and (suspected) exposure to covid-19: Secondary | ICD-10-CM | POA: Diagnosis present

## 2020-06-20 DIAGNOSIS — Z825 Family history of asthma and other chronic lower respiratory diseases: Secondary | ICD-10-CM

## 2020-06-20 DIAGNOSIS — W19XXXD Unspecified fall, subsequent encounter: Secondary | ICD-10-CM | POA: Diagnosis present

## 2020-06-20 DIAGNOSIS — Z7982 Long term (current) use of aspirin: Secondary | ICD-10-CM

## 2020-06-20 DIAGNOSIS — Z79899 Other long term (current) drug therapy: Secondary | ICD-10-CM

## 2020-06-20 DIAGNOSIS — Z6838 Body mass index (BMI) 38.0-38.9, adult: Secondary | ICD-10-CM

## 2020-06-20 DIAGNOSIS — G9341 Metabolic encephalopathy: Secondary | ICD-10-CM | POA: Diagnosis present

## 2020-06-20 DIAGNOSIS — I440 Atrioventricular block, first degree: Secondary | ICD-10-CM | POA: Diagnosis present

## 2020-06-20 DIAGNOSIS — S82401A Unspecified fracture of shaft of right fibula, initial encounter for closed fracture: Secondary | ICD-10-CM | POA: Diagnosis present

## 2020-06-20 DIAGNOSIS — R823 Hemoglobinuria: Secondary | ICD-10-CM | POA: Diagnosis present

## 2020-06-20 DIAGNOSIS — I69392 Facial weakness following cerebral infarction: Secondary | ICD-10-CM

## 2020-06-20 DIAGNOSIS — R0902 Hypoxemia: Secondary | ICD-10-CM | POA: Diagnosis present

## 2020-06-20 DIAGNOSIS — I6930 Unspecified sequelae of cerebral infarction: Secondary | ICD-10-CM

## 2020-06-20 DIAGNOSIS — G8929 Other chronic pain: Secondary | ICD-10-CM | POA: Diagnosis present

## 2020-06-20 DIAGNOSIS — R001 Bradycardia, unspecified: Secondary | ICD-10-CM | POA: Diagnosis not present

## 2020-06-20 DIAGNOSIS — G4733 Obstructive sleep apnea (adult) (pediatric): Secondary | ICD-10-CM | POA: Diagnosis present

## 2020-06-20 DIAGNOSIS — E669 Obesity, unspecified: Secondary | ICD-10-CM | POA: Diagnosis present

## 2020-06-20 DIAGNOSIS — Z888 Allergy status to other drugs, medicaments and biological substances status: Secondary | ICD-10-CM

## 2020-06-20 DIAGNOSIS — E785 Hyperlipidemia, unspecified: Secondary | ICD-10-CM | POA: Diagnosis present

## 2020-06-20 DIAGNOSIS — K219 Gastro-esophageal reflux disease without esophagitis: Secondary | ICD-10-CM | POA: Diagnosis present

## 2020-06-20 DIAGNOSIS — Z90710 Acquired absence of both cervix and uterus: Secondary | ICD-10-CM

## 2020-06-20 LAB — PROTIME-INR
INR: 1.2 (ref 0.8–1.2)
Prothrombin Time: 15 seconds (ref 11.4–15.2)

## 2020-06-20 LAB — CBC WITH DIFFERENTIAL/PLATELET
Abs Immature Granulocytes: 0.08 10*3/uL — ABNORMAL HIGH (ref 0.00–0.07)
Basophils Absolute: 0.1 10*3/uL (ref 0.0–0.1)
Basophils Relative: 0 %
Eosinophils Absolute: 0.5 10*3/uL (ref 0.0–0.5)
Eosinophils Relative: 3 %
HCT: 39 % (ref 36.0–46.0)
Hemoglobin: 12.2 g/dL (ref 12.0–15.0)
Immature Granulocytes: 1 %
Lymphocytes Relative: 15 %
Lymphs Abs: 2.1 10*3/uL (ref 0.7–4.0)
MCH: 28.6 pg (ref 26.0–34.0)
MCHC: 31.3 g/dL (ref 30.0–36.0)
MCV: 91.5 fL (ref 80.0–100.0)
Monocytes Absolute: 0.6 10*3/uL (ref 0.1–1.0)
Monocytes Relative: 4 %
Neutro Abs: 10.3 10*3/uL — ABNORMAL HIGH (ref 1.7–7.7)
Neutrophils Relative %: 77 %
Platelets: 311 10*3/uL (ref 150–400)
RBC: 4.26 MIL/uL (ref 3.87–5.11)
RDW: 15 % (ref 11.5–15.5)
WBC: 13.5 10*3/uL — ABNORMAL HIGH (ref 4.0–10.5)
nRBC: 0 % (ref 0.0–0.2)

## 2020-06-20 LAB — URINALYSIS, ROUTINE W REFLEX MICROSCOPIC
Bilirubin Urine: NEGATIVE
Glucose, UA: NEGATIVE mg/dL
Ketones, ur: NEGATIVE mg/dL
Nitrite: NEGATIVE
Protein, ur: NEGATIVE mg/dL
Specific Gravity, Urine: 1.026 (ref 1.005–1.030)
pH: 5 (ref 5.0–8.0)

## 2020-06-20 LAB — COMPREHENSIVE METABOLIC PANEL
ALT: 11 U/L (ref 0–44)
AST: 16 U/L (ref 15–41)
Albumin: 3.6 g/dL (ref 3.5–5.0)
Alkaline Phosphatase: 137 U/L — ABNORMAL HIGH (ref 38–126)
Anion gap: 7 (ref 5–15)
BUN: 26 mg/dL — ABNORMAL HIGH (ref 8–23)
CO2: 23 mmol/L (ref 22–32)
Calcium: 8.9 mg/dL (ref 8.9–10.3)
Chloride: 113 mmol/L — ABNORMAL HIGH (ref 98–111)
Creatinine, Ser: 0.81 mg/dL (ref 0.44–1.00)
GFR, Estimated: >60 mL/min (ref >60)
Glucose, Bld: 103 mg/dL — ABNORMAL HIGH (ref 70–99)
Potassium: 3.3 mmol/L — ABNORMAL LOW (ref 3.5–5.1)
Sodium: 143 mmol/L (ref 135–145)
Total Bilirubin: 1 mg/dL (ref 0.3–1.2)
Total Protein: 6.6 g/dL (ref 6.5–8.1)

## 2020-06-20 LAB — RESP PANEL BY RT-PCR (FLU A&B, COVID) ARPGX2
Influenza A by PCR: NEGATIVE
Influenza B by PCR: NEGATIVE
SARS Coronavirus 2 by RT PCR: NEGATIVE

## 2020-06-20 LAB — APTT: aPTT: 38 seconds — ABNORMAL HIGH (ref 24–36)

## 2020-06-20 LAB — LACTIC ACID, PLASMA: Lactic Acid, Venous: 1.3 mmol/L (ref 0.5–1.9)

## 2020-06-20 MED ORDER — LACTATED RINGERS IV BOLUS (SEPSIS)
1000.0000 mL | Freq: Once | INTRAVENOUS | Status: AC
  Administered 2020-06-20: 1000 mL via INTRAVENOUS

## 2020-06-20 MED ORDER — LACTATED RINGERS IV SOLN: INTRAVENOUS | Status: DC

## 2020-06-20 MED ORDER — SODIUM CHLORIDE 0.9 % IV SOLN
2.0000 g | INTRAVENOUS | Status: AC
  Administered 2020-06-20: 2 g via INTRAVENOUS
  Filled 2020-06-20: qty 20

## 2020-06-20 MED ORDER — ACETAMINOPHEN 325 MG PO TABS
650.0000 mg | ORAL_TABLET | Freq: Once | ORAL | Status: AC
  Administered 2020-06-20: 650 mg via ORAL
  Filled 2020-06-20: qty 2

## 2020-06-20 MED ORDER — LACTATED RINGERS IV BOLUS (SEPSIS)
200.0000 mL | Freq: Once | INTRAVENOUS | Status: AC
  Administered 2020-06-20: 200 mL via INTRAVENOUS

## 2020-06-20 MED ORDER — VANCOMYCIN HCL 2000 MG/400ML IV SOLN
2000.0000 mg | INTRAVENOUS | Status: AC
  Administered 2020-06-20: 2000 mg via INTRAVENOUS
  Filled 2020-06-20: qty 400

## 2020-06-20 MED ORDER — VANCOMYCIN HCL IN DEXTROSE 1-5 GM/200ML-% IV SOLN: 1000.0000 mg | Freq: Once | INTRAVENOUS | Status: DC

## 2020-06-20 MED ORDER — LEVOFLOXACIN IN D5W 750 MG/150ML IV SOLN: 750.0000 mg | Freq: Once | INTRAVENOUS | Status: DC

## 2020-06-20 NOTE — ED Provider Notes (Addendum)
22:00: Assumed care of patient from PA [facility] @ change of shift pending head CT & admission.  Please see prior provider note for full H&P.  Briefly patient is a 65 yo female who presented to the ED for evaluation of AMS, fever, and dyspnea.   On arrival patient found to be febrile, concern for sepsis, noted to have leukocytosis & findings consistent with UTI. There was also a question of possible lower extremity cellulitis.   Abx & fluids were started by prior team.  Case was disucssed with hospitalist Dr. Robb Matar who is requesting head CT prior to admission.   Physical Exam  BP (!) 117/54   Pulse (!) 51   Temp (!) 101.7 F (38.7 C) (Rectal)   Resp 17   Ht 5\' 9"  (1.753 m)   Wt 117 kg   SpO2 94%   BMI 38.09 kg/m   Physical Exam Vitals and nursing note reviewed.  Cardiovascular:     Rate and Rhythm: Normal rate.  Pulmonary:     Effort: No respiratory distress.  Neurological:     Mental Status: She is alert.     Comments: Alert. Oriented to person and place, disoriented to time and situation. States it is 2001. States there is nothing wrong with her and she does not need to be in the hospital. Making random statements and typing messages that do not make sense on her cell phone and showing them to me.     ED Course/Procedures   Pending head CT.   Discussed with technician regarding delay in imaging- he relays that patient is refusing her head CT. Upon my reassessment of the patient she is agitated, she is pulling at different monitors trying to take them off. She states that she will not have her head CT unless she gets her nighttime medications. I discussed administration of her medications, she cannot tell me what her medications are, I reviewed them in the EMR with her and discussed administration of some of these with subsequent head CT, and she then refused her nighttime medications and the CT during discussion, very volatile during our conversation, at times seems more  oriented then very confused, she continues to shout tangentially. I attempted on multiple occassions to sit at the bedside with her and explain purpose of requested imaging without success. She however has a hx of AMS with UTIs and has findings of UTI on her work-up, therefore case was re-discussed with Dr. 2002, relays question of capacity to refuse and feels head CT is necessary prior to admission.   I called her husband with him on speaker phone in the room with the patient without much progress in patient's care as she continued to shout that she was not having imaging. I subsequently called and spoke to her husband privately who states she does still seem confused to him, I discussed concern about her decision making capacity, he is in agreement that he would like her to have the CT imaging, he states she has gotten agitated & refused care with prior infectious processes and this typically improves within a day or so after starting tx.  Again attempted to explain patient's care, also discussed haldol administration to help calm patient and to proceed with CT- Patient again refusing, getting agitated more so, threatening toward staff, at this point concern she is a potential danger to her self with a degree of encephalopathy, will IVC and proceed with administration of haldol - per discussion w/ attending.   01:50: Patient continuing  to shout with agitation, additional dose of haldol ordered.   02:10: Patient transported to CT by radiology technician and myself.   CT head wo:  IMPRESSION:  No acute intracranial abnormality. Mild senescent change. Multiple  remote infarcts, stable since prior examination.   02:34: CONSULT: Discussed with hospitalist Dr. Robb Matar- accepts admission.   Findings and plan of care discussed with supervising physician Dr. Estell Harpin & subsequently Dr. Nicanor Alcon @ change of shift who have provided guidance & are in agreement.      Desmond Lope 06/21/20  0515    Palumbo, April, MD 06/21/20 0543    Cherly Anderson, PA-C 06/21/20 1611    Palumbo, April, MD 06/21/20 2305

## 2020-06-20 NOTE — ED Triage Notes (Signed)
Pt brought in by EMS for shortness of breath, fever and altered mental status according to her husband. EMS reports room air 02 of 88% with increased WOB. Pt denies CP. Has complaints of pain to R leg s/p recent fracture and surgical repair.

## 2020-06-20 NOTE — Sepsis Progress Note (Signed)
eLink is monitoring this Code Sepsis. °

## 2020-06-20 NOTE — ED Provider Notes (Addendum)
[facility]-EMERGENCY DEPT Provider Note   CSN: [id number] Arrival date & time: 06/20/20  1758     History Chief Complaint  Patient presents with  . Shortness of Breath    Heidi Green is a 65 y.o. female.  HPI   Patient is a 65 year old female with a medical history as noted below.  Patient had an open reduction and internal fixation of her right tibia on February 19.  Per EMS notes, her husband called EMS today because patient was having increased dyspnea and had oxygen saturations in the high 80s.  He also felt that her mental status was declining and she was febrile.  Patient oriented to self but otherwise unsure of the year, location, or current president.  Appears to be able to move all 4 extremities. She notes a history of UTIs but denies any urinary sx. Denies any CP or SOB. Per records, patient has a history of venous stasis dermatitis.  Patient's husband is now at bedside.  He states that she is being increasingly altered for the past 24 hours.  He states this is typical when she has infections, particularly UTIs which he states she has had many times in the past.  He states that since her surgery she has been having a significant amount of mobility issues and could not even get down her steps today when EMS arrived.  Level 5 caveat due to altered mental status.     Past Medical History:  Diagnosis Date  . Arthritis    in back  . Back pain   . Chest pain 06/16/2012   negative cath with normal LV function  . Headache(784.0)   . Hypertension   . 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 Kidney stones   . Sleep apnea   . Stroke [facility])     Patient Active Problem List   Diagnosis Date Noted  . Folate deficiency 06/05/2020  . B12 deficiency 06/05/2020  . Leukocytosis   . Gastroesophageal reflux disease   . Tibia/fibula fracture 06/02/2020  . OSA (obstructive sleep apnea) 06/02/2020  . Acute respiratory failure  with hypoxia (HCC) 12/19/2019  . History of CVA with residual deficit 12/19/2019  . Severe sepsis with acute organ dysfunction (HCC) 12/19/2019  . Sepsis due to cellulitis (HCC) 08/19/2018  . Hypotension 08/19/2018  . Chronic low back pain 08/19/2018  . HCAP (healthcare-associated pneumonia) 11/11/2017  . Acute lower UTI 11/11/2017  . AKI (acute kidney injury) (HCC) 11/11/2017  . Lactic acid acidosis 11/11/2017  . Severe sepsis (HCC) 11/11/2017  . Community acquired bilateral lower lobe pneumonia 10/04/2017  . Hypokalemia 08/01/2016  . Class 2 obesity   . HTN (hypertension) 06/16/2012  . Dyslipidemia 06/16/2012  . Back pain 06/16/2012    Past Surgical History:  Procedure Laterality Date  . ABDOMINAL HYSTERECTOMY    . BACK SURGERY     x 3  . Left ACL repair  2004  . LEFT HEART CATHETERIZATION WITH CORONARY ANGIOGRAM N/A 06/16/2012   Procedure: LEFT HEART CATHETERIZATION WITH CORONARY ANGIOGRAM;  Surgeon: Peter M Swaziland, MD;  Location: [facility] CATH LAB;  Service: Cardiovascular;  Laterality: N/A;  . lipoma excision from neck  2006  . LITHOTRIPSY    . ORIF TIBIA FRACTURE Right 06/03/2020   Procedure: OPEN REDUCTION INTERNAL FIXATION (ORIF) TIBIA FRACTURE;  Surgeon: Samson Frederic, MD;  Location: [facility] ORS;  Service: Orthopedics;  Laterality: Right;  . TUBAL LIGATION  OB History   No obstetric history on file.     Family History  Problem Relation Age of Onset  . Emphysema Mother   . Diabetes Mother     Social History   Tobacco Use  . Smoking status: Former Smoker    Packs/day: 0.50    Years: 26.00    Pack years: 13.00    Types: Cigarettes    Quit date: 06/17/2012    Years since quitting: 8.0  . Smokeless tobacco: Never Used  Substance Use Topics  . Alcohol use: No  . Drug use: No    Home Medications Prior to Admission medications   Medication Sig Start Date End Date Taking? Authorizing Provider  albuterol (VENTOLIN HFA) 108 (90 Base) MCG/ACT inhaler Inhale 2 puffs  into the lungs every 6 (six) hours as needed for wheezing or shortness of breath. 12/22/19   Rhetta Mura, MD  aspirin (ASPIRIN CHILDRENS) 81 MG chewable tablet Chew 1 tablet (81 mg total) by mouth 2 (two) times daily with a meal. 06/05/20 07/20/20  Swinteck, Arlys John, MD  diclofenac Sodium (VOLTAREN) 1 % GEL Apply 2 g topically 4 (four) times daily. 05/24/20   [provider]  esomeprazole (NEXIUM) 40 MG capsule Take 40 mg by mouth daily with supper.  08/18/18   [provider]  ezetimibe (ZETIA) 10 MG tablet Take 10 mg by mouth daily with supper. 11/11/19   [provider]  FEROSUL 325 (65 Fe) MG tablet Take 325 mg by mouth 2 (two) times daily with a meal.  10/15/19   [provider]  folic acid (FOLVITE) 1 MG tablet Take 1 tablet (1 mg total) by mouth daily. 06/06/20   Rodolph Bong, MD  HYDROcodone-acetaminophen [facility]) 10-325 MG tablet Take 1 tablet by mouth every 4 (four) hours as needed for moderate pain or severe pain. 06/05/20   Swinteck, Arlys John, MD  lisinopril (ZESTRIL) 30 MG tablet Take 1 tablet (30 mg total) by mouth daily. 06/08/20   Rodolph Bong, MD  oxybutynin (DITROPAN-XL) 5 MG 24 hr tablet Take 5 mg by mouth daily. 01/08/20   [provider]  pregabalin (LYRICA) 150 MG capsule Take 150 mg by mouth 2 (two) times daily.    [provider]  rOPINIRole (REQUIP) 2 MG tablet Take 2 mg by mouth 2 (two) times daily.     [provider]  senna-docusate (SENOKOT-S) 8.6-50 MG tablet Take 1 tablet by mouth 2 (two) times daily. 06/05/20   Rodolph Bong, MD  vitamin B-12 (CYANOCOBALAMIN) 1000 MCG tablet Take 1 tablet (1,000 mcg total) by mouth daily. 06/05/20   Rodolph Bong, MD  Vitamin D, Ergocalciferol, (DRISDOL) 50000 units CAPS capsule Take 50,000 Units by mouth every Sunday.  10/31/17   [provider]  zolpidem (AMBIEN) 10 MG tablet Take 5 mg by mouth at bedtime. 12/10/19   [provider]    Allergies     Buprenorphine, Garamycin [gentamicin sulfate], Other, Penicillins, Atorvastatin, Norvasc [amlodipine besylate], and Rosuvastatin  Review of Systems   Review of Systems  Unable to perform ROS: Mental status change   Physical Exam Updated Vital Signs BP (!) 109/59   Pulse (!) 51   Temp (!) 101.7 F (38.7 C) (Rectal)   Resp (!) 21   Ht 5\' 9"  (1.753 m)   Wt 117 kg   SpO2 96%   BMI 38.09 kg/m   Physical Exam Vitals and nursing note reviewed.  Constitutional:      General: She  is not in acute distress.    Appearance: Normal appearance. She is obese. She is not ill-appearing, toxic-appearing or diaphoretic.  HENT:     Head: Normocephalic and atraumatic.     Right Ear: External ear normal.     Left Ear: External ear normal.     Nose: Nose normal.     Mouth/Throat:     Mouth: Mucous membranes are moist.     Pharynx: Oropharynx is clear. No oropharyngeal exudate or posterior oropharyngeal erythema.  Eyes:     Extraocular Movements: Extraocular movements intact.  Cardiovascular:     Rate and Rhythm: Normal rate and regular rhythm.     Pulses: Normal pulses.     Heart sounds: Normal heart sounds. No murmur heard. No friction rub. No gallop.   Pulmonary:     Effort: Pulmonary effort is normal. No respiratory distress.     Breath sounds: Normal breath sounds. No stridor. No decreased breath sounds, wheezing, rhonchi or rales.  Abdominal:     General: Abdomen is flat.     Tenderness: There is no abdominal tenderness.  Musculoskeletal:        General: Normal range of motion.     Cervical back: Normal range of motion and neck supple. No tenderness.     Right lower leg: Tenderness present. Edema present.     Left lower leg: No tenderness. No edema.     Comments: See images below. Swelling and erythema noted in the RLE compared to the left. No significant TTP noted.  Palpable pedal pulses.  Increased warmth noted circumferentially in the right lower leg.  Skin:    General: Skin is  warm and dry.     Findings: Erythema present.     Comments: See images below.  Neurological:     Mental Status: She is alert.  Psychiatric:        Mood and Affect: Mood normal.        Behavior: Behavior normal.        ED Results / Procedures / Treatments   Labs (all labs ordered are listed, but only abnormal results are displayed) Labs Reviewed  COMPREHENSIVE METABOLIC PANEL - Abnormal; Notable for the following components:      Result Value   Potassium 3.3 (*)    Chloride 113 (*)    Glucose, Bld 103 (*)    BUN 26 (*)    Alkaline Phosphatase 137 (*)    All other components within normal limits  CBC WITH DIFFERENTIAL/PLATELET - Abnormal; Notable for the following components:   WBC 13.5 (*)    Neutro Abs 10.3 (*)    Abs Immature Granulocytes 0.08 (*)    All other components within normal limits  APTT - Abnormal; Notable for the following components:   aPTT 38 (*)    All other components within normal limits  URINALYSIS, ROUTINE W REFLEX MICROSCOPIC - Abnormal; Notable for the following components:   Color, Urine AMBER (*)    APPearance HAZY (*)    Hgb urine dipstick MODERATE (*)    Leukocytes,Ua TRACE (*)    Bacteria, UA MANY (*)    Crystals PRESENT (*)    All other components within normal limits  RESP PANEL BY RT-PCR (FLU A&B, COVID) ARPGX2  CULTURE, BLOOD (ROUTINE X 2)  CULTURE, BLOOD (ROUTINE X 2)  URINE CULTURE  LACTIC ACID, PLASMA  PROTIME-INR   EKG None  Radiology DG Chest Port 1 View  Result Date: 06/20/2020 CLINICAL DATA:  Fever and shortness  of breath. EXAM: PORTABLE CHEST 1 VIEW COMPARISON:  Chest x-ray dated June 04, 2020. FINDINGS: The heart size and mediastinal contours are within normal limits. Normal pulmonary vascularity. Low lung volumes with mild bibasilar atelectasis. No pleural effusion or pneumothorax. No acute osseous abnormality. IMPRESSION: 1. Low lung volumes with mild bibasilar atelectasis. Electronically Signed   By: Obie Dredge  M.D.   On: 06/20/2020 19:19   Procedures .Critical Care Performed by: Placido Sou, PA-C Authorized by: Placido Sou, PA-C   Critical care provider statement:    Critical care time (minutes):  45   Critical care was necessary to treat or prevent imminent or life-threatening deterioration of the following conditions:  Sepsis   Critical care was time spent personally by me on the following activities:  Discussions with consultants, evaluation of patient's response to treatment, examination of patient, ordering and performing treatments and interventions, ordering and review of laboratory studies, ordering and review of radiographic studies, pulse oximetry, re-evaluation of patient's condition, obtaining history from patient or surrogate and review of old charts    Medications Ordered in ED Medications  lactated ringers infusion (0 mLs Intravenous Hold 06/20/20 1855)  lactated ringers bolus 1,000 mL (0 mLs Intravenous Stopped 06/20/20 2040)    And  lactated ringers bolus 1,000 mL (0 mLs Intravenous Stopped 06/20/20 2150)    And  lactated ringers bolus 200 mL (0 mLs Intravenous Hold 06/20/20 1924)  vancomycin (VANCOREADY) IVPB 2000 mg/400 mL (2,000 mg Intravenous New Bag/Given 06/20/20 1957)  cefTRIAXone (ROCEPHIN) 2 g in sodium chloride 0.9 % 100 mL IVPB (0 g Intravenous Stopped 06/20/20 1956)  acetaminophen (TYLENOL) tablet 650 mg (650 mg Oral Given 06/20/20 1917)   ED Course  I have reviewed the triage vital signs and the nursing notes.  Pertinent labs & imaging results that were available during my care of the patient were reviewed by me and considered in my medical decision making (see chart for details).  Clinical Course as of 06/20/20 2156  Tue Jun 20, 2020  8182 Rectal temp of 101.7. [LJ]  1951 WBC(!): 13.5 [LJ]  1951 NEUT#(!): 10.3 [LJ]  1951 Abs Immature Granulocytes(!): 0.08 [LJ]  2046 Bacteria, UA(!): MANY [LJ]  2046 Leukocytes,Ua(!): TRACE [LJ]  2046 WBC, UA: 21-50 [LJ]     Clinical Course User Index [LJ] Jannet Mantis   MDM Rules/Calculators/A&P                          Patient is a 65 year old female who presents the emergency department today due to what appears to be sepsis.  Patient acutely altered for the past 24 hours.  Found to be febrile with leukocytosis and neutrophilia.  Patient given IV fluids, Rocephin, vancomycin.  Unsure of the source of the patient's infection.  She does have new edema in the right lower extremity and also had surgery to the right leg in February.  Possibly a developing cellulitis.  UA also showing many bacteria, trace leukocytes, 21-50 white blood cells.  Likely urosepsis.  Feel that patient's condition warrants admission at this time.  Will discuss with the medicine team.  Patient discussed with the hospitalist.  They requested that we obtain a CT scan of her head and then reconsult them for admission. It is the end of my shift and pt care being transferred to [facility], PA-C.  Patient pending CT scan of her head.  When this is resulted patient can then be admitted for sepsis.  Final  Clinical Impression(s) / ED Diagnoses Final diagnoses:  Sepsis, due to unspecified organism, unspecified whether acute organ dysfunction present [facility](HCC)   Rx / DC Orders ED Discharge Orders    None       Placido SouJoldersma, Mahli Glahn, PA-C 06/20/20 2137    Placido SouJoldersma, Annalyssa Thune, PA-C 06/20/20 2156    Bethann BerkshireZammit, Joseph, MD 06/20/20 2311

## 2020-06-20 NOTE — Progress Notes (Signed)
A consult was received from an ED physician for Vancomycin and Levofloxacin per pharmacy dosing for cellulitis.    For Levofloxacin, Pharmacist requested to investigate beta-lactam allergy. If history of intolerance, mild allergy, or documented history of use of cephalosporins, pharmacy can adjust levofloxacin to ceftriaxone.  Patient's allergy list includes Pencillin with mention patient has taken both amoxicillin and ceftriaxone without issue.  Per Epic, patient received Ceftriaxone in October 2021 with no noted adverse effect.  The patient's profile has been reviewed for ht/wt/allergies/indication/available labs.   Height = 69 inches Weight = 117 kg BMI = 38  A one time order has been placed for Vancomycin 2gm and Ceftriaxone 2gm IV.    Further antibiotics/pharmacy consults should be ordered by admitting physician if indicated.                       Thank you, Maryellen Pile, PharmD 06/20/2020  6:48 PM

## 2020-06-21 ENCOUNTER — Observation Stay (HOSPITAL_BASED_OUTPATIENT_CLINIC_OR_DEPARTMENT_OTHER): Payer: Medicare HMO

## 2020-06-21 ENCOUNTER — Emergency Department (HOSPITAL_COMMUNITY): Payer: Medicare HMO

## 2020-06-21 ENCOUNTER — Other Ambulatory Visit: Payer: Self-pay

## 2020-06-21 ENCOUNTER — Encounter (HOSPITAL_COMMUNITY): Payer: Self-pay

## 2020-06-21 DIAGNOSIS — R001 Bradycardia, unspecified: Secondary | ICD-10-CM | POA: Diagnosis present

## 2020-06-21 DIAGNOSIS — A419 Sepsis, unspecified organism: Secondary | ICD-10-CM | POA: Diagnosis present

## 2020-06-21 DIAGNOSIS — M7989 Other specified soft tissue disorders: Secondary | ICD-10-CM

## 2020-06-21 LAB — COMPREHENSIVE METABOLIC PANEL
ALT: 7 U/L (ref 0–44)
AST: 13 U/L — ABNORMAL LOW (ref 15–41)
Albumin: 2.4 g/dL — ABNORMAL LOW (ref 3.5–5.0)
Alkaline Phosphatase: 93 U/L (ref 38–126)
Anion gap: 5 (ref 5–15)
BUN: 18 mg/dL (ref 8–23)
CO2: 17 mmol/L — ABNORMAL LOW (ref 22–32)
Calcium: 6.5 mg/dL — ABNORMAL LOW (ref 8.9–10.3)
Chloride: 121 mmol/L — ABNORMAL HIGH (ref 98–111)
Creatinine, Ser: 0.43 mg/dL — ABNORMAL LOW (ref 0.44–1.00)
GFR, Estimated: >60 mL/min (ref >60)
Glucose, Bld: 76 mg/dL (ref 70–99)
Potassium: 4 mmol/L (ref 3.5–5.1)
Sodium: 143 mmol/L (ref 135–145)
Total Bilirubin: 0.9 mg/dL (ref 0.3–1.2)
Total Protein: 4.4 g/dL — ABNORMAL LOW (ref 6.5–8.1)

## 2020-06-21 LAB — CBC
HCT: 36 % (ref 36.0–46.0)
Hemoglobin: 11.1 g/dL — ABNORMAL LOW (ref 12.0–15.0)
MCH: 28.5 pg (ref 26.0–34.0)
MCHC: 30.8 g/dL (ref 30.0–36.0)
MCV: 92.5 fL (ref 80.0–100.0)
Platelets: 231 10*3/uL (ref 150–400)
RBC: 3.89 MIL/uL (ref 3.87–5.11)
RDW: 14.8 % (ref 11.5–15.5)
WBC: 10.7 10*3/uL — ABNORMAL HIGH (ref 4.0–10.5)
nRBC: 0 % (ref 0.0–0.2)

## 2020-06-21 LAB — PHOSPHORUS: Phosphorus: 2.6 mg/dL (ref 2.5–4.6)

## 2020-06-21 LAB — GLUCOSE, CAPILLARY: Glucose-Capillary: 79 mg/dL (ref 70–99)

## 2020-06-21 LAB — MAGNESIUM: Magnesium: 1.6 mg/dL — ABNORMAL LOW (ref 1.7–2.4)

## 2020-06-21 LAB — ABO/RH: ABO/RH(D): O NEG

## 2020-06-21 LAB — MRSA PCR SCREENING: MRSA by PCR: NEGATIVE

## 2020-06-21 LAB — PREPARE RBC (CROSSMATCH)

## 2020-06-21 MED ORDER — MAGNESIUM SULFATE 2 GM/50ML IV SOLN
2.0000 g | Freq: Once | INTRAVENOUS | Status: AC
  Administered 2020-06-21: 2 g via INTRAVENOUS
  Filled 2020-06-21: qty 50

## 2020-06-21 MED ORDER — POTASSIUM CHLORIDE IN NACL 40-0.9 MEQ/L-% IV SOLN
INTRAVENOUS | Status: AC
  Filled 2020-06-21: qty 1000

## 2020-06-21 MED ORDER — PROCHLORPERAZINE EDISYLATE 10 MG/2ML IJ SOLN: 5.0000 mg | INTRAMUSCULAR | Status: DC | PRN

## 2020-06-21 MED ORDER — EZETIMIBE 10 MG PO TABS
10.0000 mg | ORAL_TABLET | Freq: Every day | ORAL | Status: DC
  Administered 2020-06-21 – 2020-06-22 (×2): 10 mg via ORAL
  Filled 2020-06-21 (×3): qty 1

## 2020-06-21 MED ORDER — SODIUM CHLORIDE 0.9 % IV SOLN
2.0000 g | INTRAVENOUS | Status: DC
  Administered 2020-06-21 – 2020-06-22 (×2): 2 g via INTRAVENOUS
  Filled 2020-06-21 (×3): qty 20

## 2020-06-21 MED ORDER — CHLORHEXIDINE GLUCONATE CLOTH 2 % EX PADS
6.0000 | MEDICATED_PAD | Freq: Every day | CUTANEOUS | Status: DC
  Administered 2020-06-21 – 2020-06-22 (×2): 6 via TOPICAL

## 2020-06-21 MED ORDER — ATROPINE SULFATE 1 MG/10ML IJ SOSY
0.5000 mg | PREFILLED_SYRINGE | INTRAMUSCULAR | Status: AC | PRN
  Administered 2020-06-21: 0.5 mg via INTRAVENOUS
  Filled 2020-06-21: qty 10

## 2020-06-21 MED ORDER — FAMOTIDINE 20 MG PO TABS
20.0000 mg | ORAL_TABLET | Freq: Every day | ORAL | Status: DC
  Administered 2020-06-21 – 2020-06-22 (×2): 20 mg via ORAL
  Filled 2020-06-21 (×2): qty 1

## 2020-06-21 MED ORDER — HYDROCODONE-ACETAMINOPHEN 10-325 MG PO TABS
1.0000 | ORAL_TABLET | Freq: Four times a day (QID) | ORAL | Status: DC | PRN
Start: 2020-06-21 — End: 2020-06-23
  Administered 2020-06-22 – 2020-06-23 (×4): 1 via ORAL
  Filled 2020-06-21 (×5): qty 1

## 2020-06-21 MED ORDER — FERROUS SULFATE 325 (65 FE) MG PO TABS
325.0000 mg | ORAL_TABLET | Freq: Every day | ORAL | Status: DC
  Administered 2020-06-22 – 2020-06-23 (×2): 325 mg via ORAL
  Filled 2020-06-21 (×2): qty 1

## 2020-06-21 MED ORDER — ROPINIROLE HCL 1 MG PO TABS
2.0000 mg | ORAL_TABLET | Freq: Two times a day (BID) | ORAL | Status: DC
  Administered 2020-06-21: 2 mg via ORAL
  Filled 2020-06-21: qty 2

## 2020-06-21 MED ORDER — HALOPERIDOL LACTATE 5 MG/ML IJ SOLN
2.0000 mg | Freq: Once | INTRAMUSCULAR | Status: AC
  Administered 2020-06-21: 2 mg via INTRAVENOUS
  Filled 2020-06-21: qty 1

## 2020-06-21 MED ORDER — LISINOPRIL 10 MG PO TABS
30.0000 mg | ORAL_TABLET | Freq: Every day | ORAL | Status: DC
  Administered 2020-06-21 – 2020-06-23 (×3): 30 mg via ORAL
  Filled 2020-06-21 (×2): qty 3
  Filled 2020-06-21: qty 1

## 2020-06-21 MED ORDER — ALBUTEROL SULFATE HFA 108 (90 BASE) MCG/ACT IN AERS: 2.0000 | INHALATION_SPRAY | Freq: Four times a day (QID) | RESPIRATORY_TRACT | Status: DC | PRN

## 2020-06-21 MED ORDER — ZOLPIDEM TARTRATE 5 MG PO TABS
5.0000 mg | ORAL_TABLET | Freq: Every day | ORAL | Status: DC
Start: 2020-06-21 — End: 2020-06-23

## 2020-06-21 MED ORDER — SENNOSIDES-DOCUSATE SODIUM 8.6-50 MG PO TABS: 1.0000 | ORAL_TABLET | Freq: Every day | ORAL | Status: DC | PRN

## 2020-06-21 MED ORDER — SODIUM CHLORIDE 0.9% IV SOLUTION: Freq: Once | INTRAVENOUS | Status: DC

## 2020-06-21 MED ORDER — POTASSIUM CHLORIDE IN NACL 40-0.9 MEQ/L-% IV SOLN
INTRAVENOUS | Status: DC
  Administered 2020-06-22: 50 mL/h via INTRAVENOUS
  Filled 2020-06-21 (×4): qty 1000

## 2020-06-21 MED ORDER — ATROPINE SULFATE 1 MG/10ML IJ SOSY
PREFILLED_SYRINGE | INTRAMUSCULAR | Status: AC
  Administered 2020-06-21: 0.5 mg via INTRAVENOUS
  Filled 2020-06-21: qty 10

## 2020-06-21 MED ORDER — PREGABALIN 75 MG PO CAPS
150.0000 mg | ORAL_CAPSULE | Freq: Two times a day (BID) | ORAL | Status: DC
  Administered 2020-06-21: 150 mg via ORAL
  Filled 2020-06-21: qty 3

## 2020-06-21 MED ORDER — FOLIC ACID 1 MG PO TABS
1.0000 mg | ORAL_TABLET | Freq: Every day | ORAL | Status: DC
  Administered 2020-06-21 – 2020-06-23 (×3): 1 mg via ORAL
  Filled 2020-06-21 (×3): qty 1

## 2020-06-21 MED ORDER — VITAMIN B-12 1000 MCG PO TABS
1000.0000 ug | ORAL_TABLET | Freq: Every day | ORAL | Status: DC
  Administered 2020-06-22 – 2020-06-23 (×2): 1000 ug via ORAL
  Filled 2020-06-21 (×3): qty 1

## 2020-06-21 MED ORDER — ACETAMINOPHEN 325 MG PO TABS
650.0000 mg | ORAL_TABLET | Freq: Four times a day (QID) | ORAL | Status: DC | PRN
  Administered 2020-06-21 – 2020-06-22 (×4): 650 mg via ORAL
  Filled 2020-06-21 (×4): qty 2

## 2020-06-21 MED ORDER — OXYBUTYNIN CHLORIDE ER 5 MG PO TB24
5.0000 mg | ORAL_TABLET | Freq: Every day | ORAL | Status: DC
Start: 2020-06-21 — End: 2020-06-23
  Administered 2020-06-21 – 2020-06-23 (×3): 5 mg via ORAL
  Filled 2020-06-21 (×3): qty 1

## 2020-06-21 MED ORDER — PANTOPRAZOLE SODIUM 40 MG PO TBEC
40.0000 mg | DELAYED_RELEASE_TABLET | Freq: Every day | ORAL | Status: DC
  Administered 2020-06-21 – 2020-06-23 (×3): 40 mg via ORAL
  Filled 2020-06-21 (×3): qty 1

## 2020-06-21 MED ORDER — VITAMIN D (ERGOCALCIFEROL) 1.25 MG (50000 UNIT) PO CAPS: 50000.0000 [IU] | ORAL_CAPSULE | ORAL | Status: DC

## 2020-06-21 MED ORDER — ACETAMINOPHEN 650 MG RE SUPP: 650.0000 mg | Freq: Four times a day (QID) | RECTAL | Status: DC | PRN

## 2020-06-21 MED ORDER — ENOXAPARIN SODIUM 40 MG/0.4ML ~~LOC~~ SOLN: 40.0000 mg | SUBCUTANEOUS | Status: DC

## 2020-06-21 MED ORDER — DICLOFENAC SODIUM 1 % EX GEL
2.0000 g | Freq: Four times a day (QID) | CUTANEOUS | Status: DC
  Administered 2020-06-21 – 2020-06-23 (×7): 2 g via TOPICAL
  Filled 2020-06-21: qty 100

## 2020-06-21 NOTE — H&P (Signed)
History and Physical    Heidi Green IEP:[id number] DOB: [birth year] DOA: 06/20/2020  PCP: Chiquita Loth, PA  Patient coming from: Home.  I have personally briefly reviewed patient's old medical records in [facility]  Chief Complaint: Shortness of breath, fever and AMS.  HPI: Heidi Green is a 65 y.o. female with medical history significant of osteoarthritis, chronic lower back pain, history of chest pain with negative cath and normal LV function, headaches, hypertension, hyperlipidemia, class II obesity, history of intracranial hemorrhage, urolithiasis, sleep apnea does not wear CPAP, GERD, B12 deficiency who was recently admitted and discharged last month due to tibia/fibula fracture undergoing intramedullary fixation of the right tibia/fibula who is coming to the emergency department via EMS due to shortness of breath, fever and altered mental status, which her husband stated earlier is her typical presentation when she is getting a urine tract infection.  She was initially altered, then very restless, wanted to be discharged and received haloperidol.  In the last month encounter, while in the hospital, she refused Lovenox administration.  She was insistent about being discharged and was cleared by orthopedics.  ED Course: Initial vital signs were temperature 99.3 F, then increased to 101.7 F 22 minutes later, pulse 85, respirations 20, O2 sat 96% on 3 LPM via [place] and BP 135/60 mmHg.  The patient received 2000 mL of NS bolus and 2 g of ceftriaxone IVPB.  The patient became bradycardic while she was sedated and sleeping.  Atropine was ordered with good response.  Labwork: Her urinalysis showed moderate hemoglobinuria, trace leukocyte esterase, 21-50 WBC and many bacteria on microscopic examination.  There were also the presence of mucus, hyaline casts and crystals.  CBC showed white count of 13.5 with 77% neutrophils, 15% lymphocytes and 4% monocytes.  Hemoglobin 12.2 g/dL and platelets 660.   PT 15.0, INR 1.2 and PTT 38.  CMP showed a potassium of 3.3 and chloride of 113 mmol/L.  All other electrolytes were normal.  Glucose 103 and BUN 26 mg/dL.  Alkaline phosphatase 137 units/L.  The rest of the CMP values are normal.  Lactic acid was normal.  Imaging: A portable 1 view chest radiograph showed low lung volumes with mild bibasilar atelectasis.  CT head without contrast did not show any acute intracranial abnormality please see images and full values report for further detail..  Review of Systems: As per HPI otherwise all other systems reviewed and are negative.  Past Medical History:  Diagnosis Date  . Arthritis    in back  . Back pain   . Chest pain 06/16/2012   negative cath with normal LV function  . Headache(784.0)   . Hypertension   . 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 Kidney stones   . Sleep apnea   . Stroke [facility])    Past Surgical History:  Procedure Laterality Date  . ABDOMINAL HYSTERECTOMY    . BACK SURGERY     x 3  . Left ACL repair  2004  . LEFT HEART CATHETERIZATION WITH CORONARY ANGIOGRAM N/A 06/16/2012   Procedure: LEFT HEART CATHETERIZATION WITH CORONARY ANGIOGRAM;  Surgeon: Peter M Swaziland, MD;  Location: [facility] CATH LAB;  Service: Cardiovascular;  Laterality: N/A;  . lipoma excision from neck  2006  . LITHOTRIPSY    . ORIF TIBIA FRACTURE Right 06/03/2020   Procedure: OPEN REDUCTION INTERNAL FIXATION (ORIF) TIBIA FRACTURE;  Surgeon: Samson Frederic, MD;  Location: [facility] ORS;  Service: Orthopedics;  Laterality: Right;  . TUBAL LIGATION     Social History  reports that she quit smoking about 8 years ago. Her smoking use included cigarettes. She has a 13.00 pack-year smoking history. She has never used smokeless tobacco. She reports that she does not drink alcohol and does not use drugs.  Allergies  Allergen Reactions  . Buprenorphine Itching  . Garamycin [Gentamicin Sulfate] Hives and Shortness Of Breath  . Other  Anaphylaxis    Reaction to HORSE SERUM  . Penicillins Anaphylaxis, Itching and Swelling    Swelling and shortness of breath after taking penicillin for a tooth infection when patient was 24. Per her account, she believes that she has taken amoxicillin and cephalosporins since that time and had no issues.   . Atorvastatin Nausea And Vomiting  . Norvasc [Amlodipine Besylate] Nausea And Vomiting  . Rosuvastatin Diarrhea and Other (See Comments)    Spasms in hand    Family History  Problem Relation Age of Onset  . Emphysema Mother   . Diabetes Mother    Prior to Admission medications   Medication Sig Start Date End Date Taking? Authorizing Provider  albuterol (VENTOLIN HFA) 108 (90 Base) MCG/ACT inhaler Inhale 2 puffs into the lungs every 6 (six) hours as needed for wheezing or shortness of breath. 12/22/19  Yes Rhetta Mura, MD  aspirin (ASPIRIN CHILDRENS) 81 MG chewable tablet Chew 1 tablet (81 mg total) by mouth 2 (two) times daily with a meal. Patient taking differently: Chew 81 mg by mouth daily. 06/05/20 07/20/20 Yes Swinteck, Arlys John, MD  diclofenac Sodium (VOLTAREN) 1 % GEL Apply 2 g topically 4 (four) times daily. 05/24/20  Yes [provider]  esomeprazole (NEXIUM) 40 MG capsule Take 40 mg by mouth 2 (two) times daily before a meal. 08/18/18  Yes [provider]  ezetimibe (ZETIA) 10 MG tablet Take 10 mg by mouth daily with supper. 11/11/19  Yes [provider]  famotidine (PEPCID) 10 MG tablet Take 10 mg by mouth at bedtime.   Yes [provider]  FEROSUL 325 (65 Fe) MG tablet Take 325 mg by mouth daily with breakfast. 10/15/19  Yes [provider]  folic acid (FOLVITE) 1 MG tablet Take 1 tablet (1 mg total) by mouth daily. 06/06/20  Yes Rodolph Bong, MD  HYDROcodone-acetaminophen [facility]) 10-325 MG tablet Take 1 tablet by mouth every 4 (four) hours as needed for moderate pain or severe pain. Patient taking differently: Take 1 tablet by  mouth in the morning, at noon, in the evening, and at bedtime. 06/05/20  Yes Swinteck, Arlys John, MD  lisinopril (ZESTRIL) 30 MG tablet Take 1 tablet (30 mg total) by mouth daily. 06/08/20  Yes Rodolph Bong, MD  oxybutynin (DITROPAN-XL) 5 MG 24 hr tablet Take 5 mg by mouth daily. 01/08/20  Yes [provider]  pregabalin (LYRICA) 150 MG capsule Take 150 mg by mouth 2 (two) times daily.   Yes [provider]  rOPINIRole (REQUIP) 2 MG tablet Take 2 mg by mouth 2 (two) times daily.    Yes [provider]  senna-docusate (SENOKOT-S) 8.6-50 MG tablet Take 1 tablet by mouth 2 (two) times daily. Patient taking differently: Take 1 tablet by mouth daily as needed for mild constipation. 06/05/20  Yes Rodolph Bong, MD  vitamin B-12 (CYANOCOBALAMIN) 1000 MCG tablet Take 1 tablet (1,000 mcg total) by mouth daily. 06/05/20  Yes Rodolph Bong, MD  Vitamin D, Ergocalciferol, (DRISDOL) 50000 units CAPS capsule Take 50,000 Units  by mouth every Sunday.  10/31/17  Yes [provider]  zolpidem (AMBIEN) 10 MG tablet Take 5 mg by mouth at bedtime. 12/10/19  Yes [provider]   Physical Exam: Vitals:   06/21/20 0245 06/21/20 0300 06/21/20 0315 06/21/20 0330  BP:  115/62 (!) 150/61 (!) 162/79  Pulse: (!) 37 (!) 34 (!) 101 (!) 49  Resp:   (!) 32 20  Temp:      TempSrc:      SpO2: 97% 94% 98% 99%  Weight:      Height:       Constitutional: Chronically ill-appearing, but in NAD. Eyes: PERRL, lids and conjunctivae normal ENMT: Mucous membranes are dry. Posterior pharynx clear of any exudate or lesions. Neck: normal, supple, no masses, no thyromegaly Respiratory: Decreased breath sounds in bases, otherwise clear to auscultation bilaterally, no wheezing, no crackles. Normal respiratory effort. No accessory muscle use.  Cardiovascular: Bradycardic with a regular rhythm in the 30s and 40s, no murmurs / rubs / gallops. No extremity edema. 2+ pedal pulses. No carotid  bruits.  Abdomen: Obese, no distention.  Bowel sounds positive.  Soft, no tenderness, no masses palpated. No hepatosplenomegaly.  Musculoskeletal: no clubbing / cyanosis. Good ROM, no contractures. Normal muscle tone.  Skin: Decreased skin turgor.  Postop surgical wounds unremarkable.  See pictures below.  Otherwise no rashes, lesions, ulcers on very limited dermatological examination. Neurologic: CN 2-12 grossly intact.  Sedated.  Unable to fully evaluate Psychiatric: Sedated.  Unable to fully evaluate.        Labs on Admission: I have personally reviewed following labs and imaging studies  CBC: Recent Labs  Lab 06/20/20 1827  WBC 13.5*  NEUTROABS 10.3*  HGB 12.2  HCT 39.0  MCV 91.5  PLT 311    Basic Metabolic Panel: Recent Labs  Lab 06/20/20 1827  NA 143  K 3.3*  CL 113*  CO2 23  GLUCOSE 103*  BUN 26*  CREATININE 0.81  CALCIUM 8.9    GFR: Estimated Creatinine Clearance: 94.6 mL/min (by C-G formula based on SCr of 0.81 mg/dL).  Liver Function Tests: Recent Labs  Lab 06/20/20 1827  AST 16  ALT 11  ALKPHOS 137*  BILITOT 1.0  PROT 6.6  ALBUMIN 3.6    Urine analysis:    Component Value Date/Time   COLORURINE AMBER (A) 06/20/2020 1931   APPEARANCEUR HAZY (A) 06/20/2020 1931   LABSPEC 1.026 06/20/2020 1931   PHURINE 5.0 06/20/2020 1931   GLUCOSEU NEGATIVE 06/20/2020 1931   HGBUR MODERATE (A) 06/20/2020 1931   BILIRUBINUR NEGATIVE 06/20/2020 1931   KETONESUR NEGATIVE 06/20/2020 1931   PROTEINUR NEGATIVE 06/20/2020 1931   UROBILINOGEN 1.0 02/28/2008 1445   NITRITE NEGATIVE 06/20/2020 1931   LEUKOCYTESUR TRACE (A) 06/20/2020 1931    Radiological Exams on Admission: CT Head Wo Contrast  Result Date: 06/21/2020 CLINICAL DATA:  Delirium EXAM: CT HEAD WITHOUT CONTRAST TECHNIQUE: Contiguous axial images were obtained from the base of the skull through the vertex without intravenous contrast. COMPARISON:  02/11/2020 FINDINGS: Brain: Normal anatomic  configuration. Parenchymal volume loss is commensurate with the patient's age. Mild periventricular white matter changes are present likely reflecting the sequela of small vessel ischemia. Remote infarcts are again identified within the a left external capsule and subinsular white matter, left centrum semiovale, and right cerebellum. No abnormal intra or extra-axial mass lesion or fluid collection. No abnormal mass effect or midline shift. No evidence of acute intracranial hemorrhage or infarct. Ventricular size is normal. Cerebellum unremarkable.  Vascular: No asymmetric hyperdense vasculature at the skull base. Skull: Intact Sinuses/Orbits: Paranasal sinuses are clear. Orbits are unremarkable. Other: Mastoid air cells and middle ear cavities are clear. IMPRESSION: No acute intracranial abnormality. Mild senescent change. Multiple remote infarcts, stable since prior examination. Electronically Signed   By: Helyn NumbersAshesh  Parikh MD   On: 06/21/2020 02:28   DG Chest Port 1 View  Result Date: 06/20/2020 CLINICAL DATA:  Fever and shortness of breath. EXAM: PORTABLE CHEST 1 VIEW COMPARISON:  Chest x-ray dated June 04, 2020. FINDINGS: The heart size and mediastinal contours are within normal limits. Normal pulmonary vascularity. Low lung volumes with mild bibasilar atelectasis. No pleural effusion or pneumothorax. No acute osseous abnormality. IMPRESSION: 1. Low lung volumes with mild bibasilar atelectasis. Electronically Signed   By: Obie DredgeWilliam T Derry M.D.   On: 06/20/2020 19:19    EKG: Independently reviewed.  Vent. rate 68 BPM PR interval * ms QRS duration 127 ms QT/QTc 442/471 ms P-R-T axes 57 -2 23 Sinus rhythm Prolonged PR interval IVCD, consider atypical RBBB  Assessment/Plan Principal problem:   Sepsis due to undetermined organism (HCC) Likely secondary to UTI. Progressive unit/observation. Continue IV fluids. Continue ceftriaxone 2 g IVPB q 24 hours. Follow blood culture and  sensitivity. Follow-up urine culture and sensitivity.  Active Problems:   Bradycardia 2ry to sedation and sleep apnea vagal stimulation. Responded to IV atropine. Continue cardiac monitoring.    HTN (hypertension) Continue lisinopril 30 mg p.o. daily. Monitor BP, renal function electrolytes.    Dyslipidemia Continue Zetia 10 mg p.o. daily.    Class 2 obesity BMI of 38.09 kg/m Lifestyle modifications.    Hypokalemia Replacing. Follow-up potassium level.    Hypomagnesemia Magnesium supplementation.    History of CVA with residual deficit Supportive care.    OSA (obstructive sleep apnea) The patient does not use CPAP. Will order while in the hospital. The patient will likely decline it.    B12 deficiency Continue cyanocobalamin oral supplement.    Gastroesophageal reflux disease Continue PPI and famotidine.     DVT prophylaxis: Lovenox SQ. Code Status:   Full code. Family Communication:   Disposition Plan:   Patient is from:  Home.  Anticipated DC to:  TBD.  Anticipated DC date:  06/22/2020 or 06/23/2020.  Anticipated DC barriers: Clinical status.  Consults called: Admission status:  Observation/progressive unit.   Severity of Illness: High due to presenting with sepsis with fever, dyspnea, hypoxia and altered mental status, which is typical of her septic picture when she gets a urine tract infection.  Bobette Moavid Manuel Ashwin Tibbs MD [facility]  How to contact the [facility] Attending or Consulting provider 7A - 7P or covering provider during after hours 7P -7A, for this patient?   1. Check the care team in [facility] and look for a) attending/consulting [facility] provider listed and b) the [facility] team listed 2. Log into [email or web address] and use [facility] universal password to access. If you do not have the password, please contact the hospital operator. 3. Locate the [facility] provider you are looking for under [facility] and page to a number that you can be directly  reached. 4. If you still have difficulty reaching the provider, please page the [facility] (Director on Call) for the Hospitalists listed on amion for assistance.  06/21/2020, 3:54 AM   This document was prepared using Dragon voice recognition software and may contain some unintended transcription errors.

## 2020-06-21 NOTE — ED Notes (Signed)
Pt is attempting to remove IVs and refusing IV Haldol. Pt is now IVC'd per MD and PA. Security paged for assistance.

## 2020-06-21 NOTE — Progress Notes (Signed)
Admitting physician addendum:  The nursing staff reported that the patient's hemoglobin decreased from 12.2 to 6.4 g/dL.  Her blood pressure has remained stable.  She has received more than 4 L of IVF so far.  Will repeat CBC and have preemptively sent for type and screen with crossmatch oh 2 units of PRBC.  Will transfuse if repeat CBC confirms measurement.  Sanda Klein, MD

## 2020-06-21 NOTE — ED Notes (Signed)
ED provider aware of pt heart rate. EKG performed. Hospitalist notified and orders will be placed for medications

## 2020-06-21 NOTE — ED Notes (Signed)
Attempt to call Dr. Robb Matar for low hemoglobin result. Awaiting call back

## 2020-06-21 NOTE — Progress Notes (Addendum)
Called to pt patient room, HR low 20's, HR 26, 27, pt lethergic and difficult to arouse. CBG 74, BP 140's systolic. Pt begin to talk and look around, MD updated on finding. Pt has history sleep apnea and states she does not wear her CPAP a consistently.SRP, RN

## 2020-06-21 NOTE — ED Notes (Signed)
Patient is alert and oriented X4.  Patient calm and cooperative this shift.  Patient's IVC has been discontinued by Dr. Pola Corn, MD called patient's husband and discussed with him as well.  Sitter is no longer at bedside.

## 2020-06-21 NOTE — Progress Notes (Signed)
PT Cancellation Note  Patient Details Name: Heidi Green MRN: [id number] DOB: [birth year]   Cancelled Treatment:    Reason Eval/Treat Not Completed: Other (comment) (pt just arrived to floor, RN is performing assessment and dressing changes, will follow.)   Tamala Ser PT 06/21/2020  Acute Rehabilitation Services Pager [phone] Office [phone]

## 2020-06-21 NOTE — ED Notes (Signed)
Pt has removed all monitoring equipment and refused for this RN to reapply it. Pt is ignoring my questions and refuses to talk.

## 2020-06-21 NOTE — ED Notes (Signed)
Pt is awake and drinking water

## 2020-06-21 NOTE — ED Notes (Signed)
Patient transported to CT 

## 2020-06-21 NOTE — Progress Notes (Signed)
Handoff report called to Lurena Joiner, RN, Pt transported to [employer]. Husband at bedside with pt and MD called and spoke with spouse and updated him on her condition.  SRP, RN

## 2020-06-21 NOTE — ED Notes (Signed)
Patient Refuse Vitals but the RN has been notify

## 2020-06-21 NOTE — Progress Notes (Signed)
PROGRESS NOTE  Heidi Green  DOB: [birth year]  PCP: Chiquita Loth, [place] GYB:[id number]  DOA: 06/20/2020  LOS: 0 days   Chief Complaint  Patient presents with  . Shortness of Breath   Brief narrative: KEREN ALVERIO is a 65 y.o. female with PMH significant for obesity, sleep apnea not on CPAP, HTN, HLD, osteoarthritis, chronic lower back pain, history of chest pain with negative cath and normal LV function, headaches, history of intracranial hemorrhage, urolithiasis, GERD, B12 deficiency who was recently hospitalized (2/28-2/21) last month for right tibia/fibula fracture and underwent intramedullary fixation.  Patient was brought to the ED on 3/8 for shortness of breath, altered mental status, fever. In the ED, she had a temperature of 101.7 required 3 L oxygen by nasal cannula.  She has restless, agitated and received Haldol.  She then became bradycardic and required atropine with appropriate response. Labs showed potassium of 3.3 Urinalysis with hazy amber color urine trace leukocytes, many bacteria and crystals CT head showed multiple remote infarcts, no acute intracranial abnormality. Patient was admitted to hospitalist service for sepsis secondary to UTI  Subjective: Patient was seen and examined this morning. Elderly Caucasian female.  Lying down in bed.  Not in distress.  She was upset that she was placed in IVC status.  Denies any suicidal ideation.  Mental status seems completely okay at the time of my evaluation.  I discussed with her husband on the phone.  He states he was concerned about her mental status last night which seem to be better now.  But he doesn't want an IVC. Chart reviewed.  Assessment/Plan: Sepsis secondary to UTI - POA -Presented with fever, tachypnea, abnormal urinalysis as above -Given IV Rocephin in the ED. Continue the same. -Urine culture and blood culture report pending. -Continue antibiotics and IV fluid.  Acute metabolic encephalopathy -Patient had  altered mental status at presentation.  Probably secondary to UTI.  Also contributed by the use of narcotic pain medicines. -Mental status is gradually improving. No suicidal ideation.  Can discontinue IVC -Minimize the use of pain medicines.  Recent right tibia/fibula fracture  -Underwent intramedullary fixation by Dr. Linna Caprice.  Patient apparently was supposed to follow-up at his office on 3/8 but was lethargic and ended up coming to the ED. -Discussed with Dr. Linna Caprice.  He will see her in the hospital tomorrow. -Patient has been refusing any DVT prophylaxis.  Right leg duplex ultrasound negative for DVT.  Hypokalemia/hypomagnesemia -Oral and IV replacement ordered. -Repeat tomorrow Recent Labs  Lab 06/20/20 1827 06/21/20 0452  K 3.3* 4.0  MG  --  1.6*  PHOS  --  2.6   Nocturnal bradycardia - probably due to OSA itself.  Last night, patient was bradycardic down to 40s after receiving Haldol and was given a dose of atropine.    Essential hypertension -Continue lisinopril 30 mg p.o. daily. -Monitor BP, renal function electrolytes.  Dyslipidemia -Continue Zetia 10 mg p.o. daily.  Morbid obesity -Body mass index is 38.09 kg/m. Patient has been advised to make an attempt to improve diet and exercise patterns to aid in weight loss.  OSA -Does not use CPAP.  History of CVA with residual right-sided deficit -Stroke 5 years ago with residual right-sided deficit, facial deviation and dysarthria  B12 deficiency -Continue cyanocobalamin oral supplement.  Gastroesophageal reflux disease -Continue PPI and famotidine.  Mobility: Limited mobility since a stroke 5 years ago and most recently right leg surgery 2 weeks ago. Code Status:   Code Status: Full Code  Nutritional status: Body mass index is 38.09 kg/m.     Diet Order            Diet heart healthy/carb modified Room service appropriate? Yes; Fluid consistency: Thin  Diet effective now                DVT  prophylaxis: Place and maintain sequential compression device Start: 06/21/20 0531   Antimicrobials:  IV Rocephin Fluid: Normal saline at 50 mill per hour Consultants: Orthopedics Family Communication:  Spoke with patient's son on the phone  Status is: Observation   Dispo: The patient is from: Home              Anticipated d/c is to: Home versus SNF              Patient currently is not medically stable to d/c.   Difficult to place patient No       Infusions:  . 0.9 % NaCl with KCl 40 mEq / L    . cefTRIAXone (ROCEPHIN)  IV      Scheduled Meds: . diclofenac Sodium  2 g Topical QID  . ezetimibe  10 mg Oral Q supper  . famotidine  20 mg Oral QHS  . [START ON 06/22/2020] ferrous sulfate  325 mg Oral Q breakfast  . folic acid  1 mg Oral Daily  . lisinopril  30 mg Oral Daily  . oxybutynin  5 mg Oral Daily  . pantoprazole  40 mg Oral Daily  . pregabalin  150 mg Oral BID  . rOPINIRole  2 mg Oral BID  . vitamin B-12  1,000 mcg Oral Daily  . [START ON 06/25/2020] Vitamin D (Ergocalciferol)  50,000 Units Oral Q Sun  . zolpidem  5 mg Oral QHS    Antimicrobials: Anti-infectives (From admission, onward)   Start     Dose/Rate Route Frequency Ordered Stop   06/21/20 1900  cefTRIAXone (ROCEPHIN) 2 g in sodium chloride 0.9 % 100 mL IVPB        2 g 200 mL/hr over 30 Minutes Intravenous Every 24 hours 06/21/20 0344     06/20/20 1900  vancomycin (VANCOREADY) IVPB 2000 mg/400 mL        2,000 mg 200 mL/hr over 120 Minutes Intravenous STAT 06/20/20 1846 06/20/20 2200   06/20/20 1900  cefTRIAXone (ROCEPHIN) 2 g in sodium chloride 0.9 % 100 mL IVPB        2 g 200 mL/hr over 30 Minutes Intravenous STAT 06/20/20 1848 06/20/20 1956   06/20/20 1845  vancomycin (VANCOCIN) IVPB 1000 mg/200 mL premix  Status:  Discontinued        1,000 mg 200 mL/hr over 60 Minutes Intravenous  Once 06/20/20 1843 06/20/20 1846   06/20/20 1845  levofloxacin (LEVAQUIN) IVPB 750 mg  Status:  Discontinued         750 mg 100 mL/hr over 90 Minutes Intravenous  Once 06/20/20 1843 06/20/20 1848      PRN meds: acetaminophen **OR** acetaminophen, albuterol, atropine, HYDROcodone-acetaminophen, prochlorperazine, senna-docusate   Objective: Vitals:   06/21/20 1308 06/21/20 1330  BP:  137/71  Pulse: (!) 57 (!) 44  Resp:  19  Temp:    SpO2:  96%    Intake/Output Summary (Last 24 hours) at 06/21/2020 1402 Last data filed at 06/21/2020 0942 Gross per 24 hour  Intake 3567.5 ml  Output --  Net 3567.5 ml   Filed Weights   06/20/20 1814  Weight: 117 kg   Weight change:  Body mass index is 38.09 kg/m.   Physical Exam: General exam: Pleasant elderly Caucasian female.  Not in distress Skin: No rashes, lesions or ulcers. HEENT: Atraumatic, normocephalic, no obvious bleeding Lungs: Clear to auscultation bilaterally CVS: Regular rate and rhythm, no murmur GI/Abd soft, nontender, nondistended, bowel sounds CNS: Alert, awake abound x3 Psychiatry: Mood appropriate Extremities: No pedal edema, no calf tenderness, right leg with areas of sutures in place  Data Review: I have personally reviewed the laboratory data and studies available.  Recent Labs  Lab 06/20/20 1827 06/21/20 0452 06/21/20 0550  WBC 13.5* QUESTIONABLE RESULTS, RECOMMEND RECOLLECT TO VERIFY 10.7*  NEUTROABS 10.3*  --   --   HGB 12.2 QUESTIONABLE RESULTS, RECOMMEND RECOLLECT TO VERIFY 11.1*  HCT 39.0 QUESTIONABLE RESULTS, RECOMMEND RECOLLECT TO VERIFY 36.0  MCV 91.5 QUESTIONABLE RESULTS, RECOMMEND RECOLLECT TO VERIFY 92.5  PLT 311 QUESTIONABLE RESULTS, RECOMMEND RECOLLECT TO VERIFY 231   Recent Labs  Lab 06/20/20 1827 06/21/20 0452  NA 143 143  K 3.3* 4.0  CL 113* 121*  CO2 23 17*  GLUCOSE 103* 76  BUN 26* 18  CREATININE 0.81 0.43*  CALCIUM 8.9 6.5*  MG  --  1.6*  PHOS  --  2.6    F/u labs ordered Unresulted Labs (From admission, onward)          Start     Ordered   06/22/20 0500  CBC with Differential  Tomorrow  morning,   R        06/21/20 0344   06/22/20 0500  Comprehensive metabolic panel  Tomorrow morning,   R        06/21/20 0344   06/21/20 0530  Occult blood card to lab, stool  Once,   STAT        06/21/20 0529   06/20/20 1841  Urine culture  (Septic presentation on arrival (screening labs, nursing and treatment orders for obvious sepsis))  ONCE - STAT,   STAT        06/20/20 1843          Signed, Lorin Glass, MD [facility] 06/21/2020

## 2020-06-21 NOTE — Progress Notes (Signed)
Pt arrived to unit, alert and talking, pt spouse at bedside. Pt place on monitor. HR noted in the 40's SB. PA from [facility] arrived at bedside, removed staples to right knee covered with Allevyn. and sutures to lower leg open to air. Skin assessment complete and barrier cream applied to perineum area and sacrum. SRP, RN

## 2020-06-21 NOTE — Progress Notes (Signed)
Patient has remained persistently bradycardic down to 40s and 50s today.  Asymptomatic.  However as she falls asleep, her heart rate dips down to 30s and occasionally to 20s.  She wakes up on simple command after which heart rate picks up again. She was given atropine earlier this morning while in the ED for the same reason. This afternoon, she continues to remain bradycardic. Repeat EKG shows sinus bradycardia. She is not on any AV nodal blocking agent at home. Minimize use of mood altering medications, pain medication. Obtain echocardiogram. It is unclear at this time if this is an incidental finding or at all related to patient's presentation. We will transfer the patient to stepdown, continue to monitor in telemetry.   If no improvement, will call cardiology consultation tomorrow.

## 2020-06-21 NOTE — ED Notes (Signed)
Dr. Robb Matar notified of 6.4 hemoglobin

## 2020-06-21 NOTE — ED Notes (Signed)
Pt has taken off her BP cuff and other monitoring devices. Pt refuses to let anyone put them back on

## 2020-06-21 NOTE — CV Procedure (Signed)
RLE venous duplex completed. Duwayne Heck, RN given preliminary results.  Results can be found under chart review under CV PROC. 06/21/2020 11:13 AM Lygia Olaes RVT, RDMS

## 2020-06-21 NOTE — ED Notes (Signed)
Pt is awake. A&Ox4. Able to answer all orientation questions. Pt is being cooperative and allowed me to place BP cuff and monitoring devices back on.

## 2020-06-21 NOTE — ED Notes (Signed)
Heart rate is improving with medications

## 2020-06-21 NOTE — ED Notes (Signed)
Pt offered breakfast tray. Pt declined breakfast tray. Sitter at bedside.

## 2020-06-22 ENCOUNTER — Encounter: Payer: Self-pay | Admitting: Radiology

## 2020-06-22 ENCOUNTER — Inpatient Hospital Stay (HOSPITAL_COMMUNITY): Payer: Medicare HMO

## 2020-06-22 ENCOUNTER — Inpatient Hospital Stay: Payer: Medicare HMO

## 2020-06-22 ENCOUNTER — Other Ambulatory Visit: Payer: Self-pay | Admitting: Medical

## 2020-06-22 DIAGNOSIS — G4733 Obstructive sleep apnea (adult) (pediatric): Secondary | ICD-10-CM

## 2020-06-22 DIAGNOSIS — Z6838 Body mass index (BMI) 38.0-38.9, adult: Secondary | ICD-10-CM | POA: Diagnosis not present

## 2020-06-22 DIAGNOSIS — I69351 Hemiplegia and hemiparesis following cerebral infarction affecting right dominant side: Secondary | ICD-10-CM | POA: Diagnosis not present

## 2020-06-22 DIAGNOSIS — G9341 Metabolic encephalopathy: Secondary | ICD-10-CM | POA: Diagnosis present

## 2020-06-22 DIAGNOSIS — I10 Essential (primary) hypertension: Secondary | ICD-10-CM | POA: Diagnosis present

## 2020-06-22 DIAGNOSIS — A4151 Sepsis due to Escherichia coli [E. coli]: Secondary | ICD-10-CM | POA: Diagnosis present

## 2020-06-22 DIAGNOSIS — I454 Nonspecific intraventricular block: Secondary | ICD-10-CM | POA: Diagnosis present

## 2020-06-22 DIAGNOSIS — R9431 Abnormal electrocardiogram [ECG] [EKG]: Secondary | ICD-10-CM

## 2020-06-22 DIAGNOSIS — E876 Hypokalemia: Secondary | ICD-10-CM | POA: Diagnosis present

## 2020-06-22 DIAGNOSIS — S82401D Unspecified fracture of shaft of right fibula, subsequent encounter for closed fracture with routine healing: Secondary | ICD-10-CM | POA: Diagnosis not present

## 2020-06-22 DIAGNOSIS — I69392 Facial weakness following cerebral infarction: Secondary | ICD-10-CM | POA: Diagnosis not present

## 2020-06-22 DIAGNOSIS — I69322 Dysarthria following cerebral infarction: Secondary | ICD-10-CM | POA: Diagnosis not present

## 2020-06-22 DIAGNOSIS — I440 Atrioventricular block, first degree: Secondary | ICD-10-CM | POA: Diagnosis present

## 2020-06-22 DIAGNOSIS — R0902 Hypoxemia: Secondary | ICD-10-CM | POA: Diagnosis present

## 2020-06-22 DIAGNOSIS — E785 Hyperlipidemia, unspecified: Secondary | ICD-10-CM

## 2020-06-22 DIAGNOSIS — S82201D Unspecified fracture of shaft of right tibia, subsequent encounter for closed fracture with routine healing: Secondary | ICD-10-CM | POA: Diagnosis not present

## 2020-06-22 DIAGNOSIS — Z79899 Other long term (current) drug therapy: Secondary | ICD-10-CM | POA: Diagnosis not present

## 2020-06-22 DIAGNOSIS — Z20822 Contact with and (suspected) exposure to covid-19: Secondary | ICD-10-CM | POA: Diagnosis present

## 2020-06-22 DIAGNOSIS — W19XXXD Unspecified fall, subsequent encounter: Secondary | ICD-10-CM | POA: Diagnosis present

## 2020-06-22 DIAGNOSIS — R001 Bradycardia, unspecified: Secondary | ICD-10-CM | POA: Diagnosis not present

## 2020-06-22 DIAGNOSIS — K219 Gastro-esophageal reflux disease without esophagitis: Secondary | ICD-10-CM | POA: Diagnosis present

## 2020-06-22 DIAGNOSIS — E538 Deficiency of other specified B group vitamins: Secondary | ICD-10-CM | POA: Diagnosis present

## 2020-06-22 DIAGNOSIS — N390 Urinary tract infection, site not specified: Secondary | ICD-10-CM | POA: Diagnosis present

## 2020-06-22 DIAGNOSIS — R451 Restlessness and agitation: Secondary | ICD-10-CM | POA: Diagnosis present

## 2020-06-22 DIAGNOSIS — A419 Sepsis, unspecified organism: Secondary | ICD-10-CM | POA: Diagnosis present

## 2020-06-22 LAB — COMPREHENSIVE METABOLIC PANEL
ALT: 12 U/L (ref 0–44)
AST: 16 U/L (ref 15–41)
Albumin: 3.4 g/dL — ABNORMAL LOW (ref 3.5–5.0)
Alkaline Phosphatase: 129 U/L — ABNORMAL HIGH (ref 38–126)
Anion gap: 10 (ref 5–15)
BUN: 13 mg/dL (ref 8–23)
CO2: 20 mmol/L — ABNORMAL LOW (ref 22–32)
Calcium: 8.7 mg/dL — ABNORMAL LOW (ref 8.9–10.3)
Chloride: 112 mmol/L — ABNORMAL HIGH (ref 98–111)
Creatinine, Ser: 0.58 mg/dL (ref 0.44–1.00)
GFR, Estimated: >60 mL/min (ref >60)
Glucose, Bld: 95 mg/dL (ref 70–99)
Potassium: 3.7 mmol/L (ref 3.5–5.1)
Sodium: 142 mmol/L (ref 135–145)
Total Bilirubin: 0.9 mg/dL (ref 0.3–1.2)
Total Protein: 6.4 g/dL — ABNORMAL LOW (ref 6.5–8.1)

## 2020-06-22 LAB — CBC WITH DIFFERENTIAL/PLATELET
Abs Immature Granulocytes: 0.02 10*3/uL (ref 0.00–0.07)
Basophils Absolute: 0.1 10*3/uL (ref 0.0–0.1)
Basophils Relative: 1 %
Eosinophils Absolute: 0.5 10*3/uL (ref 0.0–0.5)
Eosinophils Relative: 7 %
HCT: 38.9 % (ref 36.0–46.0)
Hemoglobin: 12 g/dL (ref 12.0–15.0)
Immature Granulocytes: 0 %
Lymphocytes Relative: 26 %
Lymphs Abs: 2.1 10*3/uL (ref 0.7–4.0)
MCH: 28.7 pg (ref 26.0–34.0)
MCHC: 30.8 g/dL (ref 30.0–36.0)
MCV: 93.1 fL (ref 80.0–100.0)
Monocytes Absolute: 0.4 10*3/uL (ref 0.1–1.0)
Monocytes Relative: 5 %
Neutro Abs: 4.9 10*3/uL (ref 1.7–7.7)
Neutrophils Relative %: 61 %
Platelets: 244 10*3/uL (ref 150–400)
RBC: 4.18 MIL/uL (ref 3.87–5.11)
RDW: 14.7 % (ref 11.5–15.5)
WBC: 8 10*3/uL (ref 4.0–10.5)
nRBC: 0 % (ref 0.0–0.2)

## 2020-06-22 LAB — ECHOCARDIOGRAM COMPLETE
Area-P 1/2: 3.08 cm2
Height: 69 in
S' Lateral: 3.8 cm
Weight: 3908.31 oz

## 2020-06-22 MED ORDER — ROPINIROLE HCL 1 MG PO TABS
2.0000 mg | ORAL_TABLET | Freq: Once | ORAL | Status: AC
  Administered 2020-06-22: 2 mg via ORAL
  Filled 2020-06-22 (×2): qty 2

## 2020-06-22 MED ORDER — ATROPINE SULFATE 1 MG/10ML IJ SOSY
0.5000 mg | PREFILLED_SYRINGE | INTRAMUSCULAR | Status: DC | PRN
  Administered 2020-06-22: 0.5 mg via INTRAVENOUS
  Filled 2020-06-22: qty 10

## 2020-06-22 NOTE — Progress Notes (Signed)
Confirmed with [facility] MD and Cardiology that atropine should not be given if patient is not symptomatic

## 2020-06-22 NOTE — Progress Notes (Signed)
Despite cardiology note, Pt sustains HR as low as 27bpm and as high as 58bpm WHILE AWAKE, not only during sleep.  Per cardiology, RN is suppose to hold off on atropine until pt is symptomatic however we are worried about perfusion.  [facility] on call has been contacted.

## 2020-06-22 NOTE — Progress Notes (Signed)
Inpatient Rehab Admissions Coordinator:   Consult received.  Note PT recommending home health for f/u and we would not be able to get authorization from [facility] for CIR with home health recommendations.  Note pt was unable to transfer past EOB during evaluation today, so will follow for one more therapy session to see if recommendations remain for home health.  Also recommend OT evaluation.    Estill Dooms, PT, DPT Admissions Coordinator [phone] 06/22/20  4:23 PM

## 2020-06-22 NOTE — Evaluation (Addendum)
Physical Therapy Evaluation Patient Details Name: Heidi Green MRN: [id number] DOB: [birth year] Today's Date: 06/22/2020   History of Present Illness  Heidi Green is a 65 y.o. female with PMH significant for obesity, sleep apnea , HTN, HLD, osteoarthritis, chronic lower back pain, history of chest pain , headaches, history of intracranial hemorrhage with right hemiparesis, urolithiasis, GERD, B12 deficiency who was recently hospitalized (2/28-2/21) last month for right tibia/fibula fracture and underwent intramedullary fixation and was DC'd home.Patient was brought to the ED on 3/8 for shortness of breath, altered mental status, fever.  In the ED, she had a temperature of 101.7 required 3 L oxygen by nasal cannula.   She has restless, agitated and received Haldol.  She then became bradycardic and required atropine with appropriate response.CT head showed multiple remote infarcts, no acute intracranial abnormality  Clinical Impression  Patient anxious and wanting to transfer to recliner. Patient 's HR 50-66 while mobilizing, 95% on RA during mobility. Patient sat on bed edge x 10 minutes. Patient is  Inconsistent with information  Re: How she transfers at home since right Tib/fib fracture. She reports using a sliding board and also states  That  she transfers to BSC(Not a drop arm) independently. Family not present.Made attempt to transfer to recliner to The left but unable to safely attempt. Will assess transfers with sliding board next visit into appropriate chair.  Pt admitted with above diagnosis.  Pt currently with functional limitations due to the deficits listed below (see PT Problem List). Pt will benefit from skilled PT to increase their independence and safety with mobility to allow discharge to the venue listed below.       Follow Up Recommendations Home health PT    Equipment Recommendations  None recommended by PT    Recommendations for Other Services   OT    Precautions / Restrictions  Precautions Precautions: Fall Precaution Comments: monitor HR, bradycardic Restrictions RLE Weight Bearing: Non weight bearing      Mobility  Bed Mobility Overal bed mobility: Needs Assistance Bed Mobility: Supine to Sit;Sit to Supine     Supine to sit: Mod assist;HOB elevated Sit to supine: Max assist   General bed mobility comments: patient required assistance with right leg to move to bed edge, Patient pushed self to upright using left UE.. +2 max assistance for legs back onto bed.    Transfers                 General transfer comment: did not perform, made an attempt to trnasfer withoutt drop arm recliner and deemed too risky.Will require sliding board and a drop arm chair . Patient increased with anxiousness while sitting  Ambulation/Gait                Stairs            Wheelchair Mobility    Modified Rankin (Stroke Patients Only)       Balance Overall balance assessment: History of Falls;Needs assistance Sitting-balance support: Feet supported;No upper extremity supported Sitting balance-Leahy Scale: Fair                                       Pertinent Vitals/Pain Pain Assessment: Faces Faces Pain Scale: Hurts little more Pain Location: R LE Pain Descriptors / Indicators: Grimacing;Sore Pain Intervention(s): Monitored during session;Limited activity within patient's tolerance;Repositioned    Home Living Family/patient expects to be discharged  to:: Private residence Living Arrangements: Spouse/significant other Available Help at Discharge: Family;Available 24 hours/day Type of Home: House Home Access: Ramped entrance     Home Layout: Two level;Bed/bath upstairs Home Equipment: Walker - 2 wheels;Hospital bed;Bedside commode;Tub bench;Other (comment);Electric scooter Additional Comments: has sliding board, supposed to be getting a drop arm BSC    Prior Function Level of Independence: Needs assistance   Gait /  Transfers Assistance Needed: nonambulatory. per pt, she tranasfers to [facility] without assistance but yet uses sliding board. ? realiability .o/from WC with Mod Ind  ADL's / Homemaking Assistance Needed: uses BSC on first level w/ no BR.        Hand Dominance        Extremity/Trunk Assessment   Upper Extremity Assessment Upper Extremity Assessment: Defer to OT evaluation    Lower Extremity Assessment Lower Extremity Assessment: RLE deficits/detail RLE Deficits / Details: decreased knee extension with pain to  stretch, grossly moves when moving to bed edge LLE Deficits / Details: grossly [facility]       Communication      Cognition Arousal/Alertness: Awake/alert Behavior During Therapy: WFL for tasks assessed/performed;Impulsive Overall Cognitive Status: No family/caregiver present to determine baseline cognitive functioning Area of Impairment: Orientation;Following commands;Safety/judgement;Awareness                 Orientation Level: Time       Safety/Judgement: Decreased awareness of deficits;Decreased awareness of safety     General Comments: poor insight to deficits; patient with word finding difficulty, speach mumbling at times.Patient  not clear on trnasfers being done  since fx.      General Comments      Exercises     Assessment/Plan    PT Assessment Patient needs continued PT services  PT Problem List Decreased strength;Decreased range of motion;Decreased activity tolerance;Decreased mobility;Pain       PT Treatment Interventions Therapeutic activities;Gait training;Functional mobility training;Patient/family education;DME instruction;Therapeutic exercise    PT Goals (Current goals can be found in the Care Plan section)  Acute Rehab PT Goals Patient Stated Goal: I want to get up PT Goal Formulation: With patient Time For Goal Achievement: 07/06/20 Potential to Achieve Goals: Good    Frequency Min 3X/week   Barriers to discharge         Co-evaluation               AM-PAC PT "6 Clicks" Mobility  Outcome Measure Help needed turning from your back to your side while in a flat bed without using bedrails?: A Lot Help needed moving from lying on your back to sitting on the side of a flat bed without using bedrails?: A Lot Help needed moving to and from a bed to a chair (including a wheelchair)?: Total Help needed standing up from a chair using your arms (e.g., wheelchair or bedside chair)?: Total Help needed to walk in hospital room?: Total Help needed climbing 3-5 steps with a railing? : Total 6 Click Score: 8    End of Session   Activity Tolerance: Patient tolerated treatment well Patient left: in bed;with nursing/sitter in room Nurse Communication: Mobility status PT Visit Diagnosis: Other abnormalities of gait and mobility (R26.89);Pain Pain - Right/Left: Right Pain - part of body: Leg    Time: 1112-1150 PT Time Calculation (min) (ACUTE ONLY): 38 min   Charges:   PT Evaluation $PT Eval Moderate Complexity: 1 Mod PT Treatments $Therapeutic Activity: 23-37 mins        Blanchard Kelch PT Acute Rehabilitation Services  Pager [phone] Office [phone]   Rada Hay 06/22/2020, 1:32 PM

## 2020-06-22 NOTE — Progress Notes (Signed)
  Echocardiogram 2D Echocardiogram has been performed.  Heidi Green 06/22/2020, 2:05 PM

## 2020-06-22 NOTE — Consult Note (Signed)
Cardiology Consultation:   Patient ID: Heidi Green MRN: [id number]; DOB: 26-Apr-1955  Admit date: 06/20/2020 Date of Consult: 06/22/2020  PCP:  Chiquita Loth, PA   [facility]  Cardiologist:  Remotely Dr. Swaziland; new to Dr. Duke Salvia Advanced Practice Provider:  No care team member to display Electrophysiologist:  None   Patient Profile:   Heidi Green is a 65 y.o. female with a PMH of HTN, HLD, CVA, OSA not on CPAP, OA, and obesity, who is being seen today for the evaluation of bradycardia at the request of Dr. Pola Corn.  History of Present Illness:   Heidi Green was recently admitted to the hospital from 06/02/20-06/05/20 after a fall resulting in a right tibia/fibula fracture, for which she underwent surgical repair. She was discharge home after an uneventful post-op course.   She was brought back to the ED 06/20/20 with complaints of SOB, AMS, and fever. Per husband, this is her typical presentation for a UTI. She was febrile to 101.69F in the ED, otherwise vitals were initially stable. She was given IV haldol for agitation. Shortly thereafter her HR dropped to the 30s. She was given atropine with improvement. She has continue to have bradycardia with HR in the 30s at times (mostly when sleeping), though predominately in the 40s-50s. She received 2 additionally doses of atropine for HR in the 30s. She is not on AV nodal blocking agents at home. She is currently on IV antibiotics for gram negative rods UTI.   She was last evaluated by cardiology at an outpatient visit with Dr. Swaziland in 2014 following a recent admission for chest pain. She underwent a LHC in 2014 which showed 20% mLAD stenosis, 10% dRCA stenosis, otherwise normal coronary arteries.  Prior to this admission her last echocardiogram in 2018 showed EF 55-60%, indeterminate LV diastolic function, and mild concentric LVH.   At the time of this evaluation her primary complaint is not receiving her Requip for her  restless legs. She reports she has been on that medication for years without issues and she has been unable to sleep due to leg discomfort. She wants to go home. She has been generally unaware of her low heart rates with the exception of being woken up several times while sleeping due to low heart rates. She denies any feelings of dizziness, lightheadedness, pre-syncope, or syncope. She has no complaints of chest pain or SOB. She has some LE edema following her surgery though this is improving. She has chronic wounds to LLE, one of which has been there for years.    Past Medical History:  Diagnosis Date  . Arthritis    in back  . Back pain   . Chest pain 06/16/2012   negative cath with normal LV function  . Headache(784.0)   . Hypertension   . 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 Kidney stones   . Sleep apnea   . Stroke [facility])     Past Surgical History:  Procedure Laterality Date  . ABDOMINAL HYSTERECTOMY    . BACK SURGERY     x 3  . Left ACL repair  2004  . LEFT HEART CATHETERIZATION WITH CORONARY ANGIOGRAM N/A 06/16/2012   Procedure: LEFT HEART CATHETERIZATION WITH CORONARY ANGIOGRAM;  Surgeon: Peter M Swaziland, MD;  Location: [facility] CATH LAB;  Service: Cardiovascular;  Laterality: N/A;  . lipoma excision from neck  2006  . LITHOTRIPSY    . ORIF TIBIA FRACTURE Right  06/03/2020   Procedure: OPEN REDUCTION INTERNAL FIXATION (ORIF) TIBIA FRACTURE;  Surgeon: Samson Frederic, MD;  Location: [facility] ORS;  Service: Orthopedics;  Laterality: Right;  . TUBAL LIGATION       Home Medications:  Prior to Admission medications   Medication Sig Start Date End Date Taking? Authorizing Provider  albuterol (VENTOLIN HFA) 108 (90 Base) MCG/ACT inhaler Inhale 2 puffs into the lungs every 6 (six) hours as needed for wheezing or shortness of breath. 12/22/19  Yes Rhetta Mura, MD  aspirin (ASPIRIN CHILDRENS) 81 MG chewable tablet Chew 1 tablet (81 mg total) by mouth  2 (two) times daily with a meal. Patient taking differently: Chew 81 mg by mouth daily. 06/05/20 07/20/20 Yes Swinteck, Arlys John, MD  diclofenac Sodium (VOLTAREN) 1 % GEL Apply 2 g topically 4 (four) times daily. 05/24/20  Yes [provider]  esomeprazole (NEXIUM) 40 MG capsule Take 40 mg by mouth 2 (two) times daily before a meal. 08/18/18  Yes [provider]  ezetimibe (ZETIA) 10 MG tablet Take 10 mg by mouth daily with supper. 11/11/19  Yes [provider]  famotidine (PEPCID) 10 MG tablet Take 10 mg by mouth at bedtime.   Yes [provider]  FEROSUL 325 (65 Fe) MG tablet Take 325 mg by mouth daily with breakfast. 10/15/19  Yes [provider]  folic acid (FOLVITE) 1 MG tablet Take 1 tablet (1 mg total) by mouth daily. 06/06/20  Yes Rodolph Bong, MD  HYDROcodone-acetaminophen [facility]) 10-325 MG tablet Take 1 tablet by mouth every 4 (four) hours as needed for moderate pain or severe pain. Patient taking differently: Take 1 tablet by mouth in the morning, at noon, in the evening, and at bedtime. 06/05/20  Yes Swinteck, Arlys John, MD  lisinopril (ZESTRIL) 30 MG tablet Take 1 tablet (30 mg total) by mouth daily. 06/08/20  Yes Rodolph Bong, MD  oxybutynin (DITROPAN-XL) 5 MG 24 hr tablet Take 5 mg by mouth daily. 01/08/20  Yes [provider]  pregabalin (LYRICA) 150 MG capsule Take 150 mg by mouth 2 (two) times daily.   Yes [provider]  rOPINIRole (REQUIP) 2 MG tablet Take 2 mg by mouth 2 (two) times daily.    Yes [provider]  senna-docusate (SENOKOT-S) 8.6-50 MG tablet Take 1 tablet by mouth 2 (two) times daily. Patient taking differently: Take 1 tablet by mouth daily as needed for mild constipation. 06/05/20  Yes Rodolph Bong, MD  vitamin B-12 (CYANOCOBALAMIN) 1000 MCG tablet Take 1 tablet (1,000 mcg total) by mouth daily. 06/05/20  Yes Rodolph Bong, MD  Vitamin D, Ergocalciferol, (DRISDOL) 50000 units CAPS capsule  Take 50,000 Units by mouth every Sunday.  10/31/17  Yes [provider]  zolpidem (AMBIEN) 10 MG tablet Take 5 mg by mouth at bedtime. 12/10/19  Yes [provider]    Inpatient Medications: Scheduled Meds: . Chlorhexidine Gluconate Cloth  6 each Topical Daily  . diclofenac Sodium  2 g Topical QID  . ezetimibe  10 mg Oral Q supper  . famotidine  20 mg Oral QHS  . ferrous sulfate  325 mg Oral Q breakfast  . folic acid  1 mg Oral Daily  . lisinopril  30 mg Oral Daily  . oxybutynin  5 mg Oral Daily  . pantoprazole  40 mg Oral Daily  . vitamin B-12  1,000 mcg Oral Daily  . [START ON 06/25/2020] Vitamin D (Ergocalciferol)  50,000 Units Oral Q Sun  . zolpidem  5 mg Oral QHS   Continuous Infusions: . 0.9 % NaCl with KCl 40 mEq / L 50 mL/hr at 06/22/20 1000  . cefTRIAXone (ROCEPHIN)  IV Stopped (06/21/20 2129)   PRN Meds: acetaminophen **OR** acetaminophen, albuterol, atropine, HYDROcodone-acetaminophen, prochlorperazine, senna-docusate  Allergies:    Allergies  Allergen Reactions  . Buprenorphine Itching  . Garamycin [Gentamicin Sulfate] Hives and Shortness Of Breath  . Other Anaphylaxis    Reaction to HORSE SERUM  . Penicillins Anaphylaxis, Itching and Swelling    Swelling and shortness of breath after taking penicillin for a tooth infection when patient was 24. Per her account, she believes that she has taken amoxicillin and cephalosporins since that time and had no issues.   . Atorvastatin Nausea And Vomiting  . Norvasc [Amlodipine Besylate] Nausea And Vomiting  . Rosuvastatin Diarrhea and Other (See Comments)    Spasms in hand     Social History:   Social History   Socioeconomic History  . Marital status: Married    Spouse name: Brett CanalesSteve  . Number of children: 2  . Years of education: 1514  . Highest education level: Not on file  Occupational History    Comment: retired LPN  Tobacco Use  . Smoking status: Former Smoker    Packs/day: 0.50    Years: 26.00     Pack years: 13.00    Types: Cigarettes    Quit date: 06/17/2012    Years since quitting: 8.0  . Smokeless tobacco: Never Used  Substance and Sexual Activity  . Alcohol use: No  . Drug use: No  . Sexual activity: Not on file  Other Topics Concern  . Not on file  Social History Narrative   Lives at home with husband   Social Determinants of Health   Financial Resource Strain: Not on file  Food Insecurity: Not on file  Transportation Needs: Not on file  Physical Activity: Not on file  Stress: Not on file  Social Connections: Not on file  Intimate Partner Violence: Not on file    Family History:    Family History  Problem Relation Age of Onset  . Emphysema Mother   . Diabetes Mother      ROS:  Please see the history of present illness.   All other ROS reviewed and negative.     Physical Exam/Data:   Vitals:   06/22/20 0900 06/22/20 1000 06/22/20 1100 06/22/20 1214  BP: (!) 177/81 (!) 161/70 (!) 157/76 (!) 145/75  Pulse: (!) 53 (!) 47 (!) 49 (!) 50  Resp: 17 15 18 13   Temp:      TempSrc:      SpO2: 100% 100% 98% 98%  Weight:      Height:        Intake/Output Summary (Last 24 hours) at 06/22/2020 1227 Last data filed at 06/22/2020 1000 Gross per 24 hour  Intake 1702.59 ml  Output 200 ml  Net 1502.59 ml   Last 3 Weights 06/21/2020 06/20/2020 06/02/2020  Weight (lbs) 244 lb 4.3 oz 257 lb 15 oz 260 lb  Weight (kg) 110.8 kg 117 kg 117.935 kg     Body mass index is 36.07 kg/m.  General:  Well nourished, well developed, in no acute distress HEENT: sclera anicteric Neck: no JVD Vascular: No carotid bruits; distal pulses 2+ bilaterally  Cardiac:  normal S1, S2; RRR; no murmurs, rubs, or gallops Lungs:  clear to auscultation bilaterally, no wheezing, rhonchi or rales  Abd: soft, obese, nontender, no hepatomegaly  Ext:  trace LE edema Musculoskeletal:  No deformities, BUE and BLE strength normal and equal Skin: warm and dry, dressings in place to LLE and right knee  - C/D/I Neuro:  CNs 2-12 intact, no focal abnormalities noted Psych:  Normal affect   EKG:  The EKG was personally reviewed and demonstrates:  Sinus bradycardia, rate 44 bpm, 1st degree AV block, no STE/D, no TWI.  Telemetry:  Telemetry was personally reviewed and demonstrates:  Sinus bradycardia to the 30s with plateau's in the 50s when awake.    Relevant CV Studies: Echocardiogram 2018: - Left ventricle: The cavity size was normal. There was mild  concentric hypertrophy. Systolic function was normal. The  estimated ejection fraction was in the range of 55% to 60%. The  study is not technically sufficient to allow evaluation of LV  diastolic function.   Impressions:   - Limited, poor technical quality study. It should be repeated when  the patient is able to cooperate.   LHC 2014: Coronary angiography: Coronary dominance: right  Left mainstem: Normal  Left anterior descending (LAD):  Very large vessel. Focal 20% mid LAD. The first diagonal is a large bifurcating vessel. The smaller superior branch has an abrupt caliber change with tapering distally but no significant stenosis seen.  Left circumflex (LCx): Normal.  Right coronary artery (RCA): Large dominant vessel. 10% disease distally.  Left ventriculography: Left ventricular systolic function is normal, LVEF is estimated at 55-65%, there is no significant mitral regurgitation   Final Conclusions:   1. No significant obstructive coronary artery disease. 2. Normal LV function.  Recommendations: Medical management.  Laboratory Data:  High Sensitivity Troponin:  No results for input(s): TROPONINIHS in the last 720 hours.   Chemistry Recent Labs  Lab 06/20/20 1827 06/21/20 0452 06/22/20 0310  NA 143 143 142  K 3.3* 4.0 3.7  CL 113* 121* 112*  CO2 23 17* 20*  GLUCOSE 103* 76 95  BUN 26* 18 13  CREATININE 0.81 0.43* 0.58  CALCIUM 8.9 6.5* 8.7*  GFRNONAA >60 >60 >60  ANIONGAP Recent  Labs  Lab 06/20/20 1827 06/21/20 0452 06/22/20 0310  PROT 6.6 4.4* 6.4*  ALBUMIN 3.6 2.4* 3.4*  AST 16 13* 16  ALT ALKPHOS 137* 93 129*  BILITOT 1.0 0.9 0.9   Hematology Recent Labs  Lab 06/21/20 0452 06/21/20 0550 06/22/20 0310  WBC QUESTIONABLE RESULTS, RECOMMEND RECOLLECT TO VERIFY 10.7* 8.0  RBC QUESTIONABLE RESULTS, RECOMMEND RECOLLECT TO VERIFY 3.89 4.18  HGB QUESTIONABLE RESULTS, RECOMMEND RECOLLECT TO VERIFY 11.1* 12.0  HCT QUESTIONABLE RESULTS, RECOMMEND RECOLLECT TO VERIFY 36.0 38.9  MCV QUESTIONABLE RESULTS, RECOMMEND RECOLLECT TO VERIFY 92.5 93.1  MCH QUESTIONABLE RESULTS, RECOMMEND RECOLLECT TO VERIFY 28.5 28.7  MCHC QUESTIONABLE RESULTS, RECOMMEND RECOLLECT TO VERIFY 30.8 30.8  RDW QUESTIONABLE RESULTS, RECOMMEND RECOLLECT TO VERIFY 14.8 14.7  PLT QUESTIONABLE RESULTS, RECOMMEND RECOLLECT TO VERIFY 231 244   BNPNo results for input(s): BNP, PROBNP in the last 168 hours.  DDimer No results for input(s): DDIMER in the last 168 hours.   Radiology/Studies:  CT Head Wo Contrast  Result Date: 06/21/2020 CLINICAL DATA:  Delirium EXAM: CT HEAD WITHOUT CONTRAST TECHNIQUE: Contiguous axial images were obtained from the base of the skull through the vertex without intravenous contrast. COMPARISON:  02/11/2020 FINDINGS: Brain: Normal anatomic configuration. Parenchymal volume loss is commensurate with the patient's age. Mild periventricular white matter changes are present likely reflecting the sequela of small vessel ischemia. Remote infarcts  are again identified within the a left external capsule and subinsular white matter, left centrum semiovale, and right cerebellum. No abnormal intra or extra-axial mass lesion or fluid collection. No abnormal mass effect or midline shift. No evidence of acute intracranial hemorrhage or infarct. Ventricular size is normal. Cerebellum unremarkable. Vascular: No asymmetric hyperdense vasculature at the skull base. Skull: Intact  Sinuses/Orbits: Paranasal sinuses are clear. Orbits are unremarkable. Other: Mastoid air cells and middle ear cavities are clear. IMPRESSION: No acute intracranial abnormality. Mild senescent change. Multiple remote infarcts, stable since prior examination. Electronically Signed   By: Helyn Numbers MD   On: 06/21/2020 02:28   DG Chest Port 1 View  Result Date: 06/20/2020 CLINICAL DATA:  Fever and shortness of breath. EXAM: PORTABLE CHEST 1 VIEW COMPARISON:  Chest x-ray dated June 04, 2020. FINDINGS: The heart size and mediastinal contours are within normal limits. Normal pulmonary vascularity. Low lung volumes with mild bibasilar atelectasis. No pleural effusion or pneumothorax. No acute osseous abnormality. IMPRESSION: 1. Low lung volumes with mild bibasilar atelectasis. Electronically Signed   By: Obie Dredge M.D.   On: 06/20/2020 19:19   VAS [place] LOWER EXTREMITY VENOUS (DVT) (ONLY [facility] & [facility])  Result Date: 06/21/2020  Lower Venous DVT Study Indications: Swelling.  Risk Factors: Decreased mobility since SX Surgery Post op RLE FX repair. Comparison Study: Previous exam 01/19/2017 Performing Technologist: Ernestene Mention  Examination Guidelines: A complete evaluation includes B-mode imaging, spectral Doppler, color Doppler, and power Doppler as needed of all accessible portions of each vessel. Bilateral testing is considered an integral part of a complete examination. Limited examinations for reoccurring indications may be performed as noted. The reflux portion of the exam is performed with the patient in reverse Trendelenburg.  +---------+---------------+---------+-----------+----------+--------------+ RIGHT    CompressibilityPhasicitySpontaneityPropertiesThrombus Aging +---------+---------------+---------+-----------+----------+--------------+ CFV      Full           Yes      Yes                                 +---------+---------------+---------+-----------+----------+--------------+ SFJ       Full                                                        +---------+---------------+---------+-----------+----------+--------------+ FV Prox  Full           Yes      Yes                                 +---------+---------------+---------+-----------+----------+--------------+ FV Mid   Full           Yes      Yes                                 +---------+---------------+---------+-----------+----------+--------------+ FV DistalFull           Yes      Yes                                 +---------+---------------+---------+-----------+----------+--------------+ PFV      Full                                                        +---------+---------------+---------+-----------+----------+--------------+  POP      Full           Yes      Yes                                 +---------+---------------+---------+-----------+----------+--------------+ PTV      Full                                                        +---------+---------------+---------+-----------+----------+--------------+ PERO     Full                                                        +---------+---------------+---------+-----------+----------+--------------+   +----+---------------+---------+-----------+----------+--------------+ LEFTCompressibilityPhasicitySpontaneityPropertiesThrombus Aging +----+---------------+---------+-----------+----------+--------------+ CFV Full           Yes      Yes                                 +----+---------------+---------+-----------+----------+--------------+     Summary: RIGHT: - There is no evidence of deep vein thrombosis in the lower extremity. - There is no evidence of superficial venous thrombosis.  - No cystic structure found in the popliteal fossa.  LEFT: - No evidence of common femoral vein obstruction.  *See table(s) above for measurements and observations. Electronically signed by Coral Else MD on 06/21/2020 at 10:09:53 PM.     Final      Assessment and Plan:   1. Bradycardia: having bradycardia to the 30s at times, though remains asymptomatic. Possible this is related to her recent UTI. She is not on any AV nodal blocking agents at baseline. She denies any issues with dizziness, lightheadedness, or syncope in recent weeks or during this admission. Home requip is on hold which is her primary complaint at this time, though suspect this could be restarted. - Anticipate ordering a cardiac monitor at discharge to further evaluate bradycardia - No indication for PPM placement at this time  2. HTN: BP persistently above goal.  - Could consider increasing lisinopril to  daily for improved BP control  3. UTI: patient presented with AMS, fever, and SOB which are her typical presenting symptoms for UTI. UCx with GNR. She has been on IV antibiotics. Unclear what roll this is playing in her bradycardia - Continue management per primary team  4. OSA: not on CPAP. Likely this is contributing to bradycardia while sleeping - Continue to encourage CPAP use   Risk Assessment/Risk Scores:                For questions or updates, please contact [facility] Please consult [email or web address] for contact info under    Signed, Beatriz Stallion, PA-C  06/22/2020 12:27 PM

## 2020-06-22 NOTE — Progress Notes (Signed)
Enrolled patient for a 7 Day Zio XT monitor to be mailed to patients home  

## 2020-06-22 NOTE — Progress Notes (Signed)
PROGRESS NOTE  LOLAMAE Green  DOB: 02-04-1956  PCP: Chiquita Loth, [place] NLG:[id number]  DOA: 06/20/2020  LOS: 0 days   Chief Complaint  Patient presents with  . Shortness of Breath   Brief narrative: Heidi Green is a 65 y.o. female with PMH significant for obesity, sleep apnea not on CPAP, HTN, HLD, osteoarthritis, chronic lower back pain, history of chest pain with negative cath and normal LV function, headaches, history of intracranial hemorrhage, urolithiasis, GERD, B12 deficiency who was recently hospitalized (2/28-2/21) last month for right tibia/fibula fracture and underwent intramedullary fixation.  Patient was brought to the ED on 3/8 for shortness of breath, altered mental status, fever. In the ED, she had a temperature of 101.7 required 3 L oxygen by nasal cannula.  She has restless, agitated and received Haldol.  She then became bradycardic and required atropine with appropriate response. Labs showed potassium of 3.3 Urinalysis with hazy amber color urine trace leukocytes, many bacteria and crystals CT head showed multiple remote infarcts, no acute intracranial abnormality. Patient was admitted to hospitalist service for sepsis secondary to UTI  Subjective: Patient was seen and examined this morning. Lying on bed.  Not in distress.  Slow to respond and dysarthric but oriented x3. Yesterday, patient's heart rate dropped down as low as 20s because of which I transferred her to stepdown unit. Overnight, her heart rate remained low in 40s with occasional dips to 20s.  This morning she was given a dose of atropine.  Assessment/Plan: Sepsis secondary to UTI - POA -Presented with fever, tachypnea, abnormal urinalysis as above -Given IV Rocephin in the ED. Continue the same. -Urine culture is growing more than 100,000 CFU per mL of gram-negative rods, pending sensitivity. -Continue antibiotics and IV fluid.  Acute metabolic encephalopathy -Patient had altered mental status at  presentation.  Probably secondary to UTI.  Also contributed by the use of narcotic pain medicines. -Mental status is gradually improving.  I would Minimize the use of pain medicines.  Sinus bradycardia -Patient has remained persistently bradycardic to 40s with occasional dips to 20s.  It tends to get worse when she is sleeping and under the influence of sedatives.  She has received atropine twice in last 48 hours. -She has underlying sleep apnea and noncompliant to CPAP.   -EKG with sinus bradycardia and first-degree block and no other arrhythmias.   -Not on any AV nodal blocking agent.  Normal cath in 2014.  Pending echocardiogram.  -Will obtain cardiology consultation.  Recent right tibia/fibula fracture  -Underwent intramedullary fixation by Dr. Linna Caprice.  Patient apparently was supposed to follow-up at his office on 3/8 but was lethargic and ended up coming to the ED. -Orthopedic follow-up in the hospital appreciated.  Sutures removed.  Hypokalemia/hypomagnesemia -Improved with oral and IV repletion.  Repeat tomorrow Recent Labs  Lab 06/20/20 1827 06/21/20 0452 06/22/20 0310  K 3.3* 4.0 3.7  MG  --  1.6*  --   PHOS  --  2.6  --    Essential hypertension -Continue lisinopril 30 mg p.o. daily. -Monitor BP, renal function electrolytes.  Dyslipidemia -Continue Zetia 10 mg p.o. daily.  Morbid obesity -Body mass index is 36.07 kg/m. Patient has been advised to make an attempt to improve diet and exercise patterns to aid in weight loss.  OSA -Counseled to comply with CPAP.  History of CVA with residual right-sided deficit -Stroke 5 years ago with residual right-sided deficit, facial deviation and dysarthria  B12 deficiency -Continue cyanocobalamin oral supplement.  Gastroesophageal reflux disease -Continue PPI and famotidine.  Mobility: Limited mobility since a stroke 5 years ago and most recently right leg surgery 2 weeks ago. Code Status:   Code Status: Full Code   Nutritional status: Body mass index is 36.07 kg/m.     Diet Order            Diet heart healthy/carb modified Room service appropriate? Yes; Fluid consistency: Thin  Diet effective now                DVT prophylaxis: Place and maintain sequential compression device Start: 06/21/20 0531   Antimicrobials:  IV Rocephin Fluid: Normal saline at 50 mill per hour Consultants: Orthopedics Family Communication:  Spoke with patient's husband on the phone, 3/9  Status is: Observation  The patient will require care spanning > 2 midnights and should be moved to inpatient because: Patient needs cardiac monitoring, may be intervention, PT eval  Dispo: The patient is from: Home              Anticipated d/c is to: Home health versus SNF              Patient currently is not medically stable to d/c.   Difficult to place patient No    Infusions:  . 0.9 % NaCl with KCl 40 mEq / L 50 mL/hr at 06/22/20 1000  . cefTRIAXone (ROCEPHIN)  IV Stopped (06/21/20 2129)    Scheduled Meds: . Chlorhexidine Gluconate Cloth  6 each Topical Daily  . diclofenac Sodium  2 g Topical QID  . ezetimibe  10 mg Oral Q supper  . famotidine  20 mg Oral QHS  . ferrous sulfate  325 mg Oral Q breakfast  . folic acid  1 mg Oral Daily  . lisinopril  30 mg Oral Daily  . oxybutynin  5 mg Oral Daily  . pantoprazole  40 mg Oral Daily  . vitamin B-12  1,000 mcg Oral Daily  . [START ON 06/25/2020] Vitamin D (Ergocalciferol)  50,000 Units Oral Q Sun  . zolpidem  5 mg Oral QHS    Antimicrobials: Anti-infectives (From admission, onward)   Start     Dose/Rate Route Frequency Ordered Stop   06/21/20 1900  cefTRIAXone (ROCEPHIN) 2 g in sodium chloride 0.9 % 100 mL IVPB        2 g 200 mL/hr over 30 Minutes Intravenous Every 24 hours 06/21/20 0344     06/20/20 1900  vancomycin (VANCOREADY) IVPB 2000 mg/400 mL        2,000 mg 200 mL/hr over 120 Minutes Intravenous STAT 06/20/20 1846 06/20/20 2200   06/20/20 1900   cefTRIAXone (ROCEPHIN) 2 g in sodium chloride 0.9 % 100 mL IVPB        2 g 200 mL/hr over 30 Minutes Intravenous STAT 06/20/20 1848 06/20/20 1956   06/20/20 1845  vancomycin (VANCOCIN) IVPB 1000 mg/200 mL premix  Status:  Discontinued        1,000 mg 200 mL/hr over 60 Minutes Intravenous  Once 06/20/20 1843 06/20/20 1846   06/20/20 1845  levofloxacin (LEVAQUIN) IVPB 750 mg  Status:  Discontinued        750 mg 100 mL/hr over 90 Minutes Intravenous  Once 06/20/20 1843 06/20/20 1848      PRN meds: acetaminophen **OR** acetaminophen, albuterol, atropine, HYDROcodone-acetaminophen, prochlorperazine, senna-docusate   Objective: Vitals:   06/22/20 0900 06/22/20 1000  BP: (!) 177/81 (!) 161/70  Pulse: (!) 53 (!) 47  Resp: 17 15  Temp:    SpO2: 100% 100%    Intake/Output Summary (Last 24 hours) at 06/22/2020 1011 Last data filed at 06/22/2020 1000 Gross per 24 hour  Intake 1702.59 ml  Output 200 ml  Net 1502.59 ml   Filed Weights   06/20/20 1814 06/21/20 1723  Weight: 117 kg 110.8 kg   Weight change: -6.2 kg Body mass index is 36.07 kg/m.   Physical Exam: General exam: Pleasant elderly Caucasian female.  Not in distress Skin: No rashes, lesions or ulcers. HEENT: Atraumatic, normocephalic, no obvious bleeding Lungs: Clear to auscultation bilaterally CVS: Persistent bradycardia, no murmur GI/Abd soft, nontender, nondistended, bowel sounds CNS: Dysarthric and slow to respond but oriented x3. Psychiatry: Mood appropriate Extremities: No pedal edema, no calf tenderness.  Data Review: I have personally reviewed the laboratory data and studies available.  Recent Labs  Lab 06/20/20 1827 06/21/20 0452 06/21/20 0550 06/22/20 0310  WBC 13.5* QUESTIONABLE RESULTS, RECOMMEND RECOLLECT TO VERIFY 10.7* 8.0  NEUTROABS 10.3*  --   --  4.9  HGB 12.2 QUESTIONABLE RESULTS, RECOMMEND RECOLLECT TO VERIFY 11.1* 12.0  HCT 39.0 QUESTIONABLE RESULTS, RECOMMEND RECOLLECT TO VERIFY 36.0 38.9   MCV 91.5 QUESTIONABLE RESULTS, RECOMMEND RECOLLECT TO VERIFY 92.5 93.1  PLT 311 QUESTIONABLE RESULTS, RECOMMEND RECOLLECT TO VERIFY 231 244   Recent Labs  Lab 06/20/20 1827 06/21/20 0452 06/22/20 0310  NA 143 143 142  K 3.3* 4.0 3.7  CL 113* 121* 112*  CO2 23 17* 20*  GLUCOSE 103* 76 95  BUN 26* 18 13  CREATININE 0.81 0.43* 0.58  CALCIUM 8.9 6.5* 8.7*  MG  --  1.6*  --   PHOS  --  2.6  --     F/u labs ordered Unresulted Labs (From admission, onward)          Start     Ordered   06/21/20 0530  Occult blood card to lab, stool  Once,   STAT        06/21/20 0529          Signed, Lorin Glass, MD [facility] 06/22/2020

## 2020-06-22 NOTE — Progress Notes (Signed)
[facility] on call provider notified of sustained bradycardia 20s and 30s with rates intermittently as high as 50s. Bradycardia occurs when patient is awake and when asleep with CPAP on.Provider on call confirmed not to give atropine due to patient being asymptomatic, cardiology has stated the same plan, additionally ordering a monitor for outpatient observation for 7 days. Will continue to monitor and update provider as needed.

## 2020-06-23 DIAGNOSIS — A419 Sepsis, unspecified organism: Secondary | ICD-10-CM | POA: Diagnosis not present

## 2020-06-23 LAB — URINE CULTURE: Culture: >=100000 — AB

## 2020-06-23 MED ORDER — ROPINIROLE HCL 1 MG PO TABS
1.0000 mg | ORAL_TABLET | Freq: Two times a day (BID) | ORAL | Status: DC
  Administered 2020-06-23: 1 mg via ORAL
  Filled 2020-06-23 (×2): qty 1

## 2020-06-23 MED ORDER — HYDROCHLOROTHIAZIDE 25 MG PO TABS: 25.0000 mg | ORAL_TABLET | Freq: Every day | ORAL | 2 refills | Status: AC

## 2020-06-23 MED ORDER — CEPHALEXIN 500 MG PO CAPS: 500.0000 mg | ORAL_CAPSULE | Freq: Three times a day (TID) | ORAL | 0 refills | Status: AC

## 2020-06-23 MED ORDER — CEPHALEXIN 500 MG PO CAPS
500.0000 mg | ORAL_CAPSULE | Freq: Three times a day (TID) | ORAL | Status: DC
  Administered 2020-06-23: 500 mg via ORAL
  Filled 2020-06-23 (×2): qty 1

## 2020-06-23 MED ORDER — HYDROCHLOROTHIAZIDE 25 MG PO TABS
25.0000 mg | ORAL_TABLET | Freq: Every day | ORAL | Status: DC
Start: 2020-06-23 — End: 2020-06-23
  Administered 2020-06-23: 25 mg via ORAL
  Filled 2020-06-23: qty 1

## 2020-06-23 NOTE — Discharge Instructions (Signed)
An order has been placed for a 1 week cardiac monitor. The cardiology office will contact you with further information/instructions. The monitor will be mailed to your home shortly after discharge. You have been scheduled to see Dr. Elberta Fortis with our electrophysiology team (specializes in the electrical activity in the heart) after completion of your heart monitor.

## 2020-06-23 NOTE — Progress Notes (Signed)
Physical Therapy Treatment Patient Details Name: Heidi Green MRN: [id number] DOB: June 28, 1955 Today's Date: 06/23/2020    History of Present Illness Heidi Green is a 65 y.o. female with PMH significant for obesity, sleep apnea , HTN, HLD, osteoarthritis, chronic lower back pain, history of chest pain , headaches, history of intracranial hemorrhage with right hemiparesis, urolithiasis, GERD, B12 deficiency who was recently hospitalized (2/28-2/21) last month for right tibia/fibula fracture and underwent intramedullary fixation and was DC'd home.Patient was brought to the ED on 3/8 for shortness of breath, altered mental status, fever.  In the ED, she had a temperature of 101.7 required 3 L oxygen by nasal cannula.   She has restless, agitated and received Haldol.  She then became bradycardic and required atropine with appropriate response.CT head showed multiple remote infarcts, no acute intracranial abnormality    PT Comments    Patient required mod/max assistance  And much effort for stand pivot to [facility],, held onto Rw with LUE.  then for use of sliding board back onto bed.Cues for NWB, patient did not place weight on RLE. Patient requesting a shorter sliding board for in home use.  Patient uses longer board for car transfers.  patient's hr noted to range from 99 at rest  Up to 144 during mobility. RN notified.  Discussed recommendation to transport patient to home via  [facility], patient declines.  Plans are Dc today.  Follow Up Recommendations  Home health PT     Equipment Recommendations   (pt. requesting a 24" sliding board.)    Recommendations for Other Services       Precautions / Restrictions Precautions Precautions: Fall Precaution Comments: monitor HR Restrictions Weight Bearing Restrictions: Yes RLE Weight Bearing: Non weight bearing    Mobility  Bed Mobility Overal bed mobility: Needs Assistance Bed Mobility: Supine to Sit     Supine to sit: HOB elevated;Supervision Sit to  supine: Mod assist;+2 for safety/equipment   General bed mobility comments: min assist to sit up to bed edge, pulled with RUE. mod assist with legs back onto bed    Transfers Overall transfer level: Needs assistance Equipment used: Rolling walker (2 wheeled);Sliding board Transfers: Squat Pivot Transfers;Lateral/Scoot Transfers   Stand pivot transfers: Min assist Squat pivot transfers: +2 physical assistance;+2 safety/equipment;From elevated surface    Lateral/Scoot Transfers: Mod assist;With slide board;+2 safety/equipment;+2 physical assistance General transfer comment: patient stood from bed, holding to Rw with mod +2 for safety, extra time to attempt to turn to drop arm BSC. Patient  finally able to turn enough to sit to [facility].  Used sliding board  for return to bed per pt. request. Patient required max assistance to place board and slide back onto bed. leading with right  weaker side. cues for nWB right leg.  Ambulation/Gait                 Stairs             Wheelchair Mobility    Modified Rankin (Stroke Patients Only)       Balance Overall balance assessment: History of Falls Sitting-balance support: Feet supported;No upper extremity supported Sitting balance-Leahy Scale: Good     Standing balance support: Bilateral upper extremity supported Standing balance-Leahy Scale: Poor Standing balance comment: mod A x2 and heavy reliance of UE support                            Cognition Arousal/Alertness: Awake/alert Behavior During  Therapy: WFL for tasks assessed/performed Overall Cognitive Status: No family/caregiver present to determine baseline cognitive functioning                         Following Commands: Follows multi-step commands consistently       General Comments: poor insight to deficits; patient with word finding difficulty, speach mumbling at times.Patient  not clear on trnasfers being done  since fx.      Exercises       General Comments        Pertinent Vitals/Pain Pain Assessment: No/denies pain Faces Pain Scale: Hurts little more Pain Descriptors / Indicators: Grimacing;Sore Pain Intervention(s): Monitored during session    Home Living Family/patient expects to be discharged to:: Private residence Living Arrangements: Spouse/significant other Available Help at Discharge: Family;Available 24 hours/day Type of Home: House Home Access: Ramped entrance   Home Layout: Two level;Bed/bath upstairs Home Equipment: Walker - 2 wheels;Hospital bed;Bedside commode;Tub bench;Other (comment);Electric scooter Additional Comments: has sliding board, supposed to be getting a drop arm BSC    Prior Function Level of Independence: Needs assistance  Gait / Transfers Assistance Needed: nonambulatory. per pt, she tranasfers to [facility] without assistance but yet uses sliding board. ? realiability .o/from WC with Mod Ind ADL's / Homemaking Assistance Needed: uses BSC on first level w/ no BR.     PT Goals (current goals can now be found in the care plan section) Acute Rehab PT Goals Patient Stated Goal: go home Progress towards PT goals: Progressing toward goals    Frequency    Min 3X/week      PT Plan Current plan remains appropriate    Co-evaluation PT/OT/SLP Co-Evaluation/Treatment: Yes Reason for Co-Treatment: Complexity of the patient's impairments (multi-system involvement);For patient/therapist safety PT goals addressed during session: Mobility/safety with mobility OT goals addressed during session: ADL's and self-care      AM-PAC PT "6 Clicks" Mobility   Outcome Measure  Help needed turning from your back to your side while in a flat bed without using bedrails?: A Lot Help needed moving from lying on your back to sitting on the side of a flat bed without using bedrails?: A Lot Help needed moving to and from a bed to a chair (including a wheelchair)?: A Lot Help needed standing up from a chair  using your arms (e.g., wheelchair or bedside chair)?: A Lot Help needed to walk in hospital room?: Total Help needed climbing 3-5 steps with a railing? : Total 6 Click Score: 10    End of Session Equipment Utilized During Treatment: Gait belt Activity Tolerance: Patient tolerated treatment well Patient left: in bed;with call bell/phone within reach;with bed alarm set Nurse Communication: Mobility status PT Visit Diagnosis: Other abnormalities of gait and mobility (R26.89);Pain Pain - Right/Left: Right Pain - part of body: Leg     Time: 2409-7353 PT Time Calculation (min) (ACUTE ONLY): 36 min  Charges:  $Therapeutic Activity: 8-22 mins                     Blanchard Kelch PT Acute Rehabilitation Services Pager [phone] Office [phone]    Rada Hay 06/23/2020, 12:50 PM

## 2020-06-23 NOTE — Progress Notes (Signed)
Inpatient Rehab Admissions Coordinator:   Note PT and OT recommending home with home health.  Will sign off for CIR at this time.   Estill Dooms, PT, DPT Admissions Coordinator [phone] 06/23/20  12:34 PM

## 2020-06-23 NOTE — Evaluation (Signed)
Occupational Therapy Evaluation Patient Details Name: Heidi Green MRN: [id number] DOB: 1955-11-07 Today's Date: 06/23/2020    History of Present Illness Heidi Green is a 65 y.o. female with PMH significant for obesity, sleep apnea , HTN, HLD, osteoarthritis, chronic lower back pain, history of chest pain , headaches, history of intracranial hemorrhage with right hemiparesis, urolithiasis, GERD, B12 deficiency who was recently hospitalized (2/28-2/21) last month for right tibia/fibula fracture and underwent intramedullary fixation and was DC'd home.Patient was brought to the ED on 3/8 for shortness of breath, altered mental status, fever.  In the ED, she had a temperature of 101.7 required 3 L oxygen by nasal cannula.   She has restless, agitated and received Haldol.  She then became bradycardic and required atropine with appropriate response.CT head showed multiple remote infarcts, no acute intracranial abnormality   Clinical Impression   Heidi Green is a 65 year old woman who presents with above pertinent medical history. On evaluation she presents with left hemiparesis, NWB status of LLE, overall generalized weakness, decreased activity tolerance, impaired balance and obesity. Patient exhibited difficulty with transfers - see below for detail - and needing assistance with toileting and LB dressing. Patient reports more independence and ability in home environment and adamant about returning home at discharge. Patient reporting wanting another slide board and reports she will be receiving a drop arm BSC today. Reports she is getting home health services. Patient will benefit from skilled OT services while in hospital to maintain and improve abilities. Recommend continued HH services at discharge.    Follow Up Recommendations  Home health OT    Equipment Recommendations  None recommended by OT    Recommendations for Other Services       Precautions / Restrictions Precautions Precautions:  Fall Precaution Comments: monitor HR Restrictions Weight Bearing Restrictions: Yes RLE Weight Bearing: Non weight bearing      Mobility Bed Mobility Overal bed mobility: Needs Assistance Bed Mobility: Supine to Sit     Supine to sit: HOB elevated;Supervision Sit to supine: Mod assist;+2 for safety/equipment   General bed mobility comments: mod assist for LEs to return to supine.    Transfers Overall transfer level: Needs assistance Equipment used: Rolling walker (2 wheeled);Sliding board Transfers: Squat Pivot Transfers;Lateral/Scoot Transfers   Stand pivot transfers: Min assist Squat pivot transfers: +2 physical assistance;+2 safety/equipment;From elevated surface    Lateral/Scoot Transfers: Mod assist;With slide board;+2 safety/equipment;+2 physical assistance      Balance Overall balance assessment: History of Falls;Needs assistance Sitting-balance support: Feet supported;No upper extremity supported Sitting balance-Leahy Scale: Good     Standing balance support: Bilateral upper extremity supported Standing balance-Leahy Scale: Poor                             ADL either performed or assessed with clinical judgement   ADL Overall ADL's : Needs assistance/impaired Eating/Feeding: Independent   Grooming: Set up   Upper Body Bathing: Set up   Lower Body Bathing: Minimal assistance;Set up Lower Body Bathing Details (indicate cue type and reason): buttocks, gluteal cleft Upper Body Dressing : Set up   Lower Body Dressing: Moderate assistance;Bed level Lower Body Dressing Details (indicate cue type and reason): needed to return to supine and roll in bed to assist with getting pants up over hips Toilet Transfer: BSC;Squat-pivot;RW;Transfer board;Moderate assistance;+2 for physical assistance;+2 for safety/equipment Toilet Transfer Details (indicate cue type and reason): Initially min x 2 to use RW  and transfer to [facility] - increased time and patient  exhibiting difficulty advancing LLE to make pivot. On return attempted to stand patient unable. used sliding board requiring mod x 2 to return to bed - with patient exhibiting signifficant difficulty assisting with scooting transfer and resulting in patient lying back on the bed to complete transfer of hips and legs. Toileting- Clothing Manipulation and Hygiene: Moderate assistance;Sitting/lateral lean               Vision Patient Visual Report: No change from baseline       Perception     Praxis      Pertinent Vitals/Pain Pain Assessment: No/denies pain     Hand Dominance     Extremity/Trunk Assessment Upper Extremity Assessment Upper Extremity Assessment: RUE deficits/detail;LUE deficits/detail RUE Deficits / Details: hx CVA grossly 3+/5 LUE Deficits / Details: WFL ROM and strength.   Lower Extremity Assessment Lower Extremity Assessment: Defer to PT evaluation       Communication     Cognition Arousal/Alertness: Awake/alert Behavior During Therapy: WFL for tasks assessed/performed Overall Cognitive Status: No family/caregiver present to determine baseline cognitive functioning                         Following Commands: Follows multi-step commands consistently           General Comments       Exercises     Shoulder Instructions      Home Living Family/patient expects to be discharged to:: Private residence Living Arrangements: Spouse/significant other Available Help at Discharge: Family;Available 24 hours/day Type of Home: House Home Access: Ramped entrance     Home Layout: Two level;Bed/bath upstairs Alternate Level Stairs-Number of Steps: flight - has chair lift   Bathroom Shower/Tub: Tub/shower unit         Home Equipment: Walker - 2 wheels;Hospital bed;Bedside commode;Tub bench;Other (comment);Electric scooter   Additional Comments: has sliding board, supposed to be getting a drop arm BSC      Prior Functioning/Environment  Level of Independence: Needs assistance  Gait / Transfers Assistance Needed: nonambulatory. per pt, she tranasfers to [facility] without assistance but yet uses sliding board. ? realiability .o/from WC with Mod Ind ADL's / Homemaking Assistance Needed: uses BSC on first level w/ no BR.            OT Problem List: Decreased strength;Decreased activity tolerance;Impaired balance (sitting and/or standing);Decreased cognition;Decreased safety awareness;Decreased knowledge of use of DME or AE;Obesity;Pain      OT Treatment/Interventions: Therapeutic exercise;Self-care/ADL training;DME and/or AE instruction;Therapeutic activities;Cognitive remediation/compensation;Patient/family education;Balance training    OT Goals(Current goals can be found in the care plan section) Acute Rehab OT Goals Patient Stated Goal: go home OT Goal Formulation: With patient Time For Goal Achievement: 07/07/20 Potential to Achieve Goals: Fair  OT Frequency: Min 2X/week   Barriers to D/C:            Co-evaluation PT/OT/SLP Co-Evaluation/Treatment: Yes Reason for Co-Treatment: For patient/therapist safety;To address functional/ADL transfers PT goals addressed during session: Mobility/safety with mobility OT goals addressed during session: ADL's and self-care      AM-PAC OT "6 Clicks" Daily Activity     Outcome Measure Help from another person eating meals?: None Help from another person taking care of personal grooming?: A Little Help from another person toileting, which includes using toliet, bedpan, or urinal?: A Little Help from another person bathing (including washing, rinsing, drying)?: A Little Help from another person to put on  and taking off regular upper body clothing?: A Little Help from another person to put on and taking off regular lower body clothing?: A Lot 6 Click Score: 18   End of Session Equipment Utilized During Treatment: Rolling walker;Other (comment) (slide board) Nurse Communication:  Mobility status  Activity Tolerance: Patient tolerated treatment well Patient left: in bed;with call bell/phone within reach  OT Visit Diagnosis: Other abnormalities of gait and mobility (R26.89);Muscle weakness (generalized) (M62.81);History of falling (Z91.81);Pain;Other symptoms and signs involving cognitive function                Time: 0950-1026 OT Time Calculation (min): 36 min Charges:  OT General Charges $OT Visit: 1 Visit OT Evaluation $OT Eval Moderate Complexity: 1 Mod  Dalen Hennessee, OTR/L Acute Care Rehab Services  Office [phone] Pager: [phone]    Kelli Churn 06/23/2020, 11:45 AM

## 2020-06-23 NOTE — Discharge Summary (Signed)
PROGRESS NOTE  Heidi Green  DOB: 11-29-1955  PCP: Chiquita Loth, [place] QQV:[id number]  DOA: 06/20/2020  LOS: 1 day   Chief Complaint  Patient presents with  . Shortness of Breath   Brief narrative: Heidi Green is a 65 y.o. female with PMH significant for obesity, sleep apnea not on CPAP, HTN, HLD, osteoarthritis, chronic lower back pain, history of chest pain with negative cath and normal LV function, headaches, history of intracranial hemorrhage, urolithiasis, GERD, B12 deficiency who was recently hospitalized (2/28-2/21) last month for right tibia/fibula fracture and underwent intramedullary fixation.  Patient was brought to the ED on 3/8 for shortness of breath, altered mental status, fever. In the ED, she had a temperature of 101.7 required 3 L oxygen by nasal cannula.  She has restless, agitated and received Haldol.  She then became bradycardic and required atropine with appropriate response. Labs showed potassium of 3.3 Urinalysis with hazy amber color urine trace leukocytes, many bacteria and crystals CT head showed multiple remote infarcts, no acute intracranial abnormality. Patient was admitted to hospitalist service for sepsis secondary to UTI  Subjective: Patient was seen and examined this morning. Lying on bed.  Not in distress.  Slow to respond and dysarthric but oriented x3. Mental status has improved.  Heart rate is improved now.  Hospital course: Sepsis secondary to UTI - POA -Presented with fever, tachypnea, abnormal urinalysis as above -Initially started on IV Rocephin.  Adequately hydrated. -Urine culture grew more than 100,000 CFU per mL of E. coli sensitive to cefazolin.  Will discharge on 5 more days of oral Keflex.   Acute metabolic encephalopathy -Patient had altered mental status at presentation.  Probably secondary to UTI.  Also contributed by the use of narcotic pain medicines. -Mental status gradually improved back to normal.  Recommend to minimize use  of mood altering medications.  Per discussion with patient and her husband, I stopped Lyrica  Sinus bradycardia -During the course of hospitalization, patient remained frequently bradycardic to 40s with occasional dips to 20s. It tends to get worse when she is sleeping and under the influence of sedatives. She has received atropine twice during this stay. She has underlying sleep apnea and noncompliant to CPAP.   -EKG with sinus bradycardia and first-degree block and no other arrhythmias.   -Not on any AV nodal blocking agent.  Normal cath in 2014.  Echocardiogram showed EF of 55 to 60%.  Cardiology consultation was obtained.  No acute intervention needed.  Expect improvement if patient will be compliant to CPAP. -ZIO patch placed at discharge.  To follow-up with cardiology as an outpatient.  Recent right tibia/fibula fracture  -Underwent intramedullary fixation by Dr. Linna Caprice.  Patient apparently was supposed to follow-up at his office on 3/8 but was lethargic and ended up coming to the ED. -Orthopedic follow-up in the hospital appreciated.  Sutures removed.  Hypokalemia/hypomagnesemia -Improved with with replacement.    Essential hypertension -Continue lisinopril 30 mg p.o. daily. -I also added HCTZ 25 mg daily because of persistently elevated blood pressure in the hospital.  Dyslipidemia -Continue Zetia 10 mg p.o. daily.  Morbid obesity -Body mass index is 36.07 kg/m. Patient has been advised to make an attempt to improve diet and exercise patterns to aid in weight loss.  OSA -Counseled to comply with CPAP.  History of CVA with residual right-sided deficit -Stroke 5 years ago with residual right-sided deficit, facial deviation and dysarthria -Continue aspirin and Zetia as before.  B12 deficiency -Continue cyanocobalamin oral supplement.  Gastroesophageal reflux disease -Continue PPI and famotidine.   Wound care: Wound / Incision (Open or Dehisced) 12/19/19 Non-pressure  wound Leg Left;Medial (Active)  Date First Assessed/Time First Assessed: 12/19/19 2200   Wound Type: Non-pressure wound  Location: Leg  Location Orientation: Left;Medial  Present on Admission: Yes    Assessments 12/19/2019 10:00 PM 12/21/2019  9:17 AM  Dressing Type Abdominal pads;Impregnated gauze (petrolatum);Paper tape Foam - Lift dressing to assess site every shift;Other (Comment)  Dressing Changed New Other (Comment)  Dressing Status Intact;Dry;Clean Clean;Dry;Intact  Dressing Change Frequency PRN Daily  Site / Wound Assessment Red Clean;Dry  Peri-wound Assessment Erythema (blanchable) --  Wound Length (cm) 3 cm --  Wound Width (cm) 2.5 cm --  Wound Depth (cm) 0 cm --  Wound Volume (cm^3) 0 cm^3 --  Wound Surface Area (cm^2) 7.5 cm^2 --  Tunneling (cm) 0 --  Undermining (cm) 0 --  Margins Attached edges (approximated) --  Closure None --  Drainage Amount None --  Non-staged Wound Description Not applicable --  Treatment Cleansed --     No Linked orders to display     Wound / Incision (Open or Dehisced) 06/21/20 (IAD) Incontinence Associated Dermatitis Buttocks Right;Left;Mid (Active)  Date First Assessed/Time First Assessed: 06/21/20 1859   Wound Type: (IAD) Incontinence Associated Dermatitis  Location: Buttocks  Location Orientation: Right;Left;Mid  Present on Admission: Yes    Assessments 06/22/2020  8:19 AM 06/22/2020  8:00 PM  Dressing Type Foam - Lift dressing to assess site every shift Foam - Lift dressing to assess site every shift  Dressing Status Clean;Dry;Intact Clean;Dry;Intact  Dressing Change Frequency Every 3 days Every 3 days  Treatment Cleansed Cleansed     No Linked orders to display     Incision (Closed) 06/21/20 Pretibial Right (Active)  Date First Assessed/Time First Assessed: 06/21/20 1859   Location: Pretibial  Location Orientation: Right  Present on Admission: Yes    Assessments 06/22/2020  8:00 PM  Dressing Type Foam - Lift dressing to assess site every  shift  Dressing Dry;Intact  Dressing Change Frequency PRN  Site / Wound Assessment Dry;Clean     No Linked orders to display    Discharge Exam:   Vitals:   06/23/20 0700 06/23/20 0800 06/23/20 0900 06/23/20 1000  BP: (!) 142/66 (!) 156/84    Pulse: 78 80 72   Resp: 15 14 19 16   Temp:  97.7 F (36.5 C)    TempSrc:  Oral    SpO2: 97% 95% 97%   Weight:      Height:        Body mass index is 36.07 kg/m.  General exam: Pleasant, elderly Caucasian female.  Obese not in pain or distress Skin: No rashes, lesions or ulcers. HEENT: Atraumatic, normocephalic, no obvious bleeding Lungs: Clear to auscultation bilaterally CVS: Regular rate and rhythm, no murmur GI/Abd soft, distended from obesity, nontender, bowel sound present CNS: Alert, awake, oriented x3.  Dysarthric at baseline because of previous stroke Psychiatry: Mood appropriate Extremities: No pedal edema, no calf tenderness  Follow ups:   Discharge Instructions    Diet - low sodium heart healthy   Complete by: As directed    Increase activity slowly   Complete by: As directed    No dressing needed   Complete by: As directed       Follow-up Information    , PA Follow up.   Specialty: Physician Assistant Contact information: [address] [place] [phone]  Recommendations for Outpatient Follow-Up:   1. Follow-up with PCP as an outpatient 2. Follow-up with cardiology as an outpatient  Discharge Instructions:  Follow with Primary MD Chiquita Lothoolidge, [place], [place] in 7 days   Get CBC/BMP checked in next visit within 1 week by PCP or SNF MD ( we routinely change or add medications that can affect your baseline labs and fluid status, therefore we recommend that you get the mentioned basic workup next visit with your PCP, your PCP may decide not to get them or add new tests based on their clinical decision)  On your next visit with your PCP, please Get Medicines  reviewed and adjusted.  Please request your PCP  to go over all Hospital Tests and Procedure/Radiological results at the follow up, please get all Hospital records sent to your Prim MD by signing hospital release before you go home.  Activity: As tolerated with Full fall precautions use walker/cane & assistance as needed  For Heart failure patients - Check your Weight same time everyday, if you gain over 2 pounds, or you develop in leg swelling, experience more shortness of breath or chest pain, call your Primary MD immediately. Follow Cardiac Low Salt Diet and 1.5 lit/day fluid restriction.  If you have smoked or chewed Tobacco in the last 2 yrs please stop smoking, stop any regular Alcohol  and or any Recreational drug use.  If you experience worsening of your admission symptoms, develop shortness of breath, life threatening emergency, suicidal or homicidal thoughts you must seek medical attention immediately by calling 911 or calling your MD immediately  if symptoms less severe.  You Must read complete instructions/literature along with all the possible adverse reactions/side effects for all the Medicines you take and that have been prescribed to you. Take any new Medicines after you have completely understood and accpet all the possible adverse reactions/side effects.   Do not drive, operate heavy machinery, perform activities at heights, swimming or participation in water activities or provide baby sitting services if your were admitted for syncope or siezures until you have seen by Primary MD or a Neurologist and advised to do so again.  Do not drive when taking Pain medications.  Do not take more than prescribed Pain, Sleep and Anxiety Medications  Wear Seat belts while driving.   Please note You were cared for by a hospitalist during your hospital stay. If you have any questions about your discharge medications or the care you received while you were in the hospital after you are  discharged, you can call the unit and asked to speak with the hospitalist on call if the hospitalist that took care of you is not available. Once you are discharged, your primary care physician will handle any further medical issues. Please note that NO REFILLS for any discharge medications will be authorized once you are discharged, as it is imperative that you return to your primary care physician (or establish a relationship with a primary care physician if you do not have one) for your aftercare needs so that they can reassess your need for medications and monitor your lab values.    Allergies as of 06/23/2020      Reactions   Buprenorphine Itching   Garamycin [gentamicin Sulfate] Hives, Shortness Of Breath   Other Anaphylaxis   Reaction to HORSE SERUM   Penicillins Anaphylaxis, Itching, Swelling   Swelling and shortness of breath after taking penicillin for a tooth infection when patient was 24. Per her account, she believes that  she has taken amoxicillin and cephalosporins since that time and had no issues.    Atorvastatin Nausea And Vomiting   Norvasc [amlodipine Besylate] Nausea And Vomiting   Rosuvastatin Diarrhea, Other (See Comments)   Spasms in hand      Medication List    STOP taking these medications   pregabalin 150 MG capsule Commonly known as: LYRICA     TAKE these medications   albuterol 108 (90 Base) MCG/ACT inhaler Commonly known as: VENTOLIN HFA Inhale 2 puffs into the lungs every 6 (six) hours as needed for wheezing or shortness of breath.   aspirin 81 MG chewable tablet Commonly known as: Aspirin Childrens Chew 1 tablet (81 mg total) by mouth 2 (two) times daily with a meal. What changed: when to take this   cephALEXin 500 MG capsule Commonly known as: KEFLEX Take 1 capsule (500 mg total) by mouth every 8 (eight) hours for 4 days.   diclofenac Sodium 1 % Gel Commonly known as: VOLTAREN Apply 2 g topically 4 (four) times daily.   esomeprazole 40 MG  capsule Commonly known as: NEXIUM Take 40 mg by mouth 2 (two) times daily before a meal.   ezetimibe 10 MG tablet Commonly known as: ZETIA Take 10 mg by mouth daily with supper.   famotidine 10 MG tablet Commonly known as: PEPCID Take 10 mg by mouth at bedtime.   FeroSul 325 (65 FE) MG tablet Generic drug: ferrous sulfate Take 325 mg by mouth daily with breakfast.   folic acid 1 MG tablet Commonly known as: FOLVITE Take 1 tablet (1 mg total) by mouth daily.   hydrochlorothiazide 25 MG tablet Commonly known as: HYDRODIURIL Take 1 tablet (25 mg total) by mouth daily. Start taking on: June 24, 2020   HYDROcodone-acetaminophen 10-325 MG tablet Commonly known as: NORCO Take 1 tablet by mouth every 4 (four) hours as needed for moderate pain or severe pain. What changed: when to take this   lisinopril 30 MG tablet Commonly known as: ZESTRIL Take 1 tablet (30 mg total) by mouth daily.   oxybutynin 5 MG 24 hr tablet Commonly known as: DITROPAN-XL Take 5 mg by mouth daily.   rOPINIRole 2 MG tablet Commonly known as: REQUIP Take 2 mg by mouth 2 (two) times daily.   senna-docusate 8.6-50 MG tablet Commonly known as: Senokot-S Take 1 tablet by mouth 2 (two) times daily. What changed:   when to take this  reasons to take this   vitamin B-12 1000 MCG tablet Commonly known as: CYANOCOBALAMIN Take 1 tablet (1,000 mcg total) by mouth daily.   Vitamin D (Ergocalciferol) 1.25 MG (50000 UNIT) Caps capsule Commonly known as: DRISDOL Take 50,000 Units by mouth every Sunday.   zolpidem 10 MG tablet Commonly known as: AMBIEN Take 5 mg by mouth at bedtime.            Discharge Care Instructions  (From admission, onward)         Start     Ordered   06/23/20 0000  No dressing needed        03 /11/22 1124          Time coordinating discharge: 35 minutes  The results of significant diagnostics from this hospitalization (including imaging, microbiology, ancillary  and laboratory) are listed below for reference.    Procedures and Diagnostic Studies:   CT Head Wo Contrast  Result Date: 06/21/2020 CLINICAL DATA:  Delirium EXAM: CT HEAD WITHOUT CONTRAST TECHNIQUE: Contiguous axial images were obtained from the  base of the skull through the vertex without intravenous contrast. COMPARISON:  02/11/2020 FINDINGS: Brain: Normal anatomic configuration. Parenchymal volume loss is commensurate with the patient's age. Mild periventricular white matter changes are present likely reflecting the sequela of small vessel ischemia. Remote infarcts are again identified within the a left external capsule and subinsular white matter, left centrum semiovale, and right cerebellum. No abnormal intra or extra-axial mass lesion or fluid collection. No abnormal mass effect or midline shift. No evidence of acute intracranial hemorrhage or infarct. Ventricular size is normal. Cerebellum unremarkable. Vascular: No asymmetric hyperdense vasculature at the skull base. Skull: Intact Sinuses/Orbits: Paranasal sinuses are clear. Orbits are unremarkable. Other: Mastoid air cells and middle ear cavities are clear. IMPRESSION: No acute intracranial abnormality. Mild senescent change. Multiple remote infarcts, stable since prior examination. Electronically Signed   By: Helyn Numbers MD   On: 06/21/2020 02:28   DG Chest Port 1 View  Result Date: 06/20/2020 CLINICAL DATA:  Fever and shortness of breath. EXAM: PORTABLE CHEST 1 VIEW COMPARISON:  Chest x-ray dated June 04, 2020. FINDINGS: The heart size and mediastinal contours are within normal limits. Normal pulmonary vascularity. Low lung volumes with mild bibasilar atelectasis. No pleural effusion or pneumothorax. No acute osseous abnormality. IMPRESSION: 1. Low lung volumes with mild bibasilar atelectasis. Electronically Signed   By: Obie Dredge M.D.   On: 06/20/2020 19:19   VAS [place] LOWER EXTREMITY VENOUS (DVT) (ONLY [facility] & [facility])  Result Date:  06/21/2020  Lower Venous DVT Study Indications: Swelling.  Risk Factors: Decreased mobility since SX Surgery Post op RLE FX repair. Comparison Study: Previous exam 01/19/2017 Performing Technologist: Ernestene Mention  Examination Guidelines: A complete evaluation includes B-mode imaging, spectral Doppler, color Doppler, and power Doppler as needed of all accessible portions of each vessel. Bilateral testing is considered an integral part of a complete examination. Limited examinations for reoccurring indications may be performed as noted. The reflux portion of the exam is performed with the patient in reverse Trendelenburg.  +---------+---------------+---------+-----------+----------+--------------+ RIGHT    CompressibilityPhasicitySpontaneityPropertiesThrombus Aging +---------+---------------+---------+-----------+----------+--------------+ CFV      Full           Yes      Yes                                 +---------+---------------+---------+-----------+----------+--------------+ SFJ      Full                                                        +---------+---------------+---------+-----------+----------+--------------+ FV Prox  Full           Yes      Yes                                 +---------+---------------+---------+-----------+----------+--------------+ FV Mid   Full           Yes      Yes                                 +---------+---------------+---------+-----------+----------+--------------+ FV DistalFull           Yes      Yes                                 +---------+---------------+---------+-----------+----------+--------------+  PFV      Full                                                        +---------+---------------+---------+-----------+----------+--------------+ POP      Full           Yes      Yes                                 +---------+---------------+---------+-----------+----------+--------------+ PTV      Full                                                         +---------+---------------+---------+-----------+----------+--------------+ PERO     Full                                                        +---------+---------------+---------+-----------+----------+--------------+   +----+---------------+---------+-----------+----------+--------------+ LEFTCompressibilityPhasicitySpontaneityPropertiesThrombus Aging +----+---------------+---------+-----------+----------+--------------+ CFV Full           Yes      Yes                                 +----+---------------+---------+-----------+----------+--------------+     Summary: RIGHT: - There is no evidence of deep vein thrombosis in the lower extremity. - There is no evidence of superficial venous thrombosis.  - No cystic structure found in the popliteal fossa.  LEFT: - No evidence of common femoral vein obstruction.  *See table(s) above for measurements and observations. Electronically signed by Coral Else MD on 06/21/2020 at 10:09:53 PM.    Final      Labs:   Basic Metabolic Panel: Recent Labs  Lab 06/20/20 1827 06/21/20 0452 06/22/20 0310  NA 143 143 142  K 3.3* 4.0 3.7  CL 113* 121* 112*  CO2 23 17* 20*  GLUCOSE 103* 76 95  BUN 26* 18 13  CREATININE 0.81 0.43* 0.58  CALCIUM 8.9 6.5* 8.7*  MG  --  1.6*  --   PHOS  --  2.6  --    GFR Estimated Creatinine Clearance: 93 mL/min (by C-G formula based on SCr of 0.58 mg/dL). Liver Function Tests: Recent Labs  Lab 06/20/20 1827 06/21/20 0452 06/22/20 0310  AST 16 13* 16  ALT 11 7 12   ALKPHOS 137* 93 129*  BILITOT 1.0 0.9 0.9  PROT 6.6 4.4* 6.4*  ALBUMIN 3.6 2.4* 3.4*   No results for input(s): LIPASE, AMYLASE in the last 168 hours. No results for input(s): AMMONIA in the last 168 hours. Coagulation profile Recent Labs  Lab 06/20/20 1827  INR 1.2    CBC: Recent Labs  Lab 06/20/20 1827 06/21/20 0452 06/21/20 0550 06/22/20 0310  WBC 13.5* QUESTIONABLE RESULTS,  RECOMMEND RECOLLECT TO VERIFY 10.7* 8.0  NEUTROABS 10.3*  --   --  4.9  HGB 12.2 QUESTIONABLE RESULTS, RECOMMEND RECOLLECT TO VERIFY 11.1* 12.0  HCT 39.0  QUESTIONABLE RESULTS, RECOMMEND RECOLLECT TO VERIFY 36.0 38.9  MCV 91.5 QUESTIONABLE RESULTS, RECOMMEND RECOLLECT TO VERIFY 92.5 93.1  PLT 311 QUESTIONABLE RESULTS, RECOMMEND RECOLLECT TO VERIFY 231 244   Cardiac Enzymes: No results for input(s): CKTOTAL, CKMB, CKMBINDEX, TROPONINI in the last 168 hours. BNP: Invalid input(s): POCBNP CBG: Recent Labs  Lab 06/21/20 1532  GLUCAP 79   D-Dimer No results for input(s): DDIMER in the last 72 hours. Hgb A1c No results for input(s): HGBA1C in the last 72 hours. Lipid Profile No results for input(s): CHOL, HDL, LDLCALC, TRIG, CHOLHDL, LDLDIRECT in the last 72 hours. Thyroid function studies No results for input(s): TSH, T4TOTAL, T3FREE, THYROIDAB in the last 72 hours.  Invalid input(s): FREET3 Anemia work up No results for input(s): VITAMINB12, FOLATE, FERRITIN, TIBC, IRON, RETICCTPCT in the last 72 hours. Microbiology Recent Results (from the past 240 hour(s))  Blood Culture (routine x 2)     Status: None (Preliminary result)   Collection Time: 06/20/20  6:27 PM   Specimen: BLOOD  Result Value Ref Range Status   Specimen Description   Final    BLOOD RIGHT ANTECUBITAL Performed at [facility], [address], [place], [place] [address]    Special Requests   Final    BOTTLES DRAWN AEROBIC AND ANAEROBIC Blood Culture adequate volume Performed at [facility], [address] [place], [place] [address]    Culture   Final    NO GROWTH 2 DAYS Performed at [facility], [address], [place], [place] [address]    Report Status PENDING  Incomplete  Blood Culture (routine x 2)     Status: None (Preliminary result)   Collection Time: 06/20/20  7:30 PM   Specimen: BLOOD  Result Value Ref Range Status   Specimen Description   Final     BLOOD RIGHT ANTECUBITAL Performed at [facility], [address], [place], [place] [address]    Special Requests   Final    BOTTLES DRAWN AEROBIC AND ANAEROBIC Blood Culture adequate volume Performed at [facility], [address] [place], [place] [address]    Culture   Final    NO GROWTH 2 DAYS Performed at [facility], [address] [place], [place] [address]    Report Status PENDING  Incomplete  Resp Panel by RT-PCR (Flu A&B, Covid) Nasopharyngeal Swab     Status: None   Collection Time: 06/20/20  7:31 PM   Specimen: Nasopharyngeal Swab; Nasopharyngeal(NP) swabs in vial transport medium  Result Value Ref Range Status   SARS Coronavirus 2 by RT PCR NEGATIVE NEGATIVE Final    Comment: (NOTE) SARS-CoV-2 target nucleic acids are NOT DETECTED.  The SARS-CoV-2 RNA is generally detectable in upper respiratory specimens during the acute phase of infection. The lowest concentration of SARS-CoV-2 viral copies this assay can detect is 138 copies/mL. A negative result does not preclude SARS-Cov-2 infection and should not be used as the sole basis for treatment or other patient management decisions. A negative result may occur with  improper specimen collection/handling, submission of specimen other than nasopharyngeal swab, presence of viral mutation(s) within the areas targeted by this assay, and inadequate number of viral copies(<138 copies/mL). A negative result must be combined with clinical observations, patient history, and epidemiological information. The expected result is Negative.  Fact Sheet for Patients:  BloggerCourse.com  Fact Sheet for Healthcare Providers:  SeriousBroker.it  This test is no t yet approved or cleared  by the [place] and  has been authorized for detection and/or diagnosis of SARS-CoV-2 by FDA under an Emergency Use Authorization (EUA). This EUA  will remain  in effect (meaning this test can be used) for the duration of the COVID-19 declaration under Section 564(b)(1) of the Act, 21 U.S.C.section 360bbb-3(b)(1), unless the authorization is terminated  or revoked sooner.       Influenza A by PCR NEGATIVE NEGATIVE Final   Influenza B by PCR NEGATIVE NEGATIVE Final    Comment: (NOTE) The Xpert Xpress SARS-CoV-2/FLU/RSV plus assay is intended as an aid in the diagnosis of influenza from Nasopharyngeal swab specimens and should not be used as a sole basis for treatment. Nasal washings and aspirates are unacceptable for Xpert Xpress SARS-CoV-2/FLU/RSV testing.  Fact Sheet for Patients: BloggerCourse.com  Fact Sheet for Healthcare Providers: SeriousBroker.it  This test is not yet approved or cleared by the [place] FDA and has been authorized for detection and/or diagnosis of SARS-CoV-2 by FDA under an Emergency Use Authorization (EUA). This EUA will remain in effect (meaning this test can be used) for the duration of the COVID-19 declaration under Section 564(b)(1) of the Act, 21 U.S.C. section 360bbb-3(b)(1), unless the authorization is terminated or revoked.  Performed at [facility], [address], [place], [place] [address]   Urine culture     Status: Abnormal   Collection Time: 06/20/20  7:31 PM   Specimen: In/Out Cath Urine  Result Value Ref Range Status   Specimen Description   Final    IN/OUT CATH URINE Performed at [facility], [address] [place], [place] [address]    Special Requests   Final    NONE Performed at [facility], [address] [place], [place] [address]    Culture >=100,000 COLONIES/mL ESCHERICHIA COLI (A)  Final   Report Status 06/23/2020 FINAL  Final   Organism ID, Bacteria ESCHERICHIA COLI (A)  Final      Susceptibility   Escherichia coli - MIC*    AMPICILLIN >=32  RESISTANT Resistant     CEFAZOLIN <=4 SENSITIVE Sensitive     CEFEPIME <=0.12 SENSITIVE Sensitive     CEFTRIAXONE <=0.25 SENSITIVE Sensitive     CIPROFLOXACIN >=4 RESISTANT Resistant     GENTAMICIN >=16 RESISTANT Resistant     IMIPENEM <=0.25 SENSITIVE Sensitive     NITROFURANTOIN 32 SENSITIVE Sensitive     TRIMETH/SULFA >=320 RESISTANT Resistant     AMPICILLIN/SULBACTAM >=32 RESISTANT Resistant     PIP/TAZO 16 SENSITIVE Sensitive     * >=100,000 COLONIES/mL ESCHERICHIA COLI  MRSA PCR Screening     Status: None   Collection Time: 06/21/20  7:00 PM   Specimen: Nasopharyngeal  Result Value Ref Range Status   MRSA by PCR NEGATIVE NEGATIVE Final    Comment:        The GeneXpert MRSA Assay (FDA approved for NASAL specimens only), is one component of a comprehensive MRSA colonization surveillance program. It is not intended to diagnose MRSA infection nor to guide or monitor treatment for MRSA infections. Performed at [facility], [address], [place], [place] [address]      Signed: Melina Schools Nirvaan Frett  [facility] 06/23/2020, 11:24 AM

## 2020-06-25 LAB — TYPE AND SCREEN
ABO/RH(D): O NEG
Antibody Screen: NEGATIVE
Unit division: 0

## 2020-06-25 LAB — CULTURE, BLOOD (ROUTINE X 2)
Culture: NO GROWTH
Culture: NO GROWTH
Special Requests: ADEQUATE
Special Requests: ADEQUATE

## 2020-06-25 LAB — BPAM RBC
Blood Product Expiration Date: 202204072359
Unit Type and Rh: 9500

## 2020-08-07 ENCOUNTER — Institutional Professional Consult (permissible substitution): Payer: Medicare HMO | Admitting: Cardiology

## 2020-08-09 ENCOUNTER — Telehealth: Payer: Self-pay | Admitting: *Deleted

## 2020-08-09 NOTE — Telephone Encounter (Signed)
-----   Message from Elyse Hsu sent at 08/07/2020  9:33 AM EDT ----- Called patient to RS appt on 4/25 because pt monitor results have not been received. Per RN, pt has not mailed monitor back. I called pt and told her we needed to RS this appt because we needed her monitor results for Dr. Elberta Fortis to review. Asked pt if she was still wearing monitor, she said no it was on her desk. Informed her we needed her to mail that back so we could RS her appt and make sure we have those. Pt hung up the phone. Called pt again, she said hello and hung up. Called back a third time, and told her I needed to RS her appt with Dr. Elberta Fortis and pt responded Just cancel the appt, I told you I was going to send you your stuff back. Pt hung up the phone.

## 2021-05-31 NOTE — Telephone Encounter (Signed)
This encounter was created in error - please disregard.

## 2022-08-28 ENCOUNTER — Encounter: Payer: Medicare PPO | Admitting: Urology

## 2022-09-15 ENCOUNTER — Other Ambulatory Visit: Payer: Self-pay

## 2022-09-15 ENCOUNTER — Emergency Department (HOSPITAL_BASED_OUTPATIENT_CLINIC_OR_DEPARTMENT_OTHER): Payer: Medicare PPO

## 2022-09-15 ENCOUNTER — Encounter (HOSPITAL_BASED_OUTPATIENT_CLINIC_OR_DEPARTMENT_OTHER): Payer: Self-pay | Admitting: Emergency Medicine

## 2022-09-15 ENCOUNTER — Emergency Department (HOSPITAL_BASED_OUTPATIENT_CLINIC_OR_DEPARTMENT_OTHER)
Admission: EM | Admit: 2022-09-15 | Discharge: 2022-09-15 | Disposition: A | Discharge status: Home / Self Care | Payer: Medicare PPO | Attending: Emergency Medicine | Admitting: Emergency Medicine

## 2022-09-15 DIAGNOSIS — Z79899 Other long term (current) drug therapy: Secondary | ICD-10-CM | POA: Diagnosis not present

## 2022-09-15 DIAGNOSIS — G8929 Other chronic pain: Secondary | ICD-10-CM | POA: Insufficient documentation

## 2022-09-15 DIAGNOSIS — M79604 Pain in right leg: Secondary | ICD-10-CM | POA: Insufficient documentation

## 2022-09-15 DIAGNOSIS — M7052 Other bursitis of knee, left knee: Secondary | ICD-10-CM | POA: Diagnosis not present

## 2022-09-15 DIAGNOSIS — M7051 Other bursitis of knee, right knee: Secondary | ICD-10-CM | POA: Diagnosis not present

## 2022-09-15 DIAGNOSIS — I10 Essential (primary) hypertension: Secondary | ICD-10-CM | POA: Insufficient documentation

## 2022-09-15 DIAGNOSIS — Y9389 Activity, other specified: Secondary | ICD-10-CM | POA: Diagnosis not present

## 2022-09-15 DIAGNOSIS — M545 Low back pain, unspecified: Secondary | ICD-10-CM | POA: Diagnosis not present

## 2022-09-15 DIAGNOSIS — M25561 Pain in right knee: Secondary | ICD-10-CM | POA: Diagnosis present

## 2022-09-15 NOTE — ED Provider Notes (Signed)
[facility] Provider Note   CSN: [id number] Arrival date & time: 09/15/22  1127     History  Chief Complaint  Patient presents with   Leg Pain    Heidi Green is a 67 y.o. female.   Leg Pain   67 year old female presents emergency department with complaints of right leg pain as well as low back pain.  Patient states that symptoms been present since February of this year.  States that she suffered a fracture to her femur and is being followed by [facility] medical in the outpatient setting.  She states that she was unable to get x-rays today at [facility] due to height of x-ray table and outpatient imaging was ordered and she was told to come to the med Center for sight images.  Requesting bilateral knee x-rays as well as lumbar spine x-ray.  Denies any new/repeat trauma.  Denies any weakness or sensory deficits in lower extremities, saddle anesthesia, bowel/bladder dysfunction, fever, history of IV drug use, prolonged corticosteroid use.  Does state that she has had persistent pain since February that remains unchanged.  Denies any other complaint.  Past medical history significant for osteoarthritis, chronic low back pain, GERD, obesity, hypertension, hyperlipidemia, CVA, OSA,  Home Medications Prior to Admission medications   Medication Sig Start Date End Date Taking? Authorizing Provider  albuterol (VENTOLIN HFA) 108 (90 Base) MCG/ACT inhaler Inhale 2 puffs into the lungs every 6 (six) hours as needed for wheezing or shortness of breath. 12/22/19   Rhetta Mura, MD  diclofenac Sodium (VOLTAREN) 1 % GEL Apply 2 g topically 4 (four) times daily. 05/24/20   [provider]  esomeprazole (NEXIUM) 40 MG capsule Take 40 mg by mouth 2 (two) times daily before a meal. 08/18/18   [provider]  ezetimibe (ZETIA) 10 MG tablet Take 10 mg by mouth daily with supper. 11/11/19   [provider]  famotidine (PEPCID) 10 MG tablet  Take 10 mg by mouth at bedtime.    [provider]  FEROSUL 325 (65 Fe) MG tablet Take 325 mg by mouth daily with breakfast. 10/15/19   [provider]  folic acid (FOLVITE) 1 MG tablet Take 1 tablet (1 mg total) by mouth daily. 06/06/20   Rodolph Bong, MD  hydrochlorothiazide (HYDRODIURIL) 25 MG tablet Take 1 tablet (25 mg total) by mouth daily. 06/24/20 09/22/20  Lorin Glass, MD  HYDROcodone-acetaminophen (NORCO) 10-325 MG tablet Take 1 tablet by mouth every 4 (four) hours as needed for moderate pain or severe pain. Patient taking differently: Take 1 tablet by mouth in the morning, at noon, in the evening, and at bedtime. 06/05/20   Swinteck, Arlys John, MD  lisinopril (ZESTRIL) 30 MG tablet Take 1 tablet (30 mg total) by mouth daily. 06/08/20   Rodolph Bong, MD  oxybutynin (DITROPAN-XL) 5 MG 24 hr tablet Take 5 mg by mouth daily. 01/08/20   [provider]  rOPINIRole (REQUIP) 2 MG tablet Take 2 mg by mouth 2 (two) times daily.     [provider]  senna-docusate (SENOKOT-S) 8.6-50 MG tablet Take 1 tablet by mouth 2 (two) times daily. Patient taking differently: Take 1 tablet by mouth daily as needed for mild constipation. 06/05/20   Rodolph Bong, MD  vitamin B-12 (CYANOCOBALAMIN) 1000 MCG tablet Take 1 tablet (1,000 mcg total) by mouth daily. 06/05/20   Rodolph Bong, MD  Vitamin D, Ergocalciferol, (DRISDOL) 50000 units CAPS capsule Take 50,000 Units  by mouth every Sunday.  10/31/17   [provider]  zolpidem (AMBIEN) 10 MG tablet Take 5 mg by mouth at bedtime. 12/10/19   [provider]      Allergies    Buprenorphine, Garamycin [gentamicin sulfate], Other, Penicillins, Atorvastatin, Norvasc [amlodipine besylate], and Rosuvastatin    Review of Systems   Review of Systems  All other systems reviewed and are negative.   Physical Exam Updated Vital Signs BP (!) 152/91   Pulse 77   Temp 98.5 F (36.9 C) (Oral)   Resp 18    Ht 5\' 9"  (1.753 m)   Wt 112.9 kg   SpO2 90%   BMI 36.77 kg/m  Physical Exam Vitals and nursing note reviewed.  Constitutional:      General: She is not in acute distress.    Appearance: She is well-developed.  HENT:     Head: Normocephalic and atraumatic.  Eyes:     Conjunctiva/sclera: Conjunctivae normal.  Cardiovascular:     Rate and Rhythm: Normal rate and regular rhythm.     Heart sounds: No murmur heard. Pulmonary:     Effort: Pulmonary effort is normal. No respiratory distress.     Breath sounds: Normal breath sounds.  Abdominal:     Palpations: Abdomen is soft.     Tenderness: There is no abdominal tenderness.  Musculoskeletal:        General: No swelling.     Cervical back: Neck supple.     Comments: Patient with tenderness palpation midline of low back as well as paraspinal tenderness more focused on the right than the left.  No tenderness palpation of hips bilaterally but diffuse tenderness of right knee.  Patient with limited range of motion of flexion of right knee secondary to pain.  Pedal pulses 2+ bilaterally.  No sensory deficits along major nerve distributions of the lower extremities.  Muscular strength symmetric for bilateral knees in flexion/extension, ankle dorsi/plantarflexion.  Skin:    General: Skin is warm and dry.     Capillary Refill: Capillary refill takes less than 2 seconds.  Neurological:     Mental Status: She is alert.  Psychiatric:        Mood and Affect: Mood normal.     ED Results / Procedures / Treatments   Labs (all labs ordered are listed, but only abnormal results are displayed) Labs Reviewed - No data to display  EKG None  Radiology DG Lumbar Spine Complete  Result Date: 09/15/2022 CLINICAL DATA:  Chronic and acute pain EXAM: LUMBAR SPINE - COMPLETE 4+ VIEW COMPARISON:  None Available. FINDINGS: No recent fracture is seen. There is fusion of bodies of L4 and L5 vertebrae. Degenerative changes are noted, most severe at L3-L4  level. There is straightening of lordosis. There is mild levoscoliosis. Arterial calcifications are seen. IMPRESSION: No recent fracture is seen in lumbar spine. Lumbar spondylosis, most severe at L3-L4 level. There is fusion of L4 and L5 vertebrae. Arteriosclerosis. Electronically Signed   By: Ernie Avena M.D.   On: 09/15/2022 14:53   DG Knee Complete 4 Views Right  Result Date: 09/15/2022 CLINICAL DATA:  Pain EXAM: RIGHT KNEE - COMPLETE 4+ VIEW COMPARISON:  06/27/2022 FINDINGS: Lays previous internal fixation with intramedullary rods in right femur and right tibia. Recent fracture is seen in the distal shaft of femur. There is no significant change in alignment. There is possible small effusion in suprapatellar bursa. Arterial calcifications are seen in soft tissues. IMPRESSION: Previous internal fixation of fracture  of distal shaft off right femur with intramedullary rod. No significant interval changes are noted in comparison with the study of 06/27/2022. Effusion is seen in suprapatellar bursa. Arteriosclerosis. Electronically Signed   By: Ernie Avena M.D.   On: 09/15/2022 14:47   DG Knee Complete 4 Views Left  Result Date: 09/15/2022 CLINICAL DATA:  Pain EXAM: LEFT KNEE - COMPLETE 4+ VIEW COMPARISON:  05/15/2020 FINDINGS: No recent fracture or dislocation is seen. There is moderate effusion in suprapatellar bursa. Degenerative changes are noted with bony spurs in medial, lateral and patellofemoral compartments. Degenerative changes are more severe inlet lateral and patellofemoral compartments. Scattered arterial calcifications are seen in soft tissues. IMPRESSION: No recent fracture or dislocation is seen in left knee. Marked degenerative changes are noted. There is moderate effusion in suprapatellar bursa. Electronically Signed   By: Ernie Avena M.D.   On: 09/15/2022 14:43    Procedures Procedures    Medications Ordered in ED Medications - No data to display  ED Course/  Medical Decision Making/ A&P                             Medical Decision Making Amount and/or Complexity of Data Reviewed Radiology: ordered.   This patient presents to the ED for concern of low back pain, bilateral knee pain, this involves an extensive number of treatment options, and is a complaint that carries with it a high risk of complications and morbidity.  The differential diagnosis includes fracture, dislocation, strain/sprain, osteoarthritis, septic arthritis, spinal cord injury/compression, gout,   Co morbidities that complicate the patient evaluation  See HPI   Additional history obtained:  Additional history obtained from EMR External records from outside source obtained and reviewed including hospital records   Lab Tests:  N/a   Imaging Studies ordered:  I ordered imaging studies including lumbar x-ray, bilateral knee x-ray I independently visualized and interpreted imaging which showed  Lumbar x-ray: No acute fracture.  Lumbar spondylosis most severe at L3-L4.  Fusion of L4-L5.  Atherosclerosis Left knee x-ray: No acute fracture.  Marked degenerative changes.  Effusion of suprapatellar bursa Right knee x-ray: Previous internal fixation of distal shaft of right femur with IM rod.  No interval changes.  Effusion of suprapatella bursa I agree with the radiologist interpretation  Cardiac Monitoring: / EKG:  The patient was maintained on a cardiac monitor.  I personally viewed and interpreted the cardiac monitored which showed an underlying rhythm of: Sinus rhythm   Consultations Obtained:  N/a   Problem List / ED Course / Critical interventions / Medication management  Chronic back pain.  Chronic knee pain Reevaluation of the patient showed that the patient stayed the same I have reviewed the patients home medicines and have made adjustments as needed   Social Determinants of Health:  Former cigarette use.  Denies illicit drug use.   Test /  Admission - Considered:  Chronic back pain.  Chronic knee pain Vitals signs significant for hypertension with blood pressure 152/91. Otherwise within normal range and stable throughout visit. Imaging studies significant for: See above 67 year old female presents emergency department with request of x-ray imaging.  Was sent by primary care to get imaging performed in the outpatient setting of which were obtained.  No acute abnormality appreciated.  Patient with chronic back pain as well as chronic bilateral knee pain without acute change but with some swelling of bilateral suprapatella bursa which could exacerbate known chronic bilateral knee  pain.  No evidence of ischemic limb or swelling indicative of DVT.  No overlying skin abnormalities appreciated full concerning for secondary skin infection.  Patient without red flag signs on HPI or PE so low suspicion for acute spinal cord compression.  Patient already on multiple pain medications in the outpatient setting and followed by pain management through [facility] medical.  Will not adjust patient's medications at this time.  Patient recommended follow-up with orthopedic specialist in the outpatient setting for reevaluation.  Patient overall well-appearing, afebrile in no acute distress.  Further workup deemed unnecessary at this time while emergency department.  Treatment plan discussed at length with patient and she acknowledged understanding was agreeable to said plan. Worrisome signs and symptoms were discussed with the patient, and the patient acknowledged understanding to return to the ED if noticed. Patient was stable upon discharge.          Final Clinical Impression(s) / ED Diagnoses Final diagnoses:  Suprapatellar bursitis of both knees  Chronic back pain, unspecified back location, unspecified back pain laterality  Chronic pain of both knees    Rx / DC Orders ED Discharge Orders     None         Peter Garter, [place] 09/15/22  1717    Ernie Avena, MD 09/16/22 1513

## 2022-09-15 NOTE — Discharge Instructions (Addendum)
Note the workup today was overall reassuring.  No evidence of acute fracture/dislocation.  You have swelling to bursal sacs above your both left and right knee which could be causing increasing pain.  Continue outpatient pain management as prescribed by [facility] medical.  Also recommend follow-up with orthopedic specialist for reevaluation.  Please do not hesitate to return to emergency department for worrisome signs and symptoms we discussed become apparent.

## 2022-09-15 NOTE — ED Triage Notes (Signed)
Pt goes to pain management at [facility] and was seen today for chronic leg pain.  Pt states she needed xrays and she couldn't get on their table.  She needs bilateral knee xrays and L-spine xray.

## 2022-10-03 ENCOUNTER — Encounter: Payer: Medicare PPO | Admitting: Urology

## 2023-09-08 ENCOUNTER — Other Ambulatory Visit: Payer: Self-pay

## 2023-09-08 ENCOUNTER — Emergency Department (HOSPITAL_BASED_OUTPATIENT_CLINIC_OR_DEPARTMENT_OTHER)
Admission: EM | Admit: 2023-09-08 | Discharge: 2023-09-08 | Disposition: A | Discharge status: Home / Self Care | Attending: Emergency Medicine | Admitting: Emergency Medicine

## 2023-09-08 ENCOUNTER — Encounter (HOSPITAL_BASED_OUTPATIENT_CLINIC_OR_DEPARTMENT_OTHER): Payer: Self-pay | Admitting: Emergency Medicine

## 2023-09-08 DIAGNOSIS — Z23 Encounter for immunization: Secondary | ICD-10-CM | POA: Insufficient documentation

## 2023-09-08 DIAGNOSIS — S61412A Laceration without foreign body of left hand, initial encounter: Secondary | ICD-10-CM | POA: Diagnosis not present

## 2023-09-08 DIAGNOSIS — W230XXA Caught, crushed, jammed, or pinched between moving objects, initial encounter: Secondary | ICD-10-CM | POA: Diagnosis not present

## 2023-09-08 DIAGNOSIS — S6992XA Unspecified injury of left wrist, hand and finger(s), initial encounter: Secondary | ICD-10-CM | POA: Diagnosis present

## 2023-09-08 MED ORDER — TETANUS-DIPHTH-ACELL PERTUSSIS 5-2.5-18.5 LF-MCG/0.5 IM SUSY
0.5000 mL | PREFILLED_SYRINGE | Freq: Once | INTRAMUSCULAR | Status: AC
  Administered 2023-09-08: 0.5 mL via INTRAMUSCULAR
  Filled 2023-09-08: qty 0.5

## 2023-09-08 NOTE — ED Notes (Signed)
 Pts hand wrapped with non-adherent dressing at this time.   Reviewed D/C information with the patient, pt verbalized understanding. No additional concerns at this time.

## 2023-09-08 NOTE — ED Triage Notes (Signed)
 Pt presents to the ED via POV with complaints of <1 superficial cut to her L hand near the base of her thumb that occurred after pinching it on her wheelchair. Bleeding has the ceased at this time. Controlled PTA with bandage applied by pt and support person. A&Ox4 at this time. Denies being on blood thinners nor taking baby ASA.

## 2023-09-08 NOTE — ED Provider Notes (Signed)
 [place]  Provider Note  CSN: [id number] Arrival date & time: 09/08/23 1308  History Chief Complaint  Patient presents with   Laceration    Heidi Green is a 68 y.o. female here with family for evaluation of laceration on L hand. She reports she got the area pinched in the retractable arm mechanism on her bedside commode just prior to arrival. States it was bleeding heavily at home prior to her applying a dressing. Not on blood thinners. Unsure of last TDAP.    Home Medications Prior to Admission medications   Medication Sig Start Date End Date Taking? Authorizing Provider  albuterol  (VENTOLIN  HFA) 108 (90 Base) MCG/ACT inhaler Inhale 2 puffs into the lungs every 6 (six) hours as needed for wheezing or shortness of breath. 12/22/19   Samtani, Jai-Gurmukh, MD  diclofenac  Sodium (VOLTAREN ) 1 % GEL Apply 2 g topically 4 (four) times daily. 05/24/20   [provider]  esomeprazole (NEXIUM) 40 MG capsule Take 40 mg by mouth 2 (two) times daily before a meal. 08/18/18   [provider]  ezetimibe  (ZETIA ) 10 MG tablet Take 10 mg by mouth daily with supper. 11/11/19   [provider]  famotidine  (PEPCID ) 10 MG tablet Take 10 mg by mouth at bedtime.    [provider]  FEROSUL 325 (65 Fe) MG tablet Take 325 mg by mouth daily with breakfast. 10/15/19   [provider]  folic acid  (FOLVITE ) 1 MG tablet Take 1 tablet (1 mg total) by mouth daily. 06/06/20   Armenta Landau, MD  hydrochlorothiazide  (HYDRODIURIL ) 25 MG tablet Take 1 tablet (25 mg total) by mouth daily. 06/24/20 09/22/20  Hoyt Macleod, MD  HYDROcodone -acetaminophen  (NORCO) 10-325 MG tablet Take 1 tablet by mouth every 4 (four) hours as needed for moderate pain or severe pain. Patient taking differently: Take 1 tablet by mouth in the morning, at noon, in the evening, and at bedtime. 06/05/20   Swinteck, Polly Brink, MD  lisinopril  (ZESTRIL ) 30 MG tablet Take 1  tablet (30 mg total) by mouth daily. 06/08/20   Armenta Landau, MD  oxybutynin  (DITROPAN -XL) 5 MG 24 hr tablet Take 5 mg by mouth daily. 01/08/20   [provider]  rOPINIRole  (REQUIP ) 2 MG tablet Take 2 mg by mouth 2 (two) times daily.     [provider]  senna-docusate (SENOKOT-S) 8.6-50 MG tablet Take 1 tablet by mouth 2 (two) times daily. Patient taking differently: Take 1 tablet by mouth daily as needed for mild constipation. 06/05/20   Armenta Landau, MD  vitamin B-12 (CYANOCOBALAMIN ) 1000 MCG tablet Take 1 tablet (1,000 mcg total) by mouth daily. 06/05/20   Armenta Landau, MD  Vitamin D , Ergocalciferol , (DRISDOL ) 50000 units CAPS capsule Take 50,000 Units by mouth every Sunday.  10/31/17   [provider]  zolpidem  (AMBIEN ) 10 MG tablet Take 5 mg by mouth at bedtime. 12/10/19   [provider]     Allergies    Buprenorphine, Garamycin [gentamicin sulfate], Other, Penicillins, Atorvastatin , Norvasc  [amlodipine  besylate], and Rosuvastatin   Review of Systems   Review of Systems Please see HPI for pertinent positives and negatives  Physical Exam BP (!) 174/91   Pulse 88   Temp 98 F (36.7 C)   Resp 16   Ht 5\' 9"  (1.753 m)   Wt 113.2 kg   SpO2 94%   BMI 36.85 kg/m   Physical Exam Vitals and nursing note reviewed.  HENT:     Head: Normocephalic.     Nose: Nose normal.  Eyes:     Extraocular Movements: Extraocular movements intact.  Pulmonary:     Effort: Pulmonary effort is normal.  Musculoskeletal:        General: Normal range of motion.     Cervical back: Neck supple.  Skin:    Findings: No rash (on exposed skin).     Comments: <1cm superficial laceration to L hand over the dorsal 1st webspace without active bleeding.   Neurological:     Mental Status: She is alert and oriented to person, place, and time.  Psychiatric:        Mood and Affect: Mood normal.     ED Results / Procedures / Treatments    EKG None  Procedures .Laceration Repair  Date/Time: 09/08/2023 6:33 AM  Performed by: Charmayne Cooper, MD Authorized by: Charmayne Cooper, MD   Consent:    Consent obtained:  Verbal   Consent given by:  Patient Anesthesia:    Anesthesia method:  None Laceration details:    Location:  Hand   Hand location:  L hand, dorsum   Length (cm):  0.5 Treatment:    Area cleansed with:  Saline Skin repair:    Repair method:  Tissue adhesive Approximation:    Approximation:  Close Repair type:    Repair type:  Simple Post-procedure details:    Dressing:  Non-adherent dressing   Procedure completion:  Tolerated well, no immediate complications   Medications Ordered in the ED Medications  Tdap (BOOSTRIX) injection 0.5 mL (has no administration in time range)    Initial Impression and Plan  Patient here with superficial hand wound, no active bleeding. Repaired as above with dermabond. TDAP updated. Wound care instructions given.   ED Course       MDM Rules/Calculators/A&P Medical Decision Making Problems Addressed: Laceration of left hand without foreign body, initial encounter: acute illness or injury  Risk Prescription drug management.     Final Clinical Impression(s) / ED Diagnoses Final diagnoses:  Laceration of left hand without foreign body, initial encounter    Rx / DC Orders ED Discharge Orders     None        Charmayne Cooper, MD 09/08/23 [phone]
